# Patient Record
Sex: Male | Born: 1945
Health system: Southern US, Community
[De-identification: ages and names within clinical notes are randomized; demographics above are authoritative.]

## PROBLEM LIST (undated history)

## (undated) DIAGNOSIS — I219 Acute myocardial infarction, unspecified: Secondary | ICD-10-CM

## (undated) DIAGNOSIS — Z8669 Personal history of other diseases of the nervous system and sense organs: Secondary | ICD-10-CM

## (undated) DIAGNOSIS — I77819 Aortic ectasia, unspecified site: Secondary | ICD-10-CM

## (undated) DIAGNOSIS — M5136 Other intervertebral disc degeneration, lumbar region: Secondary | ICD-10-CM

## (undated) DIAGNOSIS — I739 Peripheral vascular disease, unspecified: Secondary | ICD-10-CM

## (undated) DIAGNOSIS — Z9989 Dependence on other enabling machines and devices: Secondary | ICD-10-CM

## (undated) DIAGNOSIS — E781 Pure hyperglyceridemia: Secondary | ICD-10-CM

## (undated) DIAGNOSIS — I255 Ischemic cardiomyopathy: Secondary | ICD-10-CM

## (undated) DIAGNOSIS — G4730 Sleep apnea, unspecified: Secondary | ICD-10-CM

## (undated) DIAGNOSIS — I2510 Atherosclerotic heart disease of native coronary artery without angina pectoris: Secondary | ICD-10-CM

## (undated) DIAGNOSIS — I4819 Other persistent atrial fibrillation: Secondary | ICD-10-CM

## (undated) DIAGNOSIS — M51369 Other intervertebral disc degeneration, lumbar region without mention of lumbar back pain or lower extremity pain: Secondary | ICD-10-CM

## (undated) DIAGNOSIS — N200 Calculus of kidney: Secondary | ICD-10-CM

## (undated) DIAGNOSIS — K219 Gastro-esophageal reflux disease without esophagitis: Secondary | ICD-10-CM

## (undated) DIAGNOSIS — I6529 Occlusion and stenosis of unspecified carotid artery: Secondary | ICD-10-CM

## (undated) DIAGNOSIS — M109 Gout, unspecified: Secondary | ICD-10-CM

## (undated) DIAGNOSIS — N529 Male erectile dysfunction, unspecified: Secondary | ICD-10-CM

## (undated) DIAGNOSIS — Z87442 Personal history of urinary calculi: Secondary | ICD-10-CM

## (undated) DIAGNOSIS — I340 Nonrheumatic mitral (valve) insufficiency: Secondary | ICD-10-CM

## (undated) DIAGNOSIS — M1990 Unspecified osteoarthritis, unspecified site: Secondary | ICD-10-CM

## (undated) DIAGNOSIS — E782 Mixed hyperlipidemia: Secondary | ICD-10-CM

## (undated) DIAGNOSIS — R0789 Other chest pain: Secondary | ICD-10-CM

## (undated) DIAGNOSIS — G459 Transient cerebral ischemic attack, unspecified: Secondary | ICD-10-CM

## (undated) DIAGNOSIS — K635 Polyp of colon: Secondary | ICD-10-CM

## (undated) DIAGNOSIS — I10 Essential (primary) hypertension: Secondary | ICD-10-CM

## (undated) DIAGNOSIS — I482 Chronic atrial fibrillation, unspecified: Secondary | ICD-10-CM

## (undated) DIAGNOSIS — F5104 Psychophysiologic insomnia: Secondary | ICD-10-CM

## (undated) DIAGNOSIS — N400 Enlarged prostate without lower urinary tract symptoms: Secondary | ICD-10-CM

## (undated) DIAGNOSIS — G4733 Obstructive sleep apnea (adult) (pediatric): Secondary | ICD-10-CM

## (undated) DIAGNOSIS — N183 Chronic kidney disease, stage 3 unspecified: Secondary | ICD-10-CM

## (undated) DIAGNOSIS — I499 Cardiac arrhythmia, unspecified: Secondary | ICD-10-CM

## (undated) HISTORY — DX: Ischemic cardiomyopathy: I25.5

## (undated) HISTORY — DX: Chronic atrial fibrillation, unspecified: I48.20

## (undated) HISTORY — DX: Aortic ectasia, unspecified site: I77.819

## (undated) HISTORY — DX: Gout, unspecified: M10.9

## (undated) HISTORY — DX: Polyp of colon: K63.5

## (undated) HISTORY — DX: Atherosclerotic heart disease of native coronary artery without angina pectoris: I25.10

## (undated) HISTORY — DX: Nonrheumatic mitral (valve) insufficiency: I34.0

## (undated) HISTORY — DX: Peripheral vascular disease, unspecified: I73.9

## (undated) HISTORY — DX: Occlusion and stenosis of unspecified carotid artery: I65.29

## (undated) HISTORY — DX: Obstructive sleep apnea (adult) (pediatric): G47.33

## (undated) HISTORY — DX: Other intervertebral disc degeneration, lumbar region without mention of lumbar back pain or lower extremity pain: M51.369

## (undated) HISTORY — DX: Psychophysiologic insomnia: F51.04

## (undated) HISTORY — DX: Calculus of kidney: N20.0

## (undated) HISTORY — PX: CARDIOVERSION: SHX1299

## (undated) HISTORY — DX: Pure hyperglyceridemia: E78.1

## (undated) HISTORY — PX: KIDNEY STONE SURGERY: SHX686

## (undated) HISTORY — DX: Other chest pain: R07.89

## (undated) HISTORY — DX: Unspecified osteoarthritis, unspecified site: M19.90

## (undated) HISTORY — DX: Essential (primary) hypertension: I10

## (undated) HISTORY — DX: Other intervertebral disc degeneration, lumbar region: M51.36

## (undated) HISTORY — PX: TONSILLECTOMY: SUR1361

## (undated) HISTORY — DX: Gastro-esophageal reflux disease without esophagitis: K21.9

## (undated) HISTORY — PX: FINGER SURGERY: SHX640

## (undated) HISTORY — DX: Male erectile dysfunction, unspecified: N52.9

## (undated) HISTORY — DX: Dependence on other enabling machines and devices: Z99.89

## (undated) HISTORY — DX: Other persistent atrial fibrillation: I48.19

## (undated) HISTORY — DX: Mixed hyperlipidemia: E78.2

---

## 1997-08-30 ENCOUNTER — Other Ambulatory Visit: Admission: RE | Admit: 1997-08-30 | Discharge: 1997-08-30 | Payer: Self-pay | Admitting: Internal Medicine

## 1999-05-09 ENCOUNTER — Ambulatory Visit (HOSPITAL_BASED_OUTPATIENT_CLINIC_OR_DEPARTMENT_OTHER): Admission: RE | Admit: 1999-05-09 | Discharge: 1999-05-09 | Payer: Self-pay | Admitting: Orthopedic Surgery

## 1999-06-04 ENCOUNTER — Emergency Department (HOSPITAL_COMMUNITY): Admission: EM | Admit: 1999-06-04 | Discharge: 1999-06-04 | Payer: Self-pay | Admitting: Emergency Medicine

## 1999-06-04 ENCOUNTER — Encounter: Payer: Self-pay | Admitting: Emergency Medicine

## 2000-09-23 ENCOUNTER — Encounter (INDEPENDENT_AMBULATORY_CARE_PROVIDER_SITE_OTHER): Payer: Self-pay

## 2000-09-23 ENCOUNTER — Ambulatory Visit (HOSPITAL_COMMUNITY): Admission: RE | Admit: 2000-09-23 | Discharge: 2000-09-23 | Payer: Self-pay | Admitting: Gastroenterology

## 2001-10-27 ENCOUNTER — Ambulatory Visit (HOSPITAL_COMMUNITY)
Admission: RE | Admit: 2001-10-27 | Discharge: 2001-10-27 | Payer: Self-pay | Admitting: Physical Medicine and Rehabilitation

## 2001-10-27 ENCOUNTER — Encounter: Payer: Self-pay | Admitting: Physical Medicine and Rehabilitation

## 2004-01-31 ENCOUNTER — Emergency Department (HOSPITAL_COMMUNITY): Admission: EM | Admit: 2004-01-31 | Discharge: 2004-01-31 | Payer: Self-pay | Admitting: *Deleted

## 2004-01-31 IMAGING — CT CT ABDOMEN W/O CM
1 series · 15 of 32 positions shown, 19 images · non-contrast
Comparison: None. 
CT OF THE ABDOMEN WITHOUT CONTRAST:

CLINICAL DATA: Abdominal and pelvic pain, left flank pain.
TECHNIQUE: Multidetector helical CT scanning obtained through the abdomen and pelvis.

[Series 2: routine abdomen · axial · 0.73mm/px · z∈[-484,-24]mm · 15 of 103 slices shown, 19 images]
[im 7/103  soft-tissue]
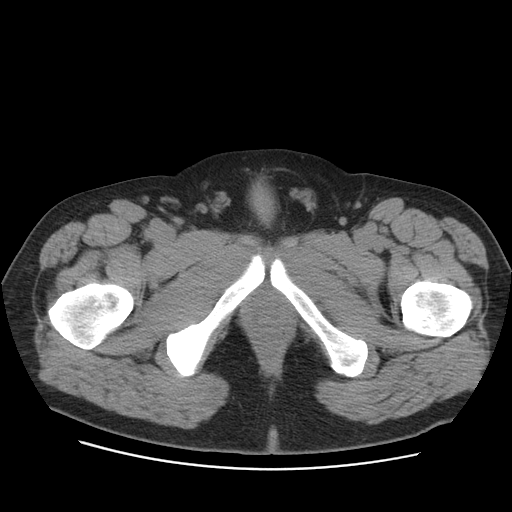
[im 7/103  bone]
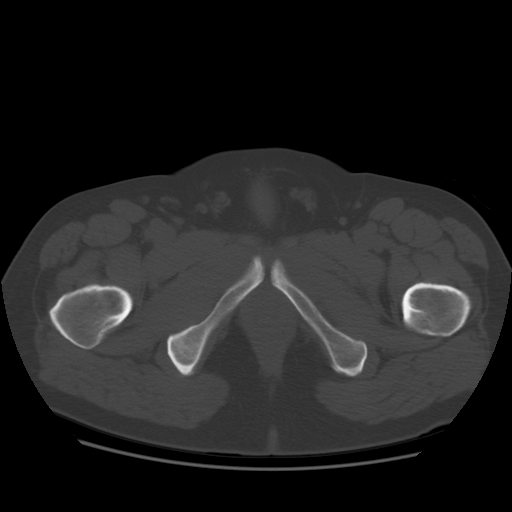
[im 14/103  soft-tissue]
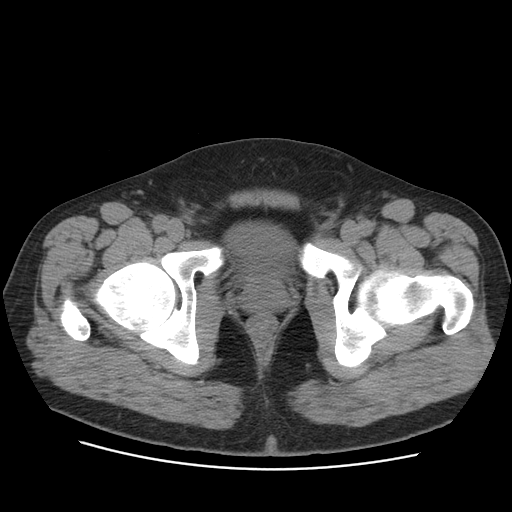
[im 20/103  soft-tissue]
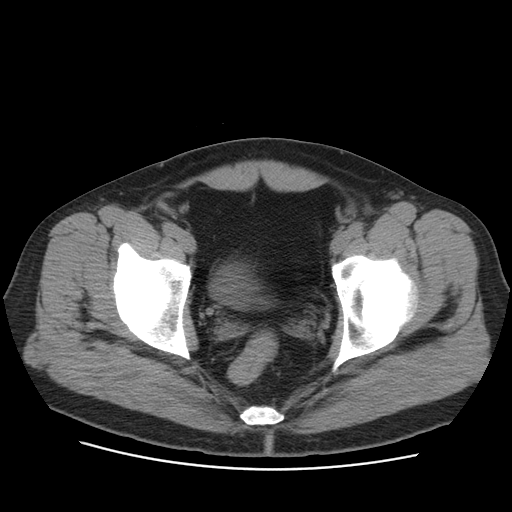
[im 30/103  soft-tissue]
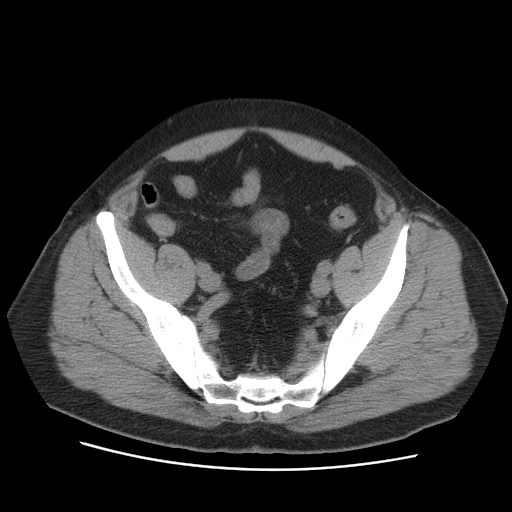
[im 37/103  soft-tissue]
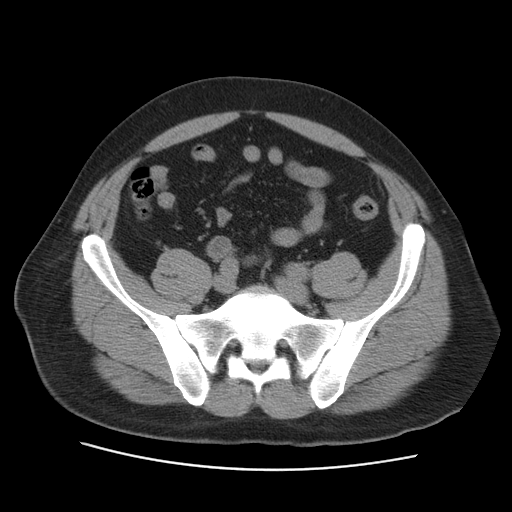
[im 43/103  soft-tissue]
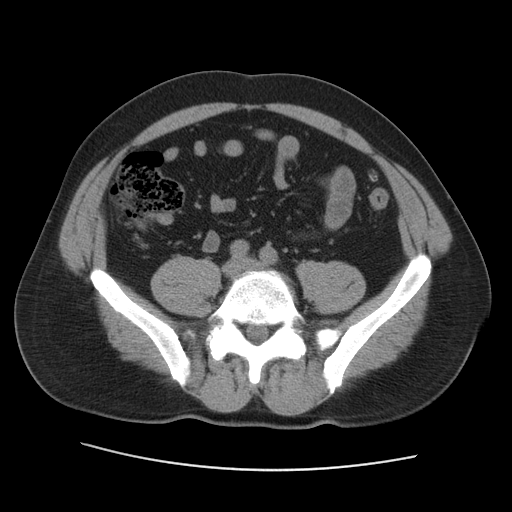
[im 53/103  soft-tissue]
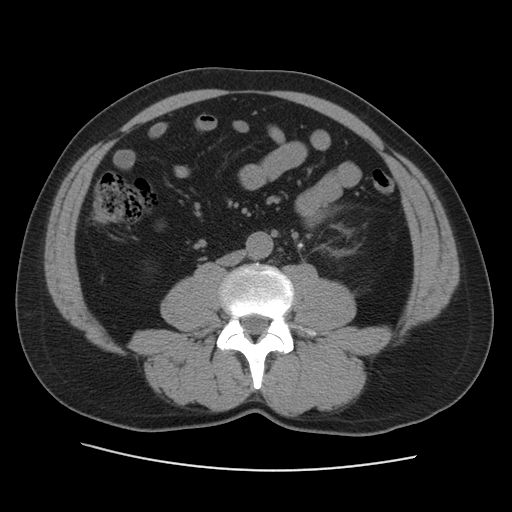
[im 60/103  soft-tissue]
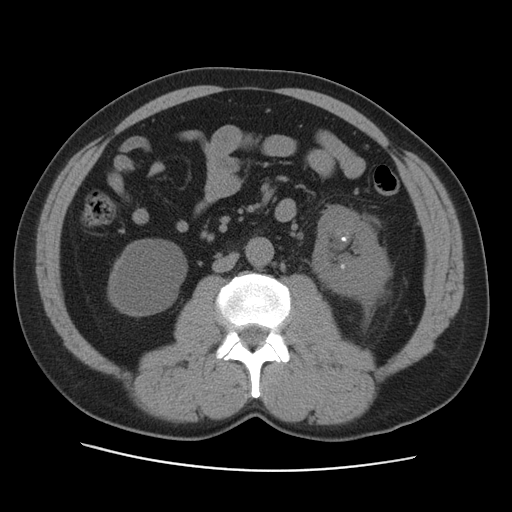
[im 66/103  soft-tissue]
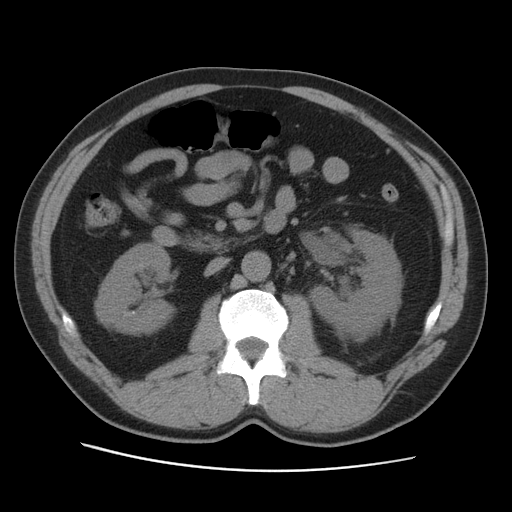
[im 66/103  bone]
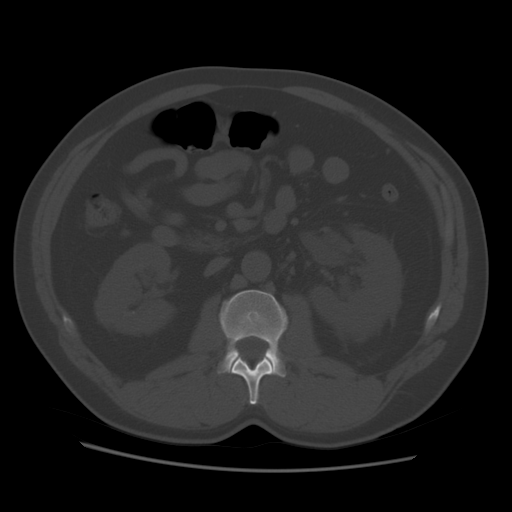
[im 73/103  soft-tissue]
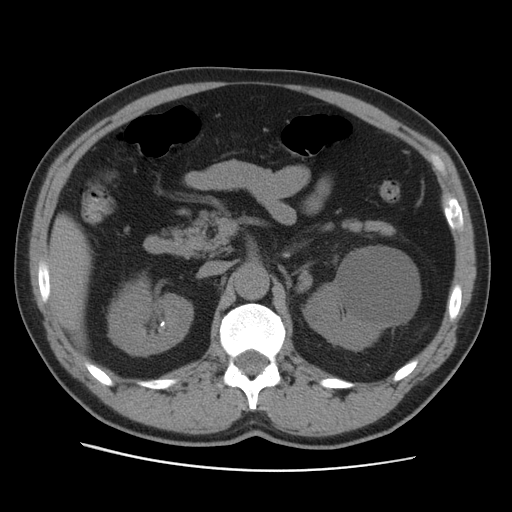
[im 83/103  soft-tissue]
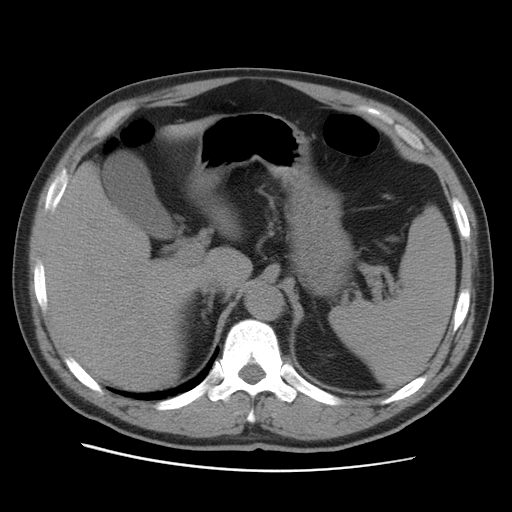
[im 89/103  soft-tissue]
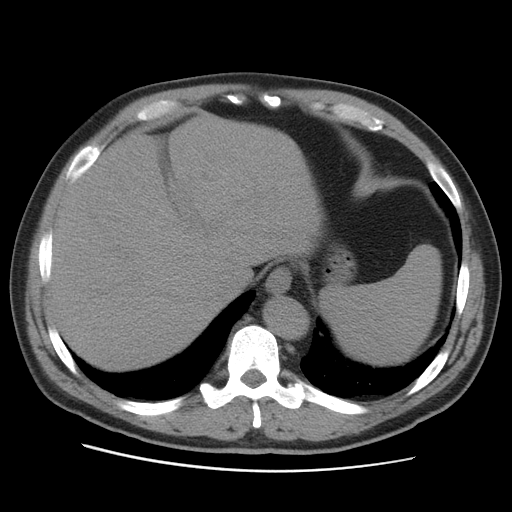
[im 89/103  lung]
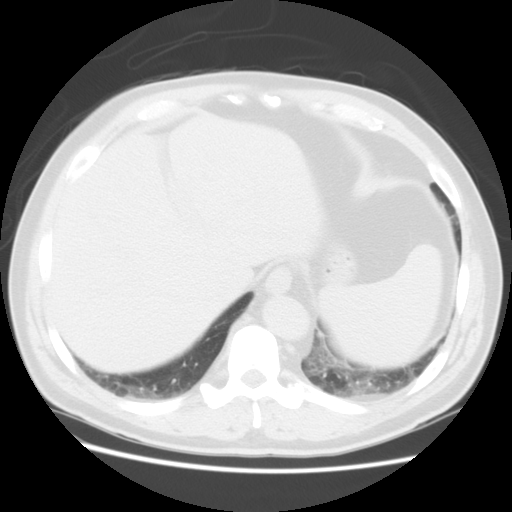
[im 93/103  lung]
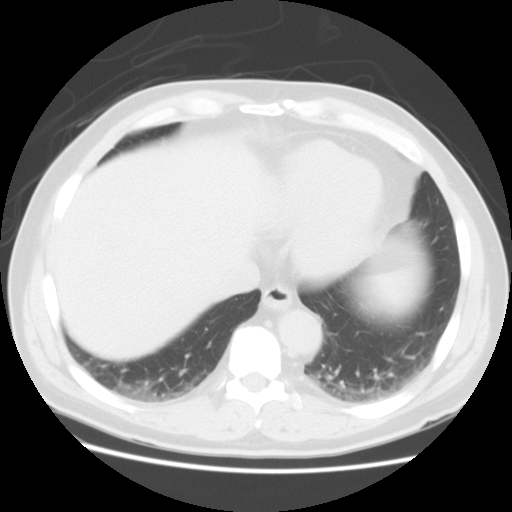
[im 96/103  soft-tissue]
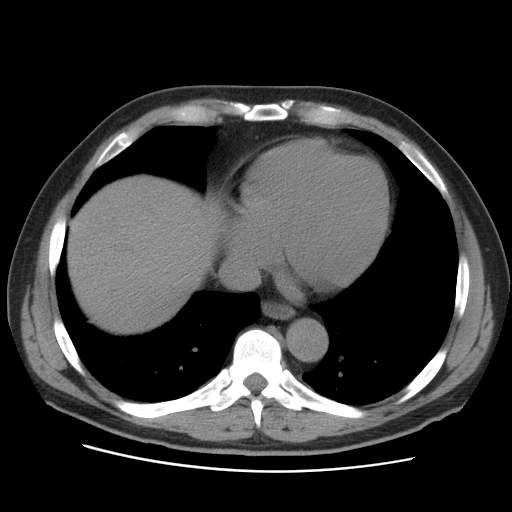
[im 96/103  lung]
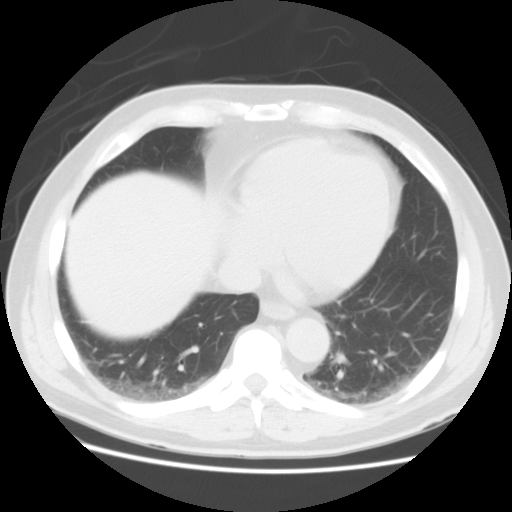
[im 99/103  lung]
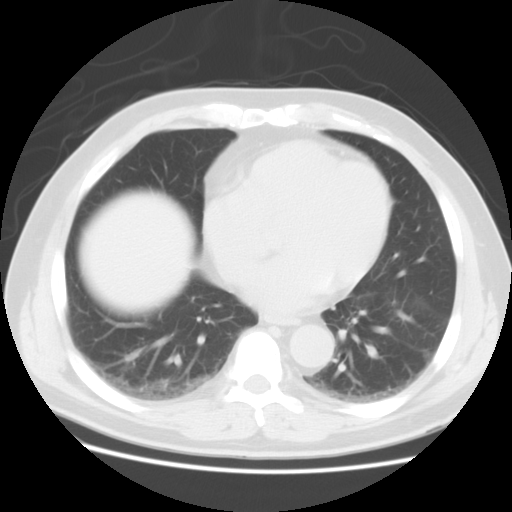

[15 of 32 positions shown; findings below may reference images not displayed]

FINDINGS: Mild bibasilar atelectasis is identified.  There is moderate left hydronephrosis with perinephric stranding caused by a 3 x 4 mm left UPJ calculus.  Multiple bilateral non-obstructing intrarenal calculi are noted as well as bilateral low density renal lesions probably representing cysts.  The largest lesions measure 6.5 cm in the left upper kidney and 6.5 x 5 cm in the right lower kidney.  The pancreas, spleen, adrenal glands, liver, and gallbladder are unremarkable.  No evidence of free fluid, enlarged lymph nodes or abdominal aortic aneurysm.  The visualized bowel is unremarkable.  Please note that parenchymal abnormalities may be missed as intravenous contrast was not administered.  Visualized bones unremarkable.
IMPRESSION: 1.   3 x 4 mm left UPJ calculus causing moderate left hydronephrosis.  
2.  Bilateral non-obstructing intrarenal calculi and probable bilateral renal cysts. 
CT OF THE PELVIS WITHOUT CONTRAST:
Visualized bowel and bladder are unremarkable.  No evidence of free fluid, enlarged lymph nodes or masses.  Slight increased density in the bladder probably represents blood.
IMPRESSION: No acute abnormality.

## 2004-02-04 ENCOUNTER — Emergency Department (HOSPITAL_COMMUNITY): Admission: EM | Admit: 2004-02-04 | Discharge: 2004-02-04 | Payer: Self-pay | Admitting: Emergency Medicine

## 2005-05-01 ENCOUNTER — Encounter: Admission: RE | Admit: 2005-05-01 | Discharge: 2005-05-01 | Payer: Self-pay | Admitting: Internal Medicine

## 2005-05-01 IMAGING — US US ABDOMEN COMPLETE
1 series · 13 of 25 positions shown · non-contrast
Comparison: [HOSPITAL] abdominopelvic CT urogram, [DATE].

CLINICAL DATA: Chest, right upper quadrant abdominal pain.
 ABDOMEN ULTRASOUND:
TECHNIQUE: Complete abdominal ultrasound examination was performed including evaluation of the liver, gallbladder, bile ducts, pancreas, kidneys, spleen, IVC, and abdominal aorta.  Study is limited due to body habitus and gastrointestinal gas obscuration.

[Series 1: us abdomen complete · 0.32mm/px · 13 of 109 slices shown]
[im 1/109]
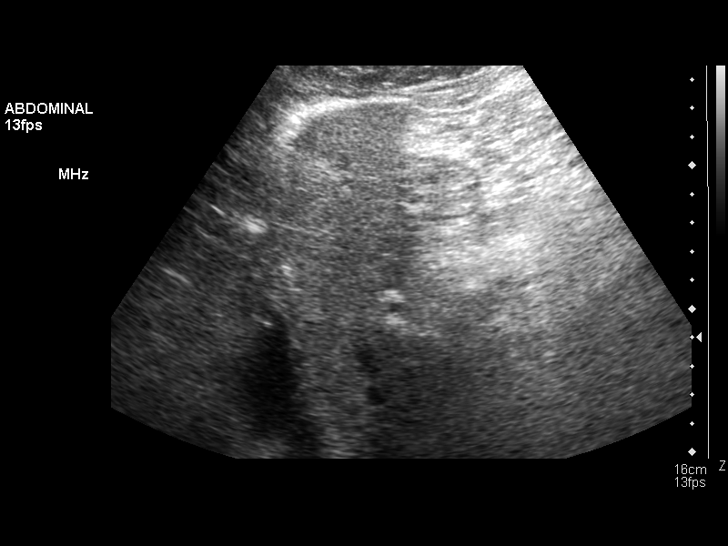
[im 10/109]
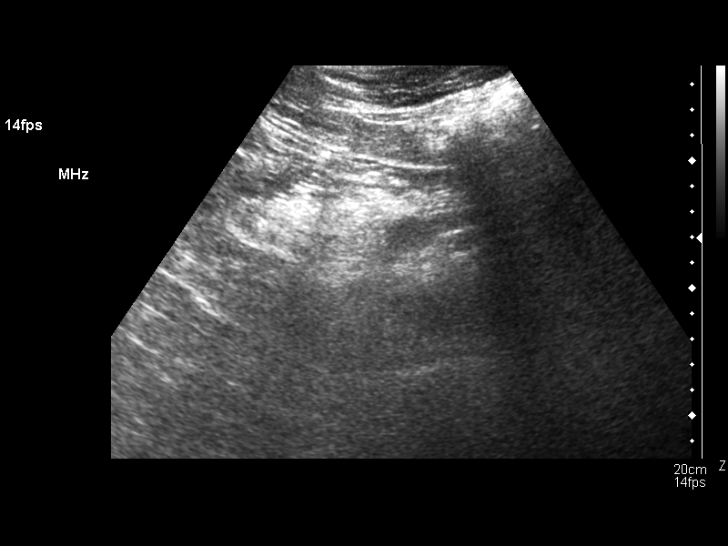
[im 19/109]
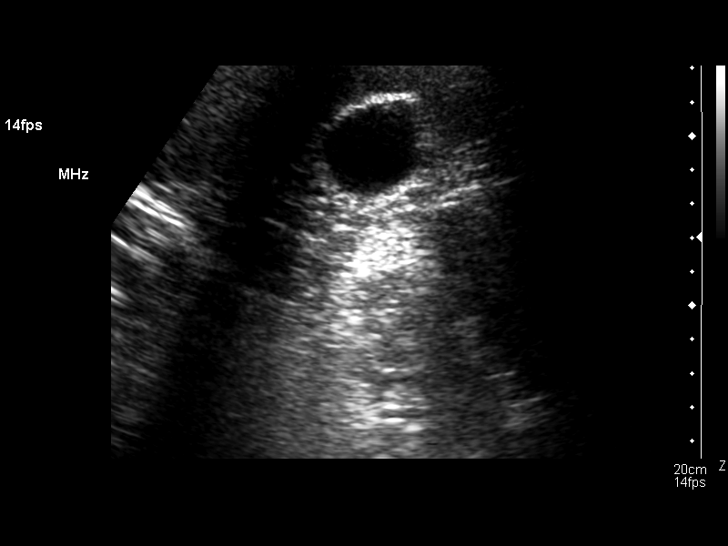
[im 28/109]
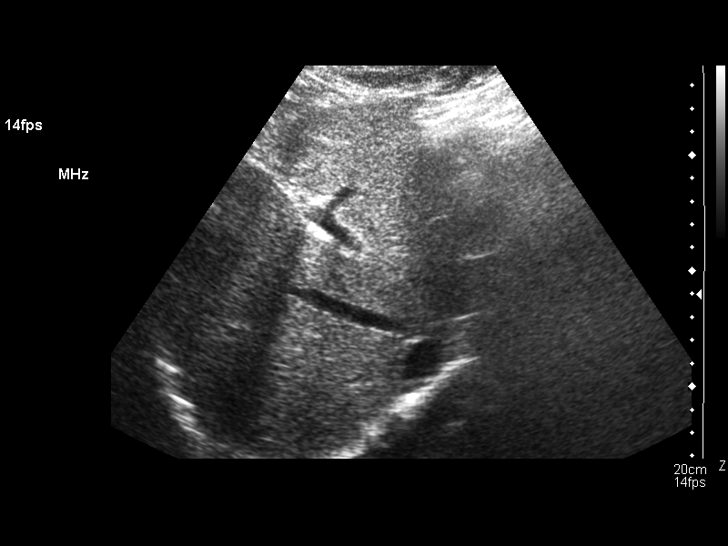
[im 37/109]
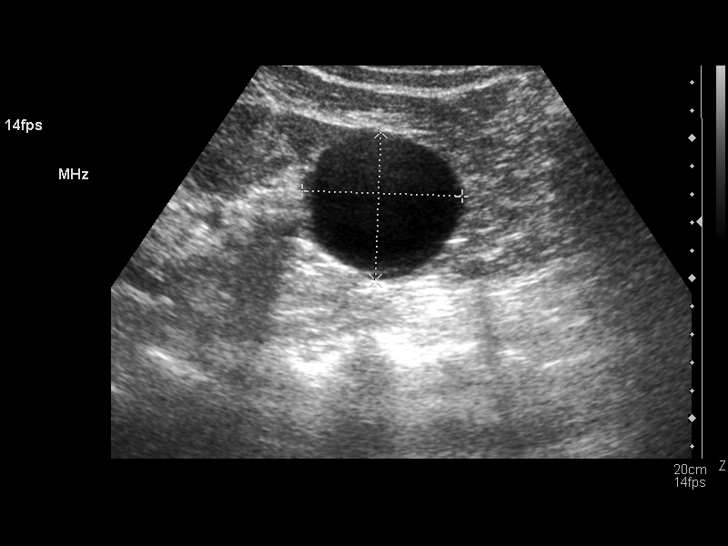
[im 46/109]
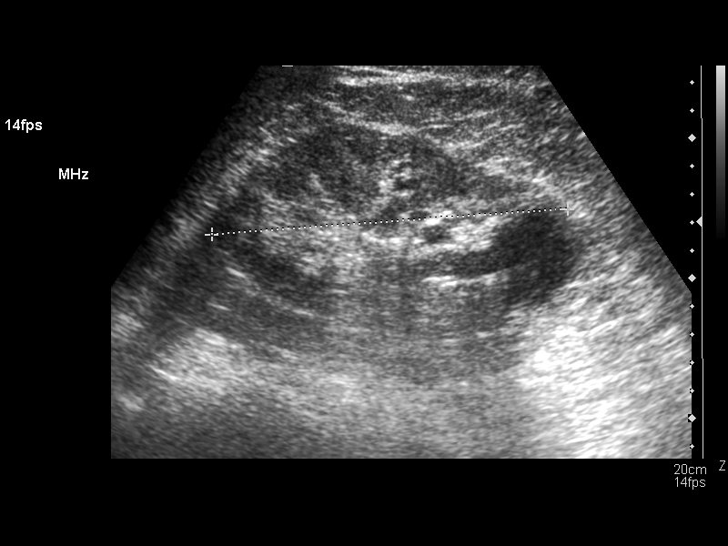
[im 55/109]
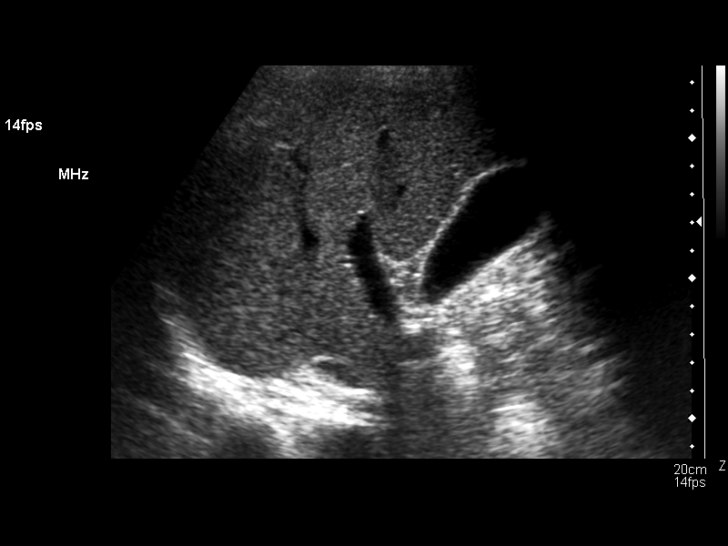
[im 64/109]
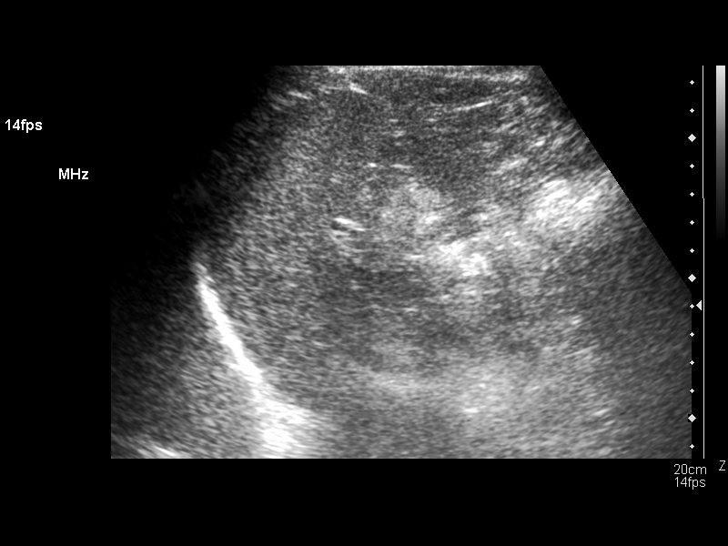
[im 73/109]
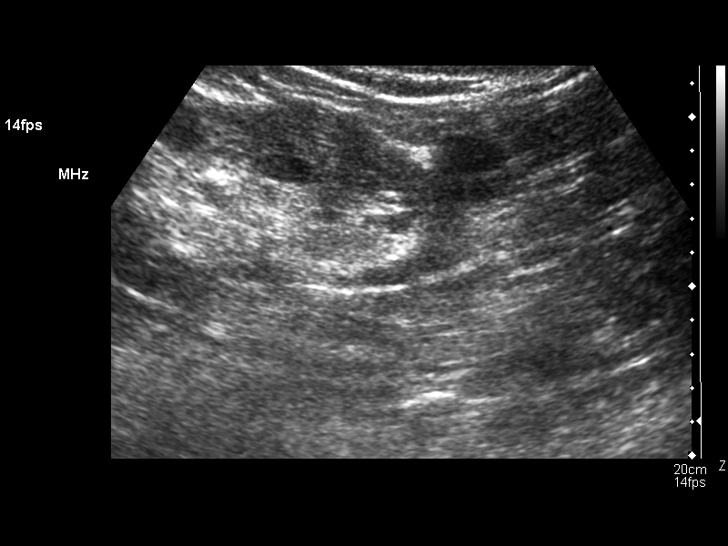
[im 82/109]
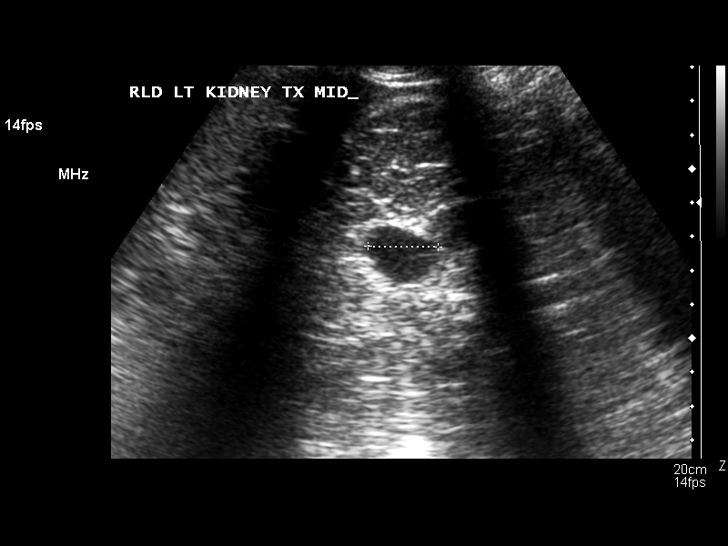
[im 91/109]
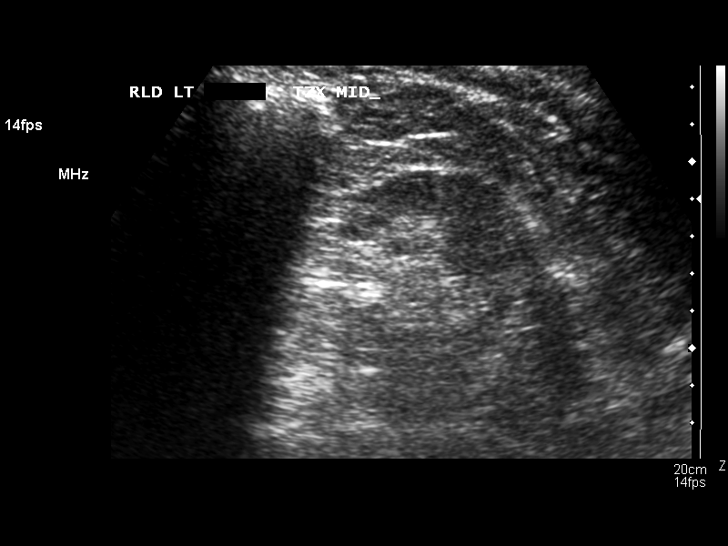
[im 100/109]
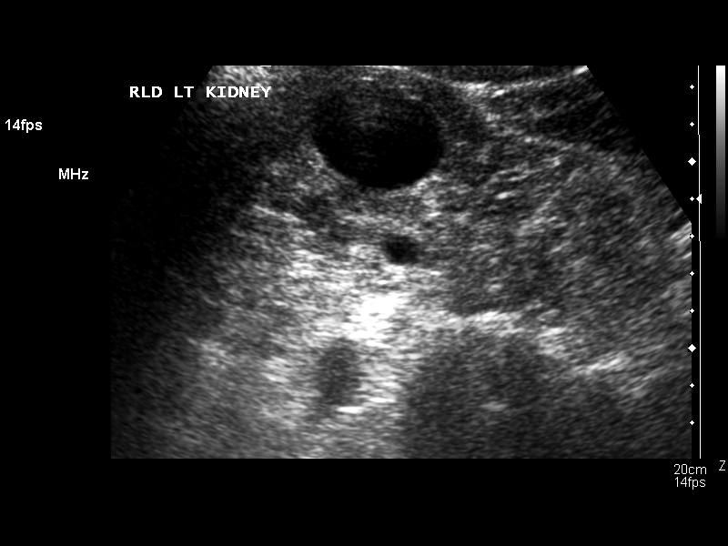
[im 109/109]
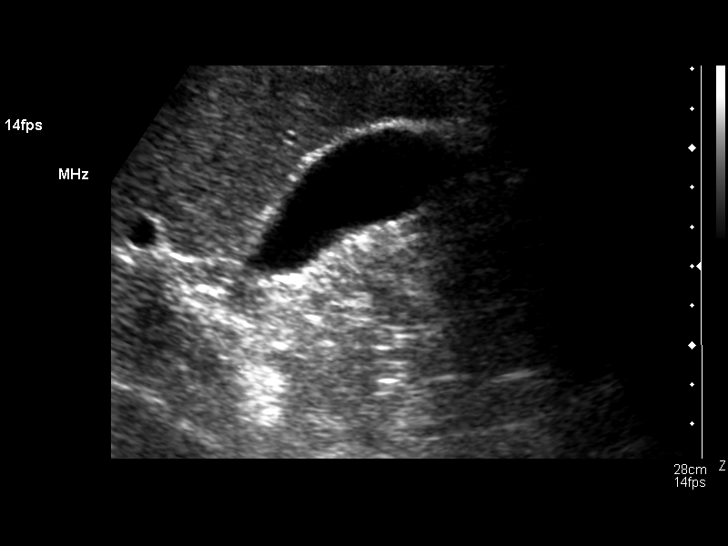

[13 of 25 positions shown; findings below may reference images not displayed]

FINDINGS: Gallbladder is sonographically normal without sludge nor gallstones with normal wall thickness of 3 mm.  No dilated intrahepatic nor extrahepatic bile ducts are seen with the common bile duct measuring normally at 3 mm.  No change in slight increased diffuse echogenicity of fatty change with no focal liver lesions noted.  Inferior vena cava and pancreas are limited due to overlying gastrointestinal gas.  Spleen is sonographically normal measuring 7.8 cm long.  Mid abdominal aorta is visualized with superior and distal obscured by intestinal gas.  Maximum diameter at the midportion is normal at 2.2 cm.  Right kidney measures 14.5 cm long with the left kidney measuring 12 cm long with no hydronephrosis.  No change in nonobstructing bilateral renal calculi noted as well as bilateral renal cysts.  The largest at the lower pole right measures 5.7 cm long by 5.3 cm AP by 6.4 cm wide and the largest at the upper pole left measures up to 3.4 cm.
IMPRESSION: 1.  No change in bilateral nonobstructing renal calculi and cysts.
 2.  Technical limitations with special limitation of pancreas and inferior vena cava assessment.
 3.  Slight diffuse fatty infiltration of liver.
 4.  Otherwise negative.

## 2005-05-22 ENCOUNTER — Encounter: Admission: RE | Admit: 2005-05-22 | Discharge: 2005-05-22 | Payer: Self-pay | Admitting: Internal Medicine

## 2005-05-22 IMAGING — NM NM HEPATO W/GB/PHARM/[PERSON_NAME]
4 series · 4 of 4 positions shown · non-contrast
Comparison: [DATE] abdominal ultrasound.

CLINICAL DATA: Right upper quadrant abdominal pain. 
NUCLEAR MEDICINE HEPATOBILIARY SCAN WITH EJECTION FRACTION:
TECHNIQUE: Sequential abdominal images were obtained following intravenous injection of radiopharmaceutical.  Sequential images were continued following oral ingestion of 8 ounces of half-and-half and the gallbladder ejection fraction was calculated.
Radiopharmaceutical:  5 mCi of [HS] Choletec.

[gb hepatobiliary · 1 of 1 slices shown (1 of 4)]
[im 1/1]
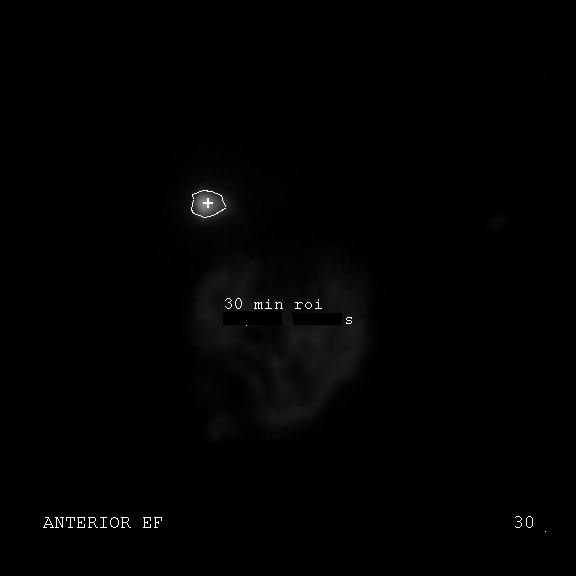

[gb hepatobiliary · 1 of 1 slices shown (2 of 4)]
[im 1/1]
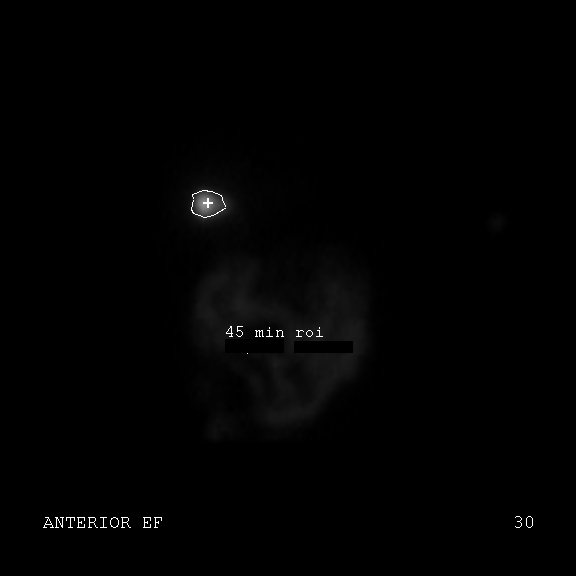

[gb hepatobiliary · 1 of 1 slices shown (3 of 4)]
[im 1/1]
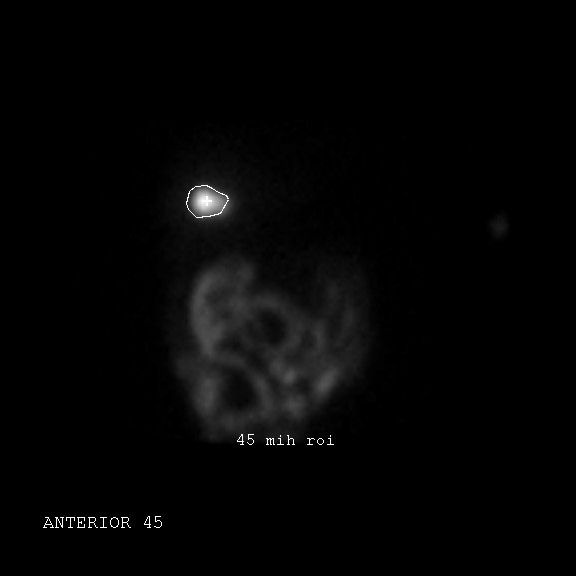

[gb hepatobiliary · 1 of 1 slices shown (4 of 4)]
[im 1/1]
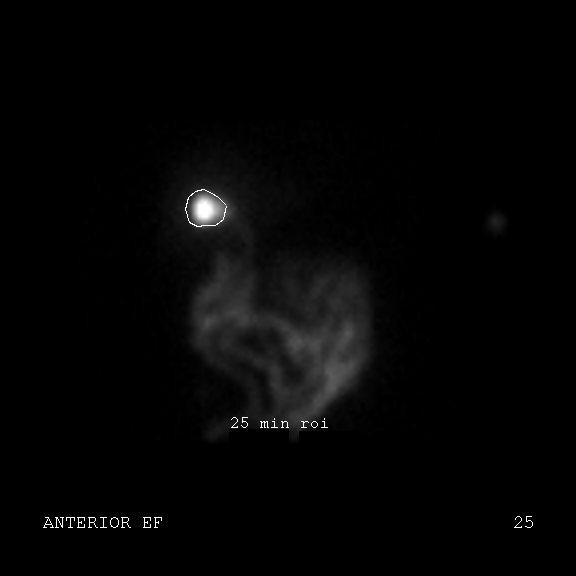

[4 of 4 positions shown; findings below may reference images not displayed]

FINDINGS: There is normal hepatic uptake of the radiotracer with biliary excretion and gallbladder accumulation beginning at 10 minutes.  The gallbladder continues to accumulate radiotracer and, after 60 minutes, gallbladder stimulation was performed with 8 ounces of half-and-half cream orally. The gallbladder ejection fraction was calculated at 45 minutes which was 76%.  This is within normal limits.
IMPRESSION: 1.  Normal hepatobiliary scan.
2.  Normal gallbladder ejection fraction of 76%.
3.  No biliary obstruction.

## 2005-06-19 ENCOUNTER — Ambulatory Visit (HOSPITAL_BASED_OUTPATIENT_CLINIC_OR_DEPARTMENT_OTHER): Admission: RE | Admit: 2005-06-19 | Discharge: 2005-06-19 | Payer: Self-pay | Admitting: Urology

## 2009-10-01 DIAGNOSIS — K635 Polyp of colon: Secondary | ICD-10-CM

## 2009-10-01 HISTORY — DX: Polyp of colon: K63.5

## 2009-11-09 ENCOUNTER — Ambulatory Visit (HOSPITAL_COMMUNITY): Admission: RE | Admit: 2009-11-09 | Discharge: 2009-11-09 | Payer: Self-pay | Admitting: Urology

## 2009-11-09 IMAGING — CR DG HIP COMPLETE 2+V*R*
4 series · 4 of 4 positions shown · non-contrast
Comparison: None

CLINICAL DATA: Right-sided pelvic pain.

RIGHT HIP - COMPLETE 2+ VIEW

[t pelvis a.p. (1 of 2)]
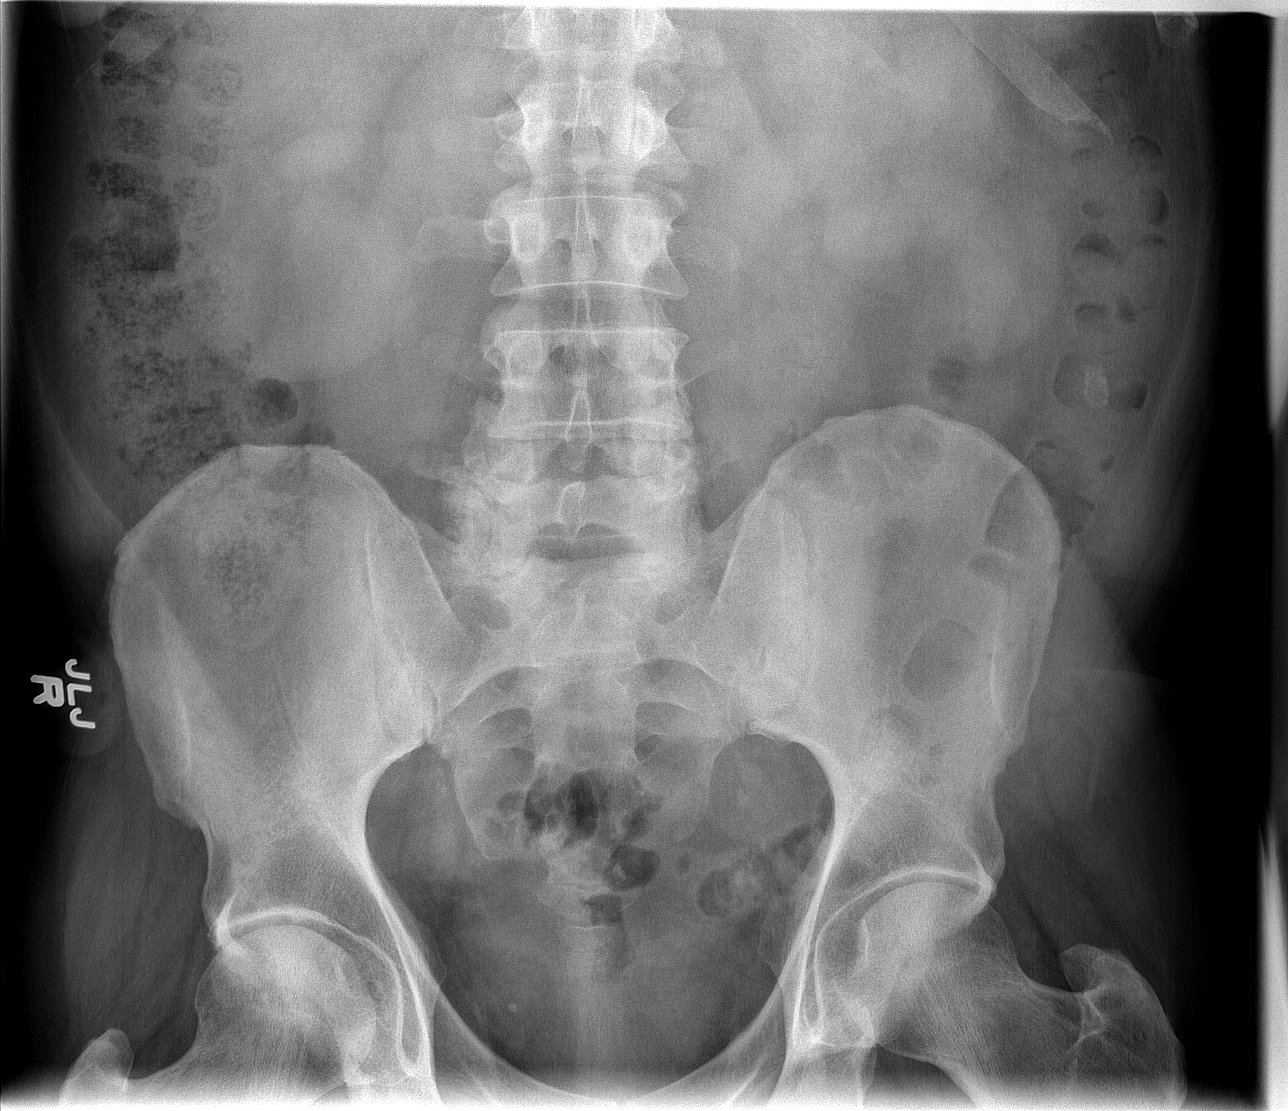

[t pelvis a.p. (2 of 2)]
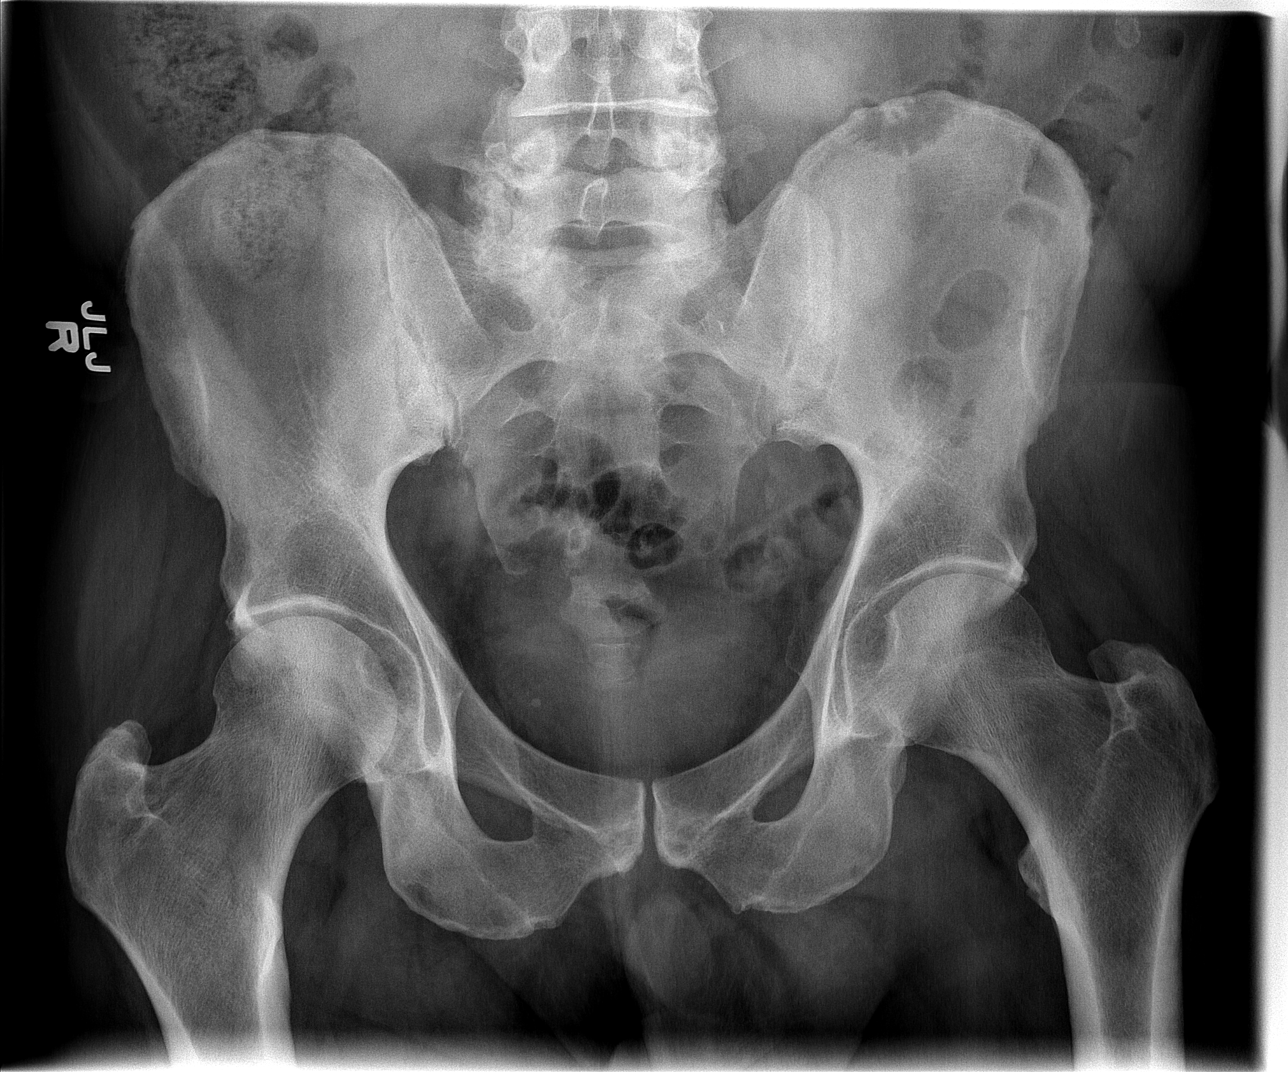

[t hip ap right]
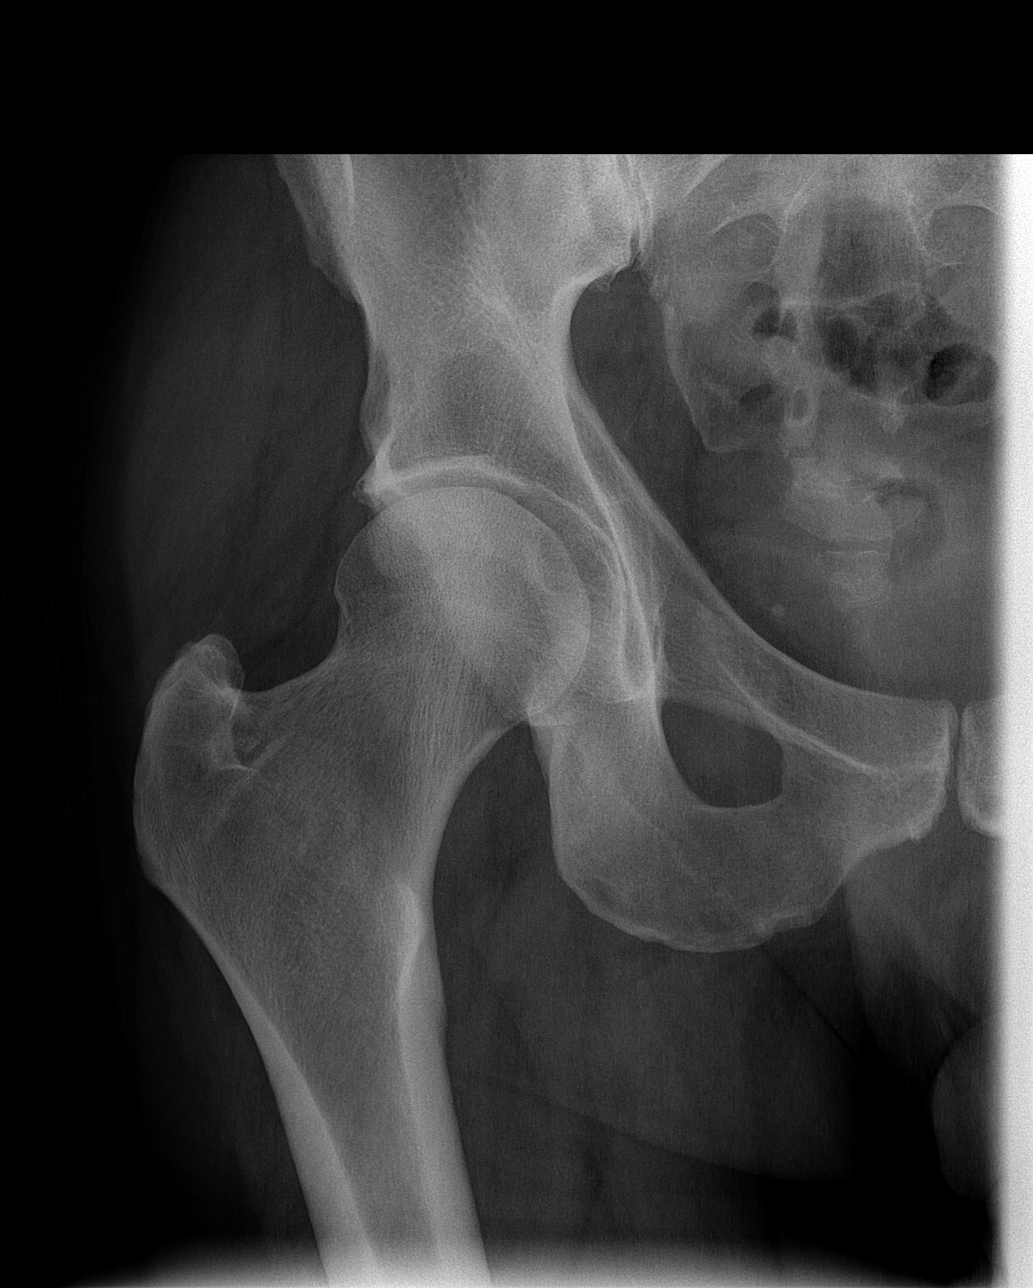

[t hip frog leg right]
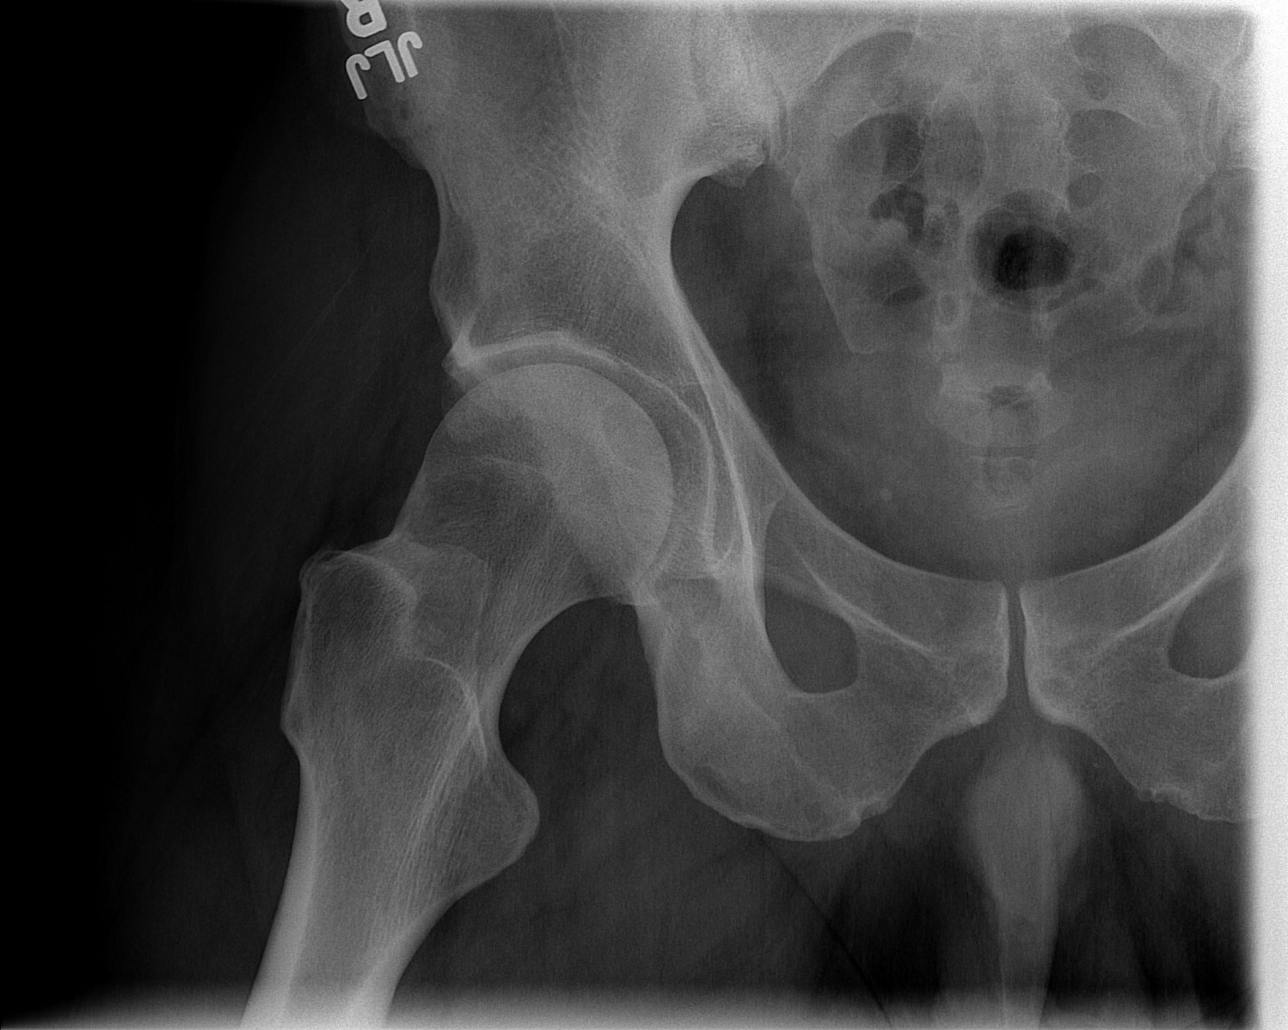

[4 of 4 positions shown; findings below may reference images not displayed]

FINDINGS: Both hips are normally located.  No acute bony findings
or significant degenerative changes.  The pubic symphysis and SI
joints are intact.  No plain film findings for avascular necrosis.
Degenerative changes are noted in the lower lumbar spine with
moderate facet disease.
IMPRESSION: No acute bony findings or significant degenerative changes
involving the hips.

## 2010-01-22 ENCOUNTER — Encounter: Payer: Self-pay | Admitting: Internal Medicine

## 2010-05-19 NOTE — Op Note (Signed)
[facility]  Patient:    Sean Willis, Sean Willis                     MRN: [id number] Proc. Date: 05/09/99 Attending:  Nicki Reaper, M.D. CC:         Two copies to Dr. Merlyn Lot                           Operative Report  PREOPERATIVE DIAGNOSIS:  Mass, right ring finger.  POSTOPERATIVE DIAGNOSIS:  Mass, right ring finger.  OPERATION:  Excision of mass, right ring finger.  SURGEON:  Nicki Reaper, M.D.  ASSISTANT:  Joaquin Courts, R.N.  ANESTHESIA: Foreign based IV regional.  ANESTHESIOLOGIST:  Cliffton Asters. Ivin Booty, M.D.  HISTORY: The patient is a 65 year old male with a mass on the right ring finger.  This has been gradually enlarging.  He is desirous of removal.  DESCRIPTION OF PROCEDURE:  The patient was brought to the operating room where foreign based IV regional anesthetic was carried out without difficulty.  The knee was prepped and draped using Betadine scrub and solution with the right arm free. A mid lateral incision was made, carried down through the subcutaneous tissues. Bleeders were electrocauterized. The dissection carried down to the mass which was immediately apparent.  This was found to go below the extensor tendon.  The neurovascular structures were protected.  The mass was down to bone with an area of scalping secondary to pressure in the bone. With blunt and sharp dissection this was dissected free.  Feeding vessels were cauterized.  It appeared to be a hemangioma type mass with multiple blood filled sacs. The specimen was sent to pathology.  No further lesions were identified.  The wound was irrigated.  The skin was closed with interrupted 5-0 nylon sutures.  Sterile compressive dressing and splint was applied.  The patient tolerated the procedure well and was taken to the recovery room for observation in satisfactory condition.  He is discharged home to return to the [facility] [place] in one week on Vicodin and Keflex. DD:   05/09/99 TD:  05/09/99 Job: [id number]

## 2010-05-19 NOTE — Procedures (Signed)
[facility]  Patient:    Sean Willis, Sean Willis Visit Number: [id number] MRN: [id number]          Service Type: Attending:  Verlin Grills, M.D. Dictated by:   Verlin Grills, M.D. Proc. Date: 09/23/00   CC:         Tyson Dense, M.D.   Procedure Report  PROCEDURE:  Colonoscopy and polypectomy.  REFERRING PHYSICIAN:  Tyson Dense, M.D.  INDICATIONS FOR PROCEDURE:  The patient (date of birth [birth year]) is a 65 year old male undergoing surveillance colonoscopy with polypectomy to prevent colon cancer.  ENDOSCOPIST:  Verlin Grills, M.D.  PREMEDICATION:  Versed 7 mg and Demerol 50 mg.  ENDOSCOPE:  Olympus pediatric colonoscope.  DESCRIPTION OF PROCEDURE:  After obtaining informed consent, the patient was placed in the left lateral decubitus position.  I administered intravenous Demerol and intravenous Versed to achieve conscious sedation for the procedure.  The patients blood pressure, oxygen saturation, and cardiac rhythm were monitored throughout the procedure, and documented in the medical record.  Anal inspection was normal.  Digital rectal exam revealed a non-nodular prostate.  The Olympus pediatric video colonoscope was introduced into the rectum, and easily advanced to the cecum.  The colonic preparation for the exam today was excellent.  Rectum:  From the distal rectum, a 1 mm sessile polyp was removed with the cold biopsy forceps, and submitted to pathology for interpretation.  Sigmoid colon and descending colon normal.  Splenic flexure normal.  Transverse colon normal.  Hepatic flexure normal.  Ascending colon normal.  Cecum and ileocecal valve normal.  ASSESSMENT:  A 1 mm sessile polyp was removed from the distal rectum; otherwise normal proctocolonoscopy to the cecum. Dictated by:   Verlin Grills, M.D. Attending:  Verlin Grills, M.D. DD:  09/23/00 TD:  09/23/00 Job:  [id number]

## 2010-05-19 NOTE — Op Note (Signed)
Sean Willis, Sean Willis            ACCOUNT NO.:  [id number]   MEDICAL RECORD NO.:  [id number]          PATIENT TYPE:  AMB   LOCATION:  [facility]                         FACILITY:  [facility]   PHYSICIAN:  Ronald L. Earlene Plater, M.D.  DATE OF BIRTH:  [birth year]   DATE OF PROCEDURE:  06/19/2005  DATE OF DISCHARGE:                                 OPERATIVE REPORT   DIAGNOSIS:  Right ureteral lithiasis.   OPERATIVE PROCEDURES:  1.  Cystourethroscopy.  2.  Right ureteroscopy with holmium laser lithotripsy and basket stone      extraction.  3.  Placement of double-J stent.   SURGEON:  Lucrezia Starch. Earlene Plater, M.D.   ANESTHESIA:  LMA.   ESTIMATED BLOOD LOSS:  Negligible.   TUBES:  26 cm 6 French contour double pigtail stent with a pullout string.   COMPLICATIONS:  None.   INDICATION FOR PROCEDURE:  [name] is a very nice 65 year old white  male who has had intermittent right flank pain for at least 3 months.  CT  scan revealed a right distal ureteral calculus and KUB confirmed that it was  there.  It is not moved, has not passed and although it was not causing  significant hydronephrosis it continues to cause pain.  After being there  for 3 months, it was felt to be approximately 4 mm in diameter and it was  felt that it probably was not going to pass.  After understanding risks,  benefits and alternatives, he elected to proceed with the above procedure.   PROCEDURE IN DETAIL:  The patient was placed in the supine position and  after proper LMA anesthesia was placed in the dorsal lithotomy position,  prepped and draped with Betadine in a sterile fashion.  A cystourethroscopy  was performed with a 22.5 [place] Olympus pan endoscope.  He had mild  trilobar hypertrophy, bladder was smooth-walled and efflux of clear urine  was noted bilaterally.  Under fluoroscopic guidance, a 0.038 French sensor  wire was placed in the right renal pelvis.  The distal ureter was dilated  with the dilating sheath of a  ureteral access catheter and ureteroscopy was  performed with a short thin ureteroscope up to the stone.  The stone  appeared to be very hard and shiny.  Utilizing the 365 micron laser fiber,  holmium laser with setting of 0.5 and a repetition rate of 5, the stone was  fragmented in several fragments which were extracted with nitinol basket and  will be submitted for stone analysis.  Re-inspection of the lower two-thirds  ureter revealed no further fragments were present.  Ureteroscope  was easily removed and under fluoroscopic guidance a 26 cm 6 French contour  double pigtail stent was placed and noted to be in good position within the  right renal pelvis within the bladder.  Pullout string was attached, the  bladder was drained.  The string was taped to the penis and the patient was  taken to the recovery room stable.      Ronald L. Earlene Plater, M.D.  Electronically Signed     RLD/MEDQ  D:  06/19/2005  T:  06/19/2005  Job:  [id number]

## 2011-01-30 DIAGNOSIS — N200 Calculus of kidney: Secondary | ICD-10-CM | POA: Diagnosis not present

## 2011-01-30 DIAGNOSIS — N529 Male erectile dysfunction, unspecified: Secondary | ICD-10-CM | POA: Diagnosis not present

## 2011-01-30 DIAGNOSIS — N401 Enlarged prostate with lower urinary tract symptoms: Secondary | ICD-10-CM | POA: Diagnosis not present

## 2011-04-02 DIAGNOSIS — J9600 Acute respiratory failure, unspecified whether with hypoxia or hypercapnia: Secondary | ICD-10-CM | POA: Diagnosis not present

## 2011-04-02 DIAGNOSIS — I4891 Unspecified atrial fibrillation: Secondary | ICD-10-CM | POA: Diagnosis not present

## 2011-04-02 DIAGNOSIS — R0789 Other chest pain: Secondary | ICD-10-CM | POA: Diagnosis not present

## 2011-04-02 DIAGNOSIS — J9819 Other pulmonary collapse: Secondary | ICD-10-CM | POA: Diagnosis not present

## 2011-04-02 DIAGNOSIS — R570 Cardiogenic shock: Secondary | ICD-10-CM | POA: Diagnosis not present

## 2011-04-02 DIAGNOSIS — D62 Acute posthemorrhagic anemia: Secondary | ICD-10-CM | POA: Diagnosis not present

## 2011-04-02 DIAGNOSIS — I129 Hypertensive chronic kidney disease with stage 1 through stage 4 chronic kidney disease, or unspecified chronic kidney disease: Secondary | ICD-10-CM | POA: Diagnosis present

## 2011-04-02 DIAGNOSIS — R0609 Other forms of dyspnea: Secondary | ICD-10-CM | POA: Diagnosis not present

## 2011-04-02 DIAGNOSIS — I2510 Atherosclerotic heart disease of native coronary artery without angina pectoris: Secondary | ICD-10-CM

## 2011-04-02 DIAGNOSIS — I2789 Other specified pulmonary heart diseases: Secondary | ICD-10-CM | POA: Diagnosis not present

## 2011-04-02 DIAGNOSIS — J95821 Acute postprocedural respiratory failure: Secondary | ICD-10-CM | POA: Diagnosis not present

## 2011-04-02 DIAGNOSIS — R0989 Other specified symptoms and signs involving the circulatory and respiratory systems: Secondary | ICD-10-CM | POA: Diagnosis not present

## 2011-04-02 DIAGNOSIS — G4733 Obstructive sleep apnea (adult) (pediatric): Secondary | ICD-10-CM | POA: Diagnosis present

## 2011-04-02 DIAGNOSIS — R079 Chest pain, unspecified: Secondary | ICD-10-CM | POA: Diagnosis not present

## 2011-04-02 DIAGNOSIS — I7090 Unspecified atherosclerosis: Secondary | ICD-10-CM | POA: Diagnosis not present

## 2011-04-02 DIAGNOSIS — I2109 ST elevation (STEMI) myocardial infarction involving other coronary artery of anterior wall: Secondary | ICD-10-CM | POA: Diagnosis not present

## 2011-04-02 DIAGNOSIS — G43909 Migraine, unspecified, not intractable, without status migrainosus: Secondary | ICD-10-CM | POA: Diagnosis present

## 2011-04-02 DIAGNOSIS — J988 Other specified respiratory disorders: Secondary | ICD-10-CM | POA: Diagnosis not present

## 2011-04-02 DIAGNOSIS — Z01818 Encounter for other preprocedural examination: Secondary | ICD-10-CM | POA: Diagnosis not present

## 2011-04-02 DIAGNOSIS — I2582 Chronic total occlusion of coronary artery: Secondary | ICD-10-CM | POA: Diagnosis not present

## 2011-04-02 DIAGNOSIS — I259 Chronic ischemic heart disease, unspecified: Secondary | ICD-10-CM | POA: Diagnosis not present

## 2011-04-02 DIAGNOSIS — I6529 Occlusion and stenosis of unspecified carotid artery: Secondary | ICD-10-CM | POA: Diagnosis not present

## 2011-04-02 DIAGNOSIS — I219 Acute myocardial infarction, unspecified: Secondary | ICD-10-CM | POA: Diagnosis not present

## 2011-04-02 DIAGNOSIS — N183 Chronic kidney disease, stage 3 unspecified: Secondary | ICD-10-CM | POA: Diagnosis not present

## 2011-04-02 DIAGNOSIS — K761 Chronic passive congestion of liver: Secondary | ICD-10-CM | POA: Diagnosis not present

## 2011-04-02 DIAGNOSIS — J309 Allergic rhinitis, unspecified: Secondary | ICD-10-CM | POA: Diagnosis present

## 2011-04-02 DIAGNOSIS — I519 Heart disease, unspecified: Secondary | ICD-10-CM | POA: Diagnosis not present

## 2011-04-02 DIAGNOSIS — Z01811 Encounter for preprocedural respiratory examination: Secondary | ICD-10-CM | POA: Diagnosis not present

## 2011-04-02 DIAGNOSIS — I2129 ST elevation (STEMI) myocardial infarction involving other sites: Secondary | ICD-10-CM | POA: Diagnosis not present

## 2011-04-02 HISTORY — PX: CORONARY ARTERY BYPASS GRAFT: SHX141

## 2011-04-02 HISTORY — DX: Atherosclerotic heart disease of native coronary artery without angina pectoris: I25.10

## 2011-04-16 DIAGNOSIS — I2510 Atherosclerotic heart disease of native coronary artery without angina pectoris: Secondary | ICD-10-CM | POA: Diagnosis not present

## 2011-04-16 DIAGNOSIS — I959 Hypotension, unspecified: Secondary | ICD-10-CM | POA: Diagnosis not present

## 2011-04-16 DIAGNOSIS — Z1331 Encounter for screening for depression: Secondary | ICD-10-CM | POA: Diagnosis not present

## 2011-04-16 DIAGNOSIS — I219 Acute myocardial infarction, unspecified: Secondary | ICD-10-CM | POA: Diagnosis not present

## 2011-04-19 DIAGNOSIS — N183 Chronic kidney disease, stage 3 unspecified: Secondary | ICD-10-CM | POA: Diagnosis not present

## 2011-04-19 DIAGNOSIS — I10 Essential (primary) hypertension: Secondary | ICD-10-CM | POA: Diagnosis not present

## 2011-04-26 DIAGNOSIS — I10 Essential (primary) hypertension: Secondary | ICD-10-CM | POA: Diagnosis not present

## 2011-04-26 DIAGNOSIS — E782 Mixed hyperlipidemia: Secondary | ICD-10-CM | POA: Diagnosis not present

## 2011-04-26 DIAGNOSIS — I2510 Atherosclerotic heart disease of native coronary artery without angina pectoris: Secondary | ICD-10-CM | POA: Diagnosis not present

## 2011-04-26 DIAGNOSIS — Z79899 Other long term (current) drug therapy: Secondary | ICD-10-CM | POA: Diagnosis not present

## 2011-05-03 ENCOUNTER — Encounter (HOSPITAL_COMMUNITY)
Admission: RE | Admit: 2011-05-03 | Discharge: 2011-05-03 | Disposition: A | Discharge status: Home / Self Care | Payer: Medicare Other | Source: Ambulatory Visit | Attending: Cardiology | Admitting: Cardiology

## 2011-05-03 DIAGNOSIS — I2789 Other specified pulmonary heart diseases: Secondary | ICD-10-CM | POA: Insufficient documentation

## 2011-05-03 DIAGNOSIS — E785 Hyperlipidemia, unspecified: Secondary | ICD-10-CM | POA: Insufficient documentation

## 2011-05-03 DIAGNOSIS — I498 Other specified cardiac arrhythmias: Secondary | ICD-10-CM | POA: Insufficient documentation

## 2011-05-03 DIAGNOSIS — G4733 Obstructive sleep apnea (adult) (pediatric): Secondary | ICD-10-CM | POA: Insufficient documentation

## 2011-05-03 DIAGNOSIS — I129 Hypertensive chronic kidney disease with stage 1 through stage 4 chronic kidney disease, or unspecified chronic kidney disease: Secondary | ICD-10-CM | POA: Insufficient documentation

## 2011-05-03 DIAGNOSIS — Z5189 Encounter for other specified aftercare: Secondary | ICD-10-CM | POA: Insufficient documentation

## 2011-05-03 DIAGNOSIS — I252 Old myocardial infarction: Secondary | ICD-10-CM | POA: Insufficient documentation

## 2011-05-03 DIAGNOSIS — Z9861 Coronary angioplasty status: Secondary | ICD-10-CM | POA: Insufficient documentation

## 2011-05-03 DIAGNOSIS — N189 Chronic kidney disease, unspecified: Secondary | ICD-10-CM | POA: Insufficient documentation

## 2011-05-03 DIAGNOSIS — Z951 Presence of aortocoronary bypass graft: Secondary | ICD-10-CM | POA: Insufficient documentation

## 2011-05-03 NOTE — Progress Notes (Signed)
Cardiac Rehab Medication Review by a Pharmacist  Does the patient  feel that his/her medications are working for him/her?  yes  Has the patient been experiencing any side effects to the medications prescribed?  Yes - having lightheadness with metoprolol, decreased to 25 mg twice daily from 50 mg twice daily yesterday and has started this new regimen today  Does the patient measure his/her own blood pressure or blood glucose at home?  yes - checks 6-7 times per day (having low blood pressures), no DM  Does the patient have any problems obtaining medications due to transportation or finances?   no  Understanding of regimen: excellent Understanding of indications: good Potential of compliance: excellent - uses a pill box (AM/PM)    Pharmacist comments:    Sean Willis 05/03/2011 8:15 AM

## 2011-05-07 ENCOUNTER — Encounter (HOSPITAL_COMMUNITY)
Admission: RE | Admit: 2011-05-07 | Discharge: 2011-05-07 | Disposition: A | Discharge status: Home / Self Care | Payer: Medicare Other | Source: Ambulatory Visit | Attending: Cardiology | Admitting: Cardiology

## 2011-05-07 DIAGNOSIS — I2789 Other specified pulmonary heart diseases: Secondary | ICD-10-CM | POA: Diagnosis not present

## 2011-05-07 DIAGNOSIS — G4733 Obstructive sleep apnea (adult) (pediatric): Secondary | ICD-10-CM | POA: Diagnosis not present

## 2011-05-07 DIAGNOSIS — I252 Old myocardial infarction: Secondary | ICD-10-CM | POA: Diagnosis not present

## 2011-05-07 DIAGNOSIS — Z951 Presence of aortocoronary bypass graft: Secondary | ICD-10-CM | POA: Diagnosis not present

## 2011-05-07 DIAGNOSIS — I129 Hypertensive chronic kidney disease with stage 1 through stage 4 chronic kidney disease, or unspecified chronic kidney disease: Secondary | ICD-10-CM | POA: Diagnosis not present

## 2011-05-07 DIAGNOSIS — N189 Chronic kidney disease, unspecified: Secondary | ICD-10-CM | POA: Diagnosis not present

## 2011-05-07 DIAGNOSIS — Z5189 Encounter for other specified aftercare: Secondary | ICD-10-CM | POA: Diagnosis not present

## 2011-05-07 DIAGNOSIS — E785 Hyperlipidemia, unspecified: Secondary | ICD-10-CM | POA: Diagnosis not present

## 2011-05-07 DIAGNOSIS — Z9861 Coronary angioplasty status: Secondary | ICD-10-CM | POA: Diagnosis not present

## 2011-05-07 DIAGNOSIS — I498 Other specified cardiac arrhythmias: Secondary | ICD-10-CM | POA: Diagnosis not present

## 2011-05-08 ENCOUNTER — Encounter (HOSPITAL_COMMUNITY): Payer: Self-pay

## 2011-05-08 NOTE — Progress Notes (Signed)
Pt started cardiac rehab today.  Pt tolerated light exercise without difficulty.  Pt asymptomatic with exercise.  VSS, telemetry-NSR.  Pt oriented to exercise equipment and routine.  Understanding verbalized.  Will continue to monitor the patient throughout  the program.

## 2011-05-09 ENCOUNTER — Encounter (HOSPITAL_COMMUNITY)
Admission: RE | Admit: 2011-05-09 | Discharge: 2011-05-09 | Disposition: A | Discharge status: Home / Self Care | Payer: Medicare Other | Source: Ambulatory Visit | Attending: Cardiology | Admitting: Cardiology

## 2011-05-09 DIAGNOSIS — I129 Hypertensive chronic kidney disease with stage 1 through stage 4 chronic kidney disease, or unspecified chronic kidney disease: Secondary | ICD-10-CM | POA: Diagnosis not present

## 2011-05-09 DIAGNOSIS — E785 Hyperlipidemia, unspecified: Secondary | ICD-10-CM | POA: Diagnosis not present

## 2011-05-09 DIAGNOSIS — I252 Old myocardial infarction: Secondary | ICD-10-CM | POA: Diagnosis not present

## 2011-05-09 DIAGNOSIS — Z951 Presence of aortocoronary bypass graft: Secondary | ICD-10-CM | POA: Diagnosis not present

## 2011-05-09 DIAGNOSIS — Z9861 Coronary angioplasty status: Secondary | ICD-10-CM | POA: Diagnosis not present

## 2011-05-09 DIAGNOSIS — Z5189 Encounter for other specified aftercare: Secondary | ICD-10-CM | POA: Diagnosis not present

## 2011-05-10 DIAGNOSIS — I959 Hypotension, unspecified: Secondary | ICD-10-CM | POA: Diagnosis not present

## 2011-05-10 DIAGNOSIS — I2510 Atherosclerotic heart disease of native coronary artery without angina pectoris: Secondary | ICD-10-CM | POA: Diagnosis not present

## 2011-05-10 NOTE — Progress Notes (Signed)
Sean Willis 66 y.o. male       Nutrition Screen                                                                    YES  NO Do you live in a nursing home?  X   Do you eat out more than 3 times/week?   X  If yes, how many times per week do you eat out? 6 or 7 times a week  Do you have food allergies?   X If yes, what are you allergic to?  Have you gained or lost more than 10 lbs without trying?              X  If yes, how much weight have you lost and over what time period?  20 lbs lost over 4 weeks  Do you want to lose weight?    X  If yes, what is a goal weight or amount of weight you would like to lose? 170-175 lb  Do you eat alone most of the time?   X   Do you eat less than 2 meals/day?  X If yes, how many meals do you eat?     Do you drink more than 3 alcohol drinks/day?  X If yes, how many drinks per day?   Are you having trouble with constipation? *  X If yes, what are you doing to help relieve constipation?   Do you have financial difficulties with buying food?*    X   Are you experiencing regular nausea/ vomiting?*     X   Do you have a poor appetite? *                                        X   Do you have trouble chewing/swallowing? *   X    Pt with diagnoses of:  X CABG              X Stent/ PTCA X Dyslipidemia  / HDL< 40 / LDL>70 / High TG      X %  Body fat >goal / Body Mass Index >25 X HTN / BP >120/80 X MI X Gout           Pt Risk Score   3       Diagnosis Risk Score  30       Total Risk Score   33                         High Risk               X Low Risk    HT: 69.5" Ht Readings from Last 1 Encounters:  05/03/11 5' 9.5" (1.765 m)    WT:   185.2 lb (84.2 kg) Wt Readings from Last 3 Encounters:  05/03/11 185 lb 10 oz (84.2 kg)     IBW 74.1 114%IBW BMI 27  27.1%body fat  Meds reviewed: 4 grams of fish oil daily  No past medical history on file. PMH: CKD, SVT, Obstructive Sleep apnea, mild pulmonary HTN  Activity level: Pt is active  Wt goal: 170-175  lb ( 77.3-79.5 kg) Current tobacco use? No Food/Drug Interaction? No Labs:  Lipid Panel  No results found for this basename: chol, trig, hdl, cholhdl, vldl, ldlcalc  11/01/10 LIPIDS per MD note Total Cholesterol 242 TG 399 HDL 26 LDL 100 No results found for this basename: HGBA1C  11/01/10 Glucose 112  LDL goal: < 70      MI and > 2:      family h/o, > 66 yo male Estimated Daily Nutrition Needs for: ? wt loss  1600-2100 Kcal , Total Fat 40-55gm, Saturated Fat 12-16 gm, Trans Fat 1.6-2.1 gm,  Sodium less than 1500 mg

## 2011-05-11 ENCOUNTER — Encounter (HOSPITAL_COMMUNITY)
Admission: RE | Admit: 2011-05-11 | Discharge: 2011-05-11 | Disposition: A | Discharge status: Home / Self Care | Payer: Medicare Other | Source: Ambulatory Visit | Attending: Cardiology | Admitting: Cardiology

## 2011-05-11 DIAGNOSIS — Z79899 Other long term (current) drug therapy: Secondary | ICD-10-CM | POA: Diagnosis not present

## 2011-05-11 DIAGNOSIS — Z951 Presence of aortocoronary bypass graft: Secondary | ICD-10-CM | POA: Diagnosis not present

## 2011-05-11 DIAGNOSIS — Z5189 Encounter for other specified aftercare: Secondary | ICD-10-CM | POA: Diagnosis not present

## 2011-05-11 DIAGNOSIS — I252 Old myocardial infarction: Secondary | ICD-10-CM | POA: Diagnosis not present

## 2011-05-11 DIAGNOSIS — E785 Hyperlipidemia, unspecified: Secondary | ICD-10-CM | POA: Diagnosis not present

## 2011-05-11 DIAGNOSIS — I129 Hypertensive chronic kidney disease with stage 1 through stage 4 chronic kidney disease, or unspecified chronic kidney disease: Secondary | ICD-10-CM | POA: Diagnosis not present

## 2011-05-11 DIAGNOSIS — Z9861 Coronary angioplasty status: Secondary | ICD-10-CM | POA: Diagnosis not present

## 2011-05-14 ENCOUNTER — Encounter (HOSPITAL_COMMUNITY)
Admission: RE | Admit: 2011-05-14 | Discharge: 2011-05-14 | Disposition: A | Discharge status: Home / Self Care | Payer: Medicare Other | Source: Ambulatory Visit | Attending: Cardiology | Admitting: Cardiology

## 2011-05-14 DIAGNOSIS — Z9861 Coronary angioplasty status: Secondary | ICD-10-CM | POA: Diagnosis not present

## 2011-05-14 DIAGNOSIS — I129 Hypertensive chronic kidney disease with stage 1 through stage 4 chronic kidney disease, or unspecified chronic kidney disease: Secondary | ICD-10-CM | POA: Diagnosis not present

## 2011-05-14 DIAGNOSIS — Z5189 Encounter for other specified aftercare: Secondary | ICD-10-CM | POA: Diagnosis not present

## 2011-05-14 DIAGNOSIS — I252 Old myocardial infarction: Secondary | ICD-10-CM | POA: Diagnosis not present

## 2011-05-14 DIAGNOSIS — Z951 Presence of aortocoronary bypass graft: Secondary | ICD-10-CM | POA: Diagnosis not present

## 2011-05-14 DIAGNOSIS — E785 Hyperlipidemia, unspecified: Secondary | ICD-10-CM | POA: Diagnosis not present

## 2011-05-14 NOTE — Progress Notes (Signed)
Pt arrived to cardiac rehab reporting orthostatic episode that occurred Saturday am. Pt reports he arose suddenly from lying position to go to restroom.  Pt wife states pt was "out 30 seconds" .   Per Dr. Mayford Knife, pt decreased metoprolol to 12.5mg  BID.  Pt denies further episodes since medication change.  Will continue to monitor.

## 2011-05-16 ENCOUNTER — Encounter (HOSPITAL_COMMUNITY)
Admission: RE | Admit: 2011-05-16 | Discharge: 2011-05-16 | Disposition: A | Discharge status: Home / Self Care | Payer: Medicare Other | Source: Ambulatory Visit | Attending: Cardiology | Admitting: Cardiology

## 2011-05-16 DIAGNOSIS — Z9861 Coronary angioplasty status: Secondary | ICD-10-CM | POA: Diagnosis not present

## 2011-05-16 DIAGNOSIS — E785 Hyperlipidemia, unspecified: Secondary | ICD-10-CM | POA: Diagnosis not present

## 2011-05-16 DIAGNOSIS — Z951 Presence of aortocoronary bypass graft: Secondary | ICD-10-CM | POA: Diagnosis not present

## 2011-05-16 DIAGNOSIS — I252 Old myocardial infarction: Secondary | ICD-10-CM | POA: Diagnosis not present

## 2011-05-16 DIAGNOSIS — I129 Hypertensive chronic kidney disease with stage 1 through stage 4 chronic kidney disease, or unspecified chronic kidney disease: Secondary | ICD-10-CM | POA: Diagnosis not present

## 2011-05-16 DIAGNOSIS — Z5189 Encounter for other specified aftercare: Secondary | ICD-10-CM | POA: Diagnosis not present

## 2011-05-18 ENCOUNTER — Encounter (HOSPITAL_COMMUNITY)
Admission: RE | Admit: 2011-05-18 | Discharge: 2011-05-18 | Disposition: A | Discharge status: Home / Self Care | Payer: Medicare Other | Source: Ambulatory Visit | Attending: Cardiology | Admitting: Cardiology

## 2011-05-18 DIAGNOSIS — I252 Old myocardial infarction: Secondary | ICD-10-CM | POA: Diagnosis not present

## 2011-05-18 DIAGNOSIS — Z9861 Coronary angioplasty status: Secondary | ICD-10-CM | POA: Diagnosis not present

## 2011-05-18 DIAGNOSIS — Z5189 Encounter for other specified aftercare: Secondary | ICD-10-CM | POA: Diagnosis not present

## 2011-05-18 DIAGNOSIS — E785 Hyperlipidemia, unspecified: Secondary | ICD-10-CM | POA: Diagnosis not present

## 2011-05-18 DIAGNOSIS — I129 Hypertensive chronic kidney disease with stage 1 through stage 4 chronic kidney disease, or unspecified chronic kidney disease: Secondary | ICD-10-CM | POA: Diagnosis not present

## 2011-05-18 DIAGNOSIS — Z951 Presence of aortocoronary bypass graft: Secondary | ICD-10-CM | POA: Diagnosis not present

## 2011-05-18 NOTE — Progress Notes (Signed)
Pt had brief episode of atrial fibrillation with RVR -166bpm( approximately 1 minute)  BP stable.  Pt asymptomatic.  Pt exercising on bicycle during event.  Phone call to Dr. Norris Cross office to notify of episode.  Rhythm strips faxed.  Spoke to [place], who will review with Dr. Mayford Knife and further advise.

## 2011-05-21 ENCOUNTER — Encounter (HOSPITAL_COMMUNITY)
Admission: RE | Admit: 2011-05-21 | Discharge: 2011-05-21 | Disposition: A | Discharge status: Home / Self Care | Payer: Medicare Other | Source: Ambulatory Visit | Attending: Cardiology | Admitting: Cardiology

## 2011-05-21 DIAGNOSIS — Z951 Presence of aortocoronary bypass graft: Secondary | ICD-10-CM | POA: Diagnosis not present

## 2011-05-21 DIAGNOSIS — I252 Old myocardial infarction: Secondary | ICD-10-CM | POA: Diagnosis not present

## 2011-05-21 DIAGNOSIS — Z5189 Encounter for other specified aftercare: Secondary | ICD-10-CM | POA: Diagnosis not present

## 2011-05-21 DIAGNOSIS — Z9861 Coronary angioplasty status: Secondary | ICD-10-CM | POA: Diagnosis not present

## 2011-05-21 DIAGNOSIS — E785 Hyperlipidemia, unspecified: Secondary | ICD-10-CM | POA: Diagnosis not present

## 2011-05-21 DIAGNOSIS — I129 Hypertensive chronic kidney disease with stage 1 through stage 4 chronic kidney disease, or unspecified chronic kidney disease: Secondary | ICD-10-CM | POA: Diagnosis not present

## 2011-05-21 NOTE — Progress Notes (Signed)
[name] 66 y.o. male Nutrition Note Spoke with pt.  Nutrition Plan, Nutrition Survey, and cholesterol goals reviewed with pt. Pt is following Step 1 of the Therapeutic Lifestyle Changes diet. Pt reports losing 20 lbs over 4 weeks due to heart procedure. Weight loss tips discussed.  Pt reports he was eating more fish and whole grains, "but then I got gout." Heart Healthy eating with gout discussed. Pt expressed understanding of information reviewed. Nutrition Diagnosis   Food-and nutrition-related knowledge deficit related to lack of exposure to information as related to diagnosis of: ? CVD    Overweight related to excessive energy intake as evidenced by a BMI of  27.0 Nutrition RX/ Estimated Daily Nutrition Needs for: wt loss  1600-2100 Kcal, 40-55 gm fat, 12-16 gm sat fat, 1.6-2.1 gm trans-fat, <1500 mg sodium  Nutrition Intervention   Pt's individual nutrition plan including cholesterol goals reviewed with pt.   Benefits of adopting Therapeutic Lifestyle Changes discussed when Medficts reviewed.   Pt to attend the Portion Distortion class   Pt to attend the  ? Nutrition I class                     ? Nutrition II class    Pt given handouts for: ? wt loss ? Gout   Continue client-centered nutrition education by RD, as part of interdisciplinary care. Goal(s)   Pt to identify and limit food sources of saturated fat, trans fat, and cholesterol   Pt to identify food quantities necessary to achieve: ? wt loss to a goal wt of 170-175 lb (77.3-79.5 kg) at graduation from cardiac rehab.  Monitor and Evaluate progress toward nutrition goal with team.

## 2011-05-23 ENCOUNTER — Encounter (HOSPITAL_COMMUNITY)
Admission: RE | Admit: 2011-05-23 | Discharge: 2011-05-23 | Disposition: A | Discharge status: Home / Self Care | Payer: Medicare Other | Source: Ambulatory Visit | Attending: Cardiology | Admitting: Cardiology

## 2011-05-23 DIAGNOSIS — I129 Hypertensive chronic kidney disease with stage 1 through stage 4 chronic kidney disease, or unspecified chronic kidney disease: Secondary | ICD-10-CM | POA: Diagnosis not present

## 2011-05-23 DIAGNOSIS — Z9861 Coronary angioplasty status: Secondary | ICD-10-CM | POA: Diagnosis not present

## 2011-05-23 DIAGNOSIS — Z5189 Encounter for other specified aftercare: Secondary | ICD-10-CM | POA: Diagnosis not present

## 2011-05-23 DIAGNOSIS — E785 Hyperlipidemia, unspecified: Secondary | ICD-10-CM | POA: Diagnosis not present

## 2011-05-23 DIAGNOSIS — I4891 Unspecified atrial fibrillation: Secondary | ICD-10-CM | POA: Diagnosis not present

## 2011-05-23 DIAGNOSIS — Z951 Presence of aortocoronary bypass graft: Secondary | ICD-10-CM | POA: Diagnosis not present

## 2011-05-23 DIAGNOSIS — I252 Old myocardial infarction: Secondary | ICD-10-CM | POA: Diagnosis not present

## 2011-05-23 DIAGNOSIS — I2510 Atherosclerotic heart disease of native coronary artery without angina pectoris: Secondary | ICD-10-CM | POA: Diagnosis not present

## 2011-05-23 NOTE — Progress Notes (Signed)
Pt had episode of afib with RVR-rate 166 with exercise today. Resolved spontaneously.  Pt asymptomatic.  Per Cristopher Peru, NP pt is scheduled for 30 day event monitor.  Pt aware and states he is awaiting monitor to arrive via [place] mail.  rhyhtm strip faxed to Dr. Mayford Knife for review.

## 2011-05-25 ENCOUNTER — Encounter (HOSPITAL_COMMUNITY)
Admission: RE | Admit: 2011-05-25 | Discharge: 2011-05-25 | Disposition: A | Discharge status: Home / Self Care | Payer: Medicare Other | Source: Ambulatory Visit | Attending: Cardiology | Admitting: Cardiology

## 2011-05-25 DIAGNOSIS — Z5189 Encounter for other specified aftercare: Secondary | ICD-10-CM | POA: Diagnosis not present

## 2011-05-25 DIAGNOSIS — E785 Hyperlipidemia, unspecified: Secondary | ICD-10-CM | POA: Diagnosis not present

## 2011-05-25 DIAGNOSIS — Z951 Presence of aortocoronary bypass graft: Secondary | ICD-10-CM | POA: Diagnosis not present

## 2011-05-25 DIAGNOSIS — I252 Old myocardial infarction: Secondary | ICD-10-CM | POA: Diagnosis not present

## 2011-05-25 DIAGNOSIS — I129 Hypertensive chronic kidney disease with stage 1 through stage 4 chronic kidney disease, or unspecified chronic kidney disease: Secondary | ICD-10-CM | POA: Diagnosis not present

## 2011-05-25 DIAGNOSIS — Z9861 Coronary angioplasty status: Secondary | ICD-10-CM | POA: Diagnosis not present

## 2011-05-27 DIAGNOSIS — I4891 Unspecified atrial fibrillation: Secondary | ICD-10-CM | POA: Diagnosis not present

## 2011-05-28 ENCOUNTER — Encounter (HOSPITAL_COMMUNITY): Payer: Medicare Other

## 2011-05-29 ENCOUNTER — Telehealth (HOSPITAL_COMMUNITY): Payer: Self-pay | Admitting: Cardiac Rehabilitation

## 2011-05-29 DIAGNOSIS — I4891 Unspecified atrial fibrillation: Secondary | ICD-10-CM | POA: Diagnosis not present

## 2011-05-29 DIAGNOSIS — I2510 Atherosclerotic heart disease of native coronary artery without angina pectoris: Secondary | ICD-10-CM | POA: Diagnosis not present

## 2011-05-30 ENCOUNTER — Encounter (HOSPITAL_COMMUNITY): Payer: Medicare Other

## 2011-06-01 ENCOUNTER — Encounter (HOSPITAL_COMMUNITY)
Admission: RE | Admit: 2011-06-01 | Discharge: 2011-06-01 | Disposition: A | Discharge status: Home / Self Care | Payer: Medicare Other | Source: Ambulatory Visit | Attending: Cardiology | Admitting: Cardiology

## 2011-06-01 DIAGNOSIS — E785 Hyperlipidemia, unspecified: Secondary | ICD-10-CM | POA: Diagnosis not present

## 2011-06-01 DIAGNOSIS — Z5189 Encounter for other specified aftercare: Secondary | ICD-10-CM | POA: Diagnosis not present

## 2011-06-01 DIAGNOSIS — I129 Hypertensive chronic kidney disease with stage 1 through stage 4 chronic kidney disease, or unspecified chronic kidney disease: Secondary | ICD-10-CM | POA: Diagnosis not present

## 2011-06-01 DIAGNOSIS — Z951 Presence of aortocoronary bypass graft: Secondary | ICD-10-CM | POA: Diagnosis not present

## 2011-06-01 DIAGNOSIS — Z9861 Coronary angioplasty status: Secondary | ICD-10-CM | POA: Diagnosis not present

## 2011-06-01 DIAGNOSIS — I252 Old myocardial infarction: Secondary | ICD-10-CM | POA: Diagnosis not present

## 2011-06-04 ENCOUNTER — Encounter (HOSPITAL_COMMUNITY)
Admission: RE | Admit: 2011-06-04 | Discharge: 2011-06-04 | Disposition: A | Discharge status: Home / Self Care | Payer: Medicare Other | Source: Ambulatory Visit | Attending: Cardiology | Admitting: Cardiology

## 2011-06-04 DIAGNOSIS — E785 Hyperlipidemia, unspecified: Secondary | ICD-10-CM | POA: Diagnosis not present

## 2011-06-04 DIAGNOSIS — I252 Old myocardial infarction: Secondary | ICD-10-CM | POA: Insufficient documentation

## 2011-06-04 DIAGNOSIS — I498 Other specified cardiac arrhythmias: Secondary | ICD-10-CM | POA: Insufficient documentation

## 2011-06-04 DIAGNOSIS — N189 Chronic kidney disease, unspecified: Secondary | ICD-10-CM | POA: Insufficient documentation

## 2011-06-04 DIAGNOSIS — Z951 Presence of aortocoronary bypass graft: Secondary | ICD-10-CM | POA: Diagnosis not present

## 2011-06-04 DIAGNOSIS — Z9861 Coronary angioplasty status: Secondary | ICD-10-CM | POA: Insufficient documentation

## 2011-06-04 DIAGNOSIS — I2789 Other specified pulmonary heart diseases: Secondary | ICD-10-CM | POA: Diagnosis not present

## 2011-06-04 DIAGNOSIS — I129 Hypertensive chronic kidney disease with stage 1 through stage 4 chronic kidney disease, or unspecified chronic kidney disease: Secondary | ICD-10-CM | POA: Diagnosis not present

## 2011-06-04 DIAGNOSIS — G4733 Obstructive sleep apnea (adult) (pediatric): Secondary | ICD-10-CM | POA: Diagnosis not present

## 2011-06-04 DIAGNOSIS — Z5189 Encounter for other specified aftercare: Secondary | ICD-10-CM | POA: Insufficient documentation

## 2011-06-05 DIAGNOSIS — Z79899 Other long term (current) drug therapy: Secondary | ICD-10-CM | POA: Diagnosis not present

## 2011-06-05 DIAGNOSIS — I10 Essential (primary) hypertension: Secondary | ICD-10-CM | POA: Diagnosis not present

## 2011-06-05 DIAGNOSIS — I2510 Atherosclerotic heart disease of native coronary artery without angina pectoris: Secondary | ICD-10-CM | POA: Diagnosis not present

## 2011-06-05 DIAGNOSIS — E782 Mixed hyperlipidemia: Secondary | ICD-10-CM | POA: Diagnosis not present

## 2011-06-06 ENCOUNTER — Encounter (HOSPITAL_COMMUNITY)
Admission: RE | Admit: 2011-06-06 | Discharge: 2011-06-06 | Disposition: A | Discharge status: Home / Self Care | Payer: Medicare Other | Source: Ambulatory Visit | Attending: Cardiology | Admitting: Cardiology

## 2011-06-08 ENCOUNTER — Encounter (HOSPITAL_COMMUNITY)
Admission: RE | Admit: 2011-06-08 | Discharge: 2011-06-08 | Disposition: A | Discharge status: Home / Self Care | Payer: Medicare Other | Source: Ambulatory Visit | Attending: Cardiology | Admitting: Cardiology

## 2011-06-08 DIAGNOSIS — I4891 Unspecified atrial fibrillation: Secondary | ICD-10-CM | POA: Diagnosis not present

## 2011-06-08 DIAGNOSIS — I2510 Atherosclerotic heart disease of native coronary artery without angina pectoris: Secondary | ICD-10-CM | POA: Diagnosis not present

## 2011-06-11 ENCOUNTER — Encounter (HOSPITAL_COMMUNITY)
Admission: RE | Admit: 2011-06-11 | Discharge: 2011-06-11 | Disposition: A | Discharge status: Home / Self Care | Payer: Medicare Other | Source: Ambulatory Visit | Attending: Cardiology | Admitting: Cardiology

## 2011-06-13 ENCOUNTER — Encounter (HOSPITAL_COMMUNITY)
Admission: RE | Admit: 2011-06-13 | Discharge: 2011-06-13 | Disposition: A | Discharge status: Home / Self Care | Payer: Medicare Other | Source: Ambulatory Visit | Attending: Cardiology | Admitting: Cardiology

## 2011-06-15 ENCOUNTER — Encounter (HOSPITAL_COMMUNITY)
Admission: RE | Admit: 2011-06-15 | Discharge: 2011-06-15 | Disposition: A | Discharge status: Home / Self Care | Payer: Medicare Other | Source: Ambulatory Visit | Attending: Cardiology | Admitting: Cardiology

## 2011-06-18 ENCOUNTER — Encounter (HOSPITAL_COMMUNITY)
Admission: RE | Admit: 2011-06-18 | Discharge: 2011-06-18 | Disposition: A | Discharge status: Home / Self Care | Payer: Medicare Other | Source: Ambulatory Visit | Attending: Cardiology | Admitting: Cardiology

## 2011-06-20 ENCOUNTER — Encounter (HOSPITAL_COMMUNITY)
Admission: RE | Admit: 2011-06-20 | Discharge: 2011-06-20 | Disposition: A | Discharge status: Home / Self Care | Payer: Medicare Other | Source: Ambulatory Visit | Attending: Cardiology | Admitting: Cardiology

## 2011-06-22 ENCOUNTER — Encounter (HOSPITAL_COMMUNITY): Payer: Medicare Other

## 2011-06-25 ENCOUNTER — Encounter (HOSPITAL_COMMUNITY)
Admission: RE | Admit: 2011-06-25 | Discharge: 2011-06-25 | Disposition: A | Discharge status: Home / Self Care | Payer: Medicare Other | Source: Ambulatory Visit | Attending: Cardiology | Admitting: Cardiology

## 2011-06-27 ENCOUNTER — Encounter (HOSPITAL_COMMUNITY)
Admission: RE | Admit: 2011-06-27 | Discharge: 2011-06-27 | Disposition: A | Discharge status: Home / Self Care | Payer: Medicare Other | Source: Ambulatory Visit | Attending: Cardiology | Admitting: Cardiology

## 2011-06-29 ENCOUNTER — Encounter (HOSPITAL_COMMUNITY)
Admission: RE | Admit: 2011-06-29 | Discharge: 2011-06-29 | Disposition: A | Discharge status: Home / Self Care | Payer: Medicare Other | Source: Ambulatory Visit | Attending: Cardiology | Admitting: Cardiology

## 2011-07-02 ENCOUNTER — Encounter (HOSPITAL_COMMUNITY)
Admission: RE | Admit: 2011-07-02 | Discharge: 2011-07-02 | Disposition: A | Discharge status: Home / Self Care | Payer: Medicare Other | Source: Ambulatory Visit | Attending: Cardiology | Admitting: Cardiology

## 2011-07-02 DIAGNOSIS — E785 Hyperlipidemia, unspecified: Secondary | ICD-10-CM | POA: Diagnosis not present

## 2011-07-02 DIAGNOSIS — I252 Old myocardial infarction: Secondary | ICD-10-CM | POA: Diagnosis not present

## 2011-07-02 DIAGNOSIS — G4733 Obstructive sleep apnea (adult) (pediatric): Secondary | ICD-10-CM | POA: Diagnosis not present

## 2011-07-02 DIAGNOSIS — Z5189 Encounter for other specified aftercare: Secondary | ICD-10-CM | POA: Diagnosis not present

## 2011-07-02 DIAGNOSIS — Z9861 Coronary angioplasty status: Secondary | ICD-10-CM | POA: Insufficient documentation

## 2011-07-02 DIAGNOSIS — I129 Hypertensive chronic kidney disease with stage 1 through stage 4 chronic kidney disease, or unspecified chronic kidney disease: Secondary | ICD-10-CM | POA: Insufficient documentation

## 2011-07-02 DIAGNOSIS — I498 Other specified cardiac arrhythmias: Secondary | ICD-10-CM | POA: Diagnosis not present

## 2011-07-02 DIAGNOSIS — Z951 Presence of aortocoronary bypass graft: Secondary | ICD-10-CM | POA: Diagnosis not present

## 2011-07-02 DIAGNOSIS — I2789 Other specified pulmonary heart diseases: Secondary | ICD-10-CM | POA: Diagnosis not present

## 2011-07-02 DIAGNOSIS — N189 Chronic kidney disease, unspecified: Secondary | ICD-10-CM | POA: Diagnosis not present

## 2011-07-04 ENCOUNTER — Encounter (HOSPITAL_COMMUNITY)
Admission: RE | Admit: 2011-07-04 | Discharge: 2011-07-04 | Disposition: A | Discharge status: Home / Self Care | Payer: Medicare Other | Source: Ambulatory Visit | Attending: Cardiology | Admitting: Cardiology

## 2011-07-06 ENCOUNTER — Encounter (HOSPITAL_COMMUNITY)
Admission: RE | Admit: 2011-07-06 | Discharge: 2011-07-06 | Disposition: A | Discharge status: Home / Self Care | Payer: Medicare Other | Source: Ambulatory Visit | Attending: Cardiology | Admitting: Cardiology

## 2011-07-06 NOTE — Progress Notes (Signed)
Reviewed home exercise with pt today.  Pt plans to use exercise bike and treadmill at home for exercise.  Reviewed THR, pulse, RPE, sign and symptoms, NTG use, and when to call 911 or MD.  Pt voiced understanding. Electronically signed by Harriett Sine MS on Friday July 06 2011 at 1443

## 2011-07-09 ENCOUNTER — Encounter (HOSPITAL_COMMUNITY)
Admission: RE | Admit: 2011-07-09 | Discharge: 2011-07-09 | Disposition: A | Discharge status: Home / Self Care | Payer: Medicare Other | Source: Ambulatory Visit | Attending: Cardiology | Admitting: Cardiology

## 2011-07-11 ENCOUNTER — Encounter (HOSPITAL_COMMUNITY): Payer: Medicare Other

## 2011-07-13 ENCOUNTER — Encounter (HOSPITAL_COMMUNITY)
Admission: RE | Admit: 2011-07-13 | Discharge: 2011-07-13 | Disposition: A | Discharge status: Home / Self Care | Payer: Medicare Other | Source: Ambulatory Visit | Attending: Cardiology | Admitting: Cardiology

## 2011-07-13 NOTE — Progress Notes (Signed)
Pt had brief nonsustained episodes of atrial fibrillation with RVR, fastest rate 190.  Both episodes approximately 15-20 seconds while patient exercising. Pt asymptomatic.  BP-130/70.  Dr. Mayford Knife made aware, strips faxed for her review.  New order received for pt to start amiodarone 200mg  BID and f/u in office 07/16/2011 with Cristopher Peru, ANP. Pt may return to exercise 07/16/2011 as scheduled.  Pt was given instruction per Marjean Donna, Dr. Norris Cross nurse.  Pt verbalized understanding

## 2011-07-16 ENCOUNTER — Encounter (HOSPITAL_COMMUNITY)
Admission: RE | Admit: 2011-07-16 | Discharge: 2011-07-16 | Disposition: A | Discharge status: Home / Self Care | Payer: Medicare Other | Source: Ambulatory Visit | Attending: Cardiology | Admitting: Cardiology

## 2011-07-16 DIAGNOSIS — I4891 Unspecified atrial fibrillation: Secondary | ICD-10-CM | POA: Diagnosis not present

## 2011-07-16 DIAGNOSIS — E782 Mixed hyperlipidemia: Secondary | ICD-10-CM | POA: Diagnosis not present

## 2011-07-16 DIAGNOSIS — I2510 Atherosclerotic heart disease of native coronary artery without angina pectoris: Secondary | ICD-10-CM | POA: Diagnosis not present

## 2011-07-18 ENCOUNTER — Encounter (HOSPITAL_COMMUNITY)
Admission: RE | Admit: 2011-07-18 | Discharge: 2011-07-18 | Disposition: A | Discharge status: Home / Self Care | Payer: Medicare Other | Source: Ambulatory Visit | Attending: Cardiology | Admitting: Cardiology

## 2011-07-18 NOTE — Progress Notes (Signed)
Sean Willis saw Savan Ruta ANP on Monday. Sean Jarosz's medications were not changed at this time. Dr Mayford Knife was going to start Sean Willis on Amiodarone. Lenard Galloway realized that he forgot to take his medications on Friday when he had atrial fibrillation.  No further arrythmia's have been noted during exercise. Will continue to monitor the patient throughout  the program.

## 2011-07-20 ENCOUNTER — Encounter (HOSPITAL_COMMUNITY)
Admission: RE | Admit: 2011-07-20 | Discharge: 2011-07-20 | Disposition: A | Discharge status: Home / Self Care | Payer: Medicare Other | Source: Ambulatory Visit | Attending: Cardiology | Admitting: Cardiology

## 2011-07-23 ENCOUNTER — Encounter (HOSPITAL_COMMUNITY)
Admission: RE | Admit: 2011-07-23 | Discharge: 2011-07-23 | Disposition: A | Discharge status: Home / Self Care | Payer: Medicare Other | Source: Ambulatory Visit | Attending: Cardiology | Admitting: Cardiology

## 2011-07-25 ENCOUNTER — Encounter (HOSPITAL_COMMUNITY)
Admission: RE | Admit: 2011-07-25 | Discharge: 2011-07-25 | Disposition: A | Discharge status: Home / Self Care | Payer: Medicare Other | Source: Ambulatory Visit | Attending: Cardiology | Admitting: Cardiology

## 2011-07-25 NOTE — Progress Notes (Signed)
Pt educated about atrial fibrillation and importance of medication compliance, early treatment and avoidance of dehydration, caffeine, extreme exertion and other triggers.  Pt questioned if appropriate to take additional metoprolol if he is over exerting himself to prevent afib attack. Pt cautioned against taking extra dose without discussing with Dr. Mayford Knife.  Will continue to monitor.

## 2011-07-27 ENCOUNTER — Encounter (HOSPITAL_COMMUNITY)
Admission: RE | Admit: 2011-07-27 | Discharge: 2011-07-27 | Disposition: A | Discharge status: Home / Self Care | Payer: Medicare Other | Source: Ambulatory Visit | Attending: Cardiology | Admitting: Cardiology

## 2011-07-27 DIAGNOSIS — N401 Enlarged prostate with lower urinary tract symptoms: Secondary | ICD-10-CM | POA: Diagnosis not present

## 2011-07-27 DIAGNOSIS — N200 Calculus of kidney: Secondary | ICD-10-CM | POA: Diagnosis not present

## 2011-07-27 DIAGNOSIS — N281 Cyst of kidney, acquired: Secondary | ICD-10-CM | POA: Diagnosis not present

## 2011-07-30 ENCOUNTER — Encounter (HOSPITAL_COMMUNITY): Payer: Medicare Other

## 2011-08-01 ENCOUNTER — Encounter (HOSPITAL_COMMUNITY)
Admission: RE | Admit: 2011-08-01 | Discharge: 2011-08-01 | Disposition: A | Discharge status: Home / Self Care | Payer: Medicare Other | Source: Ambulatory Visit | Attending: Cardiology | Admitting: Cardiology

## 2011-08-03 ENCOUNTER — Encounter (HOSPITAL_COMMUNITY)
Admission: RE | Admit: 2011-08-03 | Discharge: 2011-08-03 | Disposition: A | Discharge status: Home / Self Care | Payer: Medicare Other | Source: Ambulatory Visit | Attending: Cardiology | Admitting: Cardiology

## 2011-08-03 DIAGNOSIS — G4733 Obstructive sleep apnea (adult) (pediatric): Secondary | ICD-10-CM | POA: Diagnosis not present

## 2011-08-03 DIAGNOSIS — Z9861 Coronary angioplasty status: Secondary | ICD-10-CM | POA: Insufficient documentation

## 2011-08-03 DIAGNOSIS — I129 Hypertensive chronic kidney disease with stage 1 through stage 4 chronic kidney disease, or unspecified chronic kidney disease: Secondary | ICD-10-CM | POA: Diagnosis not present

## 2011-08-03 DIAGNOSIS — I498 Other specified cardiac arrhythmias: Secondary | ICD-10-CM | POA: Diagnosis not present

## 2011-08-03 DIAGNOSIS — Z951 Presence of aortocoronary bypass graft: Secondary | ICD-10-CM | POA: Insufficient documentation

## 2011-08-03 DIAGNOSIS — Z5189 Encounter for other specified aftercare: Secondary | ICD-10-CM | POA: Diagnosis not present

## 2011-08-03 DIAGNOSIS — E785 Hyperlipidemia, unspecified: Secondary | ICD-10-CM | POA: Insufficient documentation

## 2011-08-03 DIAGNOSIS — I252 Old myocardial infarction: Secondary | ICD-10-CM | POA: Insufficient documentation

## 2011-08-03 DIAGNOSIS — N189 Chronic kidney disease, unspecified: Secondary | ICD-10-CM | POA: Insufficient documentation

## 2011-08-03 DIAGNOSIS — I2789 Other specified pulmonary heart diseases: Secondary | ICD-10-CM | POA: Insufficient documentation

## 2011-08-06 ENCOUNTER — Encounter (HOSPITAL_COMMUNITY)
Admission: RE | Admit: 2011-08-06 | Discharge: 2011-08-06 | Disposition: A | Discharge status: Home / Self Care | Payer: Medicare Other | Source: Ambulatory Visit | Attending: Cardiology | Admitting: Cardiology

## 2011-08-06 DIAGNOSIS — I252 Old myocardial infarction: Secondary | ICD-10-CM | POA: Diagnosis not present

## 2011-08-06 DIAGNOSIS — N281 Cyst of kidney, acquired: Secondary | ICD-10-CM | POA: Diagnosis not present

## 2011-08-06 DIAGNOSIS — N200 Calculus of kidney: Secondary | ICD-10-CM | POA: Diagnosis not present

## 2011-08-06 DIAGNOSIS — E785 Hyperlipidemia, unspecified: Secondary | ICD-10-CM | POA: Diagnosis not present

## 2011-08-06 DIAGNOSIS — Z5189 Encounter for other specified aftercare: Secondary | ICD-10-CM | POA: Diagnosis not present

## 2011-08-06 DIAGNOSIS — Z9861 Coronary angioplasty status: Secondary | ICD-10-CM | POA: Diagnosis not present

## 2011-08-06 DIAGNOSIS — Z951 Presence of aortocoronary bypass graft: Secondary | ICD-10-CM | POA: Diagnosis not present

## 2011-08-06 DIAGNOSIS — I129 Hypertensive chronic kidney disease with stage 1 through stage 4 chronic kidney disease, or unspecified chronic kidney disease: Secondary | ICD-10-CM | POA: Diagnosis not present

## 2011-08-08 ENCOUNTER — Encounter (HOSPITAL_COMMUNITY)
Admission: RE | Admit: 2011-08-08 | Discharge: 2011-08-08 | Disposition: A | Discharge status: Home / Self Care | Payer: Medicare Other | Source: Ambulatory Visit | Attending: Cardiology | Admitting: Cardiology

## 2011-08-08 DIAGNOSIS — E785 Hyperlipidemia, unspecified: Secondary | ICD-10-CM | POA: Diagnosis not present

## 2011-08-08 DIAGNOSIS — Z5189 Encounter for other specified aftercare: Secondary | ICD-10-CM | POA: Diagnosis not present

## 2011-08-08 DIAGNOSIS — I129 Hypertensive chronic kidney disease with stage 1 through stage 4 chronic kidney disease, or unspecified chronic kidney disease: Secondary | ICD-10-CM | POA: Diagnosis not present

## 2011-08-08 DIAGNOSIS — Z951 Presence of aortocoronary bypass graft: Secondary | ICD-10-CM | POA: Diagnosis not present

## 2011-08-08 DIAGNOSIS — I252 Old myocardial infarction: Secondary | ICD-10-CM | POA: Diagnosis not present

## 2011-08-08 DIAGNOSIS — Z9861 Coronary angioplasty status: Secondary | ICD-10-CM | POA: Diagnosis not present

## 2011-08-08 DIAGNOSIS — I10 Essential (primary) hypertension: Secondary | ICD-10-CM | POA: Diagnosis not present

## 2011-08-08 DIAGNOSIS — I2510 Atherosclerotic heart disease of native coronary artery without angina pectoris: Secondary | ICD-10-CM | POA: Diagnosis not present

## 2011-08-08 NOTE — Progress Notes (Signed)
At cardiac rehab today, pt reported episode of chest tightness 2 days ago that occurred after pt received contrast for CT scan.  Pt states sx were dull, constant no change with activity or rest.  Pt states sx were self relieved after lasting approximately 12 hours.  Pt states he does not have SL NTG. Pt states sx were similar to GERD however more right sided.  SX did not occur with exercise today.  PC to Dr. Norris Cross office.  Pt advised to present to ED for worsening, unrelieved sx.  Understanding verbalized

## 2011-08-10 ENCOUNTER — Encounter (HOSPITAL_COMMUNITY): Payer: Medicare Other

## 2011-09-06 DIAGNOSIS — E782 Mixed hyperlipidemia: Secondary | ICD-10-CM | POA: Diagnosis not present

## 2011-09-06 DIAGNOSIS — I4891 Unspecified atrial fibrillation: Secondary | ICD-10-CM | POA: Diagnosis not present

## 2011-09-06 DIAGNOSIS — I2510 Atherosclerotic heart disease of native coronary artery without angina pectoris: Secondary | ICD-10-CM | POA: Diagnosis not present

## 2011-09-06 DIAGNOSIS — Z79899 Other long term (current) drug therapy: Secondary | ICD-10-CM | POA: Diagnosis not present

## 2011-09-06 DIAGNOSIS — I10 Essential (primary) hypertension: Secondary | ICD-10-CM | POA: Diagnosis not present

## 2011-09-07 DIAGNOSIS — Z79899 Other long term (current) drug therapy: Secondary | ICD-10-CM | POA: Diagnosis not present

## 2012-01-11 DIAGNOSIS — I2510 Atherosclerotic heart disease of native coronary artery without angina pectoris: Secondary | ICD-10-CM | POA: Diagnosis not present

## 2012-01-11 DIAGNOSIS — Z79899 Other long term (current) drug therapy: Secondary | ICD-10-CM | POA: Diagnosis not present

## 2012-01-17 DIAGNOSIS — H2510 Age-related nuclear cataract, unspecified eye: Secondary | ICD-10-CM | POA: Diagnosis not present

## 2012-03-26 DIAGNOSIS — I2510 Atherosclerotic heart disease of native coronary artery without angina pectoris: Secondary | ICD-10-CM | POA: Diagnosis not present

## 2012-03-26 DIAGNOSIS — I10 Essential (primary) hypertension: Secondary | ICD-10-CM | POA: Diagnosis not present

## 2012-03-26 DIAGNOSIS — E782 Mixed hyperlipidemia: Secondary | ICD-10-CM | POA: Diagnosis not present

## 2012-03-28 DIAGNOSIS — E782 Mixed hyperlipidemia: Secondary | ICD-10-CM | POA: Diagnosis not present

## 2012-03-28 DIAGNOSIS — I2510 Atherosclerotic heart disease of native coronary artery without angina pectoris: Secondary | ICD-10-CM | POA: Diagnosis not present

## 2012-03-28 DIAGNOSIS — I10 Essential (primary) hypertension: Secondary | ICD-10-CM | POA: Diagnosis not present

## 2012-04-16 DIAGNOSIS — Z1331 Encounter for screening for depression: Secondary | ICD-10-CM | POA: Diagnosis not present

## 2012-04-16 DIAGNOSIS — Z0000 Encounter for general adult medical examination without abnormal findings: Secondary | ICD-10-CM | POA: Diagnosis not present

## 2012-04-16 DIAGNOSIS — I10 Essential (primary) hypertension: Secondary | ICD-10-CM | POA: Diagnosis not present

## 2012-04-16 DIAGNOSIS — N183 Chronic kidney disease, stage 3 unspecified: Secondary | ICD-10-CM | POA: Diagnosis not present

## 2012-04-16 DIAGNOSIS — M109 Gout, unspecified: Secondary | ICD-10-CM | POA: Diagnosis not present

## 2012-04-16 DIAGNOSIS — K219 Gastro-esophageal reflux disease without esophagitis: Secondary | ICD-10-CM | POA: Diagnosis not present

## 2012-04-16 DIAGNOSIS — E782 Mixed hyperlipidemia: Secondary | ICD-10-CM | POA: Diagnosis not present

## 2012-04-16 DIAGNOSIS — I252 Old myocardial infarction: Secondary | ICD-10-CM | POA: Diagnosis not present

## 2012-07-11 DIAGNOSIS — Z79899 Other long term (current) drug therapy: Secondary | ICD-10-CM | POA: Diagnosis not present

## 2012-07-11 DIAGNOSIS — I2510 Atherosclerotic heart disease of native coronary artery without angina pectoris: Secondary | ICD-10-CM | POA: Diagnosis not present

## 2012-07-22 DIAGNOSIS — H2510 Age-related nuclear cataract, unspecified eye: Secondary | ICD-10-CM | POA: Diagnosis not present

## 2012-07-31 DIAGNOSIS — N281 Cyst of kidney, acquired: Secondary | ICD-10-CM | POA: Diagnosis not present

## 2012-07-31 DIAGNOSIS — N401 Enlarged prostate with lower urinary tract symptoms: Secondary | ICD-10-CM | POA: Diagnosis not present

## 2012-07-31 DIAGNOSIS — N529 Male erectile dysfunction, unspecified: Secondary | ICD-10-CM | POA: Diagnosis not present

## 2012-07-31 DIAGNOSIS — N200 Calculus of kidney: Secondary | ICD-10-CM | POA: Diagnosis not present

## 2012-09-24 DIAGNOSIS — I2510 Atherosclerotic heart disease of native coronary artery without angina pectoris: Secondary | ICD-10-CM | POA: Diagnosis not present

## 2012-09-24 DIAGNOSIS — E782 Mixed hyperlipidemia: Secondary | ICD-10-CM | POA: Diagnosis not present

## 2012-09-24 DIAGNOSIS — I10 Essential (primary) hypertension: Secondary | ICD-10-CM | POA: Diagnosis not present

## 2012-10-15 DIAGNOSIS — M109 Gout, unspecified: Secondary | ICD-10-CM | POA: Diagnosis not present

## 2012-10-15 DIAGNOSIS — I252 Old myocardial infarction: Secondary | ICD-10-CM | POA: Diagnosis not present

## 2012-10-15 DIAGNOSIS — I10 Essential (primary) hypertension: Secondary | ICD-10-CM | POA: Diagnosis not present

## 2012-10-15 DIAGNOSIS — K219 Gastro-esophageal reflux disease without esophagitis: Secondary | ICD-10-CM | POA: Diagnosis not present

## 2012-10-15 DIAGNOSIS — N183 Chronic kidney disease, stage 3 unspecified: Secondary | ICD-10-CM | POA: Diagnosis not present

## 2012-10-15 DIAGNOSIS — I2510 Atherosclerotic heart disease of native coronary artery without angina pectoris: Secondary | ICD-10-CM | POA: Diagnosis not present

## 2012-10-15 DIAGNOSIS — E782 Mixed hyperlipidemia: Secondary | ICD-10-CM | POA: Diagnosis not present

## 2012-10-15 DIAGNOSIS — Z23 Encounter for immunization: Secondary | ICD-10-CM | POA: Diagnosis not present

## 2013-01-01 ENCOUNTER — Other Ambulatory Visit: Payer: Self-pay | Admitting: *Deleted

## 2013-01-01 DIAGNOSIS — Z79899 Other long term (current) drug therapy: Secondary | ICD-10-CM

## 2013-01-01 DIAGNOSIS — I2510 Atherosclerotic heart disease of native coronary artery without angina pectoris: Secondary | ICD-10-CM

## 2013-01-14 ENCOUNTER — Other Ambulatory Visit (INDEPENDENT_AMBULATORY_CARE_PROVIDER_SITE_OTHER): Payer: Medicare Other

## 2013-01-14 ENCOUNTER — Encounter (INDEPENDENT_AMBULATORY_CARE_PROVIDER_SITE_OTHER): Payer: Self-pay

## 2013-01-14 DIAGNOSIS — I2510 Atherosclerotic heart disease of native coronary artery without angina pectoris: Secondary | ICD-10-CM

## 2013-01-14 DIAGNOSIS — E781 Pure hyperglyceridemia: Secondary | ICD-10-CM

## 2013-01-14 DIAGNOSIS — Z79899 Other long term (current) drug therapy: Secondary | ICD-10-CM | POA: Diagnosis not present

## 2013-01-14 LAB — ALT: ALT: 36 U/L (ref 0–53)

## 2013-01-15 LAB — NMR LIPOPROFILE WITH LIPIDS
Cholesterol, Total: 123 mg/dL (ref <200)
HDL PARTICLE NUMBER: 31.3 umol/L (ref >=30.5)
HDL SIZE: 8.6 nm — AB (ref >=9.2)
HDL-C: 34 mg/dL — AB (ref >=40)
LARGE VLDL-P: 6.7 nmol/L — AB (ref <=2.7)
LDL CALC: 61 mg/dL (ref <100)
LDL PARTICLE NUMBER: 727 nmol/L (ref <1000)
LDL Size: 19.6 nm — ABNORMAL LOW (ref >20.5)
LP-IR Score: 76 — ABNORMAL HIGH (ref <=45)
Large HDL-P: 2.5 umol/L — ABNORMAL LOW (ref >=4.8)
SMALL LDL PARTICLE NUMBER: 628 nmol/L — AB (ref <=527)
TRIGLYCERIDES: 141 mg/dL (ref <150)
VLDL SIZE: 50 nm — AB (ref <=46.6)

## 2013-01-22 ENCOUNTER — Encounter: Payer: Self-pay | Admitting: General Surgery

## 2013-01-22 ENCOUNTER — Other Ambulatory Visit: Payer: Self-pay | Admitting: General Surgery

## 2013-01-22 DIAGNOSIS — E782 Mixed hyperlipidemia: Secondary | ICD-10-CM

## 2013-01-23 ENCOUNTER — Encounter: Payer: Self-pay | Admitting: General Surgery

## 2013-01-23 DIAGNOSIS — I10 Essential (primary) hypertension: Secondary | ICD-10-CM

## 2013-01-23 DIAGNOSIS — E782 Mixed hyperlipidemia: Secondary | ICD-10-CM

## 2013-01-23 DIAGNOSIS — I2510 Atherosclerotic heart disease of native coronary artery without angina pectoris: Secondary | ICD-10-CM

## 2013-01-23 HISTORY — DX: Mixed hyperlipidemia: E78.2

## 2013-01-28 ENCOUNTER — Encounter: Payer: Self-pay | Admitting: General Surgery

## 2013-01-28 ENCOUNTER — Encounter: Payer: Self-pay | Admitting: Cardiology

## 2013-01-28 ENCOUNTER — Ambulatory Visit (INDEPENDENT_AMBULATORY_CARE_PROVIDER_SITE_OTHER): Payer: Medicare Other | Admitting: Cardiology

## 2013-01-28 VITALS — BP 111/86 | HR 53 | Ht 69.5 in | Wt 180.0 lb

## 2013-01-28 DIAGNOSIS — I10 Essential (primary) hypertension: Secondary | ICD-10-CM | POA: Diagnosis not present

## 2013-01-28 DIAGNOSIS — I2510 Atherosclerotic heart disease of native coronary artery without angina pectoris: Secondary | ICD-10-CM | POA: Diagnosis not present

## 2013-01-28 DIAGNOSIS — I77819 Aortic ectasia, unspecified site: Secondary | ICD-10-CM | POA: Insufficient documentation

## 2013-01-28 DIAGNOSIS — E782 Mixed hyperlipidemia: Secondary | ICD-10-CM

## 2013-01-28 NOTE — Progress Notes (Signed)
[place], [place], [place]  [address] Phone: [phone] Fax:  [phone]  Date:  01/28/2013   ID:  Sean Willis, DOB 02-18-1945, MRN [id number]  PCP:  [name], MD  Cardiologist:  Loney Hering, MD     History of Present Illness: Sean Willis is a 68 y.o. male with a history of ASCAD, HTN and dyslipidemia.  He is doing well.  He denies any chest pain, SOB, DOE, LE edema, dizziness, palpitations or syncope.  The only thing that is bothering him is gout.  He plays golf a few times weekly   Wt Readings from Last 3 Encounters:  01/28/13 180 lb (81.647 kg)  05/03/11 185 lb 10 oz (84.2 kg)     Past Medical History  Diagnosis Date  . Hypertension   . GERD (gastroesophageal reflux disease)   . ED (erectile dysfunction)   . Kidney stone     uric acid, Dr [name]  . Chronic insomnia   . Hypertriglyceridemia   . Osteoarthritis     of the knee  . DDD (degenerative disc disease), lumbar   . Colon polyp 10/11  . Gout   . Chest wall pain     Right side, muscular,ortho,MRI. U/S abd ok. On lyrica  . Acute MI 4/13    S/P CABG X4 Sx done in [place]  . Arrhythmia     Post op a-fib w episodes, several short burst of PAF at cardiac rehab-CHADS score is 1  . Carotid artery stenosis     Left 15-49 %  . Dilatation of aorta     Mild  . Coronary artery disease 04/2011    s/p CABG in setting of MI in [place], [place], with initial PTCA of OM2 then CABG with LIMA to LAD, SVG to D1, SVG to OM1,/R1 and SVG to distal RCA    Current Outpatient Prescriptions  Medication Sig Dispense Refill  . allopurinol (ZYLOPRIM) 100 MG tablet Take 100 mg by mouth daily.      [name] aspirin (ASPIRIN EC) 81 MG EC tablet Take 81 mg by mouth daily. Swallow whole.      . clopidogrel (PLAVIX) 75 MG tablet Take 75 mg by mouth daily.      [name] co-enzyme Q-10 30 MG capsule Take 300 mg by mouth daily.      . colchicine 0.6 MG tablet Take 0.6 mg by mouth daily as needed.      . doxepin  (SINEQUAN) 25 MG capsule Take 25 mg by mouth at bedtime.      [name] esomeprazole (NEXIUM) 40 MG capsule Take 40 mg by mouth daily as needed.      . fish oil-omega-3 fatty acids 1000 MG capsule Take 4 g by mouth daily.      . indomethacin (INDOCIN) 50 MG capsule Take 50 mg by mouth 2 (two) times daily as needed.      . loratadine (CLARITIN) 10 MG tablet Take 10 mg by mouth daily as needed.      . metoprolol tartrate (LOPRESSOR) 25 MG tablet Take 25 mg by mouth 2 (two) times daily. Take 25mg  twice daily      . pravastatin (PRAVACHOL) 80 MG tablet Take 80 mg by mouth every evening.      . vitamin B-12 (CYANOCOBALAMIN) 100 MCG tablet Take 100 mcg by mouth daily.      [name] zolpidem (AMBIEN) 10 MG tablet Take 5 mg by mouth at bedtime as needed.       No  current facility-administered medications for this visit.    Allergies:    Allergies  Allergen Reactions  . Celebrex [Celecoxib]     Upset stomach    Social History:  The patient  reports that he has never smoked. He does not have any smokeless tobacco history on file. He reports that he drinks alcohol. He reports that he does not use illicit drugs.   Family History:  The patient's family history includes Alzheimer's disease in his mother; Arrhythmia in his father and sister; CVA in his father; Cancer in his mother.   ROS:  Please see the history of present illness.      All other systems reviewed and negative.   PHYSICAL EXAM: VS:  BP 111/86  Pulse 53  Ht 5' 9.5" (1.765 m)  Wt 180 lb (81.647 kg)  BMI 26.21 kg/m2 Well nourished, well developed, in no acute distress HEENT: normal Neck: no JVD Cardiac:  normal S1, S2; RRR; no murmur Lungs:  clear to auscultation bilaterally, no wheezing, rhonchi or rales Abd: soft, nontender, no hepatomegaly Ext: no edema Skin: warm and dry Neuro:  CNs 2-12 intact, no focal abnormalities noted  EKG: sinus bradycardia with no ST changes     ASSESSMENT AND PLAN:  1. ASCAD with no angina  - continue  ASA/Plavix 2. HTN - well controlled  - continue metprolol 3. Dyslipidemia - lipids at goal  - continue pravastatin 4.  Dilated aortic root  - 2D echo to reassess size  Followup with me in 6 months  Signed, Fransico Him, MD 01/28/2013 8:59 AM

## 2013-01-28 NOTE — Patient Instructions (Signed)
Your physician recommends that you continue on your current medications as directed. Please refer to the Current Medication list given to you today.  Your physician has requested that you have an echocardiogram. Echocardiography is a painless test that uses sound waves to create images of your heart. It provides your doctor with information about the size and shape of your heart and how well your heart's chambers and valves are working. This procedure takes approximately one hour. There are no restrictions for this procedure.  Your physician wants you to follow-up in: 6 Months with Dr Mallie Snooks will receive a reminder letter in the mail two months in advance. If you don't receive a letter, please call our office to schedule the follow-up appointment.

## 2013-02-17 ENCOUNTER — Other Ambulatory Visit (HOSPITAL_COMMUNITY): Payer: Medicare Other

## 2013-03-03 DIAGNOSIS — M109 Gout, unspecified: Secondary | ICD-10-CM | POA: Diagnosis not present

## 2013-03-05 ENCOUNTER — Ambulatory Visit (HOSPITAL_COMMUNITY): Payer: Medicare Other | Attending: Cardiovascular Disease | Admitting: Radiology

## 2013-03-05 ENCOUNTER — Encounter: Payer: Self-pay | Admitting: Cardiovascular Disease

## 2013-03-05 DIAGNOSIS — I77819 Aortic ectasia, unspecified site: Secondary | ICD-10-CM

## 2013-03-05 DIAGNOSIS — I2510 Atherosclerotic heart disease of native coronary artery without angina pectoris: Secondary | ICD-10-CM | POA: Diagnosis not present

## 2013-03-05 NOTE — Progress Notes (Signed)
Echocardiogram Performed. 

## 2013-03-06 ENCOUNTER — Telehealth: Payer: Self-pay | Admitting: Cardiology

## 2013-03-06 NOTE — Telephone Encounter (Signed)
New message ° ° ° ° ° °Returning Sean Willis's call °

## 2013-03-09 ENCOUNTER — Other Ambulatory Visit: Payer: Self-pay | Admitting: General Surgery

## 2013-03-09 DIAGNOSIS — I77819 Aortic ectasia, unspecified site: Secondary | ICD-10-CM

## 2013-03-09 NOTE — Telephone Encounter (Signed)
Spoke to pt and made aware of tests results.

## 2013-03-11 DIAGNOSIS — M109 Gout, unspecified: Secondary | ICD-10-CM | POA: Diagnosis not present

## 2013-03-17 ENCOUNTER — Other Ambulatory Visit: Payer: Self-pay | Admitting: General Surgery

## 2013-03-17 DIAGNOSIS — I10 Essential (primary) hypertension: Secondary | ICD-10-CM

## 2013-03-19 ENCOUNTER — Other Ambulatory Visit: Payer: Medicare Other

## 2013-03-20 ENCOUNTER — Other Ambulatory Visit (INDEPENDENT_AMBULATORY_CARE_PROVIDER_SITE_OTHER): Payer: Medicare Other

## 2013-03-20 DIAGNOSIS — I10 Essential (primary) hypertension: Secondary | ICD-10-CM

## 2013-03-20 LAB — BASIC METABOLIC PANEL
BUN: 27 mg/dL — ABNORMAL HIGH (ref 6–23)
CALCIUM: 9.6 mg/dL (ref 8.4–10.5)
CO2: 29 mEq/L (ref 19–32)
CREATININE: 1.4 mg/dL (ref 0.4–1.5)
Chloride: 101 mEq/L (ref 96–112)
GFR: 53.17 mL/min — AB (ref >60.00)
GLUCOSE: 136 mg/dL — AB (ref 70–99)
POTASSIUM: 5.1 meq/L (ref 3.5–5.1)
Sodium: 137 mEq/L (ref 135–145)

## 2013-03-23 ENCOUNTER — Encounter: Payer: Self-pay | Admitting: General Surgery

## 2013-03-25 ENCOUNTER — Other Ambulatory Visit: Payer: Medicare Other

## 2013-03-27 ENCOUNTER — Ambulatory Visit (INDEPENDENT_AMBULATORY_CARE_PROVIDER_SITE_OTHER)
Admission: RE | Admit: 2013-03-27 | Discharge: 2013-03-27 | Disposition: A | Discharge status: Home / Self Care | Payer: Medicare Other | Source: Ambulatory Visit | Attending: Cardiology | Admitting: Cardiology

## 2013-03-27 ENCOUNTER — Encounter: Payer: Self-pay | Admitting: Cardiology

## 2013-03-27 ENCOUNTER — Ambulatory Visit (INDEPENDENT_AMBULATORY_CARE_PROVIDER_SITE_OTHER): Payer: Medicare Other | Admitting: Cardiology

## 2013-03-27 VITALS — BP 103/74 | HR 64 | Ht 69.5 in | Wt 179.0 lb

## 2013-03-27 DIAGNOSIS — I77819 Aortic ectasia, unspecified site: Secondary | ICD-10-CM | POA: Diagnosis not present

## 2013-03-27 DIAGNOSIS — I2510 Atherosclerotic heart disease of native coronary artery without angina pectoris: Secondary | ICD-10-CM

## 2013-03-27 DIAGNOSIS — R42 Dizziness and giddiness: Secondary | ICD-10-CM | POA: Diagnosis not present

## 2013-03-27 DIAGNOSIS — I712 Thoracic aortic aneurysm, without rupture, unspecified: Secondary | ICD-10-CM | POA: Diagnosis not present

## 2013-03-27 LAB — CBC WITH DIFFERENTIAL/PLATELET
BASOS ABS: 0.1 10*3/uL (ref 0.0–0.1)
Basophils Relative: 1 % (ref 0–1)
EOS ABS: 0.3 10*3/uL (ref 0.0–0.7)
EOS PCT: 2 % (ref 0–5)
HEMATOCRIT: 49.1 % (ref 39.0–52.0)
HEMOGLOBIN: 16.7 g/dL (ref 13.0–17.0)
LYMPHS ABS: 1.2 10*3/uL (ref 0.7–4.0)
LYMPHS PCT: 9 % — AB (ref 12–46)
MCH: 31.9 pg (ref 26.0–34.0)
MCHC: 34 g/dL (ref 30.0–36.0)
MCV: 93.7 fL (ref 78.0–100.0)
MONO ABS: 0.9 10*3/uL (ref 0.1–1.0)
MONOS PCT: 7 % (ref 3–12)
Neutro Abs: 10.7 10*3/uL — ABNORMAL HIGH (ref 1.7–7.7)
Neutrophils Relative %: 81 % — ABNORMAL HIGH (ref 43–77)
Platelets: 132 10*3/uL — ABNORMAL LOW (ref 150–400)
RBC: 5.24 MIL/uL (ref 4.22–5.81)
RDW: 14.3 % (ref 11.5–15.5)
WBC: 13.2 10*3/uL — ABNORMAL HIGH (ref 4.0–10.5)

## 2013-03-27 IMAGING — CT CT ANGIO CHEST
2 of 5 series · 19 of 36 positions shown · IV contrast (omnipaque)
Comparison: CT ABDOMEN WO/W CM dated [DATE]

CLINICAL DATA: Evaluate aortic root dilatation.

EXAM:
CT ANGIOGRAPHY CHEST WITH CONTRAST
TECHNIQUE: Multidetector CT imaging of the chest was performed using the
standard protocol during bolus administration of intravenous
contrast. Multiplanar CT image reconstructions and MIPs were
obtained to evaluate the vascular anatomy.
CONTRAST:  100mL OMNIPAQUE IOHEXOL 350 MG/ML SOLN

[Series 4: cta chest w/cm 3mm · axial · 0.69mm/px · z∈[-232,-7]mm · 18 of 81 slices shown]
[im 3/81  lung]
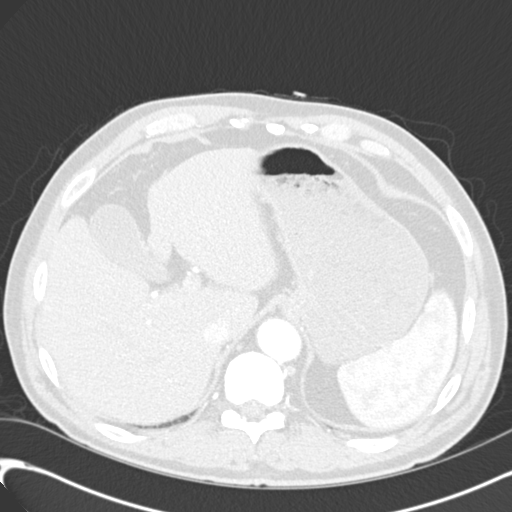
[im 9/81  mediastinal]
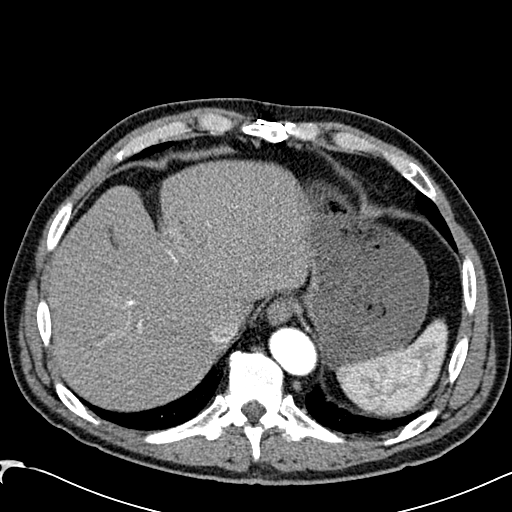
[im 12/81  lung]
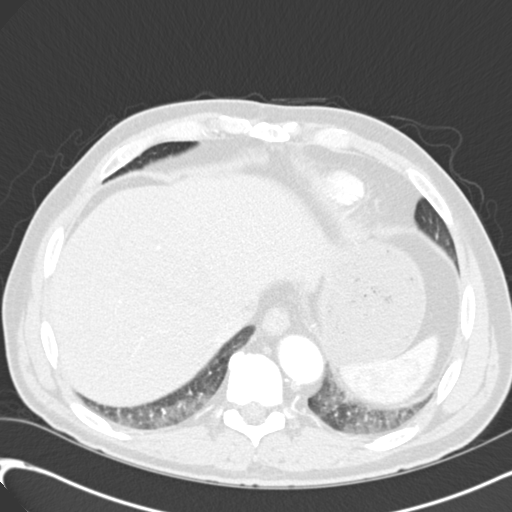
[im 18/81  mediastinal]
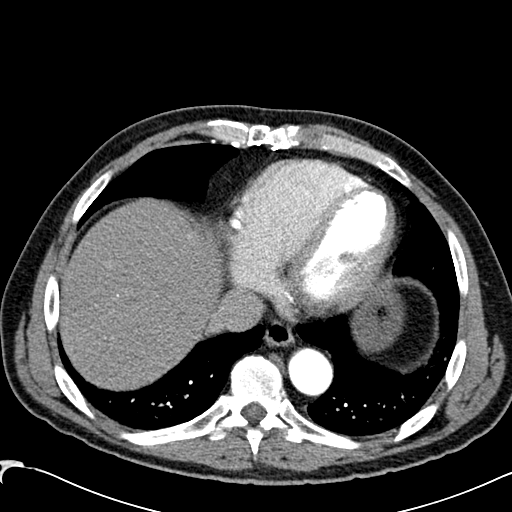
[im 21/81  lung]
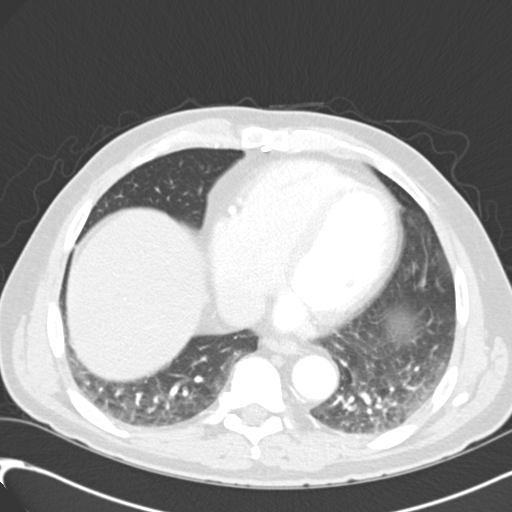
[im 24/81  mediastinal]
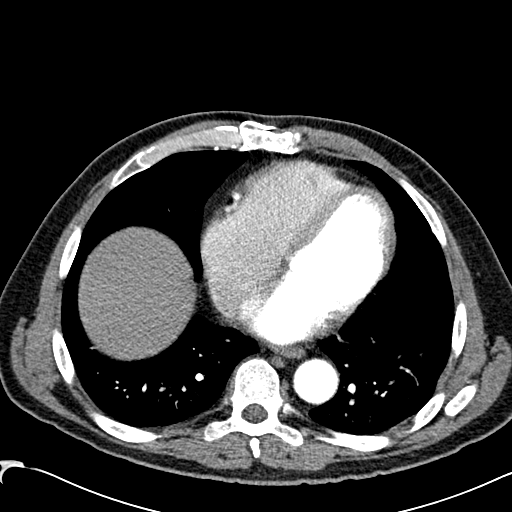
[im 30/81  lung]
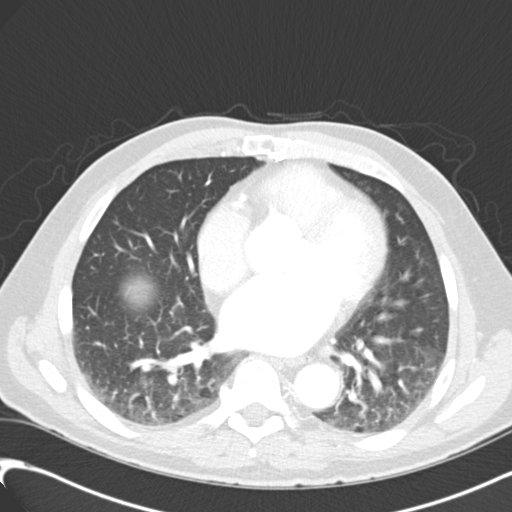
[im 33/81  mediastinal]
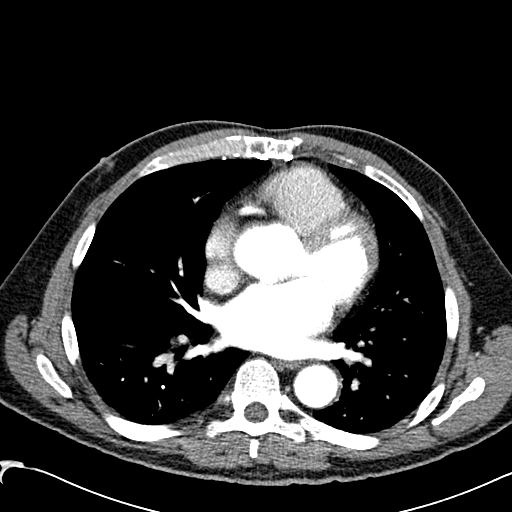
[im 39/81  lung]
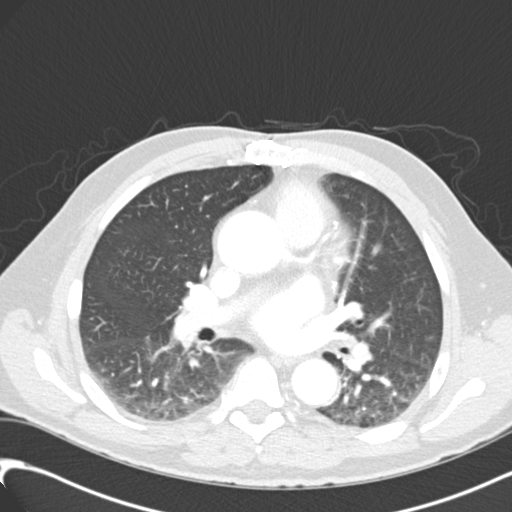
[im 42/81  mediastinal]
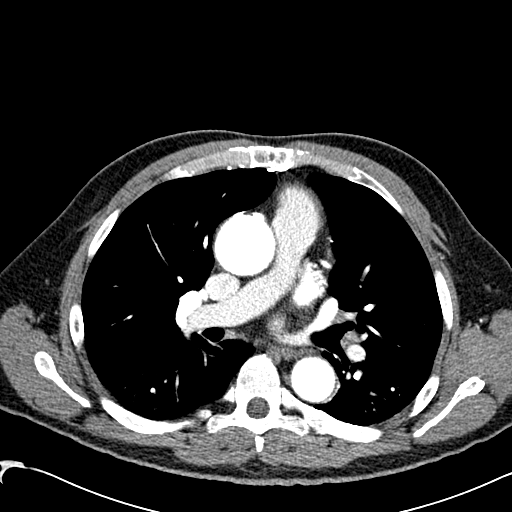
[im 48/81  lung]
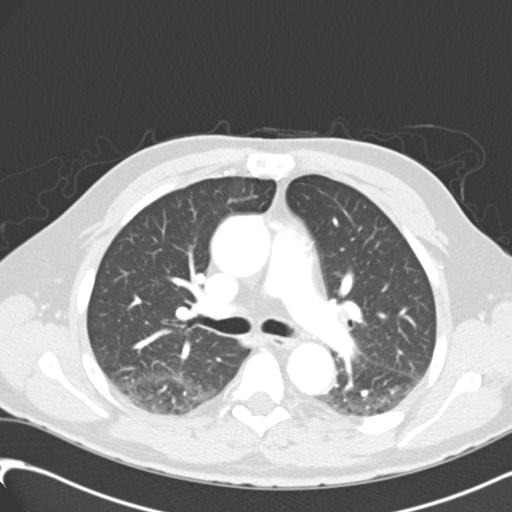
[im 51/81  mediastinal]
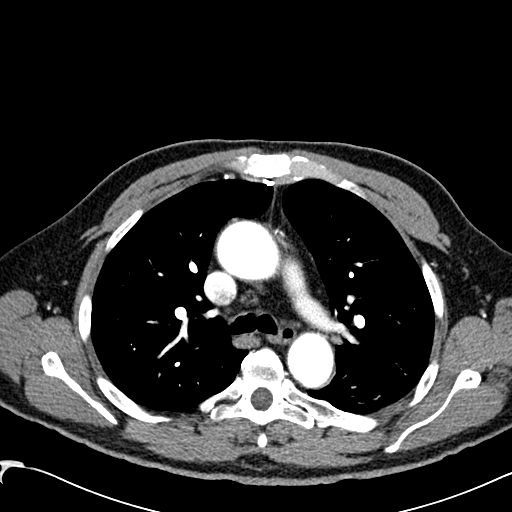
[im 57/81  lung]
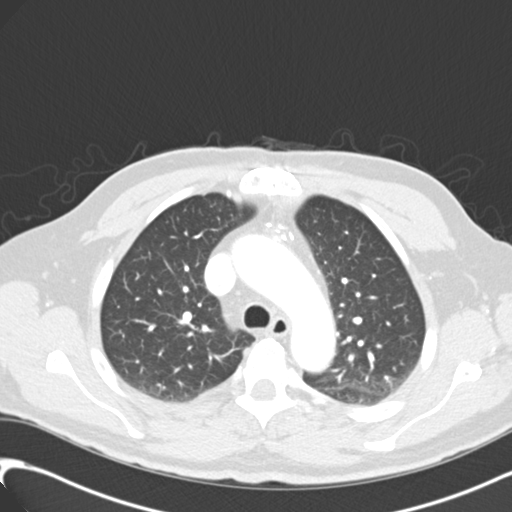
[im 60/81  mediastinal]
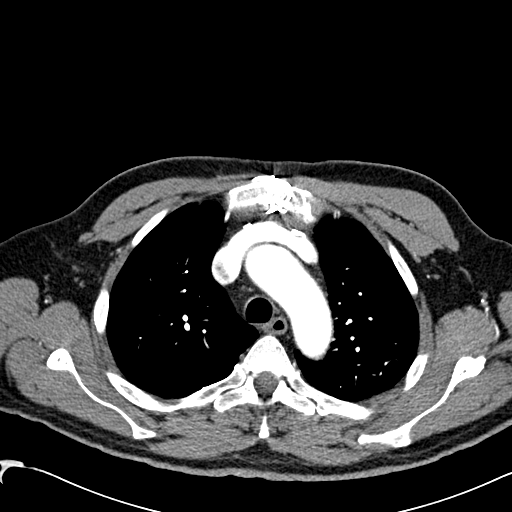
[im 63/81  lung]
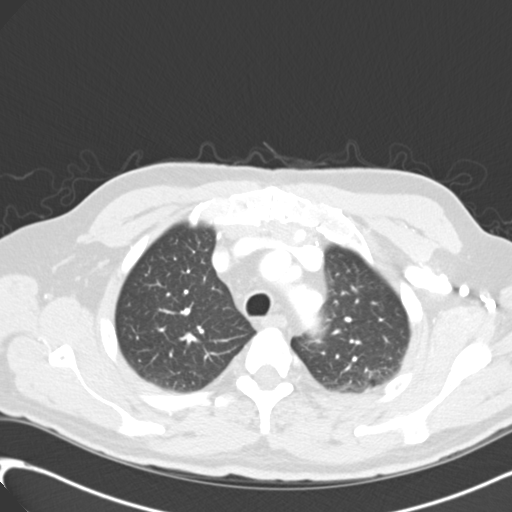
[im 69/81  mediastinal]
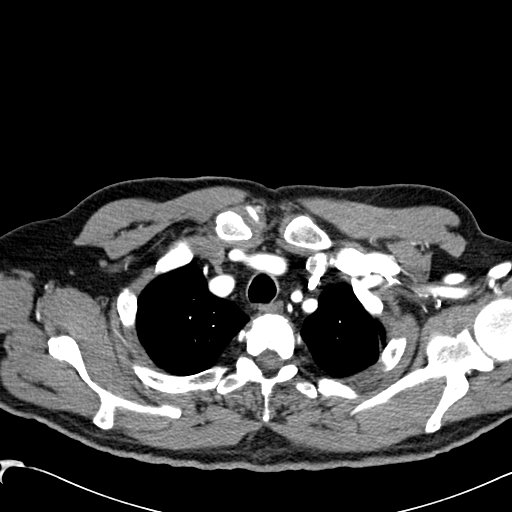
[im 72/81  lung]
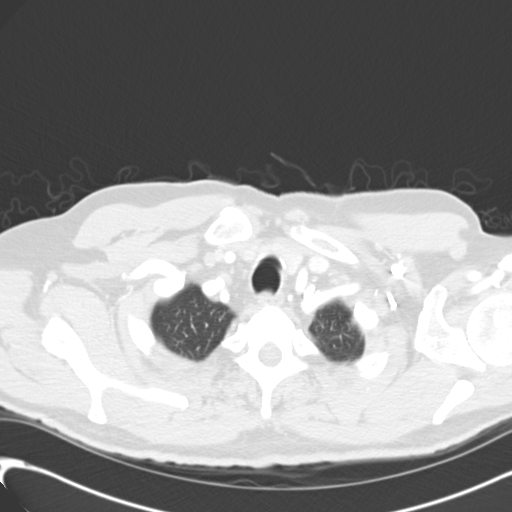
[im 78/81  mediastinal]
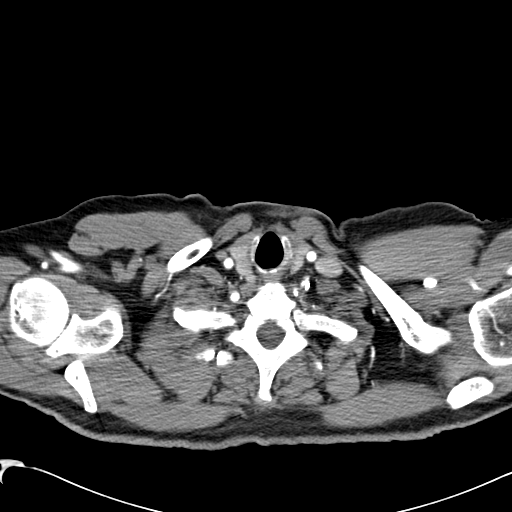

[Series 602: cor mpr · coronal · 0.69mm/px · 1 of 104 slices shown]
[im 52/104  mediastinal]
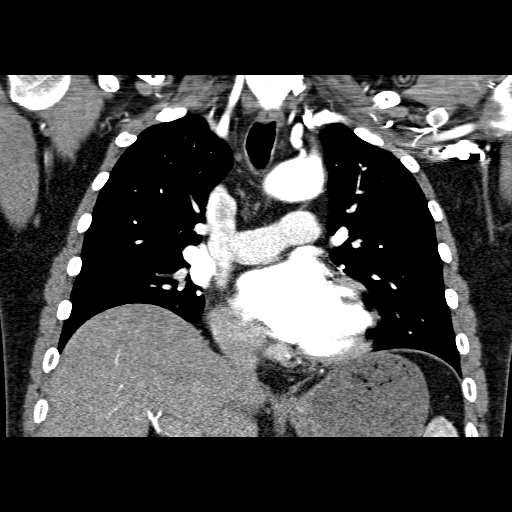

[19 of 36 positions shown; findings below may reference images not displayed]

FINDINGS: No pathologically enlarged mediastinal, hilar or axillary lymph
nodes. Proximal ascending aorta measures up to 4.4 cm. Heart is
mildly enlarged. No pericardial effusion.

There is mild ground-glass opacity in the dependent portions of the
lungs bilaterally, likely due to slight expiratory phase imaging. No
pleural fluid. Airway is otherwise unremarkable.

Incidental imaging of the upper abdomen shows the visualized
portions of the liver, gallbladder, spleen and stomach to be grossly
unremarkable. No worrisome lytic or sclerotic lesions.

Review of the MIP images confirms the above findings.
IMPRESSION: 1. Ascending aortic aneurysm.
2. No acute findings in the chest

## 2013-03-27 MED ORDER — IOHEXOL 350 MG/ML SOLN
100.0000 mL | Freq: Once | INTRAVENOUS | Status: AC | PRN
  Administered 2013-03-27: 100 mL via INTRAVENOUS

## 2013-03-27 NOTE — Progress Notes (Signed)
Sean Willis Date of Birth:  Nov 16, 1945 [address] [place], [place]  [address] [phone]         Fax   [phone]  History of Present Illness: This 68 year old gentleman is seen as a DOD work in office visit.  He is a cardiac patient of Dr. Fransico Him.  His medical doctor is Dr. Deforest Hoyles.  He came in today because of an episode of dizziness which occurred about 4 hours ago.  He was at the [employer] store at about 11 AM just walking around and killing time until it was time for him to come for his previously scheduled CT scan of the chest later in the afternoon.  While he was standing quietly he became lightheaded and dizzy and was diaphoretic.  He did not have syncope or loss of consciousness.  He went and sat down.  After about 10 or 15 minutes he felt better.  He had eaten in a restaurant earlier in the morning and wondered if he might have had a touch of food poisoning although he did not have any diarrhea or vomiting.  He did not have any chest pain.  He states that he typically runs a low blood pressure.  He does have a past history of myocardial infarction followed by CABG x4 several years ago. He had an echocardiogram on 03/05/13 which showed normal LV function with an ejection fraction of 50-55% and there was mild aortic insufficiency.  He has a known mildly dilated aortic root.  The patient also has a history of gout and has been taking Indocin.  Recent blood work reveals elevated BUN.  He states that he does not drink much water.   Current Outpatient Prescriptions  Medication Sig Dispense Refill  . allopurinol (ZYLOPRIM) 100 MG tablet Take 100 mg by mouth daily.      [name] aspirin (ASPIRIN EC) 81 MG EC tablet Take 81 mg by mouth daily. Swallow whole.      . clopidogrel (PLAVIX) 75 MG tablet Take 75 mg by mouth daily.      [name] co-enzyme Q-10 30 MG capsule Take 300 mg by mouth daily.      . colchicine 0.6 MG tablet Take 0.6 mg by mouth daily as needed.      . doxepin  (SINEQUAN) 25 MG capsule Take 25 mg by mouth at bedtime.      [name] esomeprazole (NEXIUM) 40 MG capsule Take 40 mg by mouth daily as needed.      . fish oil-omega-3 fatty acids 1000 MG capsule Take 4 g by mouth daily.      . indomethacin (INDOCIN) 50 MG capsule Take 50 mg by mouth 2 (two) times daily as needed.      . loratadine (CLARITIN) 10 MG tablet Take 10 mg by mouth daily as needed.      . metoprolol tartrate (LOPRESSOR) 25 MG tablet Take 25 mg by mouth as directed. 1/2 TABLET TWICE DAILY      . pravastatin (PRAVACHOL) 80 MG tablet Take 80 mg by mouth every evening.      . vitamin B-12 (CYANOCOBALAMIN) 100 MCG tablet Take 100 mcg by mouth daily.      [name] zolpidem (AMBIEN) 10 MG tablet Take 5 mg by mouth at bedtime as needed.       No current facility-administered medications for this visit.    Allergies  Allergen Reactions  . Celebrex [Celecoxib]     Upset stomach    Patient  Active Problem List   Diagnosis Date Noted  . Dilatation of aorta   . CAD (coronary artery disease) 01/23/2013  . Essential hypertension, benign 01/23/2013  . Mixed hyperlipidemia 01/23/2013  . Coronary artery disease 04/02/2011    History  Smoking status  . Never Smoker   Smokeless tobacco  . Not on file    History  Alcohol Use  . Yes    Comment: yes occassionally    Family History  Problem Relation Age of Onset  . Alzheimer's disease Mother   . Cancer Mother   . CVA Father   . Arrhythmia Father   . Arrhythmia Sister     Review of Systems: Constitutional: no fever chills diaphoresis or fatigue or change in weight.  Head and neck: no hearing loss, no epistaxis, no photophobia or visual disturbance. Respiratory: No cough, shortness of breath or wheezing. Cardiovascular: No chest pain peripheral edema, palpitations. Gastrointestinal: No abdominal distention, no abdominal pain, no change in bowel habits hematochezia or melena. Genitourinary: No dysuria, no frequency, no urgency, no  nocturia. Musculoskeletal:No arthralgias, no back pain, no gait disturbance or myalgias. Neurological: Positive for dizziness Hematologic: No lymphadenopathy, no easy bruising. Psychiatric: No confusion, no hallucinations, no sleep disturbance.    Physical Exam: Filed Vitals:   03/27/13 1508  BP: 103/74  Pulse: 64   the general appearance reveals a well-developed well-nourished gentleman in no distress.  Skin is warm and dry.  His color is good.The head and neck exam reveals pupils equal and reactive.  Extraocular movements are full.  There is no scleral icterus.  The mouth and pharynx are normal.  The neck is supple.  The carotids reveal no bruits.  The jugular venous pressure is normal.  The  thyroid is not enlarged.  There is no lymphadenopathy.  The chest is clear to percussion and auscultation.  There are no rales or rhonchi.  Expansion of the chest is symmetrical.  The precordium is quiet.  The first heart sound is normal.  The second heart sound is physiologically split.  There is no murmur gallop rub or click.  There is no abnormal lift or heave.  The abdomen is soft and nontender.  The bowel sounds are normal.  The liver and spleen are not enlarged.  There are no abdominal masses.  There are no abdominal bruits.  Extremities reveal good pedal pulses.  There is no phlebitis or edema.  There is no cyanosis or clubbing.  Strength is normal and symmetrical in all extremities.  There is no lateralizing weakness.  There are no sensory deficits.  The skin is warm and dry.  There is no rash.  EKG shows normal sinus rhythm and is within normal limits   Assessment / Plan: My impression is that the patient probably had a vasovagal reaction.  His blood pressure is chronically low.  He does not drink much water.  He does have mild elevation of BUN and creatinine which may reflect recent use of Indocin for suppression of gout pain in his foot. My recommendation is that he decrease his metoprolol to  just one half tablet twice a day or 12.5 mg twice a day to allow a higher ambient blood pressure.  He also needs to make a conscious effort to drink more water. We are checking a baseline CBC and basal metabolic panel today I have asked him to followup with [name] in the next several weeks

## 2013-03-27 NOTE — Patient Instructions (Addendum)
Will obtain labs today and call you with the results (BMET/CBC)  NO METOPROLOL TONIGHT AND STARTING TOMORROW DECREASE METOPROLOL TO 1/2 TABLET TWICE A DAY  INCREASE YOUR WATER INTAKE   Your physician recommends that you schedule a follow-up appointment in: [place]

## 2013-03-28 LAB — BASIC METABOLIC PANEL
BUN: 26 mg/dL — ABNORMAL HIGH (ref 6–23)
CALCIUM: 8.7 mg/dL (ref 8.4–10.5)
CO2: 24 mEq/L (ref 19–32)
CREATININE: 1.3 mg/dL (ref 0.50–1.35)
Chloride: 102 mEq/L (ref 96–112)
GLUCOSE: 93 mg/dL (ref 70–99)
Potassium: 5 mEq/L (ref 3.5–5.3)
Sodium: 133 mEq/L — ABNORMAL LOW (ref 135–145)

## 2013-03-30 ENCOUNTER — Telehealth: Payer: Self-pay | Admitting: *Deleted

## 2013-03-30 NOTE — Telephone Encounter (Signed)
Advised patient of lab results and sent labs to PCP and Dr Drema Dallas (orthopedic)

## 2013-03-30 NOTE — Telephone Encounter (Signed)
Message copied by Earvin Hansen on Mon Mar 30, 2013  4:06 PM ------      Message from: Darlin Coco      Created: Sat Mar 28, 2013  8:33 PM       Please report. Kidney function tests suggest kidneys slightly dry. Needs to increase water intake. The sodium level is low. Since BP is also low, would increase sodium intake slightly. There was no anemia.  The WBC count was elevated. Would follow up with another CBC at PCP office next week. The WBC count may be elevated from acute gouty arthritis if still present.       ------

## 2013-04-01 ENCOUNTER — Other Ambulatory Visit: Payer: Self-pay | Admitting: Cardiology

## 2013-04-01 ENCOUNTER — Telehealth: Payer: Self-pay | Admitting: Cardiology

## 2013-04-01 DIAGNOSIS — I519 Heart disease, unspecified: Secondary | ICD-10-CM

## 2013-04-01 DIAGNOSIS — I77810 Thoracic aortic ectasia: Secondary | ICD-10-CM

## 2013-04-01 DIAGNOSIS — M79609 Pain in unspecified limb: Secondary | ICD-10-CM | POA: Diagnosis not present

## 2013-04-01 DIAGNOSIS — I2510 Atherosclerotic heart disease of native coronary artery without angina pectoris: Secondary | ICD-10-CM

## 2013-04-01 DIAGNOSIS — M109 Gout, unspecified: Secondary | ICD-10-CM | POA: Diagnosis not present

## 2013-04-01 NOTE — Addendum Note (Signed)
Addended byUlla Potash H on: 04/01/2013 02:52 PM   Modules accepted: Orders

## 2013-04-01 NOTE — Telephone Encounter (Signed)
Referral sent 

## 2013-04-01 NOTE — Telephone Encounter (Signed)
Message copied by [name] Vittoria Noreen H on Wed Apr 01, 2013  1:32 PM ------      Message from: Fransico Him R      Created: Mon Mar 30, 2013 11:06 AM       Please let patient know that chest CT showed moderately dilated ascending aorta and I would like him referred to CVTS surgery to follow ------

## 2013-04-14 ENCOUNTER — Ambulatory Visit: Payer: Medicare Other | Admitting: Cardiology

## 2013-04-15 ENCOUNTER — Encounter: Payer: Self-pay | Admitting: Surgery

## 2013-04-15 ENCOUNTER — Institutional Professional Consult (permissible substitution) (INDEPENDENT_AMBULATORY_CARE_PROVIDER_SITE_OTHER): Payer: Medicare Other | Admitting: Surgery

## 2013-04-15 VITALS — BP 125/80 | HR 64 | Resp 16 | Ht 69.0 in | Wt 179.0 lb

## 2013-04-15 DIAGNOSIS — I712 Thoracic aortic aneurysm, without rupture, unspecified: Secondary | ICD-10-CM

## 2013-04-15 NOTE — Progress Notes (Signed)
PCP is [name], MD Referring Provider is Sueanne Margarita, MD  Chief Complaint  Patient presents with  . TAA    eval and treat.Marland KitchenMarland KitchenCTA CHEST...ECHO 03/05/13    HPI:  The patient is a 68 year old gentleman who suffered an acute MI in 04/2011 while on vacation in [place] and underwent PTCA of OM2 followed by CABG x 5. He returned home to [facility] and has been followed by [name] ever since. An echo showed a dilated aortic root and this was followed up with echo last month that showed an aortic root diameter of 34 mm with a normal tricuspid aortic valve with mild AI. He had a CTA of the chest which showed that the ascending aorta had a maximum diameter of 44 mm. He was seen a few weeks ago by Dr. Mare Ferrari after he developed dizziness that lasted 15 minutes while walking around in [place]. He had a low normal BP and HR and his lopressor dose was decreased to 12.5 bid from 25 bid. It was felt that he may have been dehydrated. He has had no further symptoms.  Past Medical History  Diagnosis Date  . Hypertension   . GERD (gastroesophageal reflux disease)   . ED (erectile dysfunction)   . Kidney stone     uric acid, Dr [name]  . Chronic insomnia   . Hypertriglyceridemia   . Osteoarthritis     of the knee  . DDD (degenerative disc disease), lumbar   . Colon polyp 10/11  . Gout   . Chest wall pain     Right side, muscular,ortho,MRI. U/S abd ok. On lyrica  . Acute MI 4/13    S/P CABG X4 Sx done in [place]  . Arrhythmia     Post op a-fib w episodes, several short burst of PAF at cardiac rehab-CHADS score is 1  . Carotid artery stenosis     Left 15-49 %  . Dilatation of aorta     Mild  . Coronary artery disease 04/2011    s/p CABG in setting of MI in [place], [place], with initial PTCA of OM2 then CABG with LIMA to LAD, SVG to D1, SVG to OM1,/R1 and SVG to distal RCA    Past Surgical History  Procedure Laterality Date  . Coronary artery bypass graft  04/2011   Severe multivessel ASCAD s/p Stemi w PTCA of OM2 then s/p CABG w LIMA to LAD,SVG to OM1 /RI and SVG to Distal RCA   . Kidney stone surgery      Family History  Problem Relation Age of Onset  . Alzheimer's disease Mother   . Cancer Mother   . CVA Father   . Arrhythmia Father   . Arrhythmia Sister     Social History History  Substance Use Topics  . Smoking status: Never Smoker   . Smokeless tobacco: Not on file  . Alcohol Use: Yes     Comment: yes occassionally    Current Outpatient Prescriptions  Medication Sig Dispense Refill  . allopurinol (ZYLOPRIM) 100 MG tablet Take 100 mg by mouth daily.      [name] aspirin (ASPIRIN EC) 81 MG EC tablet Take 81 mg by mouth daily. Swallow whole.      . clopidogrel (PLAVIX) 75 MG tablet Take 75 mg by mouth daily.      [name] co-enzyme Q-10 30 MG capsule Take 300 mg by mouth daily.      . colchicine 0.6 MG tablet Take  0.6 mg by mouth daily as needed.      . doxepin (SINEQUAN) 25 MG capsule Take 25 mg by mouth at bedtime.      [name] esomeprazole (NEXIUM) 40 MG capsule Take 40 mg by mouth daily as needed.      . fish oil-omega-3 fatty acids 1000 MG capsule Take 4 g by mouth daily.      [name] HYDROcodone-acetaminophen (NORCO) 7.5-325 MG per tablet Take 1 tablet by mouth every 6 (six) hours as needed for moderate pain.      . indomethacin (INDOCIN) 50 MG capsule Take 50 mg by mouth 2 (two) times daily as needed.      . loratadine (CLARITIN) 10 MG tablet Take 10 mg by mouth daily as needed.      . metoprolol tartrate (LOPRESSOR) 25 MG tablet Take 25 mg by mouth as directed. 1/2 TABLET TWICE DAILY      . pravastatin (PRAVACHOL) 80 MG tablet Take 80 mg by mouth every evening.      . vitamin B-12 (CYANOCOBALAMIN) 100 MCG tablet Take 100 mcg by mouth daily.      [name] zolpidem (AMBIEN) 10 MG tablet Take 5 mg by mouth at bedtime as needed.       No current facility-administered medications for this visit.    Allergies  Allergen Reactions  . Celebrex [Celecoxib]      Upset stomach    Review of Systems  Constitutional: Negative for activity change and fatigue.  HENT: Negative.   Eyes: Negative.   Respiratory: Negative for chest tightness and shortness of breath.   Cardiovascular: Negative for chest pain, palpitations and leg swelling.  Gastrointestinal: Negative.   Endocrine: Negative.   Genitourinary: Negative.   Musculoskeletal: Negative.   Skin: Negative.   Allergic/Immunologic: Negative.   Neurological: Negative.   Hematological: Negative.   Psychiatric/Behavioral: Negative.     BP 125/80  Pulse 64  Resp 16  Ht 5\' 9"  (1.753 m)  Wt 179 lb (81.194 kg)  BMI 26.42 kg/m2  SpO2 99% Physical Exam  Constitutional: He is oriented to person, place, and time. He appears well-developed and well-nourished. No distress.  HENT:  Head: Normocephalic and atraumatic.  Mouth/Throat: Oropharynx is clear and moist.  Eyes: EOM are normal. Pupils are equal, round, and reactive to light.  Neck: Normal range of motion. Neck supple. No JVD present. No thyromegaly present.  Cardiovascular: Normal rate, regular rhythm, normal heart sounds and intact distal pulses.   No murmur heard. Pulmonary/Chest: Effort normal and breath sounds normal. No respiratory distress. He has no rales. He exhibits no tenderness.  Sternotomy scar  Abdominal: Soft. Bowel sounds are normal. He exhibits no distension and no mass. There is no tenderness.  Musculoskeletal: Normal range of motion. He exhibits no edema.  Lymphadenopathy:    He has no cervical adenopathy.  Neurological: He is alert and oriented to person, place, and time. He has normal strength. No cranial nerve deficit or sensory deficit.  Skin: Skin is warm and dry.  Psychiatric: He has a normal mood and affect.     Diagnostic Tests:  CLINICAL DATA: Evaluate aortic root dilatation.  EXAM:  CT ANGIOGRAPHY CHEST WITH CONTRAST  TECHNIQUE:  Multidetector CT imaging of the chest was performed using the  standard  protocol during bolus administration of intravenous  contrast. Multiplanar CT image reconstructions and MIPs were  obtained to evaluate the vascular anatomy.  CONTRAST: 185mL OMNIPAQUE IOHEXOL 350 MG/ML SOLN  COMPARISON: CT ABDOMEN WO/W CM dated 08/06/2011  FINDINGS:  No pathologically enlarged mediastinal, hilar or axillary lymph  nodes. Proximal ascending aorta measures up to 4.4 cm. Heart is  mildly enlarged. No pericardial effusion.  There is mild ground-glass opacity in the dependent portions of the  lungs bilaterally, likely due to slight expiratory phase imaging. No  pleural fluid. Airway is otherwise unremarkable.  Incidental imaging of the upper abdomen shows the visualized  portions of the liver, gallbladder, spleen and stomach to be grossly  unremarkable. No worrisome lytic or sclerotic lesions.  Review of the MIP images confirms the above findings.  IMPRESSION:  1. Ascending aortic aneurysm.  2. No acute findings in the chest  Electronically Signed  By: Lorin Picket M.D.  On: 03/27/2013 14:47    *[facility]* [address], [place] [address] [phone]  ------------------------------------------------------------ Echocardiography  Patient: Jousha, Schwandt MR #: [id number] Study Date: 03/05/2013 Gender: M Age: 70 Height: 175.3cm Weight: 81.6kg BSA: 1.31m^2 Pt. Status: Room:  ORDERING Fransico Him, MD REFERRING Fransico Him, MD ATTENDING Nahser, Philip SONOGRAPHER McFatter, [facility] PERFORMING [facility], Outpatient cc:  ------------------------------------------------------------ LV EF: 50% - 55%  ------------------------------------------------------------ Indications: CAD of native vessels 414.01.  ------------------------------------------------------------ History: PMH: Acquired from the patient and from the patient's chart. Coronary artery disease. Risk factors: Hypertension. Dyslipidemia. Dilated ascending  aorta.  ------------------------------------------------------------ Study Conclusions  - Left ventricle: The cavity size was normal. Systolic function was normal. The estimated ejection fraction was in the range of 50% to 55%. Wall motion was normal; there were no regional wall motion abnormalities. - Aortic valve: Mild regurgitation. - Mitral valve: Mild regurgitation. - Left atrium: The atrium was mildly dilated. - Right atrium: The atrium was mildly dilated.  ------------------------------------------------------------ Labs, prior tests, procedures, and surgery: Coronary artery bypass grafting (April 2013).  Echocardiography. M-mode, complete 2D, spectral Doppler, and color Doppler. Height: Height: 175.3cm. Height: 69in. Weight: Weight: 81.6kg. Weight: 179.6lb. Body mass index: BMI: 26.6kg/m^2. Body surface area: BSA: 1.56m^2. Blood pressure: 111/86. Patient status: Outpatient. Location: [facility]  ------------------------------------------------------------  ------------------------------------------------------------ Left ventricle: The cavity size was normal. Systolic function was normal. The estimated ejection fraction was in the range of 50% to 55%. Wall motion was normal; there were no regional wall motion abnormalities.  ------------------------------------------------------------ Aortic valve: Trileaflet; normal thickness leaflets. Mobility was not restricted. Doppler: Transvalvular velocity was within the normal range. There was no stenosis. Mild regurgitation.  ------------------------------------------------------------ Aorta: Aortic root: The aortic root was normal in size. Ascending aorta: The ascending aorta was mildly dilated.  ------------------------------------------------------------ Mitral valve: Structurally normal valve. Mobility was not restricted. Doppler: Transvalvular velocity was within the normal range. There was no evidence for  stenosis. Mild regurgitation.  ------------------------------------------------------------ Left atrium: The atrium was mildly dilated.  ------------------------------------------------------------ Right ventricle: The cavity size was normal. Wall thickness was normal. Systolic function was normal.  ------------------------------------------------------------ Pulmonic valve: Structurally normal valve. Cusp separation was normal. Doppler: Transvalvular velocity was within the normal range. There was no evidence for stenosis. No significant regurgitation.  ------------------------------------------------------------ Tricuspid valve: Structurally normal valve. Doppler: Transvalvular velocity was within the normal range. Trivial regurgitation.  ------------------------------------------------------------ Pulmonary artery: The main pulmonary artery was normal-sized. Systolic pressure was within the normal range.  ------------------------------------------------------------ Right atrium: The atrium was mildly dilated.  ------------------------------------------------------------ Pericardium: There was no pericardial effusion.  ------------------------------------------------------------ Systemic veins: Inferior vena cava: The vessel was normal in size.  ------------------------------------------------------------  2D measurements Normal Doppler measurements Normal Left ventricle Main pulmonary LVID ED, 49.5 mm 43-52 artery chord, Pressure, S 26 mm Hg =  30 PLAX Left ventricle LVID ES, 30.1 mm 23-38 Ea, lat 11. cm/s ------ chord, ann, tiss 6 PLAX DP FS, chord, 39 % >29 E/Ea, lat 3.2 ------ PLAX ann, tiss 8 LVPW, ED 11.4 mm ------ DP IVS/LVPW 0.94 <1.3 Ea, med 5.1 cm/s ------ ratio, ED ann, tiss 7 Ventricular septum DP IVS, ED 10.7 mm ------ E/Ea, med 7.3 ------ LVOT ann, tiss 5 Diam, S 23 mm ------ DP Area 4.15 cm^2 ------ LVOT Diam 23 mm ------ Peak vel, S 94 cm/s  ------ Aorta VTI, S 20. cm ------ Root diam, 34 mm ------ 5 ED Stroke vol 85. ml ------ Left atrium 2 AP dim 44 mm ------ Stroke 43 ml/m^2 ------ AP dim 2.22 cm/m^2 <2.2 index index Mitral valve Peak E vel 38 cm/s ------ Peak A vel 43. cm/s ------ 9 Deceleratio 310 ms 150-23 n time 0 Peak E/A 0.9 ------ ratio Max regurg 466 cm/s ------ vel Tricuspid valve Regurg peak 238 cm/s ------ vel Peak RV-RA 23 mm Hg ------ gradient, S Max regurg 238 cm/s ------ vel Systemic veins Estimated 3 mm Hg ------ CVP Right ventricle Pressure, S 26 mm Hg <30 Sa vel, lat 8.4 cm/s ------ ann, tiss 8 DP  ------------------------------------------------------------ Prepared and Electronically Authenticated by  Mertie Moores 2015-03-05T11:03:28.717       Impression:  He has a 44 mm fusiform ascending aortic aneurysm with a tricuspid aortic valve with mild regurgitation. It is unclear how long this has been at its current size. The descending aorta measures 30 mm which is on the larger end of the normal range. He has no family history of aneurysm disease. He does not require intervention for this at this time but it will require continued follow up with CTA or MRA. His BP is under good control and he is on a beta blocker. Since his aortic valve is tricuspid I would not recommend surgery unless he has a period of rapid enlargement ( 5 mm over a year) or if it reaches 5.5 cm. I reviewed the CTA and echo images with the patient and his wife and answered their questions.   Plan:  He will continue to follow up with [name] and Dr. Lysle Rubens. I will plan to see him back in 1 year with a CTA of the chest.

## 2013-04-29 DIAGNOSIS — I10 Essential (primary) hypertension: Secondary | ICD-10-CM | POA: Diagnosis not present

## 2013-04-29 DIAGNOSIS — Z0000 Encounter for general adult medical examination without abnormal findings: Secondary | ICD-10-CM | POA: Diagnosis not present

## 2013-04-29 DIAGNOSIS — G4700 Insomnia, unspecified: Secondary | ICD-10-CM | POA: Diagnosis not present

## 2013-04-29 DIAGNOSIS — M109 Gout, unspecified: Secondary | ICD-10-CM | POA: Diagnosis not present

## 2013-04-29 DIAGNOSIS — N183 Chronic kidney disease, stage 3 unspecified: Secondary | ICD-10-CM | POA: Diagnosis not present

## 2013-04-29 DIAGNOSIS — Z1331 Encounter for screening for depression: Secondary | ICD-10-CM | POA: Diagnosis not present

## 2013-04-29 DIAGNOSIS — I2510 Atherosclerotic heart disease of native coronary artery without angina pectoris: Secondary | ICD-10-CM | POA: Diagnosis not present

## 2013-04-29 DIAGNOSIS — Z23 Encounter for immunization: Secondary | ICD-10-CM | POA: Diagnosis not present

## 2013-04-29 DIAGNOSIS — I252 Old myocardial infarction: Secondary | ICD-10-CM | POA: Diagnosis not present

## 2013-04-29 DIAGNOSIS — E782 Mixed hyperlipidemia: Secondary | ICD-10-CM | POA: Diagnosis not present

## 2013-06-30 ENCOUNTER — Telehealth: Payer: Self-pay | Admitting: Cardiology

## 2013-06-30 NOTE — Telephone Encounter (Signed)
Called pt to get a little bit more info. Pt has soreness in his upper arm on left in muscle. Pt states the soreness is constant but feels it more when it moves. Pt has not taken any medication for this. Pt denies any CP or SOB. Pt sates his arms been sore about 2-3 days. Pt denies any heavy lifting and does not recall any way of hurting his arm to cause the soreness

## 2013-06-30 NOTE — Telephone Encounter (Signed)
New Message  Pt called states that he is sore in his left muscle in his left arm, Pt believes that it is heart related. Please call back.

## 2013-07-01 NOTE — Telephone Encounter (Signed)
Please have him see his PCP today

## 2013-07-01 NOTE — Telephone Encounter (Signed)
Pt made aware to see PCP today via VM

## 2013-07-17 DIAGNOSIS — I10 Essential (primary) hypertension: Secondary | ICD-10-CM | POA: Diagnosis not present

## 2013-07-17 DIAGNOSIS — N400 Enlarged prostate without lower urinary tract symptoms: Secondary | ICD-10-CM | POA: Diagnosis not present

## 2013-07-17 DIAGNOSIS — M109 Gout, unspecified: Secondary | ICD-10-CM | POA: Diagnosis not present

## 2013-07-17 DIAGNOSIS — N183 Chronic kidney disease, stage 3 unspecified: Secondary | ICD-10-CM | POA: Diagnosis not present

## 2013-07-17 DIAGNOSIS — K219 Gastro-esophageal reflux disease without esophagitis: Secondary | ICD-10-CM | POA: Diagnosis not present

## 2013-07-17 DIAGNOSIS — E782 Mixed hyperlipidemia: Secondary | ICD-10-CM | POA: Diagnosis not present

## 2013-07-17 DIAGNOSIS — M2550 Pain in unspecified joint: Secondary | ICD-10-CM | POA: Diagnosis not present

## 2013-07-22 ENCOUNTER — Ambulatory Visit: Payer: Medicare Other | Admitting: Physician Assistant

## 2013-08-17 DIAGNOSIS — N200 Calculus of kidney: Secondary | ICD-10-CM | POA: Diagnosis not present

## 2013-08-17 DIAGNOSIS — N281 Cyst of kidney, acquired: Secondary | ICD-10-CM | POA: Diagnosis not present

## 2013-09-10 DIAGNOSIS — R5383 Other fatigue: Secondary | ICD-10-CM | POA: Diagnosis not present

## 2013-09-10 DIAGNOSIS — M19049 Primary osteoarthritis, unspecified hand: Secondary | ICD-10-CM | POA: Diagnosis not present

## 2013-09-10 DIAGNOSIS — M2550 Pain in unspecified joint: Secondary | ICD-10-CM | POA: Diagnosis not present

## 2013-09-10 DIAGNOSIS — M109 Gout, unspecified: Secondary | ICD-10-CM | POA: Diagnosis not present

## 2013-09-10 DIAGNOSIS — R5381 Other malaise: Secondary | ICD-10-CM | POA: Diagnosis not present

## 2013-09-10 DIAGNOSIS — M25519 Pain in unspecified shoulder: Secondary | ICD-10-CM | POA: Diagnosis not present

## 2013-10-01 DIAGNOSIS — M2550 Pain in unspecified joint: Secondary | ICD-10-CM | POA: Diagnosis not present

## 2013-10-01 DIAGNOSIS — M159 Polyosteoarthritis, unspecified: Secondary | ICD-10-CM | POA: Diagnosis not present

## 2013-10-01 DIAGNOSIS — M1009 Idiopathic gout, multiple sites: Secondary | ICD-10-CM | POA: Diagnosis not present

## 2013-11-04 ENCOUNTER — Ambulatory Visit: Payer: Medicare Other | Admitting: Cardiology

## 2013-11-18 DIAGNOSIS — M1009 Idiopathic gout, multiple sites: Secondary | ICD-10-CM | POA: Diagnosis not present

## 2013-11-18 DIAGNOSIS — M2550 Pain in unspecified joint: Secondary | ICD-10-CM | POA: Diagnosis not present

## 2013-11-18 DIAGNOSIS — M159 Polyosteoarthritis, unspecified: Secondary | ICD-10-CM | POA: Diagnosis not present

## 2013-11-24 ENCOUNTER — Encounter: Payer: Self-pay | Admitting: Cardiology

## 2013-11-24 ENCOUNTER — Ambulatory Visit (INDEPENDENT_AMBULATORY_CARE_PROVIDER_SITE_OTHER): Payer: Medicare Other | Admitting: Cardiology

## 2013-11-24 ENCOUNTER — Other Ambulatory Visit: Payer: Self-pay

## 2013-11-24 VITALS — BP 102/70 | HR 70 | Ht 69.0 in | Wt 181.4 lb

## 2013-11-24 DIAGNOSIS — I10 Essential (primary) hypertension: Secondary | ICD-10-CM

## 2013-11-24 DIAGNOSIS — E782 Mixed hyperlipidemia: Secondary | ICD-10-CM

## 2013-11-24 DIAGNOSIS — I77819 Aortic ectasia, unspecified site: Secondary | ICD-10-CM | POA: Diagnosis not present

## 2013-11-24 DIAGNOSIS — I2583 Coronary atherosclerosis due to lipid rich plaque: Principal | ICD-10-CM

## 2013-11-24 DIAGNOSIS — I2510 Atherosclerotic heart disease of native coronary artery without angina pectoris: Secondary | ICD-10-CM | POA: Diagnosis not present

## 2013-11-24 DIAGNOSIS — Z01812 Encounter for preprocedural laboratory examination: Secondary | ICD-10-CM

## 2013-11-24 HISTORY — DX: Essential (primary) hypertension: I10

## 2013-11-24 NOTE — Patient Instructions (Signed)
Your physician has requested that you have a lexiscan myoview. For further information please visit [email or web address]. Please follow instruction sheet, as given.  [name] recommends you have a Chest CT Angio in MARCH 2016.  Your physician recommends that you have FASTING LAB WORK.   Your physician wants you to follow-up in: 1 year with [name]. You will receive a reminder letter in the mail two months in advance. If you don't receive a letter, please call our office to schedule the follow-up appointment.

## 2013-11-24 NOTE — Progress Notes (Signed)
[place], [place], [place]  [address] Phone: [phone] Fax:  [phone]  Date:  11/24/2013   ID:  Sean Willis, DOB 1945/01/10, MRN [id number]  PCP:  [name], MD  Cardiologist:  Fransico Him, MD    History of Present Illness: Sean Willis is a 69 y.o. male with a history of ASCAD, HTN and dyslipidemia. He is doing well. He denies any  SOB, DOE, LE edema, dizziness, palpitations or syncope. The only thing that is bothering him is gout. He plays golf a few times weekly.  He has had a few epidsodes of right sided chest pain lately that he describes as a tightness and resolves with baby ASA.     Wt Readings from Last 3 Encounters:  11/24/13 181 lb 6.4 oz (82.283 kg)  04/15/13 179 lb (81.194 kg)  03/27/13 179 lb (81.194 kg)     Past Medical History  Diagnosis Date  . Hypertension   . GERD (gastroesophageal reflux disease)   . ED (erectile dysfunction)   . Kidney stone     uric acid, Dr [name]  . Chronic insomnia   . Hypertriglyceridemia   . Osteoarthritis     of the knee  . DDD (degenerative disc disease), lumbar   . Colon polyp 10/11  . Gout   . Chest wall pain     Right side, muscular,ortho,MRI. U/S abd ok. On lyrica  . Acute MI 4/13    S/P CABG X4 Sx done in [place]  . Arrhythmia     Post op a-fib w episodes, several short burst of PAF at cardiac rehab-CHADS score is 1  . Carotid artery stenosis     Left 15-49 %  . Dilatation of aorta     Mild  . Coronary artery disease 04/2011    s/p CABG in setting of MI in [place], [place], with initial PTCA of OM2 then CABG with LIMA to LAD, SVG to D1, SVG to OM1,/R1 and SVG to distal RCA    Current Outpatient Prescriptions  Medication Sig Dispense Refill  . allopurinol (ZYLOPRIM) 300 MG tablet Take 300 mg by mouth daily.  1  . aspirin (ASPIRIN EC) 81 MG EC tablet Take 81 mg by mouth daily. Swallow whole.    . clopidogrel (PLAVIX) 75 MG tablet Take 75 mg by mouth daily.    [name] co-enzyme  Q-10 30 MG capsule Take 300 mg by mouth daily.    . colchicine 0.6 MG tablet Take 0.6 mg by mouth daily as needed.    . doxepin (SINEQUAN) 25 MG capsule Take 25 mg by mouth at bedtime.    [name] esomeprazole (NEXIUM) 40 MG capsule Take 40 mg by mouth daily as needed.    . fish oil-omega-3 fatty acids 1000 MG capsule Take 4 g by mouth daily.    [name] HYDROcodone-acetaminophen (NORCO) 7.5-325 MG per tablet Take 1 tablet by mouth every 6 (six) hours as needed for moderate pain.    . indomethacin (INDOCIN) 50 MG capsule Take 50 mg by mouth 2 (two) times daily as needed.    . loratadine (CLARITIN) 10 MG tablet Take 10 mg by mouth daily as needed.    . metoprolol tartrate (LOPRESSOR) 25 MG tablet Take 25 mg by mouth as directed. 1/2 TABLET TWICE DAILY    . pravastatin (PRAVACHOL) 80 MG tablet Take 80 mg by mouth every evening.    . vitamin B-12 (CYANOCOBALAMIN) 100 MCG tablet Take 100 mcg by mouth daily.    [name]  zolpidem (AMBIEN) 10 MG tablet Take 5 mg by mouth at bedtime as needed.     No current facility-administered medications for this visit.    Allergies:    Allergies  Allergen Reactions  . Celebrex [Celecoxib]     Upset stomach    Social History:  The patient  reports that he has never smoked. He does not have any smokeless tobacco history on file. He reports that he drinks alcohol. He reports that he does not use illicit drugs.   Family History:  The patient's family history includes Alzheimer's disease in his mother; Arrhythmia in his father and sister; CVA in his father; Cancer in his mother.   ROS:  Please see the history of present illness.      All other systems reviewed and negative.   PHYSICAL EXAM: VS:  BP 102/70 mmHg  Pulse 70  Ht 5\' 9"  (1.753 m)  Wt 181 lb 6.4 oz (82.283 kg)  BMI 26.78 kg/m2  SpO2 98% Well nourished, well developed, in no acute distress HEENT: normal Neck: no JVD Cardiac:  normal S1, S2; RRR; no murmur Lungs:  clear to auscultation bilaterally, no wheezing,  rhonchi or rales Abd: soft, nontender, no hepatomegaly Ext: no edema Skin: warm and dry Neuro:  CNs 2-12 intact, no focal abnormalities noted  ASSESSMENT AND PLAN:  1. ASCAD with a few episodes of atypical CP lately on the right side with tightness resolved with ASA. He never had chest pain prior to his CABG but did have bilateral arm pain which he denies at this time.  - continue ASA/Plavix  - Lexiscan myoview to assess for ischemia. 2. HTN - well controlled - continue metprolol 3. Dyslipidemia - lipids at goal - continue pravastatin  - check FLP and ALT   4.   Mild ascending aortic aneurysm 4.4cm by chest CT - Repeat chest CT in March 2016  Followup with me in 6 months     Signed, Fransico Him, MD [facility] 11/24/2013 10:17 AM

## 2013-11-25 ENCOUNTER — Other Ambulatory Visit (INDEPENDENT_AMBULATORY_CARE_PROVIDER_SITE_OTHER): Payer: Medicare Other | Admitting: *Deleted

## 2013-11-25 DIAGNOSIS — E782 Mixed hyperlipidemia: Secondary | ICD-10-CM

## 2013-11-25 LAB — LIPID PANEL
CHOLESTEROL: 107 mg/dL (ref 0–200)
HDL: 33.8 mg/dL — ABNORMAL LOW (ref >39.00)
LDL Cholesterol: 57 mg/dL (ref 0–99)
NonHDL: 73.2
Total CHOL/HDL Ratio: 3
Triglycerides: 80 mg/dL (ref 0.0–149.0)
VLDL: 16 mg/dL (ref 0.0–40.0)

## 2013-11-25 LAB — ALT: ALT: 37 U/L (ref 0–53)

## 2013-11-30 ENCOUNTER — Ambulatory Visit (HOSPITAL_COMMUNITY): Payer: Medicare Other | Attending: Cardiology | Admitting: Radiology

## 2013-11-30 DIAGNOSIS — I4891 Unspecified atrial fibrillation: Secondary | ICD-10-CM | POA: Insufficient documentation

## 2013-11-30 DIAGNOSIS — I2510 Atherosclerotic heart disease of native coronary artery without angina pectoris: Secondary | ICD-10-CM | POA: Diagnosis not present

## 2013-11-30 DIAGNOSIS — I10 Essential (primary) hypertension: Secondary | ICD-10-CM | POA: Diagnosis not present

## 2013-11-30 DIAGNOSIS — I2583 Coronary atherosclerosis due to lipid rich plaque: Secondary | ICD-10-CM

## 2013-11-30 DIAGNOSIS — R0789 Other chest pain: Secondary | ICD-10-CM | POA: Insufficient documentation

## 2013-11-30 MED ORDER — TECHNETIUM TC 99M SESTAMIBI GENERIC - CARDIOLITE
30.0000 | Freq: Once | INTRAVENOUS | Status: AC | PRN
  Administered 2013-11-30: 30 via INTRAVENOUS

## 2013-11-30 MED ORDER — REGADENOSON 0.4 MG/5ML IV SOLN
0.4000 mg | Freq: Once | INTRAVENOUS | Status: AC
  Administered 2013-11-30: 0.4 mg via INTRAVENOUS

## 2013-11-30 MED ORDER — TECHNETIUM TC 99M SESTAMIBI GENERIC - CARDIOLITE
10.0000 | Freq: Once | INTRAVENOUS | Status: AC | PRN
  Administered 2013-11-30: 10 via INTRAVENOUS

## 2013-11-30 NOTE — Progress Notes (Signed)
[facility] [address] [place], [place] [address] [phone]    Cardiology Nuclear Med Study  Sean Willis is a 68 y.o. male     MRN : [id number]     DOB: May 09, 1945  Procedure Date: 11/30/2013  Nuclear Med Background Indication for Stress Test:  Evaluation for Ischemia and Follow up CAD History:  CAD, MPI 2014 (normal) EF 54% Cardiac Risk Factors: Carotid Disease, Hypertension and Atrial Fib  Symptoms:  Chest Tightness    Nuclear Pre-Procedure Caffeine/Decaff Intake:  7:00pm NPO After: 10:00pm   Lungs:  clear O2 Sat: 97% on room air. IV 0.9% NS with Angio Cath:  22g  IV Site: R Hand  IV Started by:  Matilde Haymaker, RN  Chest Size (in):  43 Cup Size: n/a  Height: 5\' 9"  (1.753 m)  Weight:  178 lb (80.74 kg)  BMI:  Body mass index is 26.27 kg/(m^2). Tech Comments:  No Metoprolol x 12 hrs    Nuclear Med Study 1 or 2 day study: 1 day  Stress Test Type:  Lexiscan  [place] MD: n/a  Order Authorizing Provider:  Tressia Miners Turner,MD  Resting Radionuclide: Technetium 1m Sestamibi  Resting Radionuclide Dose: 11.0 mCi   Stress Radionuclide:  Technetium 47m Sestamibi  Stress Radionuclide Dose: 33.0 mCi           Stress Protocol Rest HR: 64 Stress HR: 97  Rest BP: 136/98 Stress BP: 107/75  Exercise Time (min): n/a METS: n/a   Predicted Max HR: 152 bpm % Max HR: 63.82 bpm Rate Pressure Product: 12804   Dose of Adenosine (mg):  n/a Dose of Lexiscan: 0.4 mg  Dose of Atropine (mg): n/a Dose of Dobutamine: n/a mcg/kg/min (at max HR)  Stress Test Technologist: Glade Lloyd, BS-ES  Nuclear Technologist:  Earl Many, CNMT     Rest Procedure:  Myocardial perfusion imaging was performed at rest 45 minutes following the intravenous administration of Technetium 12m Sestamibi. Rest ECG: NSR - Normal EKG  Stress Procedure:  The patient received IV Lexiscan 0.4 mg over 15-seconds.  Technetium 3m Sestamibi injected at 30-seconds.  Quantitative  spect images were obtained after a 45 minute delay.  During the infusion of Lexiscan the patient complained of SOB that resolved in recovery.  Stress ECG: No significant change from baseline ECG  QPS Raw Data Images:  Normal; no motion artifact; normal heart/lung ratio. Stress Images:  There is a medium sized area of mild - moderate attenuation in the mid-basal lateral wall.  Rest Images:  There is a medium sized area of mild - moderate attenuation in the mid-basal lateral wall. Subtraction (SDS):  No evidence of ischemia. There is evidence of a previous mid-basal lateral wall MI.  Transient Ischemic Dilatation (Normal <1.22):  0.97 Lung/Heart Ratio (Normal <0.45):  0.32  Quantitative Gated Spect Images QGS EDV:  111 ml QGS ESV:  64 ml  Impression Exercise Capacity:  Lexiscan with no exercise. BP Response:  Normal blood pressure response. Clinical Symptoms:  Mild chest pain/dyspnea. ECG Impression:  No significant ST segment change suggestive of ischemia. Comparison with Prior Nuclear Study: No images to compare  Overall Impression:  Intermediate risk stress nuclear study .   There is evidence of a previous lateral MI.  LV Ejection Fraction: 42%.  LV Wall Motion:  moderate - severe hypokinesis of the lateral wall.     Thayer Headings, Brooke Bonito., MD, [facility] 11/30/2013, 2:52 PM [address]  [address] Office -  [phone] Pager [phone]

## 2013-12-01 ENCOUNTER — Telehealth: Payer: Self-pay

## 2013-12-01 NOTE — Telephone Encounter (Signed)
Informed patient of stress test and lab results.  Informed patient that his EF was reduced, and that [name] recommends an ECHO to get another assessment of EF. Instructed patient [facility] will call and schedule ECHO. Patient agrees with treatment plan.

## 2013-12-01 NOTE — Telephone Encounter (Signed)
-----   Message from Sueanne Margarita, MD sent at 12/01/2013  8:41 AM EST ----- EF was reduced at 42% please get an echo to get another assessment of EF

## 2013-12-14 ENCOUNTER — Ambulatory Visit (HOSPITAL_COMMUNITY): Payer: Medicare Other | Attending: Cardiology | Admitting: Cardiology

## 2013-12-14 DIAGNOSIS — I10 Essential (primary) hypertension: Secondary | ICD-10-CM | POA: Diagnosis not present

## 2013-12-14 DIAGNOSIS — I340 Nonrheumatic mitral (valve) insufficiency: Secondary | ICD-10-CM | POA: Diagnosis not present

## 2013-12-14 DIAGNOSIS — I77819 Aortic ectasia, unspecified site: Secondary | ICD-10-CM

## 2013-12-14 DIAGNOSIS — E785 Hyperlipidemia, unspecified: Secondary | ICD-10-CM | POA: Insufficient documentation

## 2013-12-14 DIAGNOSIS — I351 Nonrheumatic aortic (valve) insufficiency: Secondary | ICD-10-CM | POA: Insufficient documentation

## 2013-12-14 NOTE — Progress Notes (Signed)
Echo performed. 

## 2013-12-17 ENCOUNTER — Telehealth: Payer: Self-pay | Admitting: Cardiology

## 2013-12-17 NOTE — Telephone Encounter (Signed)
Pt is aware of echo results and MD's recommendations.

## 2013-12-17 NOTE — Telephone Encounter (Signed)
New message ° ° ° ° ° °Want echo results °

## 2013-12-23 ENCOUNTER — Other Ambulatory Visit: Payer: Self-pay

## 2013-12-23 ENCOUNTER — Telehealth: Payer: Self-pay | Admitting: Cardiology

## 2013-12-23 DIAGNOSIS — I77819 Aortic ectasia, unspecified site: Secondary | ICD-10-CM

## 2013-12-23 NOTE — Telephone Encounter (Signed)
Spoke with patient about results and informed him that Echo needed to be repeated in one year. Pt agrees with treatment plan.

## 2013-12-23 NOTE — Telephone Encounter (Signed)
New Message  Pt returning [facility] Phone call.

## 2013-12-23 NOTE — Telephone Encounter (Signed)
-----   Message from Sueanne Margarita, MD sent at 12/15/2013  8:07 AM EST ----- Stable dilated aortic root compared to Chest CT 03/2013 - repeat in 1 year

## 2014-01-07 DIAGNOSIS — M2550 Pain in unspecified joint: Secondary | ICD-10-CM | POA: Diagnosis not present

## 2014-01-07 DIAGNOSIS — M159 Polyosteoarthritis, unspecified: Secondary | ICD-10-CM | POA: Diagnosis not present

## 2014-01-07 DIAGNOSIS — Z79899 Other long term (current) drug therapy: Secondary | ICD-10-CM | POA: Diagnosis not present

## 2014-01-07 DIAGNOSIS — M1009 Idiopathic gout, multiple sites: Secondary | ICD-10-CM | POA: Diagnosis not present

## 2014-01-22 ENCOUNTER — Other Ambulatory Visit: Payer: Medicare Other

## 2014-03-03 ENCOUNTER — Other Ambulatory Visit (INDEPENDENT_AMBULATORY_CARE_PROVIDER_SITE_OTHER): Payer: Medicare Other | Admitting: *Deleted

## 2014-03-03 DIAGNOSIS — Z01812 Encounter for preprocedural laboratory examination: Secondary | ICD-10-CM | POA: Diagnosis not present

## 2014-03-03 LAB — BASIC METABOLIC PANEL
BUN: 19 mg/dL (ref 6–23)
CO2: 29 mEq/L (ref 19–32)
CREATININE: 1.23 mg/dL (ref 0.40–1.50)
Calcium: 9.3 mg/dL (ref 8.4–10.5)
Chloride: 107 mEq/L (ref 96–112)
GFR: 62.07 mL/min (ref >60.00)
GLUCOSE: 93 mg/dL (ref 70–99)
Potassium: 4.2 mEq/L (ref 3.5–5.1)
SODIUM: 138 meq/L (ref 135–145)

## 2014-03-03 NOTE — Addendum Note (Signed)
Addended by: Eulis Foster on: 03/03/2014 07:42 AM   Modules accepted: Orders

## 2014-03-08 ENCOUNTER — Inpatient Hospital Stay: Admission: RE | Admit: 2014-03-08 | Payer: Medicare Other | Source: Ambulatory Visit

## 2014-03-12 ENCOUNTER — Ambulatory Visit (INDEPENDENT_AMBULATORY_CARE_PROVIDER_SITE_OTHER)
Admission: RE | Admit: 2014-03-12 | Discharge: 2014-03-12 | Disposition: A | Discharge status: Home / Self Care | Payer: Medicare Other | Source: Ambulatory Visit | Attending: Cardiology | Admitting: Cardiology

## 2014-03-12 DIAGNOSIS — I517 Cardiomegaly: Secondary | ICD-10-CM | POA: Diagnosis not present

## 2014-03-12 DIAGNOSIS — I712 Thoracic aortic aneurysm, without rupture: Secondary | ICD-10-CM | POA: Diagnosis not present

## 2014-03-12 DIAGNOSIS — I77819 Aortic ectasia, unspecified site: Secondary | ICD-10-CM | POA: Diagnosis not present

## 2014-03-12 IMAGING — CT CT ANGIO CHEST
2 of 6 series · 13 of 36 positions shown · IV contrast (Omnipaque 300)
Comparison: [DATE]

CLINICAL DATA: Followup of thoracic aortic aneurysmal disease.

EXAM:
CT ANGIOGRAPHY CHEST WITH CONTRAST
TECHNIQUE: Multidetector CT imaging of the chest was performed using the
standard protocol during bolus administration of intravenous
contrast. Multiplanar CT image reconstructions and MIPs were
obtained to evaluate the vascular anatomy.
CONTRAST:  100mL OMNIPAQUE IOHEXOL 350 MG/ML SOLN

[Series 4: cta chest w/cm 3mm · axial · 0.68mm/px · z∈[-266,-34]mm · 12 of 92 slices shown]
[im 8/92  lung]
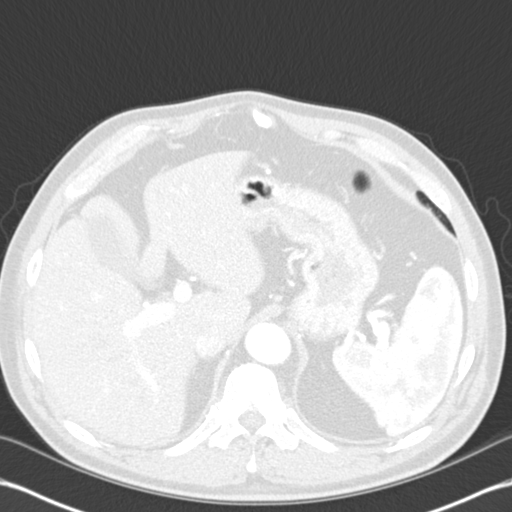
[im 15/92  mediastinal]
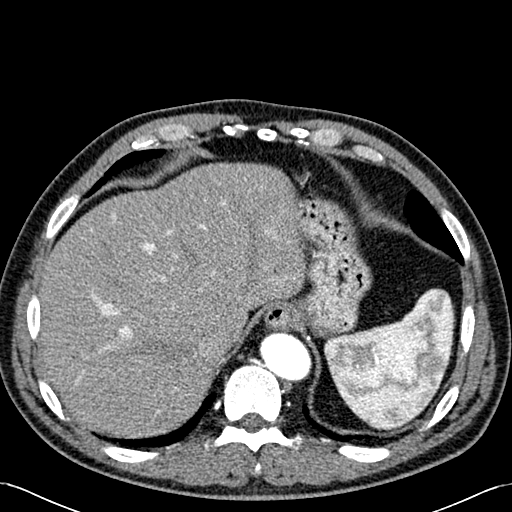
[im 22/92  lung]
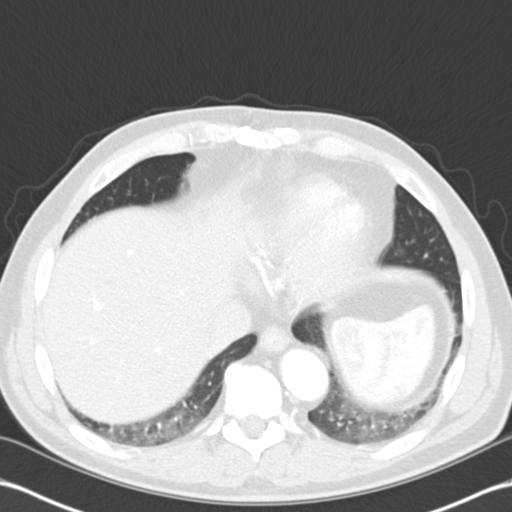
[im 29/92  mediastinal]
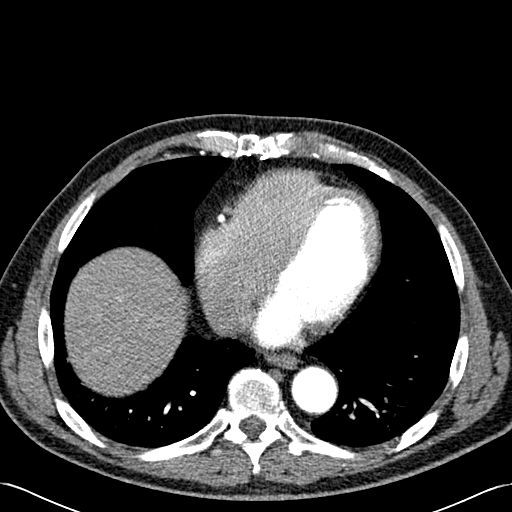
[im 36/92  lung]
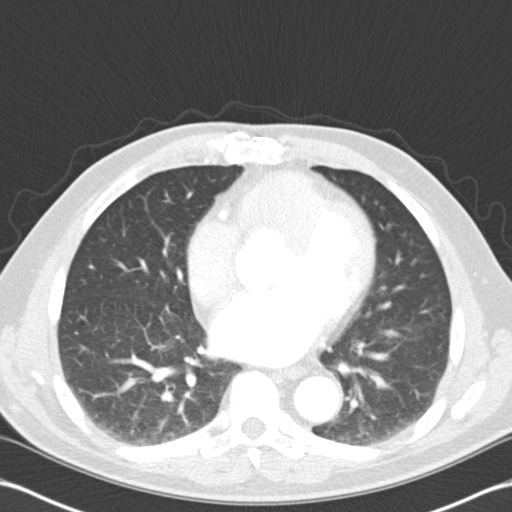
[im 43/92  mediastinal]
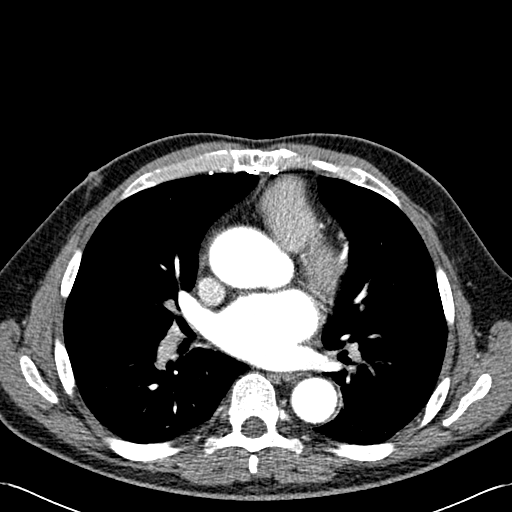
[im 50/92  lung]
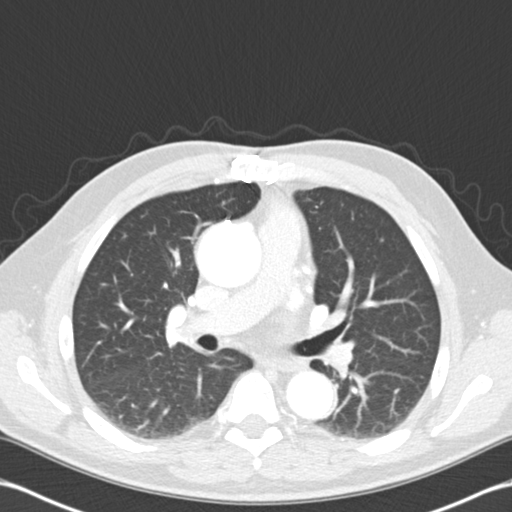
[im 57/92  mediastinal]
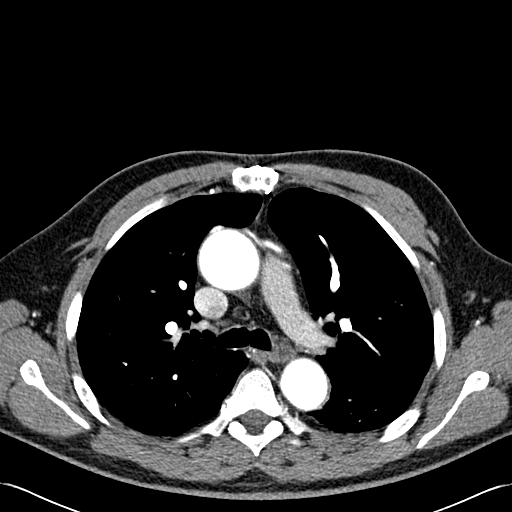
[im 64/92  lung]
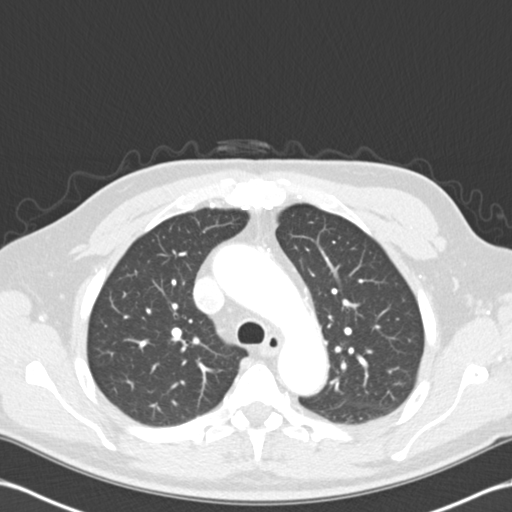
[im 71/92  mediastinal]
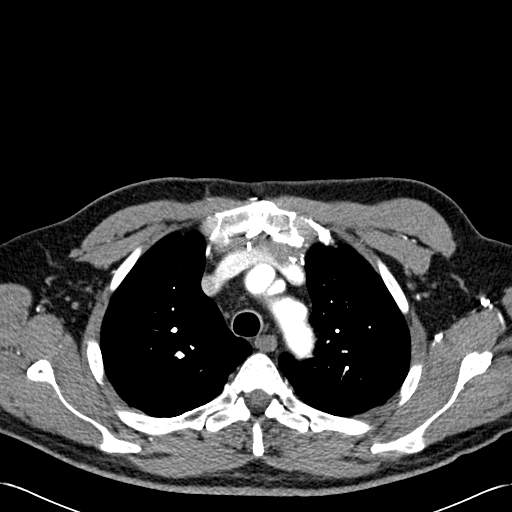
[im 78/92  lung]
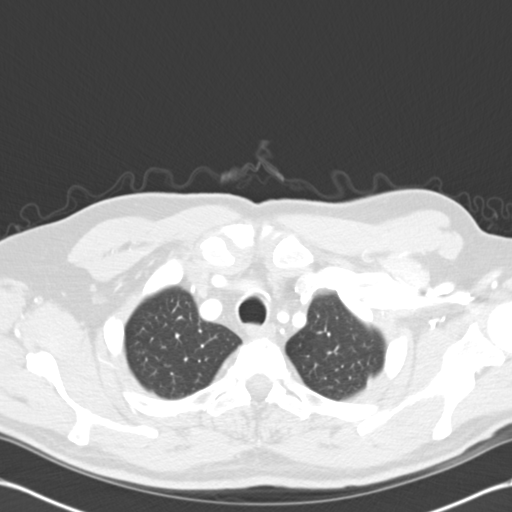
[im 85/92  mediastinal]
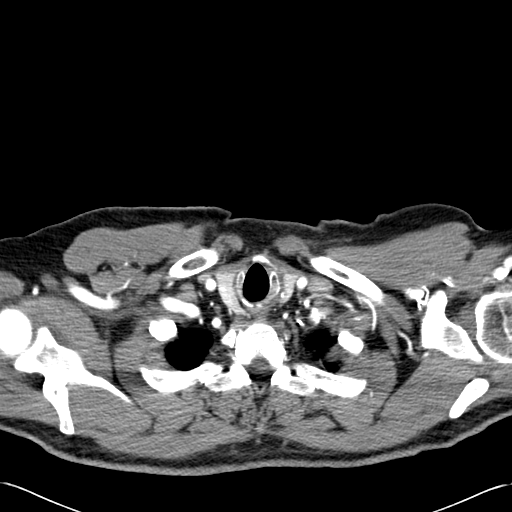

[Series 602: cor mpr · coronal · 0.68mm/px · 1 of 110 slices shown]
[im 55/110  mediastinal]
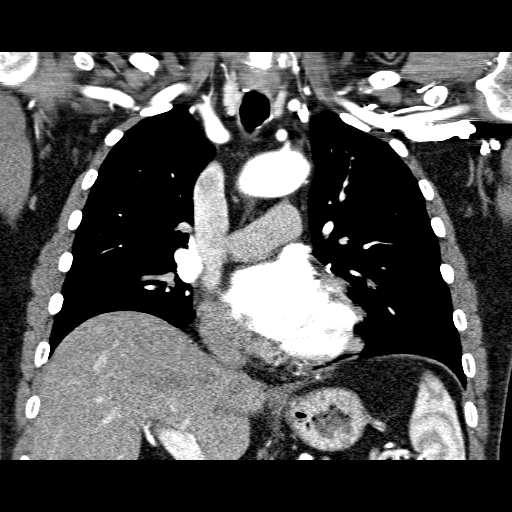

[13 of 36 positions shown; findings below may reference images not displayed]

FINDINGS: There is stable aneurysmal disease of the aortic root and proximal
ascending thoracic aorta. The aorta measures approximately 4.0-
cm at the level of the sinuses of Valsalva with mildly asymmetric
dilatation of the non coronary cusp. The proximal ascending thoracic
aortic continues to measure approximately 4.4 cm in greatest
diameter. The proximal arch measures 3.5 cm, the distal arch
measures 3.3 cm and the descending thoracic aorta measures 3.1 cm.
There is no evidence of aortic dissection, penetrating
atherosclerotic ulcer or significant aortic plaque.

Proximal great vessels show normal patency with normal variant
anatomy noted of a separate origin of the left vertebral artery off
of the aortic arch. There is evidence of prior coronary artery
bypass grafting. Stable mildly prominent heart size with prominence
of the left atrium. Left atrial AP diameter is approximately 5.4 cm.
The left atrial appendage is normally patent.

Lungs show no evidence of infiltrates, edema or nodules. No pleural
or pericardial fluid identified. No lymphadenopathy is seen in the
chest. No airway obstruction. Visualized upper abdominal structures
are unremarkable. No bony abnormalities are identified.

Review of the MIP images confirms the above findings.
IMPRESSION: 1. Stable aneurysmal disease of the proximal thoracic aorta with
maximal diameter of 4.4 cm at the level of the ascending thoracic
aorta. The aorta at the sinuses of Valsalva is top-normal in
diameter (4.0- 4.1 cm) with mild asymmetric dilatation of the non
coronary cusp.
2. Stable cardiac enlargement, especially involving the left atrium.
3. No acute findings in the chest.

## 2014-03-12 MED ORDER — IOHEXOL 350 MG/ML SOLN
100.0000 mL | Freq: Once | INTRAVENOUS | Status: AC | PRN
  Administered 2014-03-12: 100 mL via INTRAVENOUS

## 2014-03-17 ENCOUNTER — Other Ambulatory Visit: Payer: Self-pay | Admitting: *Deleted

## 2014-03-17 DIAGNOSIS — I712 Thoracic aortic aneurysm, without rupture, unspecified: Secondary | ICD-10-CM

## 2014-04-21 ENCOUNTER — Encounter: Payer: Federal, State, Local not specified - PPO | Admitting: Surgery

## 2014-04-21 ENCOUNTER — Inpatient Hospital Stay: Admission: RE | Admit: 2014-04-21 | Payer: Federal, State, Local not specified - PPO | Source: Ambulatory Visit

## 2014-05-04 DIAGNOSIS — M519 Unspecified thoracic, thoracolumbar and lumbosacral intervertebral disc disorder: Secondary | ICD-10-CM | POA: Diagnosis not present

## 2014-05-04 DIAGNOSIS — Z0000 Encounter for general adult medical examination without abnormal findings: Secondary | ICD-10-CM | POA: Diagnosis not present

## 2014-05-04 DIAGNOSIS — Z1389 Encounter for screening for other disorder: Secondary | ICD-10-CM | POA: Diagnosis not present

## 2014-05-04 DIAGNOSIS — I252 Old myocardial infarction: Secondary | ICD-10-CM | POA: Diagnosis not present

## 2014-05-04 DIAGNOSIS — I25810 Atherosclerosis of coronary artery bypass graft(s) without angina pectoris: Secondary | ICD-10-CM | POA: Diagnosis not present

## 2014-05-04 DIAGNOSIS — E782 Mixed hyperlipidemia: Secondary | ICD-10-CM | POA: Diagnosis not present

## 2014-05-04 DIAGNOSIS — N183 Chronic kidney disease, stage 3 (moderate): Secondary | ICD-10-CM | POA: Diagnosis not present

## 2014-05-04 DIAGNOSIS — M109 Gout, unspecified: Secondary | ICD-10-CM | POA: Diagnosis not present

## 2014-05-04 DIAGNOSIS — N400 Enlarged prostate without lower urinary tract symptoms: Secondary | ICD-10-CM | POA: Diagnosis not present

## 2014-05-04 DIAGNOSIS — I712 Thoracic aortic aneurysm, without rupture: Secondary | ICD-10-CM | POA: Diagnosis not present

## 2014-05-04 DIAGNOSIS — I10 Essential (primary) hypertension: Secondary | ICD-10-CM | POA: Diagnosis not present

## 2014-05-04 DIAGNOSIS — G4700 Insomnia, unspecified: Secondary | ICD-10-CM | POA: Diagnosis not present

## 2014-05-05 ENCOUNTER — Ambulatory Visit (INDEPENDENT_AMBULATORY_CARE_PROVIDER_SITE_OTHER): Payer: Medicare Other | Admitting: Surgery

## 2014-05-05 ENCOUNTER — Encounter: Payer: Self-pay | Admitting: Surgery

## 2014-05-05 VITALS — BP 108/77 | HR 66 | Resp 16 | Ht 69.0 in | Wt 184.0 lb

## 2014-05-05 DIAGNOSIS — I712 Thoracic aortic aneurysm, without rupture, unspecified: Secondary | ICD-10-CM

## 2014-05-05 NOTE — Progress Notes (Signed)
HPI:  The patient returns for follow up of a 4.4 cm ascending aortic aneurysm found incidentally on echo and CT scan. I saw him for the first time last year on 04/15/2013. He has done well over the past year except for persistent problems with gout which he has had since his heart surgery in [place] in 04/2011. He is on Zyloprim, colchicine and takes Indocin a few days per week. He is followed by a rheumatologist.  Current Outpatient Prescriptions  Medication Sig Dispense Refill  . allopurinol (ZYLOPRIM) 300 MG tablet Take 300 mg by mouth daily.  1  . aspirin (ASPIRIN EC) 81 MG EC tablet Take 81 mg by mouth daily. Swallow whole.    . clopidogrel (PLAVIX) 75 MG tablet Take 75 mg by mouth daily.    [name] co-enzyme Q-10 30 MG capsule Take 300 mg by mouth daily.    . colchicine 0.6 MG tablet Take 0.6 mg by mouth daily as needed.    . doxepin (SINEQUAN) 25 MG capsule Take 25 mg by mouth at bedtime.    [name] esomeprazole (NEXIUM) 40 MG capsule Take 40 mg by mouth daily as needed.    . fish oil-omega-3 fatty acids 1000 MG capsule Take 4 g by mouth daily.    [name] HYDROcodone-acetaminophen (NORCO) 7.5-325 MG per tablet Take 1 tablet by mouth every 6 (six) hours as needed for moderate pain.    . indomethacin (INDOCIN) 50 MG capsule Take 50 mg by mouth 2 (two) times daily as needed.    . loratadine (CLARITIN) 10 MG tablet Take 10 mg by mouth daily as needed.    . metoprolol tartrate (LOPRESSOR) 25 MG tablet Take 25 mg by mouth as directed. 1/2 TABLET TWICE DAILY    . pravastatin (PRAVACHOL) 80 MG tablet Take 80 mg by mouth every evening.    . vitamin B-12 (CYANOCOBALAMIN) 100 MCG tablet Take 100 mcg by mouth daily.    [name] zolpidem (AMBIEN) 10 MG tablet Take 5 mg by mouth at bedtime as needed.     No current facility-administered medications for this visit.     Physical Exam: BP 108/77 mmHg  Pulse 66  Resp 16  Ht 5\' 9"  (1.753 m)  Wt 184 lb (83.462 kg)  BMI 27.16 kg/m2  SpO2 97% He looks  well Cardiac exam shows a regular rate and rhythm with normal heart sounds. Lungs are clear  Diagnostic Tests:   CLINICAL DATA: Followup of thoracic aortic aneurysmal disease.  EXAM: CT ANGIOGRAPHY CHEST WITH CONTRAST  TECHNIQUE: Multidetector CT imaging of the chest was performed using the standard protocol during bolus administration of intravenous contrast. Multiplanar CT image reconstructions and MIPs were obtained to evaluate the vascular anatomy.  CONTRAST: 149mL OMNIPAQUE IOHEXOL 350 MG/ML SOLN  COMPARISON: 03/27/2013  FINDINGS: There is stable aneurysmal disease of the aortic root and proximal ascending thoracic aorta. The aorta measures approximately 4.0- 4.1 cm at the level of the sinuses of Valsalva with mildly asymmetric dilatation of the non coronary cusp. The proximal ascending thoracic aortic continues to measure approximately 4.4 cm in greatest diameter. The proximal arch measures 3.5 cm, the distal arch measures 3.3 cm and the descending thoracic aorta measures 3.1 cm. There is no evidence of aortic dissection, penetrating atherosclerotic ulcer or significant aortic plaque.  Proximal great vessels show normal patency with normal variant anatomy noted of a separate origin of the left vertebral artery off of the aortic arch. There is evidence of prior coronary  artery bypass grafting. Stable mildly prominent heart size with prominence of the left atrium. Left atrial AP diameter is approximately 5.4 cm. The left atrial appendage is normally patent.  Lungs show no evidence of infiltrates, edema or nodules. No pleural or pericardial fluid identified. No lymphadenopathy is seen in the chest. No airway obstruction. Visualized upper abdominal structures are unremarkable. No bony abnormalities are identified.  Review of the MIP images confirms the above findings.  IMPRESSION: 1. Stable aneurysmal disease of the proximal thoracic aorta with maximal  diameter of 4.4 cm at the level of the ascending thoracic aorta. The aorta at the sinuses of Valsalva is top-normal in diameter (4.0- 4.1 cm) with mild asymmetric dilatation of the non coronary cusp. 2. Stable cardiac enlargement, especially involving the left atrium. 3. No acute findings in the chest.   Electronically Signed  By: Aletta Edouard M.D.  On: 03/12/2014 13:46  Impression:  He has a stable 4.4 cm fusiform ascending aortic aneurysm. His BP is well-controlled and he is on a beta blocker. There is no indication for surgical treatment at this time.  Plan:  I will see him back in one year for follow up with a repeat CTA of the chest.   Gaye Pollack, MD [facility] [phone]

## 2014-05-14 DIAGNOSIS — Z79899 Other long term (current) drug therapy: Secondary | ICD-10-CM | POA: Diagnosis not present

## 2014-05-14 DIAGNOSIS — M159 Polyosteoarthritis, unspecified: Secondary | ICD-10-CM | POA: Diagnosis not present

## 2014-05-14 DIAGNOSIS — M2550 Pain in unspecified joint: Secondary | ICD-10-CM | POA: Diagnosis not present

## 2014-05-14 DIAGNOSIS — M1009 Idiopathic gout, multiple sites: Secondary | ICD-10-CM | POA: Diagnosis not present

## 2014-07-07 DIAGNOSIS — M1009 Idiopathic gout, multiple sites: Secondary | ICD-10-CM | POA: Diagnosis not present

## 2014-07-07 DIAGNOSIS — Z79899 Other long term (current) drug therapy: Secondary | ICD-10-CM | POA: Diagnosis not present

## 2014-07-07 DIAGNOSIS — M159 Polyosteoarthritis, unspecified: Secondary | ICD-10-CM | POA: Diagnosis not present

## 2014-07-07 DIAGNOSIS — M2550 Pain in unspecified joint: Secondary | ICD-10-CM | POA: Diagnosis not present

## 2014-08-13 ENCOUNTER — Encounter: Payer: Self-pay | Admitting: Cardiology

## 2014-10-06 DIAGNOSIS — H2511 Age-related nuclear cataract, right eye: Secondary | ICD-10-CM | POA: Diagnosis not present

## 2014-10-11 DIAGNOSIS — H2511 Age-related nuclear cataract, right eye: Secondary | ICD-10-CM | POA: Diagnosis not present

## 2014-10-12 DIAGNOSIS — R351 Nocturia: Secondary | ICD-10-CM | POA: Diagnosis not present

## 2014-10-12 DIAGNOSIS — N281 Cyst of kidney, acquired: Secondary | ICD-10-CM | POA: Diagnosis not present

## 2014-10-12 DIAGNOSIS — N138 Other obstructive and reflux uropathy: Secondary | ICD-10-CM | POA: Diagnosis not present

## 2014-10-20 ENCOUNTER — Encounter: Payer: Self-pay | Admitting: *Deleted

## 2014-10-26 ENCOUNTER — Encounter
Admission: RE | Disposition: A | Discharge status: Home / Self Care | Payer: Self-pay | Source: Ambulatory Visit | Attending: Ophthalmology

## 2014-10-26 ENCOUNTER — Ambulatory Visit: Payer: Medicare Other | Admitting: Certified Registered Nurse Anesthetist

## 2014-10-26 ENCOUNTER — Encounter: Payer: Self-pay | Admitting: Anesthesiology

## 2014-10-26 ENCOUNTER — Ambulatory Visit
Admission: RE | Admit: 2014-10-26 | Discharge: 2014-10-26 | Disposition: A | Discharge status: Home / Self Care | Payer: Medicare Other | Source: Ambulatory Visit | Attending: Ophthalmology | Admitting: Ophthalmology

## 2014-10-26 DIAGNOSIS — I4891 Unspecified atrial fibrillation: Secondary | ICD-10-CM | POA: Diagnosis not present

## 2014-10-26 DIAGNOSIS — Z87442 Personal history of urinary calculi: Secondary | ICD-10-CM | POA: Insufficient documentation

## 2014-10-26 DIAGNOSIS — Z7902 Long term (current) use of antithrombotics/antiplatelets: Secondary | ICD-10-CM | POA: Insufficient documentation

## 2014-10-26 DIAGNOSIS — M109 Gout, unspecified: Secondary | ICD-10-CM | POA: Insufficient documentation

## 2014-10-26 DIAGNOSIS — I252 Old myocardial infarction: Secondary | ICD-10-CM | POA: Diagnosis not present

## 2014-10-26 DIAGNOSIS — N400 Enlarged prostate without lower urinary tract symptoms: Secondary | ICD-10-CM | POA: Insufficient documentation

## 2014-10-26 DIAGNOSIS — H2511 Age-related nuclear cataract, right eye: Secondary | ICD-10-CM | POA: Diagnosis not present

## 2014-10-26 DIAGNOSIS — E7800 Pure hypercholesterolemia, unspecified: Secondary | ICD-10-CM | POA: Diagnosis not present

## 2014-10-26 DIAGNOSIS — Z7982 Long term (current) use of aspirin: Secondary | ICD-10-CM | POA: Insufficient documentation

## 2014-10-26 DIAGNOSIS — Z951 Presence of aortocoronary bypass graft: Secondary | ICD-10-CM | POA: Diagnosis not present

## 2014-10-26 DIAGNOSIS — H269 Unspecified cataract: Secondary | ICD-10-CM | POA: Diagnosis present

## 2014-10-26 DIAGNOSIS — I25810 Atherosclerosis of coronary artery bypass graft(s) without angina pectoris: Secondary | ICD-10-CM | POA: Diagnosis not present

## 2014-10-26 DIAGNOSIS — I10 Essential (primary) hypertension: Secondary | ICD-10-CM | POA: Diagnosis not present

## 2014-10-26 DIAGNOSIS — Z79891 Long term (current) use of opiate analgesic: Secondary | ICD-10-CM | POA: Insufficient documentation

## 2014-10-26 DIAGNOSIS — Z79899 Other long term (current) drug therapy: Secondary | ICD-10-CM | POA: Diagnosis not present

## 2014-10-26 HISTORY — PX: CATARACT EXTRACTION W/PHACO: SHX586

## 2014-10-26 HISTORY — DX: Benign prostatic hyperplasia without lower urinary tract symptoms: N40.0

## 2014-10-26 SURGERY — PHACOEMULSIFICATION, CATARACT, WITH IOL INSERTION
Anesthesia: Monitor Anesthesia Care | Laterality: Right | Wound class: Clean

## 2014-10-26 MED ORDER — CARBACHOL 0.01 % IO SOLN
INTRAOCULAR | Status: DC | PRN
  Administered 2014-10-26: 0.5 mL via INTRAOCULAR

## 2014-10-26 MED ORDER — ARMC OPHTHALMIC DILATING GEL
OPHTHALMIC | Status: AC
  Administered 2014-10-26: 1 via OPHTHALMIC
  Filled 2014-10-26: qty 0.25

## 2014-10-26 MED ORDER — POVIDONE-IODINE 5 % OP SOLN
OPHTHALMIC | Status: AC
  Administered 2014-10-26: 1 via OPHTHALMIC
  Filled 2014-10-26: qty 30

## 2014-10-26 MED ORDER — TRYPAN BLUE 0.06 % OP SOLN
OPHTHALMIC | Status: AC
  Filled 2014-10-26: qty 0.5

## 2014-10-26 MED ORDER — ARMC OPHTHALMIC DILATING GEL
1.0000 "application " | OPHTHALMIC | Status: DC | PRN
  Administered 2014-10-26 (×2): 1 via OPHTHALMIC

## 2014-10-26 MED ORDER — FENTANYL CITRATE (PF) 100 MCG/2ML IJ SOLN
INTRAMUSCULAR | Status: DC | PRN
  Administered 2014-10-26: 50 ug via INTRAVENOUS

## 2014-10-26 MED ORDER — NA CHONDROIT SULF-NA HYALURON 40-17 MG/ML IO SOLN
INTRAOCULAR | Status: AC
  Filled 2014-10-26: qty 1

## 2014-10-26 MED ORDER — MOXIFLOXACIN HCL 0.5 % OP SOLN: 1.0000 [drp] | OPHTHALMIC | Status: DC | PRN

## 2014-10-26 MED ORDER — MIDAZOLAM HCL 2 MG/2ML IJ SOLN: INTRAMUSCULAR | Status: DC | PRN

## 2014-10-26 MED ORDER — TETRACAINE HCL 0.5 % OP SOLN
OPHTHALMIC | Status: AC
  Administered 2014-10-26: 1 [drp] via OPHTHALMIC
  Filled 2014-10-26: qty 2

## 2014-10-26 MED ORDER — EPINEPHRINE HCL 1 MG/ML IJ SOLN
INTRAOCULAR | Status: DC | PRN
  Administered 2014-10-26: 200 mL via OPHTHALMIC

## 2014-10-26 MED ORDER — MIDAZOLAM HCL 2 MG/2ML IJ SOLN
INTRAMUSCULAR | Status: DC | PRN
  Administered 2014-10-26: 1 mg via INTRAVENOUS

## 2014-10-26 MED ORDER — POVIDONE-IODINE 5 % OP SOLN
1.0000 "application " | Freq: Once | OPHTHALMIC | Status: AC
  Administered 2014-10-26: 1 via OPHTHALMIC

## 2014-10-26 MED ORDER — NA CHONDROIT SULF-NA HYALURON 40-17 MG/ML IO SOLN
INTRAOCULAR | Status: DC | PRN
  Administered 2014-10-26: 1 mL via INTRAOCULAR

## 2014-10-26 MED ORDER — EPINEPHRINE HCL 1 MG/ML IJ SOLN
INTRAMUSCULAR | Status: AC
  Filled 2014-10-26: qty 1

## 2014-10-26 MED ORDER — LIDOCAINE HCL (PF) 4 % IJ SOLN
INTRAMUSCULAR | Status: AC
  Filled 2014-10-26: qty 5

## 2014-10-26 MED ORDER — CEFUROXIME OPHTHALMIC INJECTION 1 MG/0.1 ML
INJECTION | OPHTHALMIC | Status: DC | PRN
  Administered 2014-10-26: 1 mg via INTRACAMERAL

## 2014-10-26 MED ORDER — CEFUROXIME OPHTHALMIC INJECTION 1 MG/0.1 ML
INJECTION | OPHTHALMIC | Status: AC
  Filled 2014-10-26: qty 0.1

## 2014-10-26 MED ORDER — SODIUM CHLORIDE 0.9 % IV SOLN
INTRAVENOUS | Status: DC
  Administered 2014-10-26: 08:00:00 via INTRAVENOUS

## 2014-10-26 MED ORDER — MOXIFLOXACIN HCL 0.5 % OP SOLN
OPHTHALMIC | Status: DC | PRN
  Administered 2014-10-26: 1 [drp] via OPHTHALMIC

## 2014-10-26 MED ORDER — MOXIFLOXACIN HCL 0.5 % OP SOLN
OPHTHALMIC | Status: AC
  Filled 2014-10-26: qty 3

## 2014-10-26 MED ORDER — TETRACAINE HCL 0.5 % OP SOLN
1.0000 [drp] | Freq: Once | OPHTHALMIC | Status: AC
  Administered 2014-10-26: 1 [drp] via OPHTHALMIC

## 2014-10-26 SURGICAL SUPPLY — 22 items
CANNULA ANT/CHMB 27GA (MISCELLANEOUS) ×2 IMPLANT
CUP MEDICINE 2OZ PLAST GRAD ST (MISCELLANEOUS) ×2 IMPLANT
GLOVE BIO SURGEON STRL SZ8 (GLOVE) ×2 IMPLANT
GLOVE BIOGEL M 6.5 STRL (GLOVE) ×2 IMPLANT
GLOVE SURG LX 8.0 MICRO (GLOVE) ×1
GLOVE SURG LX STRL 8.0 MICRO (GLOVE) ×1 IMPLANT
GOWN STRL REUS W/ TWL LRG LVL3 (GOWN DISPOSABLE) ×2 IMPLANT
GOWN STRL REUS W/TWL LRG LVL3 (GOWN DISPOSABLE) ×2
LENS IOL TECNIS 20.0 (Intraocular Lens) ×2 IMPLANT
LENS IOL TECNIS MONO 1P 20.0 (Intraocular Lens) ×1 IMPLANT
PACK CATARACT (MISCELLANEOUS) ×2 IMPLANT
PACK CATARACT BRASINGTON LX (MISCELLANEOUS) ×2 IMPLANT
PACK EYE AFTER SURG (MISCELLANEOUS) ×2 IMPLANT
SOL BSS BAG (MISCELLANEOUS) ×2
SOL PREP PVP 2OZ (MISCELLANEOUS) ×2
SOLUTION BSS BAG (MISCELLANEOUS) ×1 IMPLANT
SOLUTION PREP PVP 2OZ (MISCELLANEOUS) ×1 IMPLANT
SYR 3ML LL SCALE MARK (SYRINGE) ×2 IMPLANT
SYR 5ML LL (SYRINGE) ×2 IMPLANT
SYR TB 1ML 27GX1/2 LL (SYRINGE) ×2 IMPLANT
WATER STERILE IRR 1000ML POUR (IV SOLUTION) ×2 IMPLANT
WIPE NON LINTING 3.25X3.25 (MISCELLANEOUS) ×2 IMPLANT

## 2014-10-26 NOTE — Discharge Instructions (Signed)
AMBULATORY SURGERY  DISCHARGE INSTRUCTIONS   1) The drugs that you were given will stay in your system until tomorrow so for the next 24 hours you should not:  A) Drive an automobile B) Make any legal decisions C) Drink any alcoholic beverage   2) You may resume regular meals tomorrow.  Today it is better to start with liquids and gradually work up to solid foods.  You may eat anything you prefer, but it is better to start with liquids, then soup and crackers, and gradually work up to solid foods.   3) Please notify your doctor immediately if you have any unusual bleeding, trouble breathing, redness and pain at the surgery site, drainage, fever, or pain not relieved by medication.    4) Additional Instructions:    Eye Surgery Discharge Instructions  Expect mild scratchy sensation or mild soreness. DO NOT RUB YOUR EYE!  The day of surgery:  Minimal physical activity, but bed rest is not required  No reading, computer work, or close hand work  No bending, lifting, or straining.  May watch TV  For 24 hours:  No driving, legal decisions, or alcoholic beverages  Safety precautions  Eat anything you prefer: It is better to start with liquids, then soup then solid foods.  _____ Eye patch should be worn until postoperative exam tomorrow.  ____ Solar shield eyeglasses should be worn for comfort in the sunlight/patch while sleeping  Resume all regular medications including aspirin or Coumadin if these were discontinued prior to surgery. You may shower, bathe, shave, or wash your hair. Tylenol may be taken for mild discomfort.  Call your doctor if you experience significant pain, nausea, or vomiting, fever > 101 or other signs of infection. [phone] or [phone] Specific instructions:  Follow-up Information    Follow up with PORFILIO,WILLIAM LOUIS, MD In 1 day.   Specialty:  Ophthalmology   Why:  October 26 at 10:45am   Contact information:   [place] [id number] [phone]         Please contact your physician with any problems or Same Day Surgery at [phone], Monday through Friday 6 am to 4 pm, or  at [facility] number at [phone].

## 2014-10-26 NOTE — Op Note (Signed)
PREOPERATIVE DIAGNOSIS:  Nuclear sclerotic cataract of the right eye.   POSTOPERATIVE DIAGNOSIS: CATARACT   OPERATIVE PROCEDURE:  Procedure(s): CATARACT EXTRACTION PHACO AND INTRAOCULAR LENS PLACEMENT (IOC)   SURGEON:  [name], MD.   ANESTHESIA:  Anesthesiologist: Alvin Critchley, MD CRNA: Demetrius Charity, CRNA; Johnna Acosta, CRNA  1.      Managed anesthesia care. 2.      Topical tetracaine drops followed by 2% Xylocaine jelly applied in the preoperative holding area.   COMPLICATIONS:  None.   TECHNIQUE:   Stop and chop   DESCRIPTION OF PROCEDURE:  The patient was examined and consented in the preoperative holding area where the aforementioned topical anesthesia was applied to the right eye and then brought back to the Operating Room where the right eye was prepped and draped in the usual sterile ophthalmic fashion and a lid speculum was placed. A paracentesis was created with the side port blade and the anterior chamber was filled with viscoelastic. A near clear corneal incision was performed with the steel keratome. A continuous curvilinear capsulorrhexis was performed with a cystotome followed by the capsulorrhexis forceps. Hydrodissection and hydrodelineation were carried out with BSS on a blunt cannula. The lens was removed in a stop and chop  technique and the remaining cortical material was removed with the irrigation-aspiration handpiece. The capsular bag was inflated with viscoelastic and the Technis ZCB00  lens was placed in the capsular bag without complication. The remaining viscoelastic was removed from the eye with the irrigation-aspiration handpiece. The wounds were hydrated. The anterior chamber was flushed with Miostat and the eye was inflated to physiologic pressure. 0.1 mL of cefuroxime concentration 10 mg/mL was placed in the anterior chamber. The wounds were found to be water tight. The eye was dressed with Vigamox. The patient was given protective glasses to  wear throughout the day and a shield with which to sleep tonight. The patient was also given drops with which to begin a drop regimen today and will follow-up with me in one day.  Implant Name Type Inv. Item Serial No. Manufacturer Lot No. LRB No. Used  LENS IMPL INTRAOC ZCB00 20.0 - [id number] Intraocular Lens LENS IMPL INTRAOC ZCB00 20.0 [id number] [place] [id number] Right 1   Procedure(s) with comments: CATARACT EXTRACTION PHACO AND INTRAOCULAR LENS PLACEMENT (IOC) (Right) - LOT PACK: [id number] H US:01:43.0 AP:27.8 CDE:28.68  Electronically signed: [place] 10/26/2014 9:16 AM

## 2014-10-26 NOTE — Transfer of Care (Signed)
Immediate Anesthesia Transfer of Care Note  Patient: Ulisses Vondrak  Procedure(s) Performed: Procedure(s) with comments: CATARACT EXTRACTION PHACO AND INTRAOCULAR LENS PLACEMENT (IOC) (Right) - LOT PACK: [id number] H US:01:43.0 AP:27.8 CDE:28.68  Patient Location: PACU  Anesthesia Type:MAC  Level of Consciousness: awake, alert  and oriented  Airway & Oxygen Therapy: Patient Spontanous Breathing  Post-op Assessment: Report given to RN and Post -op Vital signs reviewed and stable  Post vital signs: Reviewed and stable  Last Vitals:  Filed Vitals:   10/26/14 0733  BP: 137/94  Pulse: 64  Temp: 36.4 C  Resp: 16    Complications: No apparent anesthesia complications

## 2014-10-26 NOTE — OR Nursing (Signed)
Patient takes metoprolol in the am and pm. He had his metoprolol last night at 1900. HR this am is 64. I Discussed this with Dr.Piscitello and he reports no need to give patient metoprolol this am.

## 2014-10-26 NOTE — Anesthesia Procedure Notes (Signed)
Procedure Name: MAC Performed by: Johnna Acosta Pre-anesthesia Checklist: Patient identified, Emergency Drugs available, Suction available and Patient being monitored Patient Re-evaluated:Patient Re-evaluated prior to inductionOxygen Delivery Method: Nasal cannula

## 2014-10-26 NOTE — H&P (Signed)
  All labs reviewed. Abnormal studies sent to patients PCP when indicated.  Previous H&P reviewed, patient examined, there are NO CHANGES.  Sean Willis LOUIS10/25/20168:45 AM

## 2014-10-26 NOTE — Anesthesia Preprocedure Evaluation (Signed)
Anesthesia Evaluation  Patient identified by MRN, date of birth, ID band Patient awake    Reviewed: Allergy & Precautions, H&P , NPO status , Patient's Chart, lab work & pertinent test results  Airway Mallampati: II  TM Distance: >3 FB Neck ROM: limited    Dental no notable dental hx. (+) Teeth Intact   Pulmonary neg pulmonary ROS, neg shortness of breath,    Pulmonary exam normal breath sounds clear to auscultation       Cardiovascular hypertension, + CAD, + Past MI, + CABG and + Peripheral Vascular Disease  Normal cardiovascular exam+ dysrhythmias  Rhythm:regular Rate:Normal     Neuro/Psych negative neurological ROS  negative psych ROS   GI/Hepatic Neg liver ROS, GERD  Controlled,  Endo/Other  negative endocrine ROS  Renal/GU Renal disease  negative genitourinary   Musculoskeletal  (+) Arthritis ,   Abdominal   Peds  Hematology negative hematology ROS (+)   Anesthesia Other Findings Past Medical History:   Hypertension                                                 GERD (gastroesophageal reflux disease)                       ED (erectile dysfunction)                                    Chronic insomnia                                             Hypertriglyceridemia                                         Osteoarthritis                                                 Comment:of the knee   DDD (degenerative disc disease), lumbar                      Colon polyp                                     10/11        Gout                                                         Chest wall pain  Comment:Right side, muscular,ortho,MRI. U/S abd ok. On               lyrica   Acute MI ([place])                                  4/13           Comment:S/P CABG X4 Sx done in [place]   Arrhythmia                                                     Comment:Post op a-fib w  episodes, several short burst               of PAF at cardiac rehab-CHADS score is 1   Carotid artery stenosis                                        Comment:Left 15-49 %   Dilatation of aorta (HCC)                                      Comment:Mild   Coronary artery disease                         04/2011         Comment:s/p CABG in setting of MI in [place], [place],               with initial PTCA of OM2 then CABG with LIMA to              LAD, SVG to D1, SVG to OM1,/R1 and SVG to               distal RCA   BPH (benign prostatic hyperplasia)                           Kidney stone                                                   Comment:uric acid, Dr [name]  renal insufficiency  Past Surgical History:   CORONARY ARTERY BYPASS GRAFT                     04/2011        Comment:Severe multivessel ASCAD s/p Stemi w PTCA of               OM2 then s/p CABG w LIMA to LAD,SVG to OM1 /RI               and SVG to Distal RCA    KIDNEY STONE SURGERY                                          FINGER SURGERY  BMI    Body Mass Index   26.86 kg/m 2      Reproductive/Obstetrics negative OB ROS                             Anesthesia Physical Anesthesia Plan  ASA: III  Anesthesia Plan: MAC   Post-op Pain Management:    Induction:   Airway Management Planned:   Additional Equipment:   Intra-op Plan:   Post-operative Plan:   Informed Consent: I have reviewed the patients History and Physical, chart, labs and discussed the procedure including the risks, benefits and alternatives for the proposed anesthesia with the patient or authorized representative who has indicated his/her understanding and acceptance.   Dental Advisory Given  Plan Discussed with: Anesthesiologist, CRNA and Surgeon  Anesthesia Plan Comments:         Anesthesia Quick Evaluation

## 2014-11-01 NOTE — Anesthesia Postprocedure Evaluation (Signed)
  Anesthesia Post-op Note  Patient: Sean Willis  Procedure(s) Performed: Procedure(s) with comments: CATARACT EXTRACTION PHACO AND INTRAOCULAR LENS PLACEMENT (IOC) (Right) - LOT PACK: [id number] H US:01:43.0 AP:27.8 CDE:28.68  Anesthesia type:MAC  Patient location: PACU  Post pain: Pain level controlled  Post assessment: Post-op Vital signs reviewed, Patient's Cardiovascular Status Stable, Respiratory Function Stable, Patent Airway and No signs of Nausea or vomiting  Post vital signs: Reviewed and stable  Last Vitals:  Filed Vitals:   10/26/14 0916  BP: 139/90  Pulse: 64  Temp: 36.8 C  Resp: 16    Level of consciousness: awake, alert  and patient cooperative  Complications: No apparent anesthesia complications

## 2014-11-24 ENCOUNTER — Encounter: Payer: Self-pay | Admitting: Cardiology

## 2014-11-24 ENCOUNTER — Ambulatory Visit (INDEPENDENT_AMBULATORY_CARE_PROVIDER_SITE_OTHER): Payer: Medicare Other | Admitting: Cardiology

## 2014-11-24 VITALS — BP 130/70 | HR 59 | Ht 69.0 in | Wt 186.0 lb

## 2014-11-24 DIAGNOSIS — I2583 Coronary atherosclerosis due to lipid rich plaque: Principal | ICD-10-CM

## 2014-11-24 DIAGNOSIS — I77819 Aortic ectasia, unspecified site: Secondary | ICD-10-CM

## 2014-11-24 DIAGNOSIS — I2510 Atherosclerotic heart disease of native coronary artery without angina pectoris: Secondary | ICD-10-CM

## 2014-11-24 DIAGNOSIS — E782 Mixed hyperlipidemia: Secondary | ICD-10-CM | POA: Diagnosis not present

## 2014-11-24 DIAGNOSIS — I10 Essential (primary) hypertension: Secondary | ICD-10-CM

## 2014-11-24 DIAGNOSIS — I779 Disorder of arteries and arterioles, unspecified: Secondary | ICD-10-CM

## 2014-11-24 DIAGNOSIS — I739 Peripheral vascular disease, unspecified: Secondary | ICD-10-CM

## 2014-11-24 LAB — LIPID PANEL
Cholesterol: 117 mg/dL — ABNORMAL LOW (ref 125–200)
HDL: 33 mg/dL — AB (ref >=40)
LDL CALC: 64 mg/dL (ref <130)
Total CHOL/HDL Ratio: 3.5 Ratio (ref <=5.0)
Triglycerides: 100 mg/dL (ref <150)
VLDL: 20 mg/dL (ref <30)

## 2014-11-24 LAB — BASIC METABOLIC PANEL
BUN: 19 mg/dL (ref 7–25)
CALCIUM: 9.2 mg/dL (ref 8.6–10.3)
CHLORIDE: 107 mmol/L (ref 98–110)
CO2: 23 mmol/L (ref 20–31)
CREATININE: 1.17 mg/dL (ref 0.70–1.25)
GLUCOSE: 90 mg/dL (ref 65–99)
Potassium: 4.5 mmol/L (ref 3.5–5.3)
SODIUM: 140 mmol/L (ref 135–146)

## 2014-11-24 LAB — HEPATIC FUNCTION PANEL
ALBUMIN: 4.2 g/dL (ref 3.6–5.1)
ALK PHOS: 78 U/L (ref 40–115)
ALT: 23 U/L (ref 9–46)
AST: 23 U/L (ref 10–35)
Bilirubin, Direct: 0.2 mg/dL (ref <=0.2)
Indirect Bilirubin: 0.5 mg/dL (ref 0.2–1.2)
TOTAL PROTEIN: 6.7 g/dL (ref 6.1–8.1)
Total Bilirubin: 0.7 mg/dL (ref 0.2–1.2)

## 2014-11-24 NOTE — Patient Instructions (Signed)
Medication Instructions:  Your physician recommends that you continue on your current medications as directed. Please refer to the Current Medication list given to you today.   Labwork: TODAY: BMET, LFTs, lipids  Testing/Procedures: Your physician has requested that you have a carotid duplex. This test is an ultrasound of the carotid arteries in your neck. It looks at blood flow through these arteries that supply the brain with blood. Allow one hour for this exam. There are no restrictions or special instructions.  Follow-Up: Your physician wants you to follow-up in: 1 year with [name]. You will receive a reminder letter in the mail two months in advance. If you don't receive a letter, please call our office to schedule the follow-up appointment.   Any Other Special Instructions Will Be Listed Below (If Applicable).     If you need a refill on your cardiac medications before your next appointment, please call your pharmacy.

## 2014-11-24 NOTE — Progress Notes (Signed)
Cardiology Office Note   Date:  11/24/2014   ID:  Sean Willis, DOB 25-May-1945, MRN [id number]  PCP:  [name], MD    Chief Complaint  Patient presents with  . Coronary Artery Disease  . Hypertension      History of Present Illness: Sean Willis is a 69 y.o. male with a history of ASCAD, HTN and dyslipidemia. He is doing well. He denies any chest pain, SOB, DOE, LE edema, dizziness, palpitations, claudication or syncope. He plays golf a few times weekly.  Past Medical History  Diagnosis Date  . Hypertension   . GERD (gastroesophageal reflux disease)   . ED (erectile dysfunction)   . Chronic insomnia   . Hypertriglyceridemia   . Osteoarthritis     of the knee  . DDD (degenerative disc disease), lumbar   . Colon polyp 10/11  . Gout   . Chest wall pain     Right side, muscular,ortho,MRI. U/S abd ok. On lyrica  . Acute MI ([place]) 4/13    S/P CABG X4 Sx done in [place]  . Arrhythmia     Post op a-fib w episodes, several short burst of PAF at cardiac rehab-CHADS score is 1  . Carotid artery stenosis     Left 15-49 %  . Dilatation of aorta (HCC)     Mild  . Coronary artery disease 04/2011    s/p CABG in setting of MI in [place], [place], with initial PTCA of OM2 then CABG with LIMA to LAD, SVG to D1, SVG to OM1,/R1 and SVG to distal RCA  . BPH (benign prostatic hyperplasia)   . Kidney stone     uric acid, Dr [name]  renal insufficiency    Past Surgical History  Procedure Laterality Date  . Coronary artery bypass graft  04/2011    Severe multivessel ASCAD s/p Stemi w PTCA of OM2 then s/p CABG w LIMA to LAD,SVG to OM1 /RI and SVG to Distal RCA   . Kidney stone surgery    . Finger surgery    . Cataract extraction w/phaco Right 10/26/2014    Procedure: CATARACT EXTRACTION PHACO AND INTRAOCULAR LENS PLACEMENT (IOC);  Surgeon: [name], MD;  Location: [facility] ORS;  Service: Ophthalmology;  Laterality: Right;  LOT PACK:  [id number] H US:01:43.0 AP:27.8 CDE:28.68     Current Outpatient Prescriptions  Medication Sig Dispense Refill  . allopurinol (ZYLOPRIM) 300 MG tablet Take 300 mg by mouth daily.  1  . aspirin (ASPIRIN EC) 81 MG EC tablet Take 81 mg by mouth daily. Swallow whole.    . clopidogrel (PLAVIX) 75 MG tablet Take 75 mg by mouth daily.    [name] co-enzyme Q-10 30 MG capsule Take 300 mg by mouth daily.    . colchicine 0.6 MG tablet Take 0.6 mg by mouth daily as needed.    . doxepin (SINEQUAN) 25 MG capsule Take 25 mg by mouth at bedtime.    [name] esomeprazole (NEXIUM) 40 MG capsule Take 40 mg by mouth daily as needed.    . fish oil-omega-3 fatty acids 1000 MG capsule Take 4 g by mouth daily.    [name] HYDROcodone-acetaminophen (NORCO) 7.5-325 MG per tablet Take 1 tablet by mouth every 6 (six) hours as needed for moderate pain.    . indomethacin (INDOCIN) 50 MG capsule Take 50 mg by mouth 2 (two) times daily as needed.    . loratadine (CLARITIN) 10 MG  tablet Take 10 mg by mouth daily as needed.    . metoprolol tartrate (LOPRESSOR) 25 MG tablet Take 25 mg by mouth as directed. 1/2 TABLET TWICE DAILY    . pravastatin (PRAVACHOL) 80 MG tablet Take 80 mg by mouth every evening.    . vitamin B-12 (CYANOCOBALAMIN) 100 MCG tablet Take 100 mcg by mouth daily.    [name] zolpidem (AMBIEN) 10 MG tablet Take 5 mg by mouth at bedtime as needed.     No current facility-administered medications for this visit.    Allergies:   Celebrex    Social History:  The patient  reports that he has never smoked. He does not have any smokeless tobacco history on file. He reports that he drinks alcohol. He reports that he does not use illicit drugs.   Family History:  The patient's family history includes Alzheimer's disease in his mother; Arrhythmia in his father and sister; CVA in his father; Cancer in his mother.    ROS:  Please see the history of present illness.   Otherwise, review of systems are positive for none.   All other systems  are reviewed and negative.    PHYSICAL EXAM: VS:  BP 130/70 mmHg  Pulse 59  Ht 5\' 9"  (1.753 m)  Wt 84.369 kg (186 lb)  BMI 27.45 kg/m2 , BMI Body mass index is 27.45 kg/(m^2). GEN: Well nourished, well developed, in no acute distress HEENT: normal Neck: no JVD, carotid bruits, or masses Cardiac: RRR; no murmurs, rubs, or gallops,no edema  Respiratory:  clear to auscultation bilaterally, normal work of breathing GI: soft, nontender, nondistended, + BS MS: no deformity or atrophy Skin: warm and dry, no rash Neuro:  Strength and sensation are intact Psych: euthymic mood, full affect   EKG:  EKG is ordered today. The ekg ordered today demonstrates sinus bradycardia at 59bpm with no ST changes   Recent Labs: 11/25/2013: ALT 37 03/03/2014: BUN 19; Creatinine, Ser 1.23; Potassium 4.2; Sodium 138    Lipid Panel    Component Value Date/Time   CHOL 107 11/25/2013 0854   CHOL 123 01/14/2013 0923   TRIG 80.0 11/25/2013 0854   TRIG 141 01/14/2013 0923   HDL 33.80* 11/25/2013 0854   HDL 34* 01/14/2013 0923   CHOLHDL 3 11/25/2013 0854   VLDL 16.0 11/25/2013 0854   LDLCALC 57 11/25/2013 0854   LDLCALC 61 01/14/2013 0923      Wt Readings from Last 3 Encounters:  11/24/14 84.369 kg (186 lb)  10/20/14 82.555 kg (182 lb)  05/05/14 83.462 kg (184 lb)      ASSESSMENT AND PLAN:    1.   ASCAD with no angina - continue ASA/Plavix - Lexiscan myoview to assess for ischemia. 2. HTN - well controlled - continue metprolol 3. Dyslipidemia - lipids at goal - continue pravastatin - check FLP and ALT  4.    Mild ascending aortic aneurysm 4.4cm by chest CT - Followed by Dr. [name]   5.      Carotid artery stenosis - recheck dopplers they have not been done in some time  Current medicines are reviewed at length with the patient today.  The patient does not have concerns regarding medicines.  The following changes  have been made:  no change  Labs/ tests ordered today: See above Assessment and Plan No orders of the defined types were placed in this encounter.     Disposition:   FU with me in 1 year  Signed, Sueanne Margarita, MD  11/24/2014 8:54 AM    [address], [place], [place]  [address] Phone: [phone]; Fax: [phone]

## 2014-11-29 ENCOUNTER — Other Ambulatory Visit: Payer: Self-pay | Admitting: Cardiology

## 2014-11-29 DIAGNOSIS — I739 Peripheral vascular disease, unspecified: Secondary | ICD-10-CM

## 2014-11-29 DIAGNOSIS — I779 Disorder of arteries and arterioles, unspecified: Secondary | ICD-10-CM

## 2014-11-29 DIAGNOSIS — E782 Mixed hyperlipidemia: Secondary | ICD-10-CM

## 2014-11-29 DIAGNOSIS — I6523 Occlusion and stenosis of bilateral carotid arteries: Secondary | ICD-10-CM

## 2014-12-02 DIAGNOSIS — I25810 Atherosclerosis of coronary artery bypass graft(s) without angina pectoris: Secondary | ICD-10-CM | POA: Diagnosis not present

## 2014-12-02 DIAGNOSIS — K219 Gastro-esophageal reflux disease without esophagitis: Secondary | ICD-10-CM | POA: Diagnosis not present

## 2014-12-02 DIAGNOSIS — I712 Thoracic aortic aneurysm, without rupture: Secondary | ICD-10-CM | POA: Diagnosis not present

## 2014-12-02 DIAGNOSIS — I252 Old myocardial infarction: Secondary | ICD-10-CM | POA: Diagnosis not present

## 2014-12-02 DIAGNOSIS — N183 Chronic kidney disease, stage 3 (moderate): Secondary | ICD-10-CM | POA: Diagnosis not present

## 2014-12-02 DIAGNOSIS — I10 Essential (primary) hypertension: Secondary | ICD-10-CM | POA: Diagnosis not present

## 2014-12-02 DIAGNOSIS — E782 Mixed hyperlipidemia: Secondary | ICD-10-CM | POA: Diagnosis not present

## 2014-12-02 DIAGNOSIS — M1990 Unspecified osteoarthritis, unspecified site: Secondary | ICD-10-CM | POA: Diagnosis not present

## 2014-12-02 DIAGNOSIS — M109 Gout, unspecified: Secondary | ICD-10-CM | POA: Diagnosis not present

## 2014-12-09 ENCOUNTER — Ambulatory Visit (HOSPITAL_COMMUNITY)
Admission: RE | Admit: 2014-12-09 | Discharge: 2014-12-09 | Disposition: A | Discharge status: Home / Self Care | Payer: Medicare Other | Source: Ambulatory Visit | Attending: Cardiology | Admitting: Cardiology

## 2014-12-09 DIAGNOSIS — I6523 Occlusion and stenosis of bilateral carotid arteries: Secondary | ICD-10-CM | POA: Diagnosis not present

## 2014-12-09 DIAGNOSIS — E782 Mixed hyperlipidemia: Secondary | ICD-10-CM | POA: Diagnosis not present

## 2014-12-09 DIAGNOSIS — I779 Disorder of arteries and arterioles, unspecified: Secondary | ICD-10-CM

## 2014-12-09 DIAGNOSIS — I6522 Occlusion and stenosis of left carotid artery: Secondary | ICD-10-CM | POA: Diagnosis not present

## 2014-12-09 DIAGNOSIS — I739 Peripheral vascular disease, unspecified: Secondary | ICD-10-CM

## 2014-12-10 ENCOUNTER — Telehealth: Payer: Self-pay | Admitting: Cardiology

## 2014-12-10 DIAGNOSIS — I779 Disorder of arteries and arterioles, unspecified: Secondary | ICD-10-CM

## 2014-12-10 DIAGNOSIS — I739 Peripheral vascular disease, unspecified: Principal | ICD-10-CM

## 2014-12-10 NOTE — Telephone Encounter (Signed)
Left message to call back  

## 2014-12-10 NOTE — Telephone Encounter (Signed)
New problem   Pt stated he returning a call from someone here.

## 2014-12-10 NOTE — Telephone Encounter (Signed)
Informed patient of results and verbal understanding expressed.   Repeat carotids ordered to be scheduled in 2 years. Patient agrees with treatment plan. 

## 2014-12-10 NOTE — Telephone Encounter (Signed)
-----   Message from Sueanne Margarita, MD sent at 12/09/2014 10:41 PM EST ----- 1-39% left ICA stenosis - repeat study in 2 years - He is on ASA and plavix

## 2015-04-21 ENCOUNTER — Other Ambulatory Visit: Payer: Self-pay | Admitting: *Deleted

## 2015-04-21 DIAGNOSIS — I712 Thoracic aortic aneurysm, without rupture, unspecified: Secondary | ICD-10-CM

## 2015-05-11 ENCOUNTER — Other Ambulatory Visit: Payer: Medicare Other

## 2015-05-11 ENCOUNTER — Encounter: Payer: Medicare Other | Admitting: Surgery

## 2015-05-12 ENCOUNTER — Other Ambulatory Visit: Payer: Medicare Other

## 2015-05-16 DIAGNOSIS — M1009 Idiopathic gout, multiple sites: Secondary | ICD-10-CM | POA: Diagnosis not present

## 2015-05-16 DIAGNOSIS — M150 Primary generalized (osteo)arthritis: Secondary | ICD-10-CM | POA: Diagnosis not present

## 2015-05-16 DIAGNOSIS — M79642 Pain in left hand: Secondary | ICD-10-CM | POA: Diagnosis not present

## 2015-05-16 DIAGNOSIS — M2550 Pain in unspecified joint: Secondary | ICD-10-CM | POA: Diagnosis not present

## 2015-05-16 DIAGNOSIS — M79671 Pain in right foot: Secondary | ICD-10-CM | POA: Diagnosis not present

## 2015-05-16 DIAGNOSIS — M79672 Pain in left foot: Secondary | ICD-10-CM | POA: Diagnosis not present

## 2015-05-16 DIAGNOSIS — M79641 Pain in right hand: Secondary | ICD-10-CM | POA: Diagnosis not present

## 2015-05-18 ENCOUNTER — Ambulatory Visit (INDEPENDENT_AMBULATORY_CARE_PROVIDER_SITE_OTHER): Payer: Medicare Other | Admitting: Surgery

## 2015-05-18 ENCOUNTER — Encounter: Payer: Self-pay | Admitting: Surgery

## 2015-05-18 ENCOUNTER — Ambulatory Visit
Admission: RE | Admit: 2015-05-18 | Discharge: 2015-05-18 | Disposition: A | Discharge status: Home / Self Care | Payer: Medicare Other | Source: Ambulatory Visit | Attending: Surgery | Admitting: Surgery

## 2015-05-18 VITALS — BP 145/90 | HR 60 | Resp 20 | Ht 69.0 in | Wt 186.0 lb

## 2015-05-18 DIAGNOSIS — I712 Thoracic aortic aneurysm, without rupture, unspecified: Secondary | ICD-10-CM

## 2015-05-18 IMAGING — CT CT ANGIO CHEST
2 of 5 series · 8 of 30 positions shown · IV contrast (75CC ISOVUE 370)
Comparison: [DATE]

CLINICAL DATA: History of thoracic aortic aneurysm

EXAM:
CT ANGIOGRAPHY CHEST WITH CONTRAST
TECHNIQUE: Multidetector CT imaging of the chest was performed using the
standard protocol during bolus administration of intravenous
contrast. Multiplanar CT image reconstructions and MIPs were
obtained to evaluate the vascular anatomy.
CONTRAST:  75 mL Isovue 370.
Creatinine was obtained on site at [HOSPITAL] at [REDACTED].Results: Creatinine 0.9 mg/dL.

[Series 4: angio · axial · 0.71mm/px · z∈[-191,-46]mm · 3 of 117 slices shown]
[im 30/117  lung]
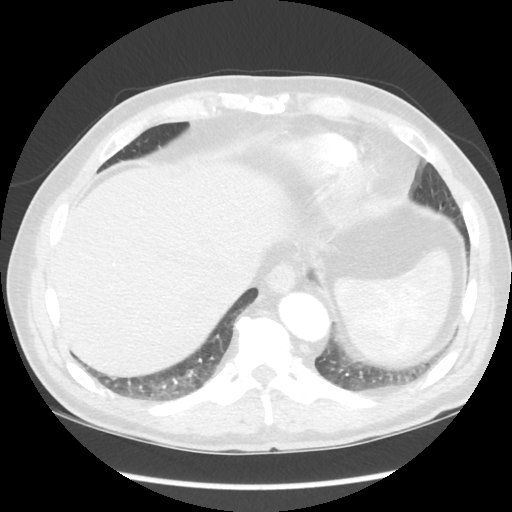
[im 59/117  mediastinal]
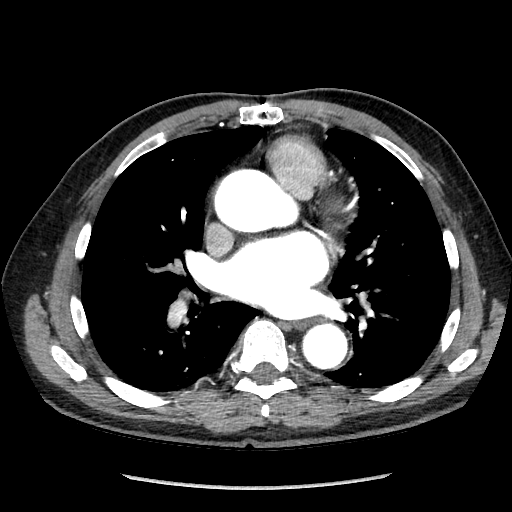
[im 88/117  lung]
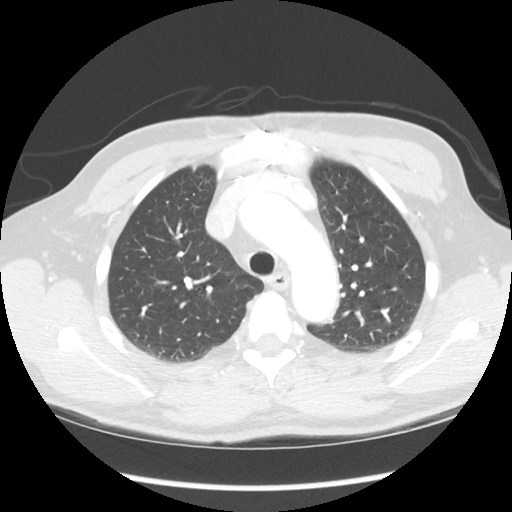

[Series 602: sagittal body · sagittal · 0.71mm/px · 5 of 146 slices shown]
[im 25/146  lung]
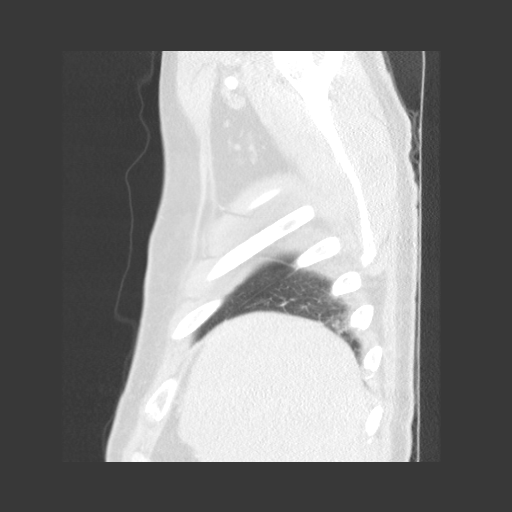
[im 49/146  lung]
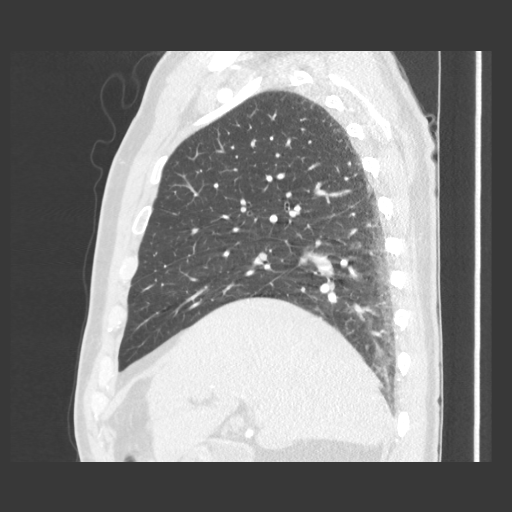
[im 73/146  lung]
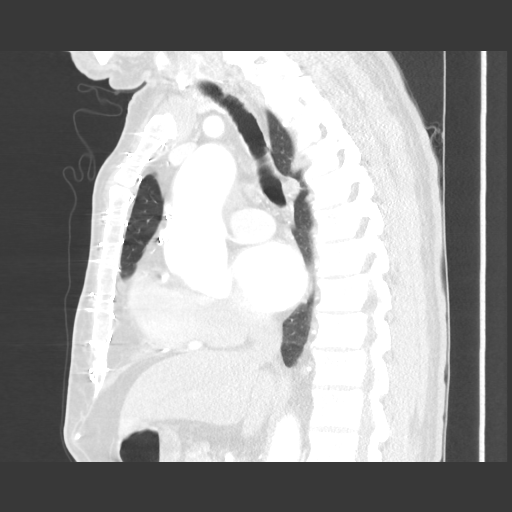
[im 97/146  lung]
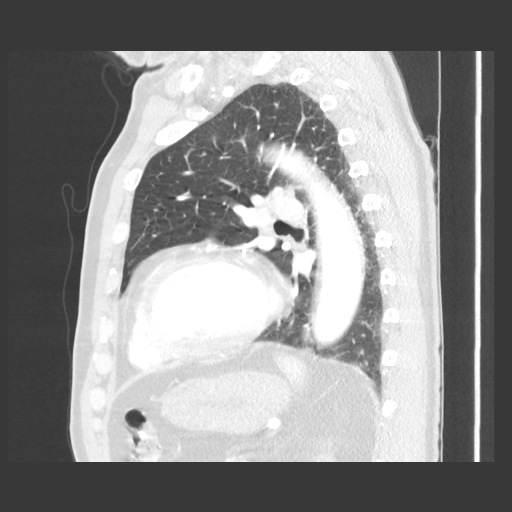
[im 121/146  lung]
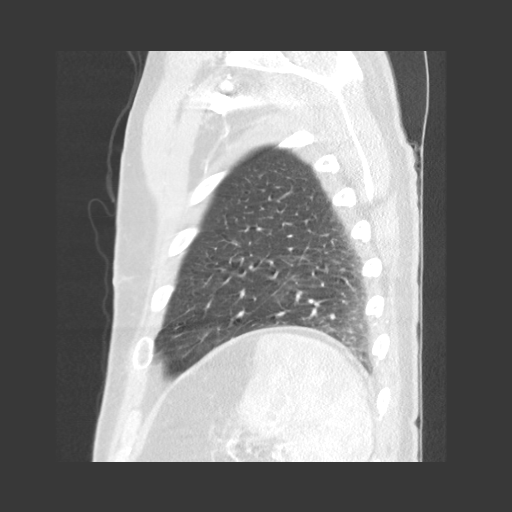

[8 of 30 positions shown; findings below may reference images not displayed]

FINDINGS: Lungs are well aerated bilaterally. No focal infiltrate or sizable
effusion is seen. No parenchymal nodules are noted.

The thoracic aorta again demonstrates changes of coronary bypass
grafting. Dilatation of the ascending aorta is seen to 4.5 cm. This
is slightly greater than that seen on the prior exam of 4.4 cm. The
aorta measures 3.6 cm at the level of the TIGER junction. The
measurement at the level of the sinus of Valsalva is approximately
4.1 cm. The origins of the brachiocephalic vessels are within normal
limits. The left vertebral artery arises directly from the aorta.
The ascending aorta demonstrates some tapering and the descending
aorta measures approximately 3.1 cm in greatest dimension.

The pulmonary artery is visualize is within normal limits. No hilar
or mediastinal adenopathy is seen. Prominent left atrial appendage
is noted. No thrombus is seen.

The visualized upper abdomen reveals no acute abnormality. The
osseous structures show degenerative change of the thoracic spine.

Review of the MIP images confirms the above findings.
IMPRESSION: Stable thoracic aortic aneurysm in the ascending portion. Ascending
thoracic aortic aneurysm. Recommend semi-annual imaging followup by
CTA or MRA. This recommendation follows [CJ]
ACCF/AHA/AATS/ACR/ASA/SCA/TIGER/TIGER/TIGER/TIGER Guidelines for the
Diagnosis and Management of Patients With Thoracic Aortic Disease.
Circulation. [CJ]; 121: [id number]

The remainder of the exam is stable from the prior study.

## 2015-05-18 MED ORDER — IOPAMIDOL (ISOVUE-370) INJECTION 76%
75.0000 mL | Freq: Once | INTRAVENOUS | Status: AC | PRN
  Administered 2015-05-18: 75 mL via INTRAVENOUS

## 2015-05-18 NOTE — Progress Notes (Signed)
HPI:  The patient returns for follow up of a 4.4 cm ascending aortic aneurysm found incidentally on echo and CT scan in 04/2013. He has done well over the past year without any chest pain or shortness of breath.  Current Outpatient Prescriptions  Medication Sig Dispense Refill  . allopurinol (ZYLOPRIM) 300 MG tablet Take 300 mg by mouth daily.  1  . aspirin (ASPIRIN EC) 81 MG EC tablet Take 81 mg by mouth daily. Swallow whole.    . clopidogrel (PLAVIX) 75 MG tablet Take 75 mg by mouth daily.    [name] co-enzyme Q-10 30 MG capsule Take 300 mg by mouth daily.    . colchicine 0.6 MG tablet Take 0.6 mg by mouth daily as needed.    . doxepin (SINEQUAN) 25 MG capsule Take 25 mg by mouth at bedtime.    [name] esomeprazole (NEXIUM) 40 MG capsule Take 40 mg by mouth daily as needed.    . fish oil-omega-3 fatty acids 1000 MG capsule Take 4 g by mouth daily.    [name] HYDROcodone-acetaminophen (NORCO) 7.5-325 MG per tablet Take 1 tablet by mouth every 6 (six) hours as needed for moderate pain.    . indomethacin (INDOCIN) 50 MG capsule Take 50 mg by mouth 2 (two) times daily as needed.    . loratadine (CLARITIN) 10 MG tablet Take 10 mg by mouth daily as needed.    . metoprolol tartrate (LOPRESSOR) 25 MG tablet Take 25 mg by mouth as directed. 1/2 TABLET TWICE DAILY    . pravastatin (PRAVACHOL) 80 MG tablet Take 80 mg by mouth every evening.    . vitamin B-12 (CYANOCOBALAMIN) 100 MCG tablet Take 100 mcg by mouth daily.    [name] zolpidem (AMBIEN) 10 MG tablet Take 5 mg by mouth at bedtime as needed.     No current facility-administered medications for this visit.     Physical Exam: BP 145/90 mmHg  Pulse 60  Resp 20  Ht 5\' 9"  (1.753 m)  Wt 186 lb (84.369 kg)  BMI 27.45 kg/m2  SpO2 98% He looks well Cardiac exam shows a regular rate and rhythm with normal heart sounds. Lungs are clear  Diagnostic Tests:  CLINICAL DATA: History of thoracic aortic aneurysm  EXAM: CT ANGIOGRAPHY CHEST WITH  CONTRAST  TECHNIQUE: Multidetector CT imaging of the chest was performed using the standard protocol during bolus administration of intravenous contrast. Multiplanar CT image reconstructions and MIPs were obtained to evaluate the vascular anatomy.  CONTRAST: 75 mL Isovue 370.  Creatinine was obtained on site at [place] at [address]Results: Creatinine 0.9 mg/dL.  COMPARISON: 03/12/2014  FINDINGS: Lungs are well aerated bilaterally. No focal infiltrate or sizable effusion is seen. No parenchymal nodules are noted.  The thoracic aorta again demonstrates changes of coronary bypass grafting. Dilatation of the ascending aorta is seen to 4.5 cm. This is slightly greater than that seen on the prior exam of 4.4 cm. The aorta measures 3.6 cm at the level of the sino-tubular junction. The measurement at the level of the sinus of Valsalva is approximately 4.1 cm. The origins of the brachiocephalic vessels are within normal limits. The left vertebral artery arises directly from the aorta. The ascending aorta demonstrates some tapering and the descending aorta measures approximately 3.1 cm in greatest dimension.  The pulmonary artery is visualize is within normal limits. No hilar or mediastinal adenopathy is seen. Prominent left atrial appendage is noted. No thrombus is seen.  The visualized  upper abdomen reveals no acute abnormality. The osseous structures show degenerative change of the thoracic spine.  Review of the MIP images confirms the above findings.  IMPRESSION: Stable thoracic aortic aneurysm in the ascending portion. Ascending thoracic aortic aneurysm. Recommend semi-annual imaging followup by CTA or MRA. This recommendation follows 2010 ACCF/AHA/AATS/ACR/ASA/SCA/SCAI/SIR/STS/SVM Guidelines for the Diagnosis and Management of Patients With Thoracic Aortic Disease. Circulation. 2010; [id number]  The remainder of the exam is stable from  the prior study.   Electronically Signed  By: Inez Catalina M.D.  On: 05/18/2015 14:18   Impression:  There has been no significant change in the size of his 4.5 cm fusiform ascending aortic aneurysm over the past year. His BP is adequately controlled and he is on a beta blocker.  Plan:  I will see him again in one year with a CTA of the chest.   Gaye Pollack, MD [facility] [phone]

## 2015-06-01 DIAGNOSIS — H2512 Age-related nuclear cataract, left eye: Secondary | ICD-10-CM | POA: Diagnosis not present

## 2015-06-16 DIAGNOSIS — M2550 Pain in unspecified joint: Secondary | ICD-10-CM | POA: Diagnosis not present

## 2015-06-16 DIAGNOSIS — M1009 Idiopathic gout, multiple sites: Secondary | ICD-10-CM | POA: Diagnosis not present

## 2015-06-16 DIAGNOSIS — M150 Primary generalized (osteo)arthritis: Secondary | ICD-10-CM | POA: Diagnosis not present

## 2015-07-19 DIAGNOSIS — Z1389 Encounter for screening for other disorder: Secondary | ICD-10-CM | POA: Diagnosis not present

## 2015-07-19 DIAGNOSIS — N200 Calculus of kidney: Secondary | ICD-10-CM | POA: Diagnosis not present

## 2015-07-19 DIAGNOSIS — E782 Mixed hyperlipidemia: Secondary | ICD-10-CM | POA: Diagnosis not present

## 2015-07-19 DIAGNOSIS — M1990 Unspecified osteoarthritis, unspecified site: Secondary | ICD-10-CM | POA: Diagnosis not present

## 2015-07-19 DIAGNOSIS — I10 Essential (primary) hypertension: Secondary | ICD-10-CM | POA: Diagnosis not present

## 2015-07-19 DIAGNOSIS — Z0000 Encounter for general adult medical examination without abnormal findings: Secondary | ICD-10-CM | POA: Diagnosis not present

## 2015-07-19 DIAGNOSIS — I25810 Atherosclerosis of coronary artery bypass graft(s) without angina pectoris: Secondary | ICD-10-CM | POA: Diagnosis not present

## 2015-07-19 DIAGNOSIS — Z1211 Encounter for screening for malignant neoplasm of colon: Secondary | ICD-10-CM | POA: Diagnosis not present

## 2015-07-19 DIAGNOSIS — I252 Old myocardial infarction: Secondary | ICD-10-CM | POA: Diagnosis not present

## 2015-07-19 DIAGNOSIS — I712 Thoracic aortic aneurysm, without rupture: Secondary | ICD-10-CM | POA: Diagnosis not present

## 2015-07-19 DIAGNOSIS — N183 Chronic kidney disease, stage 3 (moderate): Secondary | ICD-10-CM | POA: Diagnosis not present

## 2015-07-19 DIAGNOSIS — K219 Gastro-esophageal reflux disease without esophagitis: Secondary | ICD-10-CM | POA: Diagnosis not present

## 2015-07-19 DIAGNOSIS — M109 Gout, unspecified: Secondary | ICD-10-CM | POA: Diagnosis not present

## 2015-07-19 DIAGNOSIS — Z125 Encounter for screening for malignant neoplasm of prostate: Secondary | ICD-10-CM | POA: Diagnosis not present

## 2015-07-19 DIAGNOSIS — G4700 Insomnia, unspecified: Secondary | ICD-10-CM | POA: Diagnosis not present

## 2015-08-11 ENCOUNTER — Other Ambulatory Visit: Payer: Self-pay | Admitting: Gastroenterology

## 2015-08-16 ENCOUNTER — Telehealth: Payer: Self-pay | Admitting: *Deleted

## 2015-08-16 NOTE — Telephone Encounter (Signed)
Pt states is Dr. Lenn Sink says that he has psoriatic arthritis and he wanted to know if we could help with the pain. I left a message instructing pt to make an appt to discuss his symptoms with one of our doctors, often there are treatment we can do or recommend, and may possibly be referred to a Rheumatologist.

## 2015-09-22 ENCOUNTER — Encounter (HOSPITAL_COMMUNITY): Payer: Self-pay | Admitting: *Deleted

## 2015-09-27 ENCOUNTER — Encounter (HOSPITAL_COMMUNITY)
Admission: RE | Disposition: A | Discharge status: Home / Self Care | Payer: Self-pay | Source: Ambulatory Visit | Attending: Gastroenterology

## 2015-09-27 ENCOUNTER — Ambulatory Visit (HOSPITAL_COMMUNITY): Payer: Medicare Other | Admitting: Anesthesiology

## 2015-09-27 ENCOUNTER — Ambulatory Visit (HOSPITAL_COMMUNITY)
Admission: RE | Admit: 2015-09-27 | Discharge: 2015-09-27 | Disposition: A | Discharge status: Home / Self Care | Payer: Medicare Other | Source: Ambulatory Visit | Attending: Gastroenterology | Admitting: Gastroenterology

## 2015-09-27 ENCOUNTER — Encounter (HOSPITAL_COMMUNITY): Payer: Self-pay

## 2015-09-27 DIAGNOSIS — Z951 Presence of aortocoronary bypass graft: Secondary | ICD-10-CM | POA: Insufficient documentation

## 2015-09-27 DIAGNOSIS — E781 Pure hyperglyceridemia: Secondary | ICD-10-CM | POA: Insufficient documentation

## 2015-09-27 DIAGNOSIS — Z7902 Long term (current) use of antithrombotics/antiplatelets: Secondary | ICD-10-CM | POA: Insufficient documentation

## 2015-09-27 DIAGNOSIS — M109 Gout, unspecified: Secondary | ICD-10-CM | POA: Diagnosis not present

## 2015-09-27 DIAGNOSIS — K219 Gastro-esophageal reflux disease without esophagitis: Secondary | ICD-10-CM | POA: Insufficient documentation

## 2015-09-27 DIAGNOSIS — Z7982 Long term (current) use of aspirin: Secondary | ICD-10-CM | POA: Insufficient documentation

## 2015-09-27 DIAGNOSIS — K635 Polyp of colon: Secondary | ICD-10-CM | POA: Insufficient documentation

## 2015-09-27 DIAGNOSIS — D122 Benign neoplasm of ascending colon: Secondary | ICD-10-CM | POA: Insufficient documentation

## 2015-09-27 DIAGNOSIS — K5730 Diverticulosis of large intestine without perforation or abscess without bleeding: Secondary | ICD-10-CM | POA: Diagnosis not present

## 2015-09-27 DIAGNOSIS — Z86010 Personal history of colonic polyps: Secondary | ICD-10-CM | POA: Insufficient documentation

## 2015-09-27 DIAGNOSIS — I129 Hypertensive chronic kidney disease with stage 1 through stage 4 chronic kidney disease, or unspecified chronic kidney disease: Secondary | ICD-10-CM | POA: Diagnosis not present

## 2015-09-27 DIAGNOSIS — I252 Old myocardial infarction: Secondary | ICD-10-CM | POA: Insufficient documentation

## 2015-09-27 DIAGNOSIS — Z79899 Other long term (current) drug therapy: Secondary | ICD-10-CM | POA: Insufficient documentation

## 2015-09-27 DIAGNOSIS — I10 Essential (primary) hypertension: Secondary | ICD-10-CM | POA: Insufficient documentation

## 2015-09-27 DIAGNOSIS — I2510 Atherosclerotic heart disease of native coronary artery without angina pectoris: Secondary | ICD-10-CM | POA: Insufficient documentation

## 2015-09-27 DIAGNOSIS — D125 Benign neoplasm of sigmoid colon: Secondary | ICD-10-CM | POA: Diagnosis not present

## 2015-09-27 DIAGNOSIS — Z1211 Encounter for screening for malignant neoplasm of colon: Secondary | ICD-10-CM | POA: Diagnosis not present

## 2015-09-27 HISTORY — PX: COLONOSCOPY WITH PROPOFOL: SHX5780

## 2015-09-27 SURGERY — COLONOSCOPY WITH PROPOFOL
Anesthesia: Monitor Anesthesia Care

## 2015-09-27 MED ORDER — LIDOCAINE 2% (20 MG/ML) 5 ML SYRINGE
INTRAMUSCULAR | Status: DC | PRN
  Administered 2015-09-27: 50 mg via INTRAVENOUS

## 2015-09-27 MED ORDER — PROPOFOL 10 MG/ML IV BOLUS
INTRAVENOUS | Status: DC | PRN
  Administered 2015-09-27: 20 mg via INTRAVENOUS
  Administered 2015-09-27: 30 mg via INTRAVENOUS

## 2015-09-27 MED ORDER — PROPOFOL 10 MG/ML IV BOLUS
INTRAVENOUS | Status: AC
  Filled 2015-09-27: qty 60

## 2015-09-27 MED ORDER — LACTATED RINGERS IV SOLN
INTRAVENOUS | Status: DC
  Administered 2015-09-27: 1000 mL via INTRAVENOUS

## 2015-09-27 MED ORDER — SODIUM CHLORIDE 0.9 % IV SOLN: INTRAVENOUS | Status: DC

## 2015-09-27 MED ORDER — PROPOFOL 500 MG/50ML IV EMUL
INTRAVENOUS | Status: DC | PRN
  Administered 2015-09-27: 125 ug/kg/min via INTRAVENOUS

## 2015-09-27 MED ORDER — LIDOCAINE 2% (20 MG/ML) 5 ML SYRINGE
INTRAMUSCULAR | Status: AC
Start: 2015-09-27 — End: 2015-09-27
  Filled 2015-09-27: qty 5

## 2015-09-27 SURGICAL SUPPLY — 21 items

## 2015-09-27 NOTE — Anesthesia Procedure Notes (Signed)
Procedure Name: MAC Date/Time: 09/27/2015 7:40 AM Performed by: Dione Booze Pre-anesthesia Checklist: Patient identified, Emergency Drugs available, Patient being monitored and Suction available Patient Re-evaluated:Patient Re-evaluated prior to inductionOxygen Delivery Method: Simple face mask Placement Confirmation: positive ETCO2

## 2015-09-27 NOTE — Anesthesia Preprocedure Evaluation (Signed)
Anesthesia Evaluation  Patient identified by MRN, date of birth, ID band Patient awake    Reviewed: Allergy & Precautions, NPO status , Patient's Chart, lab work & pertinent test results  Airway Mallampati: II  TM Distance: >3 FB Neck ROM: Full    Dental no notable dental hx.    Pulmonary neg pulmonary ROS,    Pulmonary exam normal breath sounds clear to auscultation       Cardiovascular hypertension, + CAD, + Past MI and + Peripheral Vascular Disease  Normal cardiovascular exam+ dysrhythmias  Rhythm:Regular Rate:Normal  Thoracic aortic aneurysm   Neuro/Psych negative neurological ROS  negative psych ROS   GI/Hepatic Neg liver ROS, GERD  ,  Endo/Other  negative endocrine ROS  Renal/GU Renal disease  negative genitourinary   Musculoskeletal  (+) Arthritis ,   Abdominal   Peds negative pediatric ROS (+)  Hematology negative hematology ROS (+)   Anesthesia Other Findings   Reproductive/Obstetrics negative OB ROS                             Anesthesia Physical Anesthesia Plan  ASA: III  Anesthesia Plan: MAC   Post-op Pain Management:    Induction: Intravenous  Airway Management Planned: Natural Airway  Additional Equipment:   Intra-op Plan:   Post-operative Plan:   Informed Consent: I have reviewed the patients History and Physical, chart, labs and discussed the procedure including the risks, benefits and alternatives for the proposed anesthesia with the patient or authorized representative who has indicated his/her understanding and acceptance.   Dental advisory given  Plan Discussed with: CRNA  Anesthesia Plan Comments:         Anesthesia Quick Evaluation

## 2015-09-27 NOTE — Anesthesia Postprocedure Evaluation (Signed)
Anesthesia Post Note  Patient: Sean Willis  Procedure(s) Performed: Procedure(s) (LRB): COLONOSCOPY WITH PROPOFOL ([name])  Patient location during evaluation: PACU Anesthesia Type: MAC Level of consciousness: awake and alert Pain management: pain level controlled Vital Signs Assessment: post-procedure vital signs reviewed and stable Respiratory status: spontaneous breathing, nonlabored ventilation, respiratory function stable and patient connected to nasal cannula oxygen Cardiovascular status: stable and blood pressure returned to baseline Anesthetic complications: no    Last Vitals:  Vitals:   09/27/15 0830 09/27/15 0840  BP: (!) 137/94 135/86  Pulse: (!) 54 (!) 56  Resp: 13 14  Temp:      Last Pain:  Vitals:   09/27/15 0810  TempSrc: Oral                 Tayson Schnelle J

## 2015-09-27 NOTE — Discharge Instructions (Signed)

## 2015-09-27 NOTE — Transfer of Care (Signed)
Immediate Anesthesia Transfer of Care Note  Patient: Sean Willis  Procedure(s) Performed: Procedure(s): COLONOSCOPY WITH PROPOFOL ([name])  Patient Location: PACU and Endoscopy Unit  Anesthesia Type:MAC  Level of Consciousness: sedated and patient cooperative  Airway & Oxygen Therapy: Patient Spontanous Breathing and Patient connected to face mask oxygen  Post-op Assessment: Report given to RN and Post -op Vital signs reviewed and stable  Post vital signs: Reviewed and stable  Last Vitals:  Vitals:   09/27/15 0637  BP: (!) 135/93  Pulse: (!) 55  Resp: 13  Temp: 36.6 C    Last Pain:  Vitals:   09/27/15 0637  TempSrc: Oral         Complications: No apparent anesthesia complications

## 2015-09-27 NOTE — H&P (Signed)
Procedure: Surveillance colonoscopy. 10/18/2009 normal surveillance colonoscopy was performed. Small adenomatous colon polyps removed colonoscopically in 2007. Chronic aspirin and Plavix therapy.  History: The patient is a 70 year old male born 1945/12/31. He is scheduled to undergo a surveillance colonoscopy today. He was instructed to stop taking Plavix one week prior to today's colonoscopy. He was instructed to not stop taking aspirin prior to his colonoscopy.  Past medical history: Hypertension. Gastroesophageal reflux. Uric acid kidney stone. Hypertriglyceridemia. Osteoarthritis. Lumbar and cervical degenerative disc disease. Gout. Acute myocardial infarction, coronary artery bypass grafting in April 2013. Carotid artery disease. Thoracic aortic aneurysm. Tonsillectomy. Ganglion cyst surgery on his finger. Arthroscopic knee surgery. Right cataract surgery. Foot surgery.  Medication allergies: Celebrex causes nausea  Exam: The patient is alert and lying comfortably on the endoscopy stretcher. Abdomen is soft and nontender to palpation. Lungs are clear to auscultation. Cardiac exam reveals a regular rhythm.  Plan: Proceed with surveillance colonoscopy

## 2015-09-27 NOTE — Op Note (Signed)
[facility] Patient Name: Sean Willis Procedure Date: 09/27/2015 MRN: [id number] Attending MD: [name] , MD Date of Birth: [birth year] CSN: [id number] Age: 70 Admit Type: Emergency Department Procedure:                Colonoscopy Indications:              High risk colon cancer surveillance: Personal                            history of non-advanced adenoma Providers:                [name], MD, Laverta Baltimore RN, RN, Alfonso Patten, Technician, Dione Booze, CRNA Referring MD:              Medicines:                Propofol per Anesthesia Complications:            No immediate complications. Estimated Blood Loss:     Estimated blood loss: none. Procedure:                Pre-Anesthesia Assessment:                           - Prior to the procedure, a History and Physical                            was performed, and patient medications and                            allergies were reviewed. The patient's tolerance of                            previous anesthesia was also reviewed. The risks                            and benefits of the procedure and the sedation                            options and risks were discussed with the patient.                            All questions were answered, and informed consent                            was obtained. Prior Anticoagulants: The patient has                            taken Plavix (clopidogrel), last dose was 7 days                            prior to procedure. ASA Grade Assessment: III - A  patient with severe systemic disease. After                            reviewing the risks and benefits, the patient was                            deemed in satisfactory condition to undergo the                            procedure.                           After obtaining informed consent, the colonoscope                            was passed  under direct vision. Throughout the                            procedure, the patient's blood pressure, pulse, and                            oxygen saturations were monitored continuously. The                            [id number]) scope was introduced through                            the anus and advanced to the the cecum, identified                            by appendiceal orifice and ileocecal valve. The                            colonoscopy was performed without difficulty. The                            patient tolerated the procedure well. The quality                            of the bowel preparation was good. The ileocecal                            valve, the appendiceal orifice and the rectum were                            photographed. Scope In: 7:46:22 AM Scope Out: 8:03:50 AM Scope Withdrawal Time: 0 hours 12 minutes 12 seconds  Total Procedure Duration: 0 hours 17 minutes 28 seconds  Findings:      The perianal and digital rectal examinations were normal.      A 4 mm polyp was found in the ascending colon. The polyp was sessile.       The polyp was removed with a cold snare. Resection and retrieval were       complete.      A 3 mm polyp  was found in the sigmoid colon. The polyp was sessile. The       polyp was removed with a cold biopsy forceps. Resection and retrieval       were complete.      Multiple small and large-mouthed diverticula were found in the sigmoid       colon.      The exam was otherwise without abnormality. Impression:               - One 4 mm polyp in the ascending colon, removed                            with a cold snare. Resected and retrieved.                           - One 3 mm polyp in the sigmoid colon, removed with                            a cold biopsy forceps. Resected and retrieved.                           - Diverticulosis in the sigmoid colon.                           - The examination was otherwise normal. Moderate  Sedation:      [name]- Per Anesthesia Care Recommendation:           - Patient has a contact number available for                            emergencies. The signs and symptoms of potential                            delayed complications were discussed with the                            patient. Return to normal activities tomorrow.                            Written discharge instructions were provided to the                            patient.                           - Repeat colonoscopy is not recommended for                            surveillance.                           - Resume previous diet.                           - Continue present medications. Procedure Code(s):        --- Professional ---  45385, Colonoscopy, flexible; with removal of                            tumor(s), polyp(s), or other lesion(s) by snare                            technique                           45380, 59, Colonoscopy, flexible; with biopsy,                            single or multiple Diagnosis Code(s):        --- Professional ---                           Z86.010, Personal history of colonic polyps                           D12.2, Benign neoplasm of ascending colon                           D12.5, Benign neoplasm of sigmoid colon                           K57.30, Diverticulosis of large intestine without                            perforation or abscess without bleeding CPT copyright 2016 American [employer] Association. All rights reserved. The codes documented in this report are preliminary and upon coder review may  be revised to meet current compliance requirements. Earle Gell, MD [name], MD 09/27/2015 8:12:05 AM This report has been signed electronically. Number of Addenda: 0

## 2015-09-28 ENCOUNTER — Encounter (HOSPITAL_COMMUNITY): Payer: Self-pay | Admitting: Gastroenterology

## 2015-11-18 ENCOUNTER — Telehealth: Payer: Self-pay | Admitting: Cardiology

## 2015-11-18 NOTE — Telephone Encounter (Signed)
Spoke with patient who states he received a message about scheduling a test; he thinks it was our office that called.  I advised that the only test order I see from our office is for a carotid duplex due 12/18.  I advised the follow-up for the aneurysm is due 5/18 by Dr. Vivi Martens office. He states he will have to recheck his message to find out who called.  He thanked me for the call.

## 2015-11-18 NOTE — Telephone Encounter (Signed)
Pt states he got a call to ask if he wanted to schedule  a procedure, and he does, asked if he knew what kind, he said it's for an aneurysm and he has it done every year, so I thought maybe a test in the office rather than a procedure, can we get an order and schedule @ [phone]

## 2015-12-01 ENCOUNTER — Ambulatory Visit: Payer: Medicare Other | Admitting: Cardiology

## 2015-12-01 DIAGNOSIS — N200 Calculus of kidney: Secondary | ICD-10-CM | POA: Diagnosis not present

## 2015-12-01 DIAGNOSIS — R351 Nocturia: Secondary | ICD-10-CM | POA: Diagnosis not present

## 2015-12-01 DIAGNOSIS — N281 Cyst of kidney, acquired: Secondary | ICD-10-CM | POA: Diagnosis not present

## 2015-12-01 DIAGNOSIS — N401 Enlarged prostate with lower urinary tract symptoms: Secondary | ICD-10-CM | POA: Diagnosis not present

## 2015-12-07 ENCOUNTER — Encounter: Payer: Self-pay | Admitting: Cardiology

## 2015-12-20 ENCOUNTER — Encounter: Payer: Self-pay | Admitting: Cardiology

## 2015-12-20 ENCOUNTER — Ambulatory Visit (INDEPENDENT_AMBULATORY_CARE_PROVIDER_SITE_OTHER): Payer: Medicare Other | Admitting: Cardiology

## 2015-12-20 VITALS — BP 120/70 | HR 76 | Ht 69.0 in | Wt 190.0 lb

## 2015-12-20 DIAGNOSIS — I779 Disorder of arteries and arterioles, unspecified: Secondary | ICD-10-CM

## 2015-12-20 DIAGNOSIS — I2510 Atherosclerotic heart disease of native coronary artery without angina pectoris: Secondary | ICD-10-CM | POA: Diagnosis not present

## 2015-12-20 DIAGNOSIS — I480 Paroxysmal atrial fibrillation: Secondary | ICD-10-CM

## 2015-12-20 DIAGNOSIS — I739 Peripheral vascular disease, unspecified: Secondary | ICD-10-CM

## 2015-12-20 DIAGNOSIS — I10 Essential (primary) hypertension: Secondary | ICD-10-CM

## 2015-12-20 DIAGNOSIS — E782 Mixed hyperlipidemia: Secondary | ICD-10-CM | POA: Diagnosis not present

## 2015-12-20 DIAGNOSIS — I77819 Aortic ectasia, unspecified site: Secondary | ICD-10-CM

## 2015-12-20 LAB — CBC WITH DIFFERENTIAL/PLATELET
BASOS PCT: 1 %
Basophils Absolute: 85 cells/uL (ref 0–200)
EOS ABS: 340 {cells}/uL (ref 15–500)
Eosinophils Relative: 4 %
HEMATOCRIT: 51.2 % — AB (ref 38.5–50.0)
Hemoglobin: 17.1 g/dL (ref 13.2–17.1)
LYMPHS PCT: 23 %
Lymphs Abs: 1955 cells/uL (ref 850–3900)
MCH: 31.7 pg (ref 27.0–33.0)
MCHC: 33.4 g/dL (ref 32.0–36.0)
MCV: 95 fL (ref 80.0–100.0)
MONO ABS: 935 {cells}/uL (ref 200–950)
MPV: 10.6 fL (ref 7.5–12.5)
Monocytes Relative: 11 %
NEUTROS PCT: 61 %
Neutro Abs: 5185 cells/uL (ref 1500–7800)
Platelets: 184 10*3/uL (ref 140–400)
RBC: 5.39 MIL/uL (ref 4.20–5.80)
RDW: 13.4 % (ref 11.0–15.0)
WBC: 8.5 10*3/uL (ref 3.8–10.8)

## 2015-12-20 LAB — BASIC METABOLIC PANEL
BUN: 18 mg/dL (ref 7–25)
CHLORIDE: 105 mmol/L (ref 98–110)
CO2: 24 mmol/L (ref 20–31)
Calcium: 9.2 mg/dL (ref 8.6–10.3)
Creat: 1.39 mg/dL — ABNORMAL HIGH (ref 0.70–1.18)
GLUCOSE: 111 mg/dL — AB (ref 65–99)
POTASSIUM: 4.6 mmol/L (ref 3.5–5.3)
Sodium: 139 mmol/L (ref 135–146)

## 2015-12-20 LAB — LIPID PANEL
Cholesterol: 146 mg/dL (ref <200)
HDL: 25 mg/dL — AB (ref >40)
LDL CALC: 83 mg/dL (ref <100)
Total CHOL/HDL Ratio: 5.8 Ratio — ABNORMAL HIGH (ref <5.0)
Triglycerides: 189 mg/dL — ABNORMAL HIGH (ref <150)
VLDL: 38 mg/dL — ABNORMAL HIGH (ref <30)

## 2015-12-20 LAB — HEPATIC FUNCTION PANEL
ALBUMIN: 4.5 g/dL (ref 3.6–5.1)
ALK PHOS: 65 U/L (ref 40–115)
ALT: 34 U/L (ref 9–46)
AST: 23 U/L (ref 10–35)
BILIRUBIN TOTAL: 1 mg/dL (ref 0.2–1.2)
Bilirubin, Direct: 0.2 mg/dL (ref <=0.2)
Indirect Bilirubin: 0.8 mg/dL (ref 0.2–1.2)
Total Protein: 7.1 g/dL (ref 6.1–8.1)

## 2015-12-20 MED ORDER — PRAVASTATIN SODIUM 20 MG PO TABS: 20.0000 mg | ORAL_TABLET | Freq: Every evening | ORAL | 11 refills | Status: DC

## 2015-12-20 MED ORDER — RIVAROXABAN 20 MG PO TABS: 20.0000 mg | ORAL_TABLET | Freq: Every day | ORAL | 11 refills | Status: DC

## 2015-12-20 NOTE — Addendum Note (Signed)
Addended by: Harland German A on: 12/20/2015 09:09 AM   Modules accepted: Orders

## 2015-12-20 NOTE — Patient Instructions (Addendum)
Medication Instructions:  1) STOP PLAVIX 2) START XARELTO 20 mg daily 3) STOP ASPIRIN  Labwork: TODAY: BMET, CBC, LFTs, Lipids  Testing/Procedures: Your physician has requested that you have an echocardiogram. Echocardiography is a painless test that uses sound waves to create images of your heart. It provides your doctor with information about the size and shape of your heart and how well your heart's chambers and valves are working. This procedure takes approximately one hour. There are no restrictions for this procedure.   [name] recommends you have a CHEST CT in MAY, 2018.  Your physician has requested that you have a carotid duplex in December, 2018. This test is an ultrasound of the carotid arteries in your neck. It looks at blood flow through these arteries that supply the brain with blood. Allow one hour for this exam. There are no restrictions or special instructions.  Follow-Up: Your physician recommends that you schedule a follow-up appointment in: 4 weeks with Dr. Theodosia Blender assistant.  Your physician wants you to follow-up in: 6 months with [name]. You will receive a reminder letter in the mail two months in advance. If you don't receive a letter, please call our office to schedule the follow-up appointment.   Any Other Special Instructions Will Be Listed Below (If Applicable).     If you need a refill on your cardiac medications before your next appointment, please call your pharmacy.

## 2015-12-20 NOTE — Progress Notes (Signed)
Cardiology Office Note    Date:  12/20/2015   ID:  Sean Willis, DOB 06-06-1945, MRN [id number]  PCP:  Sean Low, MD  Cardiologist:  Fransico Him, MD   Chief Complaint  Patient presents with  . Coronary Artery Disease  . Hypertension  . Hyperlipidemia    History of Present Illness:  Sean Willis is a 70 y.o. male with a history of ASCAD, HTN and dyslipidemia. He is doing well. He denies any chest pain, SOB, DOE, LE edema, dizziness, palpitations, claudication or syncope. He plays golf a few times weekly.    Past Medical History:  Diagnosis Date  . Acute MI 4/13   S/P CABG X4 Sx done in [place]  . Arrhythmia    Post op a-fib w episodes, several short burst of PAF at cardiac rehab-CHADS score is 1  . BPH (benign prostatic hyperplasia)   . Carotid artery stenosis    Left 15-49 %  . Chest wall pain    Right side, muscular,ortho,MRI. U/S abd ok. On lyrica  . Chronic insomnia   . Colon polyp 10/11  . Coronary artery disease 04/2011   s/p CABG in setting of MI in [place], [place], with initial PTCA of OM2 then CABG with LIMA to LAD, SVG to D1, SVG to OM1,/R1 and SVG to distal RCA  . DDD (degenerative disc disease), lumbar   . Dilatation of aorta (HCC)    4.5 cm ascending aortic aneurysm per chets ct 05-18-15 epic  . ED (erectile dysfunction)   . GERD (gastroesophageal reflux disease)   . Gout   . Hypertension    since mi in 2013 no htn  . Hypertriglyceridemia   . Kidney stone    uric acid, Dr [name]  renal insufficiency  . Osteoarthritis    of the knee    Past Surgical History:  Procedure Laterality Date  . CATARACT EXTRACTION W/PHACO Right 10/26/2014   Procedure: CATARACT EXTRACTION PHACO AND INTRAOCULAR LENS PLACEMENT (IOC);  Surgeon: [name], MD;  Location: [facility] ORS;  Service: Ophthalmology;  Laterality: Right;  LOT PACK: [id number] H US:01:43.0 AP:27.8 CDE:28.68  . COLONOSCOPY WITH PROPOFOL [name] 09/27/2015   Procedure: COLONOSCOPY WITH  PROPOFOL;  Surgeon: [name], MD;  Location: [facility];  Service: Endoscopy;  Laterality: [name];  . CORONARY ARTERY BYPASS GRAFT  04/2011   Severe multivessel ASCAD s/p Stemi w PTCA of OM2 then s/p CABG w LIMA to LAD,SVG to OM1 /RI and SVG to Distal RCA   . FINGER SURGERY     left finger for ganglion cyst  . KIDNEY STONE SURGERY      Current Medications: Outpatient Medications Prior to Visit  Medication Sig Dispense Refill  . aspirin (ASPIRIN EC) 81 MG EC tablet Take 81 mg by mouth daily. Swallow whole.    [name] aspirin 81 MG tablet Take 81 mg by mouth every evening.    . clopidogrel (PLAVIX) 75 MG tablet Take 75 mg by mouth daily.    [name] co-enzyme Q-10 30 MG capsule Take 300 mg by mouth daily.    . colchicine 0.6 MG tablet Take 0.6 mg by mouth daily as needed.    . doxepin (SINEQUAN) 25 MG capsule Take 25 mg by mouth at bedtime.    [name] esomeprazole (NEXIUM) 40 MG capsule Take 40 mg by mouth daily as needed.    . fish oil-omega-3 fatty acids 1000 MG capsule Take 4 g by mouth daily.    [name] HYDROcodone-acetaminophen (NORCO) 7.5-325 MG per tablet Take 1  tablet by mouth every 6 (six) hours as needed for moderate pain.    . indomethacin (INDOCIN) 50 MG capsule Take 50 mg by mouth 2 (two) times daily as needed (pain).     [name] loratadine (CLARITIN) 10 MG tablet Take 10 mg by mouth daily as needed.    . metoprolol tartrate (LOPRESSOR) 25 MG tablet Take 25 mg by mouth as directed. 1/2 TABLET TWICE DAILY    . pravastatin (PRAVACHOL) 80 MG tablet Take 20 mg by mouth every evening.     . vitamin B-12 (CYANOCOBALAMIN) 100 MCG tablet Take 100 mcg by mouth daily.    [name] zolpidem (AMBIEN) 10 MG tablet Take 5 mg by mouth at bedtime as needed for sleep.     [name] allopurinol (ZYLOPRIM) 300 MG tablet Take 300 mg by mouth daily.  1   No facility-administered medications prior to visit.      Allergies:   Celebrex [celecoxib]   Social History   Social History  . Marital status: Married    Spouse name: [name]  .  Number of children: [name]  . Years of education: [name]   Social History Main Topics  . Smoking status: Never Smoker  . Smokeless tobacco: Never Used  . Alcohol use Yes     Comment: yes occassionally  . Drug use: No  . Sexual activity: Not Asked   Other Topics Concern  . None   Social History Narrative  . None     Family History:  The patient's family history includes Alzheimer's disease in his mother; Arrhythmia in his father and sister; CVA in his father; Cancer in his mother.   ROS:   Please see the history of present illness.    ROS All other systems reviewed and are negative.  No flowsheet data found.     PHYSICAL EXAM:   VS:  BP 120/70   Pulse 76   Ht 5\' 9"  (1.753 m)   Wt 190 lb (86.2 kg)   BMI 28.06 kg/m    GEN: Well nourished, well developed, in no acute distress  HEENT: normal  Neck: no JVD, carotid bruits, or masses Cardiac: irregularly irregular; no murmurs, rubs, or gallops,no edema.  Intact distal pulses bilaterally.  Respiratory:  clear to auscultation bilaterally, normal work of breathing GI: soft, nontender, nondistended, + BS MS: no deformity or atrophy  Skin: warm and dry, no rash Neuro:  Alert and Oriented x 3, Strength and sensation are intact Psych: euthymic mood, full affect  Wt Readings from Last 3 Encounters:  12/20/15 190 lb (86.2 kg)  09/27/15 186 lb (84.4 kg)  05/18/15 186 lb (84.4 kg)      Studies/Labs Reviewed:   EKG:  EKG is ordered today.  The ekg ordered today demonstrates atrial fibrillation with CVR and no ST changes  Recent Labs: No results found for requested labs within last 8760 hours.   Lipid Panel    Component Value Date/Time   CHOL 117 (L) 11/24/2014 0930   CHOL 123 01/14/2013 0923   TRIG 100 11/24/2014 0930   TRIG 141 01/14/2013 0923   HDL 33 (L) 11/24/2014 0930   HDL 34 (L) 01/14/2013 0923   CHOLHDL 3.5 11/24/2014 0930   VLDL 20 11/24/2014 0930   LDLCALC 64 11/24/2014 0930   LDLCALC 61 01/14/2013 0923     Additional studies/ records that were reviewed today include:  none    ASSESSMENT:    1. Coronary artery disease involving native coronary artery of native heart without  angina pectoris   2. Dilatation of aorta (HCC)   3. Benign essential HTN   4. Bilateral carotid artery disease ([place])   5. Mixed hyperlipidemia   6. Paroxysmal atrial fibrillation (HCC)      PLAN:  In order of problems listed above:  1. ASCAD - s/p CABG in setting of MI in [place], [place], with initial PTCA of OM2 then CABG with LIMA to LAD, SVG to D1, SVG to OM1,/R1 and SVG to distal RCA.  He has not had any anginal symptoms.  Continue ASA/statin and BB.   Stop plavix as he is starting NOAC. 2. Dilated aortic root - 58mm by recent chest CT.  Repeat chest CT 05/2015.  Continue BB and statin.  3. HTN - BP controlled on current meds.  Continue BB 4. Bilateral carotid artery disease - continue ASA and statin.  Repeat dopplers in 1 year. 5. Hyperlipidemia - LDL goal < 70.  Continue statin.  Check FLP and ALT.  6.   New onset atrial fibrillation of unknown duration.  His CHADS2VASC score is 3.  Will start Xarelto 20mg  daily.  We will stop his plavix since he was on this after CABG and has not had any PCI.  Check NOAC panel. Check 2D echo to assess LA size and LVF.  I will have him see the PA back in 4 weeks and if still in afib will set up DCCV.  I instructed him to take his Xarelto with his largest meal of the day.   Medication Adjustments/Labs and Tests Ordered: Current medicines are reviewed at length with the patient today.  Concerns regarding medicines are outlined above.  Medication changes, Labs and Tests ordered today are listed in the Patient Instructions below.  There are no Patient Instructions on file for this visit.   Signed, Fransico Him, MD  12/20/2015 8:32 AM    [address], [place], [place]  [address] Phone: [phone]; Fax: [phone]

## 2015-12-22 ENCOUNTER — Ambulatory Visit (HOSPITAL_COMMUNITY): Payer: Medicare Other | Attending: Internal Medicine

## 2015-12-22 ENCOUNTER — Other Ambulatory Visit: Payer: Self-pay

## 2015-12-22 ENCOUNTER — Telehealth: Payer: Self-pay

## 2015-12-22 DIAGNOSIS — I2510 Atherosclerotic heart disease of native coronary artery without angina pectoris: Secondary | ICD-10-CM | POA: Insufficient documentation

## 2015-12-22 DIAGNOSIS — I712 Thoracic aortic aneurysm, without rupture: Secondary | ICD-10-CM | POA: Insufficient documentation

## 2015-12-22 DIAGNOSIS — I480 Paroxysmal atrial fibrillation: Secondary | ICD-10-CM | POA: Diagnosis not present

## 2015-12-22 DIAGNOSIS — I10 Essential (primary) hypertension: Secondary | ICD-10-CM | POA: Insufficient documentation

## 2015-12-22 DIAGNOSIS — I77819 Aortic ectasia, unspecified site: Secondary | ICD-10-CM

## 2015-12-22 DIAGNOSIS — I252 Old myocardial infarction: Secondary | ICD-10-CM | POA: Diagnosis not present

## 2015-12-22 DIAGNOSIS — Z951 Presence of aortocoronary bypass graft: Secondary | ICD-10-CM | POA: Diagnosis not present

## 2015-12-22 DIAGNOSIS — I071 Rheumatic tricuspid insufficiency: Secondary | ICD-10-CM | POA: Diagnosis not present

## 2015-12-22 NOTE — Telephone Encounter (Signed)
Received walk-in form to call patient regarding some meds he was started on.  Patient has reservations about starting Xarelto.  Discussed in great detail risks and benefits of the medication and NOT being on the medication at all.  He is also nervous about the price. Instructed him to start his samples and to use 30 day card. He understands he will be called next week with further recommendations from [name].

## 2015-12-22 NOTE — Telephone Encounter (Signed)
Have him check dosing of eliquis and xarelto with the insurance company

## 2015-12-28 NOTE — Telephone Encounter (Signed)
-----   Message from Sueanne Margarita, MD sent at 12/22/2015  9:35 PM EST ----- Please let patient know that echo showed normal LVF with moderate LVH, , trivial AR, moderately dilated aortic root at 3mm, moderate MR, ? PFO.  The ascending aorta has increased by 0.4cm in 2 years. Forward to Dr. [name] for followup.  Repeat echo in 1 year

## 2015-12-28 NOTE — Telephone Encounter (Signed)
Informed patient of results and verbal understanding expressed.  Repeat ECHO ordered to be scheduled in 1 year. Forwarded to Dr. [name] for review. Patient agrees with treatment plan.

## 2016-01-17 ENCOUNTER — Ambulatory Visit (INDEPENDENT_AMBULATORY_CARE_PROVIDER_SITE_OTHER): Payer: Medicare Other | Admitting: Physician Assistant

## 2016-01-17 ENCOUNTER — Ambulatory Visit
Admission: RE | Admit: 2016-01-17 | Discharge: 2016-01-17 | Disposition: A | Discharge status: Home / Self Care | Payer: Medicare Other | Source: Ambulatory Visit | Attending: Physician Assistant | Admitting: Physician Assistant

## 2016-01-17 ENCOUNTER — Encounter: Payer: Self-pay | Admitting: Physician Assistant

## 2016-01-17 VITALS — BP 118/84 | HR 99 | Ht 69.0 in | Wt 190.0 lb

## 2016-01-17 DIAGNOSIS — I77819 Aortic ectasia, unspecified site: Secondary | ICD-10-CM | POA: Diagnosis not present

## 2016-01-17 DIAGNOSIS — I2510 Atherosclerotic heart disease of native coronary artery without angina pectoris: Secondary | ICD-10-CM | POA: Diagnosis not present

## 2016-01-17 DIAGNOSIS — R918 Other nonspecific abnormal finding of lung field: Secondary | ICD-10-CM | POA: Diagnosis not present

## 2016-01-17 DIAGNOSIS — Z01818 Encounter for other preprocedural examination: Secondary | ICD-10-CM

## 2016-01-17 DIAGNOSIS — I10 Essential (primary) hypertension: Secondary | ICD-10-CM

## 2016-01-17 DIAGNOSIS — I480 Paroxysmal atrial fibrillation: Secondary | ICD-10-CM | POA: Diagnosis not present

## 2016-01-17 IMAGING — DX DG CHEST 2V
2 series · 2 of 2 positions shown · non-contrast
Comparison: CT chest [DATE]

CLINICAL DATA: Pre testing for cardioversion.

EXAM:
CHEST  2 VIEW

[dg chest 2 view (1 of 2)]
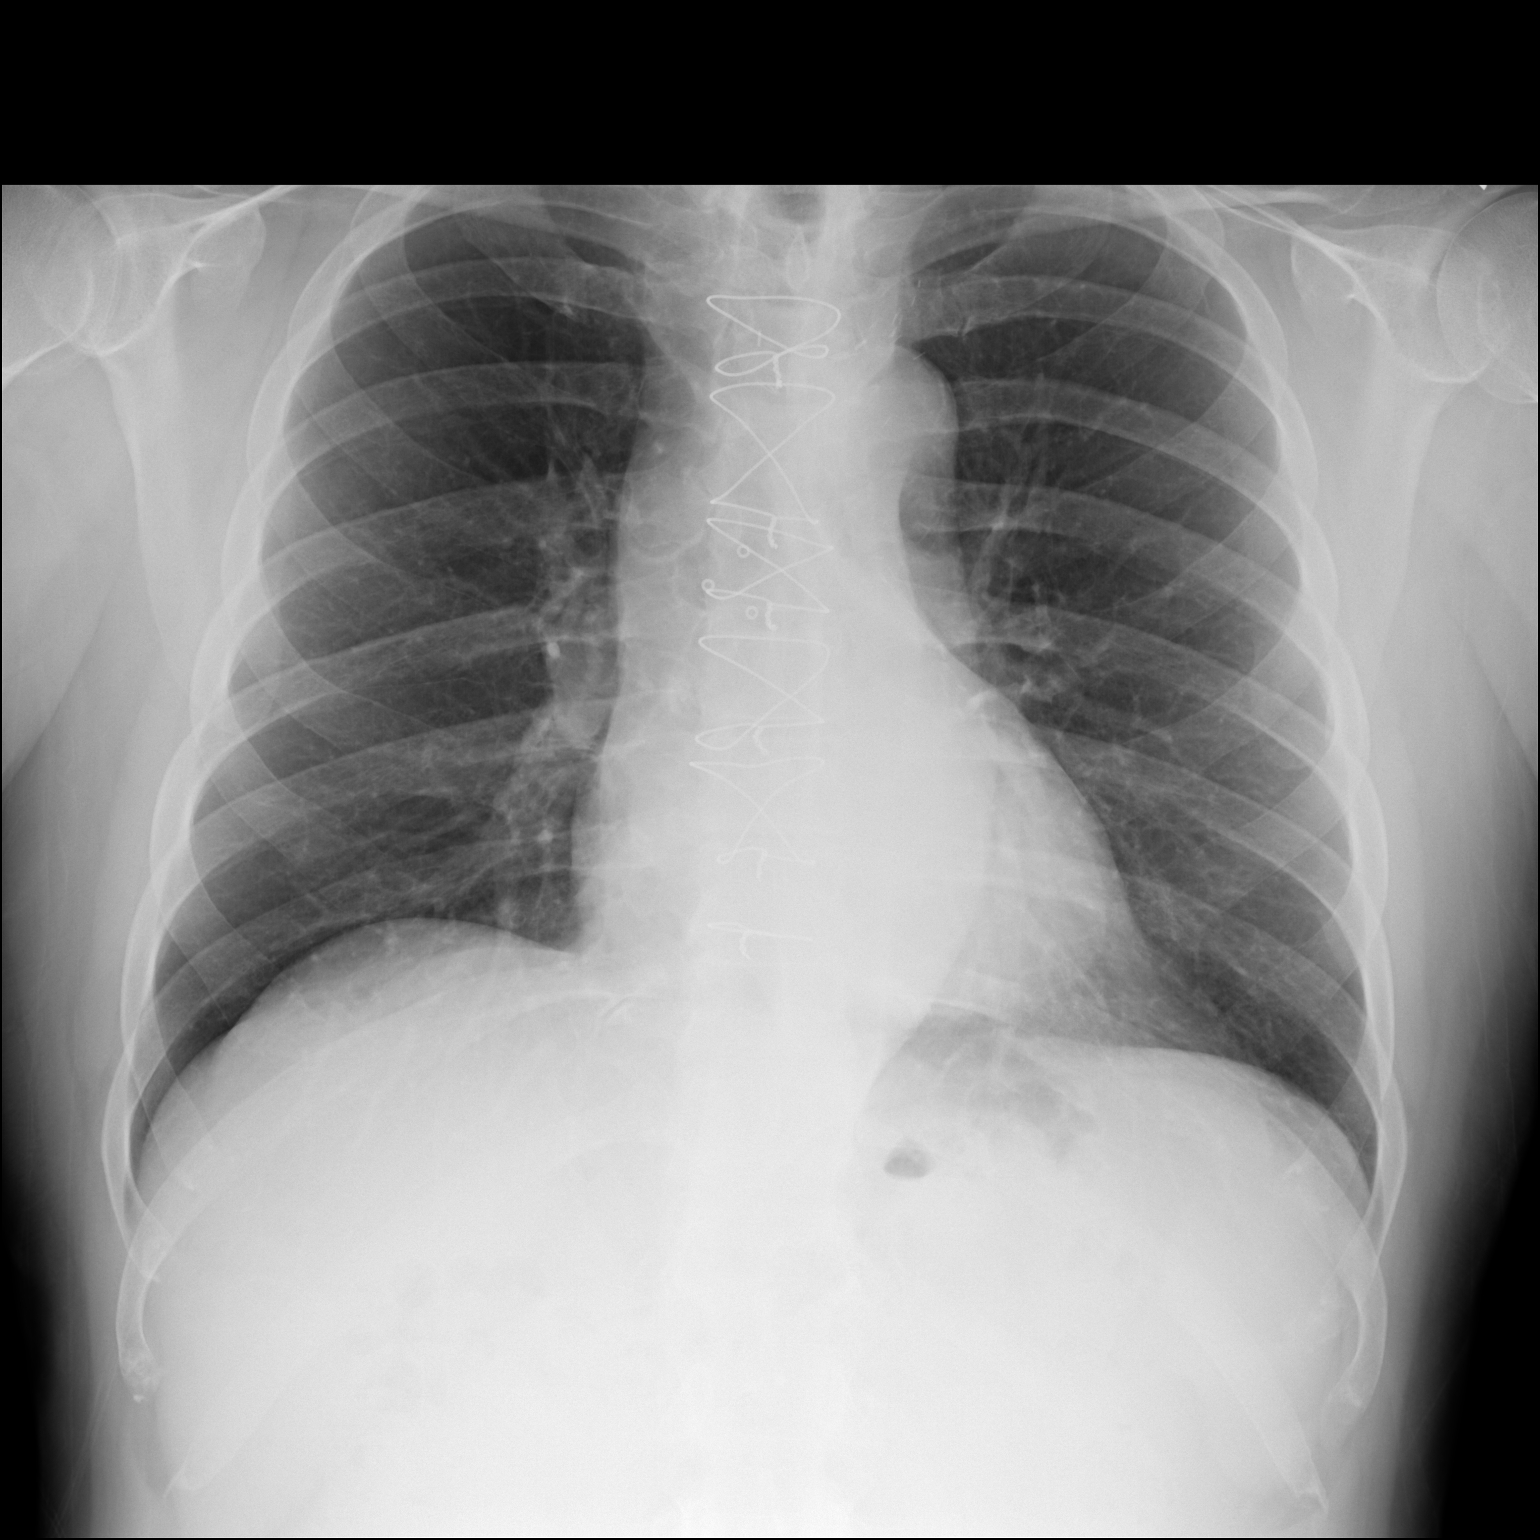

[dg chest 2 view (2 of 2)]
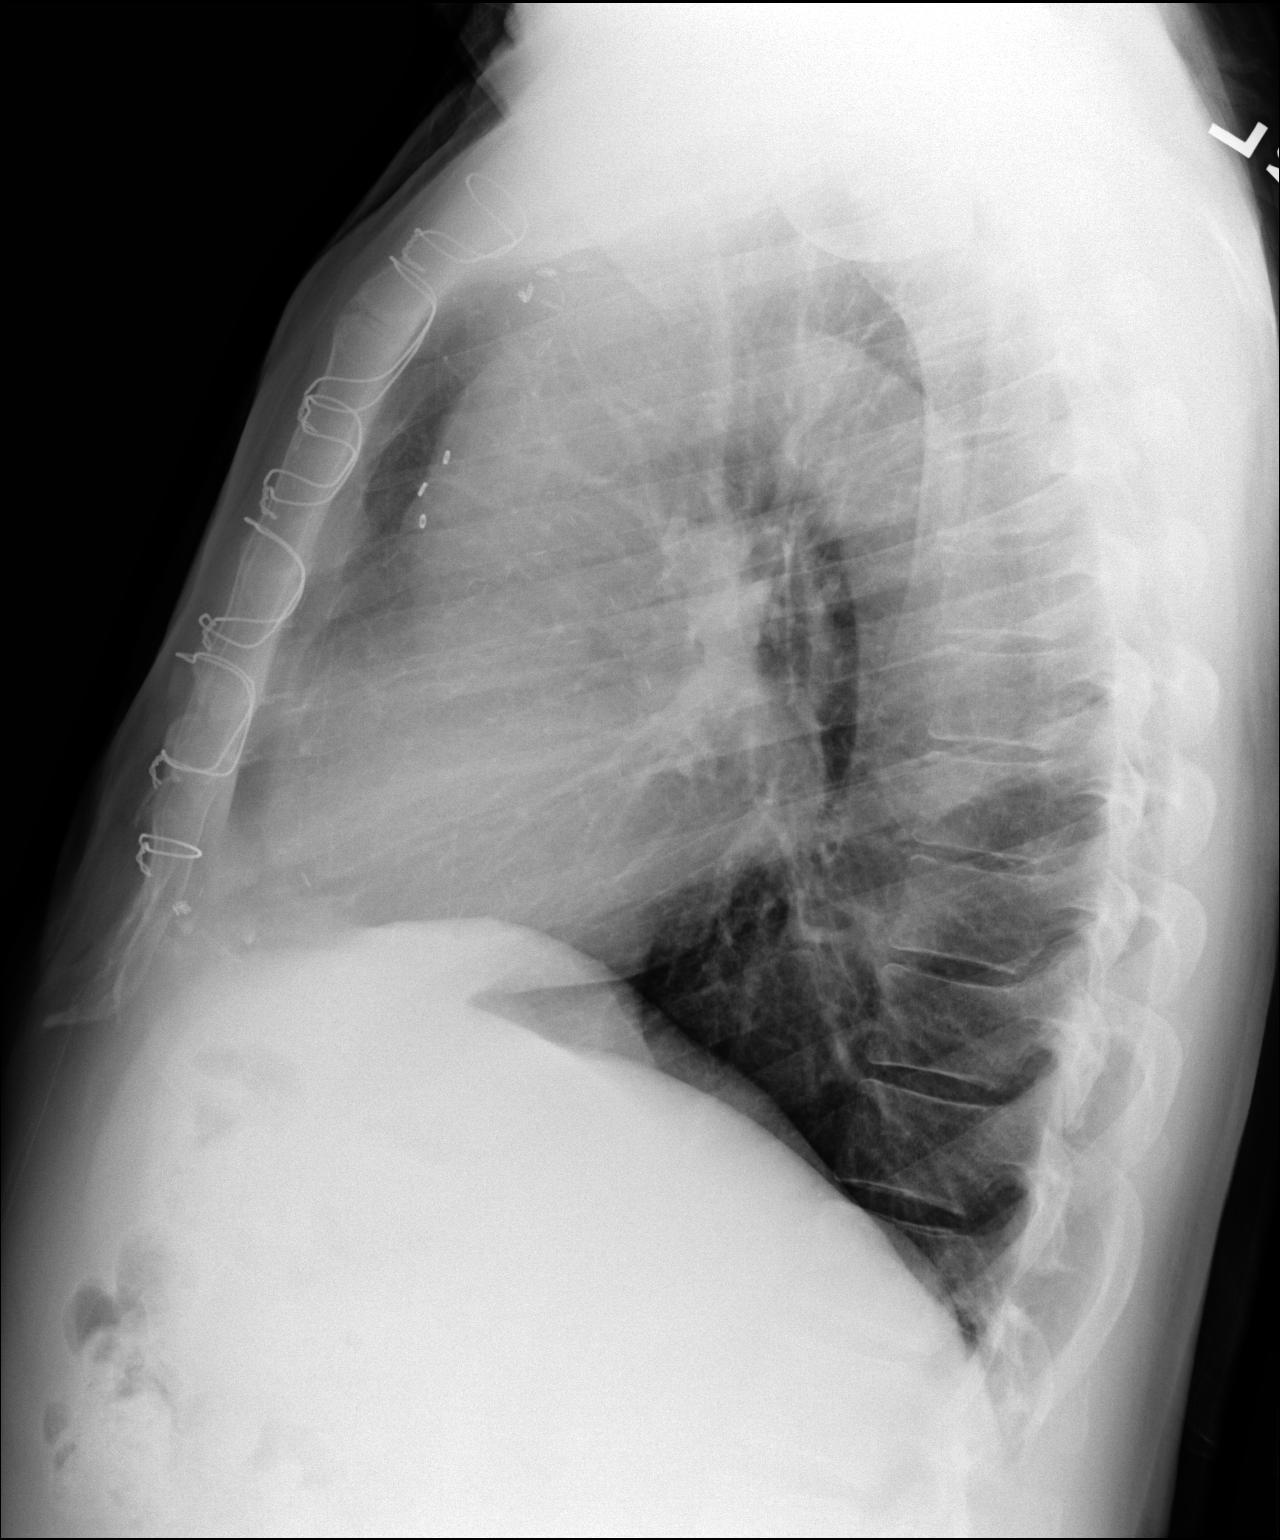

[2 of 2 positions shown; findings below may reference images not displayed]

FINDINGS: There is no focal parenchymal opacity. There is no pleural effusion
or pneumothorax. The heart and mediastinal contours are
unremarkable. There is evidence of prior CABG. There is mild
thoracic aortic atherosclerosis.

The osseous structures are unremarkable.
IMPRESSION: No active cardiopulmonary disease.

## 2016-01-17 NOTE — Patient Instructions (Addendum)
Your physician has recommended that you have a Cardioversion (DCCV). Electrical Cardioversion uses a jolt of electricity to your heart either through paddles or wired patches attached to your chest. This is a controlled, usually prescheduled, procedure. Defibrillation is done under light anesthesia in the hospital, and you usually go home the day of the procedure. This is done to get your heart back into a normal rhythm. You are not awake for the procedure. Please see the instruction sheet given to you today. This will be done on Friday 01/20/16 by Dr Radford Pax.  Your physician recommends that you return for lab TODAY.  A chest x-ray takes a picture of the organs and structures inside the chest, including the heart, lungs, and blood vessels. This test can show several things, including, whether the heart is enlarges; whether fluid is building up in the lungs; and whether pacemaker / defibrillator leads are still in place. THIS WILL BE ONE AT [facility] [address].  Your physician recommends that you schedule a follow-up appointment in: 2 weeks with Estella Husk.

## 2016-01-17 NOTE — Progress Notes (Signed)
Cardiology Office Note    Date:  01/17/2016   ID:  Sean Willis, DOB [birth year], MRN [id number]  PCP:  [name], MD  Cardiologist: [name]  Chief Complaint  Patient presents with  . Atrial Fibrillation    History of Present Illness:  Sean Willis is a 71 y.o. male with history of CAD status post CABG 4 2013 in setting of [name] with initial PTCA of the OM 2 and CABG with LIMA to the LAD, SVG to diagonal 1, SVG to OM1/R1 and SVG to distal RCA. Also has hypertension and dyslipidemia. 4.5 cm ascending aortic aneurysm on CT 05/18/15.  Patient saw [name] 12/20/15 at had new onset A. fib unknown duration.CHADSVASC=3. Plavix was stopped and he was started on Xarelto 20 mg daily. 2-D echo was ordered and brought back today. 2-D echo showed normal LVEF with moderate LVH, trivial AI, moderately dilated aortic root at 46 mm and moderate MR. Question of PFO. The ascending aorta has increased by 0.4 cm in 2 years. Echo sent to Dr. [name] for review. Left atrium was moderately dilated.  Patient comes in today with a list of his blood pressures and heart rates. He says his heart rate is 20 beats higher than usual. It's been in the 90s but if he does any exercise it gets up to 150 very quickly. His blood pressure is borderline low so we can increase his metoprolol. He is also has soreness in his chest that he marked with a marker. He denies any chest tightness or pressure, dyspnea, dizziness or presyncope. He thought it was coming from the [name].    Past Medical History:  Diagnosis Date  . Acute MI 4/13   S/P CABG X4 Sx done in [place]  . Arrhythmia    Post op a-fib w episodes, several short burst of PAF at cardiac rehab-CHADS score is 1  . BPH (benign prostatic hyperplasia)   . Carotid artery stenosis    Left 15-49 %  . Chest wall pain    Right side, muscular,ortho,MRI. U/S abd ok. On lyrica  . Chronic insomnia   . Colon polyp 10/11  . Coronary  artery disease 04/2011   s/p CABG in setting of MI in [place], [place], with initial PTCA of OM2 then CABG with LIMA to LAD, SVG to D1, SVG to OM1,/R1 and SVG to distal RCA  . DDD (degenerative disc disease), lumbar   . Dilatation of aorta (HCC)    4.5 cm ascending aortic aneurysm per chets ct 05-18-15 epic  . ED (erectile dysfunction)   . GERD (gastroesophageal reflux disease)   . Gout   . Hypertension    since mi in 2013 no htn  . Hypertriglyceridemia   . Kidney stone    uric acid, Dr [name]  renal insufficiency  . Osteoarthritis    of the knee    Past Surgical History:  Procedure Laterality Date  . CATARACT EXTRACTION W/PHACO Right 10/26/2014   Procedure: CATARACT EXTRACTION PHACO AND INTRAOCULAR LENS PLACEMENT (IOC);  Surgeon: [name], MD;  Location: [facility] ORS;  Service: Ophthalmology;  Laterality: Right;  LOT PACK: [id number] H US:01:43.0 AP:27.8 CDE:28.68  . COLONOSCOPY WITH PROPOFOL [name] 09/27/2015   Procedure: COLONOSCOPY WITH PROPOFOL;  Surgeon: [name], MD;  Location: [facility];  Service: Endoscopy;  Laterality: [name];  . CORONARY ARTERY BYPASS GRAFT  04/2011   Severe multivessel ASCAD s/p Stemi w PTCA of OM2 then s/p CABG w LIMA to LAD,SVG to OM1 /RI  and SVG to Distal RCA   . FINGER SURGERY     left finger for ganglion cyst  . KIDNEY STONE SURGERY      Current Medications: Outpatient Medications Prior to Visit  Medication Sig Dispense Refill  . co-enzyme Q-10 30 MG capsule Take 300 mg by mouth daily.    . colchicine 0.6 MG tablet Take 0.6 mg by mouth daily as needed.    . doxepin (SINEQUAN) 25 MG capsule Take 25 mg by mouth at bedtime.    [name] esomeprazole (NEXIUM) 40 MG capsule Take 40 mg by mouth daily as needed.    . fish oil-omega-3 fatty acids 1000 MG capsule Take 4 g by mouth daily.    [name] HYDROcodone-acetaminophen (NORCO) 7.5-325 MG per tablet Take 1 tablet by mouth every 6 (six) hours as needed for moderate pain.    . indomethacin (INDOCIN) 50 MG  capsule Take 50 mg by mouth 2 (two) times daily as needed (pain).     [name] loratadine (CLARITIN) 10 MG tablet Take 10 mg by mouth daily as needed.    . metoprolol tartrate (LOPRESSOR) 25 MG tablet Take 25 mg by mouth as directed. 1/2 TABLET TWICE DAILY    . pravastatin (PRAVACHOL) 20 MG tablet Take 1 tablet (20 mg total) by mouth every evening. 30 tablet 11  . rivaroxaban (XARELTO) 20 MG TABS tablet Take 1 tablet (20 mg total) by mouth daily with supper. 30 tablet 11  . vitamin B-12 (CYANOCOBALAMIN) 100 MCG tablet Take 100 mcg by mouth daily.    [name] zolpidem (AMBIEN) 10 MG tablet Take 5 mg by mouth at bedtime as needed for sleep.      No facility-administered medications prior to visit.      Allergies:   Celebrex [celecoxib]   Social History   Social History  . Marital status: Married    Spouse name: [name]  . Number of children: [name]  . Years of education: [name]   Social History Main Topics  . Smoking status: Never Smoker  . Smokeless tobacco: Never Used  . Alcohol use Yes     Comment: yes occassionally  . Drug use: No  . Sexual activity: Not Asked   Other Topics Concern  . None   Social History Narrative  . None     Family History:  The patient's   family history includes Alzheimer's disease in his mother; Arrhythmia in his father and sister; CVA in his father; Cancer in his mother.   ROS:   Please see the history of present illness.    Review of Systems  Constitution: Negative.  HENT: Negative.   Cardiovascular: Positive for chest pain and irregular heartbeat.  Respiratory: Negative.   Endocrine: Negative.   Hematologic/Lymphatic: Negative.   Musculoskeletal: Negative.   Gastrointestinal: Negative.   Genitourinary: Negative.   Neurological: Negative.    All other systems reviewed and are negative.   PHYSICAL EXAM:   VS:  BP 118/84   Pulse 99   Ht 5\' 9"  (1.753 m)   Wt 190 lb (86.2 kg)   BMI 28.06 kg/m   Physical Exam  GEN: Well nourished, well developed, in no  acute distress  HEENT: normal  Neck: no JVD, carotid bruits, or masses Cardiac:RRR; no murmurs, rubs, or gallops  Respiratory:  clear to auscultation bilaterally, normal work of breathing GI: soft, nontender, nondistended, + BS Ext: without cyanosis, clubbing, or edema, Good distal pulses bilaterally MS: no deformity or atrophy  Skin: warm and dry, no rash  Neuro:  Alert and Oriented x 3, Strength and sensation are intact Psych: euthymic mood, full affect  Wt Readings from Last 3 Encounters:  01/17/16 190 lb (86.2 kg)  12/20/15 190 lb (86.2 kg)  09/27/15 186 lb (84.4 kg)      Studies/Labs Reviewed:   EKG:  EKG is  ordered today.  The ekg ordered today demonstrates A. fib at 94 bpm  Recent Labs: 12/20/2015: ALT 34; BUN 18; Creat 1.39; Hemoglobin 17.1; Platelets 184; Potassium 4.6; Sodium 139   Lipid Panel    Component Value Date/Time   CHOL 146 12/20/2015 0914   CHOL 123 01/14/2013 0923   TRIG 189 (H) 12/20/2015 0914   TRIG 141 01/14/2013 0923   HDL 25 (L) 12/20/2015 0914   HDL 34 (L) 01/14/2013 0923   CHOLHDL 5.8 (H) 12/20/2015 0914   VLDL 38 (H) 12/20/2015 0914   LDLCALC 83 12/20/2015 0914   LDLCALC 61 01/14/2013 0923    Additional studies/ records that were reviewed today include:   2-D echo 12/22/15 Study Conclusions   - Left ventricle: The cavity size was normal. Wall thickness was   increased in a pattern of moderate LVH. Systolic function was   normal. The estimated ejection fraction was in the range of 55%   to 60%. Wall motion was normal; there were no regional wall   motion abnormalities. The study is not technically sufficient to   allow evaluation of LV diastolic function. - Aortic valve: Trileaflet. Sclerosis without stenosis. There was   trivial regurgitation. - Aorta: Ascending aortic diameter: 46 mm (S). - Ascending aorta: The ascending aorta was moderately dilated. - Mitral valve: Mildly thickened leaflets . There was moderate    regurgitation. - Left atrium: Moderately dilated. - Atrial septum: Mobile IAS. Small PFO cannot be excluded. - Tricuspid valve: There was trivial regurgitation. - Pulmonary arteries: PA peak pressure: 23 mm Hg (S). - Inferior vena cava: The vessel was normal in size. The   respirophasic diameter changes were in the normal range (>= 50%),   consistent with normal central venous pressure.   Impressions:   - Compared to a prior study in 2015, the ascending aorta now   measures 4.6 cm (up from 4.2 cm). LVEF is unchanged at 55-60%.     ASSESSMENT:    1. Paroxysmal atrial fibrillation (HCC)   2. Coronary artery disease involving native coronary artery of native heart without angina pectoris   3. Dilatation of aorta (HCC)   4. Benign essential HTN   5. Preop testing      PLAN:  In order of problems listed above:  A. fib for the past month. CHADSVASC=3. [name] recommends cardioversion. Patient has been on Xarelto 20 mg daily for the past month and has not missed a dose. We'll schedule cardioversion.Risk and benefits explained to patient who is agreeable.  CAD status post CABG in 2013 in the setting of an MI and PTCA. Patient is having some chest soreness but no angina-like he had in the past. Continue to monitor.  Dilated aorta on 2-D echo. Sent to Dr. [name] for review.  Benign essential hypertension blood pressure on the low side so can't increase metoprolol.  Preop testing chest x-ray and labs will be done today for cardioversion Friday.    Medication Adjustments/Labs and Tests Ordered: Current medicines are reviewed at length with the patient today.  Concerns regarding medicines are outlined above.  Medication changes, Labs and Tests ordered today are listed in the Patient Instructions  below. Patient Instructions  Your physician has recommended that you have a Cardioversion (DCCV). Electrical Cardioversion uses a jolt of electricity to your heart either through paddles or  wired patches attached to your chest. This is a controlled, usually prescheduled, procedure. Defibrillation is done under light anesthesia in the hospital, and you usually go home the day of the procedure. This is done to get your heart back into a normal rhythm. You are not awake for the procedure. Please see the instruction sheet given to you today. This will be done on Friday 01/20/16 by Dr Radford Pax.  Your physician recommends that you return for lab TODAY.  A chest x-ray takes a picture of the organs and structures inside the chest, including the heart, lungs, and blood vessels. This test can show several things, including, whether the heart is enlarges; whether fluid is building up in the lungs; and whether pacemaker / defibrillator leads are still in place. THIS WILL BE ONE AT [facility] [address].  Your physician recommends that you schedule a follow-up appointment in: 2 weeks with Estella Husk.      Sumner Boast, PA-C  01/17/2016 8:36 AM    [facility] [place], [place], [place]  [address] Phone: [phone]; Fax: [phone]

## 2016-01-17 NOTE — Addendum Note (Signed)
Addended by: Domenica Reamer R on: 01/17/2016 12:14 PM   Modules accepted: Orders

## 2016-01-17 NOTE — Addendum Note (Signed)
Addended by: Domenica Reamer R on: 01/17/2016 12:21 PM   Modules accepted: Orders

## 2016-01-18 LAB — APTT: aPTT: 32 s (ref 24–33)

## 2016-01-18 LAB — BASIC METABOLIC PANEL
BUN / CREAT RATIO: 14 (ref 10–24)
BUN: 17 mg/dL (ref 8–27)
CO2: 23 mmol/L (ref 18–29)
CREATININE: 1.2 mg/dL (ref 0.76–1.27)
Calcium: 9.4 mg/dL (ref 8.6–10.2)
Chloride: 102 mmol/L (ref 96–106)
GFR calc non Af Amer: 61 mL/min/{1.73_m2} (ref >59)
GFR, EST AFRICAN AMERICAN: 70 mL/min/{1.73_m2} (ref >59)
Glucose: 106 mg/dL — ABNORMAL HIGH (ref 65–99)
Potassium: 4.5 mmol/L (ref 3.5–5.2)
SODIUM: 141 mmol/L (ref 134–144)

## 2016-01-18 LAB — CBC
HEMATOCRIT: 48.5 % (ref 37.5–51.0)
Hemoglobin: 16.9 g/dL (ref 13.0–17.7)
MCH: 32.3 pg (ref 26.6–33.0)
MCHC: 34.8 g/dL (ref 31.5–35.7)
MCV: 93 fL (ref 79–97)
PLATELETS: 172 10*3/uL (ref 150–379)
RBC: 5.24 x10E6/uL (ref 4.14–5.80)
RDW: 13.4 % (ref 12.3–15.4)
WBC: 8 10*3/uL (ref 3.4–10.8)

## 2016-01-18 LAB — PROTIME-INR
INR: 1.2 (ref 0.8–1.2)
Prothrombin Time: 12.8 s — ABNORMAL HIGH (ref 9.1–12.0)

## 2016-01-20 ENCOUNTER — Telehealth: Payer: Self-pay | Admitting: Physician Assistant

## 2016-01-20 ENCOUNTER — Ambulatory Visit (HOSPITAL_COMMUNITY): Payer: Medicare Other | Admitting: Certified Registered Nurse Anesthetist

## 2016-01-20 ENCOUNTER — Encounter (HOSPITAL_COMMUNITY)
Admission: RE | Disposition: A | Discharge status: Home / Self Care | Payer: Self-pay | Source: Ambulatory Visit | Attending: Cardiology

## 2016-01-20 ENCOUNTER — Ambulatory Visit (HOSPITAL_COMMUNITY)
Admission: RE | Admit: 2016-01-20 | Discharge: 2016-01-20 | Disposition: A | Discharge status: Home / Self Care | Payer: Medicare Other | Source: Ambulatory Visit | Attending: Cardiology | Admitting: Cardiology

## 2016-01-20 ENCOUNTER — Encounter (HOSPITAL_COMMUNITY): Payer: Self-pay | Admitting: *Deleted

## 2016-01-20 DIAGNOSIS — M1990 Unspecified osteoarthritis, unspecified site: Secondary | ICD-10-CM | POA: Diagnosis not present

## 2016-01-20 DIAGNOSIS — Z951 Presence of aortocoronary bypass graft: Secondary | ICD-10-CM | POA: Insufficient documentation

## 2016-01-20 DIAGNOSIS — E781 Pure hyperglyceridemia: Secondary | ICD-10-CM | POA: Insufficient documentation

## 2016-01-20 DIAGNOSIS — I4891 Unspecified atrial fibrillation: Secondary | ICD-10-CM | POA: Insufficient documentation

## 2016-01-20 DIAGNOSIS — N400 Enlarged prostate without lower urinary tract symptoms: Secondary | ICD-10-CM | POA: Diagnosis not present

## 2016-01-20 DIAGNOSIS — I252 Old myocardial infarction: Secondary | ICD-10-CM | POA: Insufficient documentation

## 2016-01-20 DIAGNOSIS — K219 Gastro-esophageal reflux disease without esophagitis: Secondary | ICD-10-CM | POA: Insufficient documentation

## 2016-01-20 DIAGNOSIS — M109 Gout, unspecified: Secondary | ICD-10-CM | POA: Insufficient documentation

## 2016-01-20 DIAGNOSIS — Z79899 Other long term (current) drug therapy: Secondary | ICD-10-CM | POA: Diagnosis not present

## 2016-01-20 DIAGNOSIS — I10 Essential (primary) hypertension: Secondary | ICD-10-CM | POA: Insufficient documentation

## 2016-01-20 DIAGNOSIS — M5136 Other intervertebral disc degeneration, lumbar region: Secondary | ICD-10-CM | POA: Insufficient documentation

## 2016-01-20 DIAGNOSIS — I2510 Atherosclerotic heart disease of native coronary artery without angina pectoris: Secondary | ICD-10-CM | POA: Diagnosis not present

## 2016-01-20 DIAGNOSIS — I480 Paroxysmal atrial fibrillation: Secondary | ICD-10-CM

## 2016-01-20 DIAGNOSIS — I4819 Other persistent atrial fibrillation: Secondary | ICD-10-CM

## 2016-01-20 HISTORY — PX: CARDIOVERSION: SHX1299

## 2016-01-20 SURGERY — CARDIOVERSION
Anesthesia: General

## 2016-01-20 MED ORDER — SODIUM CHLORIDE 0.9 % IV SOLN
250.0000 mL | INTRAVENOUS | Status: DC
  Administered 2016-01-20: 250 mL via INTRAVENOUS

## 2016-01-20 MED ORDER — SODIUM CHLORIDE 0.9% FLUSH: 3.0000 mL | Freq: Two times a day (BID) | INTRAVENOUS | Status: DC

## 2016-01-20 MED ORDER — PROPOFOL 10 MG/ML IV BOLUS
INTRAVENOUS | Status: DC | PRN
  Administered 2016-01-20: 70 mg via INTRAVENOUS

## 2016-01-20 MED ORDER — LIDOCAINE 2% (20 MG/ML) 5 ML SYRINGE
INTRAMUSCULAR | Status: DC | PRN
  Administered 2016-01-20: 80 mg via INTRAVENOUS

## 2016-01-20 MED ORDER — SODIUM CHLORIDE 0.9 % IV SOLN: 250.0000 mL | INTRAVENOUS | Status: DC

## 2016-01-20 MED ORDER — SODIUM CHLORIDE 0.9% FLUSH: 3.0000 mL | INTRAVENOUS | Status: DC | PRN

## 2016-01-20 NOTE — Telephone Encounter (Signed)
Follow Up:; ° ° °Returning your call. °

## 2016-01-20 NOTE — Anesthesia Postprocedure Evaluation (Signed)
Anesthesia Post Note  Patient: Sean Willis  Procedure(s) Performed: Procedure(s) (LRB): CARDIOVERSION ([name])  Patient location during evaluation: Endoscopy Anesthesia Type: General Level of consciousness: awake and alert Pain management: pain level controlled Vital Signs Assessment: post-procedure vital signs reviewed and stable Respiratory status: spontaneous breathing, nonlabored ventilation, respiratory function stable and patient connected to nasal cannula oxygen Cardiovascular status: blood pressure returned to baseline and stable Postop Assessment: no signs of nausea or vomiting Anesthetic complications: no       Last Vitals:  Vitals:   01/20/16 1255 01/20/16 1305  BP: (!) 127/93 (!) 126/98  Pulse: 66 69  Resp: (!) 21 13    Last Pain:  Vitals:   01/20/16 1245  TempSrc: Oral                 Montez Hageman

## 2016-01-20 NOTE — Telephone Encounter (Signed)
PT AWARE OF LAB RESULTS./CY 

## 2016-01-20 NOTE — Transfer of Care (Signed)
Immediate Anesthesia Transfer of Care Note  Patient: Sean Willis  Procedure(s) Performed: Procedure(s): CARDIOVERSION ([name])  Patient Location: Endoscopy Unit  Anesthesia Type:MAC  Level of Consciousness: awake, alert , oriented and patient cooperative  Airway & Oxygen Therapy: Patient Spontanous Breathing  Post-op Assessment: Report given to RN and Post -op Vital signs reviewed and stable  Post vital signs: Reviewed and stable  Last Vitals:  Vitals:   01/20/16 1241 01/20/16 1242  BP:    Pulse: 74 67  Resp: 12 15    Last Pain:  Vitals:   01/20/16 1148  TempSrc: Oral         Complications: No apparent anesthesia complications

## 2016-01-20 NOTE — H&P (View-Only) (Signed)
Cardiology Office Note    Date:  01/17/2016   ID:  Korby Peluso, DOB February 12, 1945, MRN [id number]  PCP:  [name], MD  Cardiologist: [name]  Chief Complaint  Patient presents with  . Atrial Fibrillation    History of Present Illness:  Hanh Smelser is a 71 y.o. male with history of CAD status post CABG 4 2013 in setting of [place] with initial PTCA of the OM 2 and CABG with LIMA to the LAD, SVG to diagonal 1, SVG to OM1/R1 and SVG to distal RCA. Also has hypertension and dyslipidemia. 4.5 cm ascending aortic aneurysm on CT 05/18/15.  Patient saw [name] 12/20/15 at had new onset A. fib unknown duration.CHADSVASC=3. Plavix was stopped and he was started on Xarelto 20 mg daily. 2-D echo was ordered and brought back today. 2-D echo showed normal LVEF with moderate LVH, trivial AI, moderately dilated aortic root at 46 mm and moderate MR. Question of PFO. The ascending aorta has increased by 0.4 cm in 2 years. Echo sent to Dr. [name] for review. Left atrium was moderately dilated.  Patient comes in today with a list of his blood pressures and heart rates. He says his heart rate is 20 beats higher than usual. It's been in the 90s but if he does any exercise it gets up to 150 very quickly. His blood pressure is borderline low so we can increase his metoprolol. He is also has soreness in his chest that he marked with a marker. He denies any chest tightness or pressure, dyspnea, dizziness or presyncope. He thought it was coming from the [place].    Past Medical History:  Diagnosis Date  . Acute MI 4/13   S/P CABG X4 Sx done in [place]  . Arrhythmia    Post op a-fib w episodes, several short burst of PAF at cardiac rehab-CHADS score is 1  . BPH (benign prostatic hyperplasia)   . Carotid artery stenosis    Left 15-49 %  . Chest wall pain    Right side, muscular,ortho,MRI. U/S abd ok. On lyrica  . Chronic insomnia   . Colon polyp 10/11  . Coronary  artery disease 04/2011   s/p CABG in setting of MI in [place], [place], with initial PTCA of OM2 then CABG with LIMA to LAD, SVG to D1, SVG to OM1,/R1 and SVG to distal RCA  . DDD (degenerative disc disease), lumbar   . Dilatation of aorta (HCC)    4.5 cm ascending aortic aneurysm per chets ct 05-18-15 epic  . ED (erectile dysfunction)   . GERD (gastroesophageal reflux disease)   . Gout   . Hypertension    since mi in 2013 no htn  . Hypertriglyceridemia   . Kidney stone    uric acid, Dr [name]  renal insufficiency  . Osteoarthritis    of the knee    Past Surgical History:  Procedure Laterality Date  . CATARACT EXTRACTION W/PHACO Right 10/26/2014   Procedure: CATARACT EXTRACTION PHACO AND INTRAOCULAR LENS PLACEMENT (IOC);  Surgeon: [name], MD;  Location: [facility] ORS;  Service: Ophthalmology;  Laterality: Right;  LOT PACK: [id number] H US:01:43.0 AP:27.8 CDE:28.68  . COLONOSCOPY WITH PROPOFOL [name] 09/27/2015   Procedure: COLONOSCOPY WITH PROPOFOL;  Surgeon: [name], MD;  Location: [facility];  Service: Endoscopy;  Laterality: [name];  . CORONARY ARTERY BYPASS GRAFT  04/2011   Severe multivessel ASCAD s/p Stemi w PTCA of OM2 then s/p CABG w LIMA to LAD,SVG to OM1 /RI  and SVG to Distal RCA   . FINGER SURGERY     left finger for ganglion cyst  . KIDNEY STONE SURGERY      Current Medications: Outpatient Medications Prior to Visit  Medication Sig Dispense Refill  . co-enzyme Q-10 30 MG capsule Take 300 mg by mouth daily.    . colchicine 0.6 MG tablet Take 0.6 mg by mouth daily as needed.    . doxepin (SINEQUAN) 25 MG capsule Take 25 mg by mouth at bedtime.    [name] esomeprazole (NEXIUM) 40 MG capsule Take 40 mg by mouth daily as needed.    . fish oil-omega-3 fatty acids 1000 MG capsule Take 4 g by mouth daily.    [name] HYDROcodone-acetaminophen (NORCO) 7.5-325 MG per tablet Take 1 tablet by mouth every 6 (six) hours as needed for moderate pain.    . indomethacin (INDOCIN) 50 MG  capsule Take 50 mg by mouth 2 (two) times daily as needed (pain).     [name] loratadine (CLARITIN) 10 MG tablet Take 10 mg by mouth daily as needed.    . metoprolol tartrate (LOPRESSOR) 25 MG tablet Take 25 mg by mouth as directed. 1/2 TABLET TWICE DAILY    . pravastatin (PRAVACHOL) 20 MG tablet Take 1 tablet (20 mg total) by mouth every evening. 30 tablet 11  . rivaroxaban (XARELTO) 20 MG TABS tablet Take 1 tablet (20 mg total) by mouth daily with supper. 30 tablet 11  . vitamin B-12 (CYANOCOBALAMIN) 100 MCG tablet Take 100 mcg by mouth daily.    [name] zolpidem (AMBIEN) 10 MG tablet Take 5 mg by mouth at bedtime as needed for sleep.      No facility-administered medications prior to visit.      Allergies:   Celebrex [celecoxib]   Social History   Social History  . Marital status: Married    Spouse name: [name]  . Number of children: [name]  . Years of education: [name]   Social History Main Topics  . Smoking status: Never Smoker  . Smokeless tobacco: Never Used  . Alcohol use Yes     Comment: yes occassionally  . Drug use: No  . Sexual activity: Not Asked   Other Topics Concern  . None   Social History Narrative  . None     Family History:  The patient's   family history includes Alzheimer's disease in his mother; Arrhythmia in his father and sister; CVA in his father; Cancer in his mother.   ROS:   Please see the history of present illness.    Review of Systems  Constitution: Negative.  HENT: Negative.   Cardiovascular: Positive for chest pain and irregular heartbeat.  Respiratory: Negative.   Endocrine: Negative.   Hematologic/Lymphatic: Negative.   Musculoskeletal: Negative.   Gastrointestinal: Negative.   Genitourinary: Negative.   Neurological: Negative.    All other systems reviewed and are negative.   PHYSICAL EXAM:   VS:  BP 118/84   Pulse 99   Ht 5\' 9"  (1.753 m)   Wt 190 lb (86.2 kg)   BMI 28.06 kg/m   Physical Exam  GEN: Well nourished, well developed, in no  acute distress  HEENT: normal  Neck: no JVD, carotid bruits, or masses Cardiac:RRR; no murmurs, rubs, or gallops  Respiratory:  clear to auscultation bilaterally, normal work of breathing GI: soft, nontender, nondistended, + BS Ext: without cyanosis, clubbing, or edema, Good distal pulses bilaterally MS: no deformity or atrophy  Skin: warm and dry, no rash  Neuro:  Alert and Oriented x 3, Strength and sensation are intact Psych: euthymic mood, full affect  Wt Readings from Last 3 Encounters:  01/17/16 190 lb (86.2 kg)  12/20/15 190 lb (86.2 kg)  09/27/15 186 lb (84.4 kg)      Studies/Labs Reviewed:   EKG:  EKG is  ordered today.  The ekg ordered today demonstrates A. fib at 94 bpm  Recent Labs: 12/20/2015: ALT 34; BUN 18; Creat 1.39; Hemoglobin 17.1; Platelets 184; Potassium 4.6; Sodium 139   Lipid Panel    Component Value Date/Time   CHOL 146 12/20/2015 0914   CHOL 123 01/14/2013 0923   TRIG 189 (H) 12/20/2015 0914   TRIG 141 01/14/2013 0923   HDL 25 (L) 12/20/2015 0914   HDL 34 (L) 01/14/2013 0923   CHOLHDL 5.8 (H) 12/20/2015 0914   VLDL 38 (H) 12/20/2015 0914   LDLCALC 83 12/20/2015 0914   LDLCALC 61 01/14/2013 0923    Additional studies/ records that were reviewed today include:   2-D echo 12/22/15 Study Conclusions   - Left ventricle: The cavity size was normal. Wall thickness was   increased in a pattern of moderate LVH. Systolic function was   normal. The estimated ejection fraction was in the range of 55%   to 60%. Wall motion was normal; there were no regional wall   motion abnormalities. The study is not technically sufficient to   allow evaluation of LV diastolic function. - Aortic valve: Trileaflet. Sclerosis without stenosis. There was   trivial regurgitation. - Aorta: Ascending aortic diameter: 46 mm (S). - Ascending aorta: The ascending aorta was moderately dilated. - Mitral valve: Mildly thickened leaflets . There was moderate    regurgitation. - Left atrium: Moderately dilated. - Atrial septum: Mobile IAS. Small PFO cannot be excluded. - Tricuspid valve: There was trivial regurgitation. - Pulmonary arteries: PA peak pressure: 23 mm Hg (S). - Inferior vena cava: The vessel was normal in size. The   respirophasic diameter changes were in the normal range (>= 50%),   consistent with normal central venous pressure.   Impressions:   - Compared to a prior study in 2015, the ascending aorta now   measures 4.6 cm (up from 4.2 cm). LVEF is unchanged at 55-60%.     ASSESSMENT:    1. Paroxysmal atrial fibrillation (HCC)   2. Coronary artery disease involving native coronary artery of native heart without angina pectoris   3. Dilatation of aorta (HCC)   4. Benign essential HTN   5. Preop testing      PLAN:  In order of problems listed above:  A. fib for the past month. CHADSVASC=3. [name] recommends cardioversion. Patient has been on Xarelto 20 mg daily for the past month and has not missed a dose. We'll schedule cardioversion.Risk and benefits explained to patient who is agreeable.  CAD status post CABG in 2013 in the setting of an MI and PTCA. Patient is having some chest soreness but no angina-like he had in the past. Continue to monitor.  Dilated aorta on 2-D echo. Sent to Dr. [name] for review.  Benign essential hypertension blood pressure on the low side so can't increase metoprolol.  Preop testing chest x-ray and labs will be done today for cardioversion Friday.    Medication Adjustments/Labs and Tests Ordered: Current medicines are reviewed at length with the patient today.  Concerns regarding medicines are outlined above.  Medication changes, Labs and Tests ordered today are listed in the Patient Instructions  below. Patient Instructions  Your physician has recommended that you have a Cardioversion (DCCV). Electrical Cardioversion uses a jolt of electricity to your heart either through paddles or  wired patches attached to your chest. This is a controlled, usually prescheduled, procedure. Defibrillation is done under light anesthesia in the hospital, and you usually go home the day of the procedure. This is done to get your heart back into a normal rhythm. You are not awake for the procedure. Please see the instruction sheet given to you today. This will be done on Friday 01/20/16 by Dr Radford Pax.  Your physician recommends that you return for lab TODAY.  A chest x-ray takes a picture of the organs and structures inside the chest, including the heart, lungs, and blood vessels. This test can show several things, including, whether the heart is enlarges; whether fluid is building up in the lungs; and whether pacemaker / defibrillator leads are still in place. THIS WILL BE ONE AT [facility] [address].  Your physician recommends that you schedule a follow-up appointment in: 2 weeks with Estella Husk.      Sumner Boast, PA-C  01/17/2016 8:36 AM    [facility] [place], [place], [place]  [address] Phone: [phone]; Fax: [phone]

## 2016-01-20 NOTE — Discharge Instructions (Signed)
Electrical Cardioversion, Care After This sheet gives you information about how to care for yourself after your procedure. Your health care provider may also give you more specific instructions. If you have problems or questions, contact your health care provider. What can I expect after the procedure? After the procedure, it is common to have:  Some redness on the skin where the shocks were given. Follow these instructions at home:  Do not drive for 24 hours if you were given a medicine to help you relax (sedative).  Take over-the-counter and prescription medicines only as told by your health care provider.  Ask your health care provider how to check your pulse. Check it often.  Rest for 48 hours after the procedure or as told by your health care provider.  Avoid or limit your caffeine use as told by your health care provider. Contact a health care provider if:  You feel like your heart is beating too quickly or your pulse is not regular.  You have a serious muscle cramp that does not go away. Get help right away if:  You have discomfort in your chest.  You are dizzy or you feel faint.  You have trouble breathing or you are short of breath.  Your speech is slurred.  You have trouble moving an arm or leg on one side of your body.  Your fingers or toes turn cold or blue. This information is not intended to replace advice given to you by your health care provider. Make sure you discuss any questions you have with your health care provider. Document Released: 10/08/2012 Document Revised: 07/22/2015 Document Reviewed: 06/24/2015 Elsevier Interactive Patient Education  2017 [employer], Care After These instructions provide you with information about caring for yourself after your procedure. Your health care provider may also give you more specific instructions. Your treatment has been planned according to current medical  practices, but problems sometimes occur. Call your health care provider if you have any problems or questions after your procedure. What can I expect after the procedure? After your procedure, it is common to:  Feel sleepy for several hours.  Feel clumsy and have poor balance for several hours.  Feel forgetful about what happened after the procedure.  Have poor judgment for several hours.  Feel nauseous or vomit.  Have a sore throat if you had a breathing tube during the procedure. Follow these instructions at home: For at least 24 hours after the procedure:   Do not:  Participate in activities in which you could fall or become injured.  Drive.  Use heavy machinery.  Drink alcohol.  Take sleeping pills or medicines that cause drowsiness.  Make important decisions or sign legal documents.  Take care of children on your own.  Rest. Eating and drinking  Follow the diet that is recommended by your health care provider.  If you vomit, drink water, juice, or soup when you can drink without vomiting.  Make sure you have little or no nausea before eating solid foods. General instructions  Have a responsible adult stay with you until you are awake and alert.  Take over-the-counter and prescription medicines only as told by your health care provider.  If you smoke, do not smoke without supervision.  Keep all follow-up visits as told by your health care provider. This is important. Contact a health care provider if:  You keep feeling nauseous  or you keep vomiting.  You feel light-headed.  You develop a rash.  You have a fever. Get help right away if:  You have trouble breathing. This information is not intended to replace advice given to you by your health care provider. Make sure you discuss any questions you have with your health care provider. Document Released: 04/10/2015 Document Revised: 08/10/2015 Document Reviewed: 04/10/2015 Elsevier Interactive Patient  Education  2017 [employer].

## 2016-01-20 NOTE — CV Procedure (Signed)
    Cardioversion Note  Sean Willis [id number] [birth year]  Procedure: DC Cardioversion Indications: atrial fib  Procedure Details Consent: Obtained Time Out: Verified patient identification, verified procedure, site/side was marked, verified correct patient position, special equipment/implants available, Radiology Safety Procedures followed,  medications/allergies/relevent history reviewed, required imaging and test results available.  Performed  The patient has been on adequate anticoagulation.  The patient received Lidocaine 80 mg followed by Propofol 70 mg IV  for sedation.  Synchronous cardioversion was performed at 120  joules.  The cardioversion was successful    Complications: No apparent complications Patient did tolerate procedure well.   Thayer Headings, Brooke Bonito., MD, [facility] 01/20/2016, 12:40 PM

## 2016-01-20 NOTE — Interval H&P Note (Signed)
History and Physical Interval Note:  01/20/2016 11:57 AM  Sean Willis  has presented today for surgery, with the diagnosis of AFIB  The various methods of treatment have been discussed with the patient and family. After consideration of risks, benefits and other options for treatment, the patient has consented to  Procedure(s): CARDIOVERSION ([name]) as a surgical intervention .  The patient's history has been reviewed, patient examined, no change in status, stable for surgery.  I have reviewed the patient's chart and labs.  Questions were answered to the patient's satisfaction.     Mertie Moores

## 2016-01-20 NOTE — Anesthesia Procedure Notes (Signed)
Procedure Name: MAC Date/Time: 01/20/2016 12:39 PM Performed by: Everlean Cherry A Pre-anesthesia Checklist: Patient identified, Emergency Drugs available, Suction available and Patient being monitored Patient Re-evaluated:Patient Re-evaluated prior to inductionOxygen Delivery Method: Ambu bag

## 2016-01-20 NOTE — Anesthesia Preprocedure Evaluation (Signed)
Anesthesia Evaluation  Patient identified by MRN, date of birth, ID band Patient awake    Reviewed: Allergy & Precautions, NPO status , Patient's Chart, lab work & pertinent test results  Airway Mallampati: II  TM Distance: >3 FB Neck ROM: Full    Dental no notable dental hx.    Pulmonary neg pulmonary ROS,    Pulmonary exam normal breath sounds clear to auscultation       Cardiovascular hypertension, + CAD, + Past MI and + CABG  Normal cardiovascular exam+ dysrhythmias Atrial Fibrillation  Rhythm:Regular Rate:Normal     Neuro/Psych negative neurological ROS  negative psych ROS   GI/Hepatic negative GI ROS, Neg liver ROS,   Endo/Other  negative endocrine ROS  Renal/GU negative Renal ROS  negative genitourinary   Musculoskeletal negative musculoskeletal ROS (+)   Abdominal   Peds negative pediatric ROS (+)  Hematology negative hematology ROS (+)   Anesthesia Other Findings   Reproductive/Obstetrics negative OB ROS                             Anesthesia Physical Anesthesia Plan  ASA: III  Anesthesia Plan: General   Post-op Pain Management:    Induction: Intravenous  Airway Management Planned: Simple Face Mask  Additional Equipment:   Intra-op Plan:   Post-operative Plan: Extubation in OR  Informed Consent: I have reviewed the patients History and Physical, chart, labs and discussed the procedure including the risks, benefits and alternatives for the proposed anesthesia with the patient or authorized representative who has indicated his/her understanding and acceptance.   Dental advisory given  Plan Discussed with: CRNA  Anesthesia Plan Comments:         Anesthesia Quick Evaluation

## 2016-01-21 ENCOUNTER — Encounter (HOSPITAL_COMMUNITY): Payer: Self-pay | Admitting: Cardiovascular Disease

## 2016-01-30 ENCOUNTER — Ambulatory Visit (INDEPENDENT_AMBULATORY_CARE_PROVIDER_SITE_OTHER): Payer: Medicare Other | Admitting: Physician Assistant

## 2016-01-30 ENCOUNTER — Encounter: Payer: Self-pay | Admitting: Physician Assistant

## 2016-01-30 VITALS — BP 124/72 | HR 64 | Ht 69.0 in | Wt 193.0 lb

## 2016-01-30 DIAGNOSIS — I77819 Aortic ectasia, unspecified site: Secondary | ICD-10-CM | POA: Diagnosis not present

## 2016-01-30 DIAGNOSIS — I10 Essential (primary) hypertension: Secondary | ICD-10-CM | POA: Diagnosis not present

## 2016-01-30 DIAGNOSIS — I2510 Atherosclerotic heart disease of native coronary artery without angina pectoris: Secondary | ICD-10-CM | POA: Diagnosis not present

## 2016-01-30 DIAGNOSIS — I480 Paroxysmal atrial fibrillation: Secondary | ICD-10-CM | POA: Diagnosis not present

## 2016-01-30 NOTE — Progress Notes (Signed)
Cardiology Office Note    Date:  01/30/2016   ID:  Sean Willis, DOB [birth year], MRN [id number]  PCP:  [name], MD  Cardiologist:   No chief complaint on file.   History of Present Illness:  Sean Willis is a 71 y.o. male with history of CAD status post CABG 4 2013 in setting of [place] with initial PTCA of the OM 2 and CABG with LIMA to the LAD, SVG to diagonal 1, SVG to OM1/R1 and SVG to distal RCA. Also has hypertension and dyslipidemia. 4.5 cm ascending aortic aneurysm on CT 05/18/15.   Patient saw [name] 12/20/15 at had new onset A. fib unknown duration.CHADSVASC=3. Plavix was stopped and he was started on Xarelto 20 mg daily. 2-D echo was ordered and brought back today. 2-D echo showed normal LVEF with moderate LVH, trivial AI, moderately dilated aortic root at 46 mm and moderate MR. Question of PFO. The ascending aorta has increased by 0.4 cm in 2 years. Echo sent to Dr. [name] for review. Left atrium was moderately dilated.  Patient underwent successful cardioversion 01/20/16. He has had no symptoms of A. fib since then. He feels good and denies cardiac complaints.       Past Medical History:  Diagnosis Date  . Acute MI 4/13   S/P CABG X4 Sx done in [place]  . Arrhythmia    Post op a-fib w episodes, several short burst of PAF at cardiac rehab-CHADS score is 1  . BPH (benign prostatic hyperplasia)   . Carotid artery stenosis    Left 15-49 %  . Chest wall pain    Right side, muscular,ortho,MRI. U/S abd ok. On lyrica  . Chronic insomnia   . Colon polyp 10/11  . Coronary artery disease 04/2011   s/p CABG in setting of MI in [place], [place], with initial PTCA of OM2 then CABG with LIMA to LAD, SVG to D1, SVG to OM1,/R1 and SVG to distal RCA  . DDD (degenerative disc disease), lumbar   . Dilatation of aorta (HCC)    4.5 cm ascending aortic aneurysm per chets ct 05-18-15 epic  . ED (erectile dysfunction)   . GERD (gastroesophageal  reflux disease)   . Gout   . Hypertension    since mi in 2013 no htn  . Hypertriglyceridemia   . Kidney stone    uric acid, Dr [name]  renal insufficiency  . Osteoarthritis    of the knee    Past Surgical History:  Procedure Laterality Date  . CARDIOVERSION [name] 01/20/2016   Procedure: CARDIOVERSION;  Surgeon: Thayer Headings, MD;  Location: [facility];  Service: Cardiovascular;  Laterality: [name];  . CATARACT EXTRACTION W/PHACO Right 10/26/2014   Procedure: CATARACT EXTRACTION PHACO AND INTRAOCULAR LENS PLACEMENT ([place]);  Surgeon: [name], MD;  Location: [facility];  Service: Ophthalmology;  Laterality: Right;  LOT PACK: [id number] H US:01:43.0 AP:27.8 CDE:28.68  . COLONOSCOPY WITH PROPOFOL [name] 09/27/2015   Procedure: COLONOSCOPY WITH PROPOFOL;  Surgeon: [name], MD;  Location: [facility];  Service: Endoscopy;  Laterality: [name];  . CORONARY ARTERY BYPASS GRAFT  04/2011   Severe multivessel ASCAD s/p Stemi w PTCA of OM2 then s/p CABG w LIMA to LAD,SVG to OM1 /RI and SVG to Distal RCA   . FINGER SURGERY     left finger for ganglion cyst  . KIDNEY STONE SURGERY      Current Medications: Outpatient Medications Prior to Visit  Medication Sig Dispense Refill  . co-enzyme  Q-10 30 MG capsule Take 300 mg by mouth daily.    . colchicine 0.6 MG tablet Take 0.6 mg by mouth daily as needed.    . doxepin (SINEQUAN) 25 MG capsule Take 25 mg by mouth at bedtime.    [name] esomeprazole (NEXIUM) 40 MG capsule Take 40 mg by mouth daily as needed.    . fish oil-omega-3 fatty acids 1000 MG capsule Take 4 g by mouth daily.    [name] HYDROcodone-acetaminophen (NORCO) 7.5-325 MG per tablet Take 1 tablet by mouth every 6 (six) hours as needed for moderate pain.    . indomethacin (INDOCIN) 50 MG capsule Take 50 mg by mouth 2 (two) times daily as needed (pain).     [name] loratadine (CLARITIN) 10 MG tablet Take 10 mg by mouth daily as needed.    . metoprolol tartrate (LOPRESSOR) 25 MG tablet Take 25 mg by  mouth as directed. 1/2 TABLET TWICE DAILY    . pravastatin (PRAVACHOL) 20 MG tablet Take 1 tablet (20 mg total) by mouth every evening. 30 tablet 11  . rivaroxaban (XARELTO) 20 MG TABS tablet Take 1 tablet (20 mg total) by mouth daily with supper. 30 tablet 11  . vitamin B-12 (CYANOCOBALAMIN) 100 MCG tablet Take 100 mcg by mouth daily.    [name] zolpidem (AMBIEN) 10 MG tablet Take 5 mg by mouth at bedtime as needed for sleep.      No facility-administered medications prior to visit.      Allergies:   Celebrex [celecoxib]   Social History   Social History  . Marital status: Married    Spouse name: [name]  . Number of children: [name]  . Years of education: [name]   Social History Main Topics  . Smoking status: Never Smoker  . Smokeless tobacco: Never Used  . Alcohol use Yes     Comment: yes occassionally  . Drug use: No  . Sexual activity: Not Asked   Other Topics Concern  . None   Social History Narrative  . None     Family History:  The patient's family history includes Alzheimer's disease in his mother; Arrhythmia in his father and sister; CVA in his father; Cancer in his mother.   ROS:   Please see the history of present illness.    Review of Systems  Constitution: Negative.  HENT: Negative.   Cardiovascular: Negative.   Respiratory: Negative.   Endocrine: Negative.   Hematologic/Lymphatic: Negative.   Musculoskeletal: Negative.   Gastrointestinal: Negative.   Genitourinary: Negative.   Neurological: Negative.    All other systems reviewed and are negative.   PHYSICAL EXAM:   VS:  BP 124/72   Pulse 64   Ht 5\' 9"  (1.753 m)   Wt 193 lb (87.5 kg)   SpO2 94%   BMI 28.50 kg/m   Physical Exam  GEN: Well nourished, well developed, in no acute distress  Neck: no JVD, carotid bruits, or masses Cardiac:RRR; no murmurs, rubs, or gallops  Respiratory:  clear to auscultation bilaterally, normal work of breathing GI: soft, nontender, nondistended, + BS Ext: without cyanosis,  clubbing, or edema, Good distal pulses bilaterally Psych: euthymic mood, full affect  Wt Readings from Last 3 Encounters:  01/30/16 193 lb (87.5 kg)  01/17/16 190 lb (86.2 kg)  12/20/15 190 lb (86.2 kg)      Studies/Labs Reviewed:   EKG:  EKG is ordered today.  The ekg ordered today demonstrates normal sinus rhythm, normal EKG  Recent Labs: 12/20/2015: ALT 34;  Hemoglobin 17.1 01/17/2016: BUN 17; Creatinine, Ser 1.20; Platelets 172; Potassium 4.5; Sodium 141   Lipid Panel    Component Value Date/Time   CHOL 146 12/20/2015 0914   CHOL 123 01/14/2013 0923   TRIG 189 (H) 12/20/2015 0914   TRIG 141 01/14/2013 0923   HDL 25 (L) 12/20/2015 0914   HDL 34 (L) 01/14/2013 0923   CHOLHDL 5.8 (H) 12/20/2015 0914   VLDL 38 (H) 12/20/2015 0914   LDLCALC 83 12/20/2015 0914   LDLCALC 61 01/14/2013 0923    Additional studies/ records that were reviewed today include:   2-D echo 12/22/15 Study Conclusions   - Left ventricle: The cavity size was normal. Wall thickness was   increased in a pattern of moderate LVH. Systolic function was   normal. The estimated ejection fraction was in the range of 55%   to 60%. Wall motion was normal; there were no regional wall   motion abnormalities. The study is not technically sufficient to   allow evaluation of LV diastolic function. - Aortic valve: Trileaflet. Sclerosis without stenosis. There was   trivial regurgitation. - Aorta: Ascending aortic diameter: 46 mm (S). - Ascending aorta: The ascending aorta was moderately dilated. - Mitral valve: Mildly thickened leaflets . There was moderate   regurgitation. - Left atrium: Moderately dilated. - Atrial septum: Mobile IAS. Small PFO cannot be excluded. - Tricuspid valve: There was trivial regurgitation. - Pulmonary arteries: PA peak pressure: 23 mm Hg (S). - Inferior vena cava: The vessel was normal in size. The   respirophasic diameter changes were in the normal range (>= 50%),   consistent with  normal central venous pressure.   Impressions:   - Compared to a prior study in 2015, the ascending aorta now   measures 4.6 cm (up from 4.2 cm). LVEF is unchanged at 55-60%.        ASSESSMENT:    1. Paroxysmal atrial fibrillation (HCC)   2. Benign essential HTN   3. Coronary artery disease involving native coronary artery of native heart without angina pectoris   4. Dilatation of aorta (HCC)      PLAN:  In order of problems listed above:  PAF status post cardioversion 01/20/16, holding normal sinus rhythm.CHADSVASC=3. Continue low-dose metoprolol and Xarelto. I'll up with [name] in 2 months.  Essential hypertension controlled  CAD status post CABG in 2013 in the setting of an MI and PTCA. No angina.  Dilated aorta on 2-D echo sent to Dr. [name] for review.  Medication Adjustments/Labs and Tests Ordered: Current medicines are reviewed at length with the patient today.  Concerns regarding medicines are outlined above.  Medication changes, Labs and Tests ordered today are listed in the Patient Instructions below. There are no Patient Instructions on file for this visit.   Sumner Boast, PA-C  01/30/2016 10:49 AM    [facility] [place] [address], [place], [place]  [address] Phone: [phone]; Fax: [phone]

## 2016-01-30 NOTE — Patient Instructions (Addendum)
Medication Instructions:   Your physician recommends that you continue on your current medications as directed. Please refer to the Current Medication list given to you today.     If you need a refill on your cardiac medications before your next appointment, please call your pharmacy.  Labwork: NONE ORDERED  TODAY    Testing/Procedures: NONE ORDERED  TODAY    Follow-Up:  In 2 MONTHS WITH DR TURNER    Any Other Special Instructions Will Be Listed Below (If Applicable).

## 2016-01-31 DIAGNOSIS — I252 Old myocardial infarction: Secondary | ICD-10-CM | POA: Diagnosis not present

## 2016-01-31 DIAGNOSIS — G4700 Insomnia, unspecified: Secondary | ICD-10-CM | POA: Diagnosis not present

## 2016-01-31 DIAGNOSIS — I10 Essential (primary) hypertension: Secondary | ICD-10-CM | POA: Diagnosis not present

## 2016-01-31 DIAGNOSIS — I4891 Unspecified atrial fibrillation: Secondary | ICD-10-CM | POA: Diagnosis not present

## 2016-01-31 DIAGNOSIS — N183 Chronic kidney disease, stage 3 (moderate): Secondary | ICD-10-CM | POA: Diagnosis not present

## 2016-01-31 DIAGNOSIS — I25810 Atherosclerosis of coronary artery bypass graft(s) without angina pectoris: Secondary | ICD-10-CM | POA: Diagnosis not present

## 2016-01-31 DIAGNOSIS — N200 Calculus of kidney: Secondary | ICD-10-CM | POA: Diagnosis not present

## 2016-01-31 DIAGNOSIS — E782 Mixed hyperlipidemia: Secondary | ICD-10-CM | POA: Diagnosis not present

## 2016-02-16 ENCOUNTER — Telehealth: Payer: Self-pay | Admitting: Cardiology

## 2016-02-16 NOTE — Telephone Encounter (Signed)
New Message   Per pt thinks he may have went back into Afib. Stated he was a little unsure, and would like a call back to see what he needs to do.

## 2016-02-16 NOTE — Telephone Encounter (Signed)
Patient states he converted to afib this morning and is still in afib with a rate of 102 and "fine BP." He reports no other symptoms, just anxiety about being in afib again.  Instructed patient to take it easy and to take an extra 12.5 mg Metoprolol now. He will take regularly scheduled nighttime dose and morning dose tomorrow. Scheduled patient for EKG tomorrow at 11. He understands to go to ED if HR increases and sustains over 130. He was grateful for call and agrees with treatment plan.

## 2016-02-17 ENCOUNTER — Ambulatory Visit (INDEPENDENT_AMBULATORY_CARE_PROVIDER_SITE_OTHER): Payer: Medicare Other

## 2016-02-17 DIAGNOSIS — I480 Paroxysmal atrial fibrillation: Secondary | ICD-10-CM

## 2016-02-17 MED ORDER — METOPROLOL TARTRATE 25 MG PO TABS: ORAL_TABLET | ORAL | 3 refills | Status: DC

## 2016-02-17 NOTE — Progress Notes (Signed)
**Note De-Identified Sean Willis Obfuscation** 1.) Reason for visit: EKG  2.) Name of MD requesting visit: Dr Radford Pax  3.) H&P: The pt had a cardioversion for Paroxysmal atrial fibrillation on 01/20/16.  4.) ROS related to problem: The pt called the office yesterday to report that he thought he had converted back to Afib with a HR of 102. He was advised to take an addition  1/2 dose of Metoprolol and to come to the office today for an EKG.  5.) Assessment and plan per MD: EKG obtained and given to Dr Lovena Le (DOD) for his review. Per Dr Dr Lovena Le the pt is back in Afib and is advised that he may take an additional tablet of Metoprolol daily as needed for elevated HR/palpitations. The pt verbalized understanding and is in agreement with the plan. He requested a sooner appt with Dr Radford Pax so his appt has been rescheduled to 3/13 with Dr Radford Pax.

## 2016-03-06 ENCOUNTER — Encounter: Payer: Self-pay | Admitting: Cardiology

## 2016-03-13 ENCOUNTER — Encounter: Payer: Self-pay | Admitting: Cardiology

## 2016-03-13 ENCOUNTER — Encounter (HOSPITAL_COMMUNITY): Payer: Self-pay | Admitting: Nurse Practitioner

## 2016-03-13 ENCOUNTER — Ambulatory Visit: Payer: Medicare Other | Admitting: Cardiology

## 2016-03-13 ENCOUNTER — Telehealth: Payer: Self-pay | Admitting: Pharmacist

## 2016-03-13 ENCOUNTER — Ambulatory Visit (HOSPITAL_COMMUNITY)
Admission: RE | Admit: 2016-03-13 | Discharge: 2016-03-13 | Disposition: A | Discharge status: Home / Self Care | Payer: Medicare Other | Source: Ambulatory Visit | Attending: Nurse Practitioner | Admitting: Nurse Practitioner

## 2016-03-13 ENCOUNTER — Ambulatory Visit (INDEPENDENT_AMBULATORY_CARE_PROVIDER_SITE_OTHER): Payer: Medicare Other | Admitting: Cardiology

## 2016-03-13 VITALS — BP 140/84 | HR 104 | Ht 69.0 in | Wt 192.0 lb

## 2016-03-13 VITALS — BP 128/84 | HR 92 | Ht 69.0 in | Wt 191.0 lb

## 2016-03-13 DIAGNOSIS — Z823 Family history of stroke: Secondary | ICD-10-CM | POA: Insufficient documentation

## 2016-03-13 DIAGNOSIS — I2510 Atherosclerotic heart disease of native coronary artery without angina pectoris: Secondary | ICD-10-CM | POA: Diagnosis not present

## 2016-03-13 DIAGNOSIS — I252 Old myocardial infarction: Secondary | ICD-10-CM | POA: Diagnosis not present

## 2016-03-13 DIAGNOSIS — M5136 Other intervertebral disc degeneration, lumbar region: Secondary | ICD-10-CM | POA: Insufficient documentation

## 2016-03-13 DIAGNOSIS — Z9889 Other specified postprocedural states: Secondary | ICD-10-CM | POA: Diagnosis not present

## 2016-03-13 DIAGNOSIS — E782 Mixed hyperlipidemia: Secondary | ICD-10-CM

## 2016-03-13 DIAGNOSIS — K219 Gastro-esophageal reflux disease without esophagitis: Secondary | ICD-10-CM | POA: Insufficient documentation

## 2016-03-13 DIAGNOSIS — I480 Paroxysmal atrial fibrillation: Secondary | ICD-10-CM

## 2016-03-13 DIAGNOSIS — Z86010 Personal history of colonic polyps: Secondary | ICD-10-CM | POA: Insufficient documentation

## 2016-03-13 DIAGNOSIS — N529 Male erectile dysfunction, unspecified: Secondary | ICD-10-CM | POA: Diagnosis not present

## 2016-03-13 DIAGNOSIS — M109 Gout, unspecified: Secondary | ICD-10-CM | POA: Insufficient documentation

## 2016-03-13 DIAGNOSIS — I10 Essential (primary) hypertension: Secondary | ICD-10-CM | POA: Insufficient documentation

## 2016-03-13 DIAGNOSIS — Z87442 Personal history of urinary calculi: Secondary | ICD-10-CM | POA: Diagnosis not present

## 2016-03-13 DIAGNOSIS — I712 Thoracic aortic aneurysm, without rupture: Secondary | ICD-10-CM | POA: Insufficient documentation

## 2016-03-13 DIAGNOSIS — Z7901 Long term (current) use of anticoagulants: Secondary | ICD-10-CM | POA: Insufficient documentation

## 2016-03-13 DIAGNOSIS — Z951 Presence of aortocoronary bypass graft: Secondary | ICD-10-CM | POA: Diagnosis not present

## 2016-03-13 DIAGNOSIS — I481 Persistent atrial fibrillation: Secondary | ICD-10-CM | POA: Diagnosis not present

## 2016-03-13 DIAGNOSIS — E781 Pure hyperglyceridemia: Secondary | ICD-10-CM | POA: Diagnosis not present

## 2016-03-13 DIAGNOSIS — I4819 Other persistent atrial fibrillation: Secondary | ICD-10-CM

## 2016-03-13 LAB — BASIC METABOLIC PANEL
Anion gap: 11 (ref 5–15)
BUN: 19 mg/dL (ref 6–20)
CO2: 23 mmol/L (ref 22–32)
Calcium: 9.3 mg/dL (ref 8.9–10.3)
Chloride: 103 mmol/L (ref 101–111)
Creatinine, Ser: 1.42 mg/dL — ABNORMAL HIGH (ref 0.61–1.24)
GFR calc Af Amer: 56 mL/min — ABNORMAL LOW (ref >60)
GFR calc non Af Amer: 49 mL/min — ABNORMAL LOW (ref >60)
Glucose, Bld: 139 mg/dL — ABNORMAL HIGH (ref 65–99)
Potassium: 5 mmol/L (ref 3.5–5.1)
Sodium: 137 mmol/L (ref 135–145)

## 2016-03-13 LAB — MAGNESIUM: Magnesium: 2.1 mg/dL (ref 1.7–2.4)

## 2016-03-13 NOTE — Progress Notes (Signed)
Cardiology Office Note    Date:  03/13/2016   ID:  Sean Willis, DOB Jun 09, 1945, MRN [id number]  PCP:  [name], MD  Cardiologist:  Fransico Him, MD   Chief Complaint  Patient presents with  . Coronary Artery Disease  . Hypertension  . Hyperlipidemia    History of Present Illness:  Sean Willis is a 71 y.o. male with a history of ASCAD (status post CABG 4 2013 in setting of [place] with initial PTCA of the OM 2 and CABG with LIMA to the LAD, SVG to diagonal 1, SVG to OM1/R1 and SVG to distal RCA), HTN, moderate MR and dyslipidemia.At last OV with me he was found to be in new onset atrial fibrillation and was placed on Xarelto.  He underwent DCCV to NSR on 01/20/2016.  He was seen back in the office by my PA and was doing well maintaining NSR.  He also has a history of dilated aortic root with last echo showing a diameter of 6mm and he is followed by Dr. [name]. In February, he was hanging a porch swing and went into atrial fibrillation.  He came in for an EKG on 2/16 which showed atrial fibrillation with CVR.  He has been in it since then. He says that if he exerts himself any and HR goes up into the 120's.  He denies any chest pain or pressure.  Since being in afib he has had some mild SOB with exertion when HR gets up.  He denies any LE edema, dizziness,claudication or syncope. He plays golf a few times weekly but since going back into afib he has had to stop due to HR increasing.  He says that his wife says that he snores at night.  He has no history of witnessed apnea. He is fatigued in the am now that he is back in afib.   Past Medical History:  Diagnosis Date  . Acute MI 4/13   S/P CABG X4 Sx done in [place]  . Arrhythmia    Post op a-fib w episodes, several short burst of PAF at cardiac rehab-CHADS score is 1  . BPH (benign prostatic hyperplasia)   . Carotid artery stenosis    Left 15-49 %  . Chest wall pain    Right side,  muscular,ortho,MRI. U/S abd ok. On lyrica  . Chronic insomnia   . Colon polyp 10/11  . Coronary artery disease 04/2011   s/p CABG in setting of MI in [place], [place], with initial PTCA of OM2 then CABG with LIMA to LAD, SVG to D1, SVG to OM1,/R1 and SVG to distal RCA  . DDD (degenerative disc disease), lumbar   . Dilatation of aorta (HCC)    4.5 cm ascending aortic aneurysm per chets ct 05-18-15 epic  . ED (erectile dysfunction)   . GERD (gastroesophageal reflux disease)   . Gout   . Hypertension    since mi in 2013 no htn  . Hypertriglyceridemia   . Kidney stone    uric acid, Dr [name]  renal insufficiency  . Osteoarthritis    of the knee    Past Surgical History:  Procedure Laterality Date  . CARDIOVERSION [name] 01/20/2016   Procedure: CARDIOVERSION;  Surgeon: Thayer Headings, MD;  Location: [facility];  Service: Cardiovascular;  Laterality: [name];  . CATARACT EXTRACTION W/PHACO Right 10/26/2014   Procedure: CATARACT EXTRACTION PHACO AND INTRAOCULAR LENS PLACEMENT ([place]);  Surgeon: [name], MD;  Location: [facility];  Service: Ophthalmology;  Laterality: Right;  LOT PACK: [id number] H US:01:43.0 AP:27.8 CDE:28.68  . COLONOSCOPY WITH PROPOFOL [name] 09/27/2015   Procedure: COLONOSCOPY WITH PROPOFOL;  Surgeon: [name], MD;  [facility];  Service: Endoscopy;  Laterality: [name];  . CORONARY ARTERY BYPASS GRAFT  04/2011   Severe multivessel ASCAD s/p Stemi w PTCA of OM2 then s/p CABG w LIMA to LAD,SVG to OM1 /RI and SVG to Distal RCA   . FINGER SURGERY     left finger for ganglion cyst  . KIDNEY STONE SURGERY      Current Medications: Current Meds  Medication Sig  . co-enzyme Q-10 30 MG capsule Take 300 mg by mouth daily.  . colchicine 0.6 MG tablet Take 0.6 mg by mouth daily as needed.  . doxepin (SINEQUAN) 25 MG capsule Take 25 mg by mouth at bedtime.  [name] esomeprazole (NEXIUM) 40 MG capsule Take 40 mg by mouth daily as needed.  . fish oil-omega-3 fatty acids 1000  MG capsule Take 4 g by mouth daily.  [name] HYDROcodone-acetaminophen (NORCO) 7.5-325 MG per tablet Take 1 tablet by mouth every 6 (six) hours as needed for moderate pain.  . indomethacin (INDOCIN) 50 MG capsule Take 50 mg by mouth 2 (two) times daily as needed (pain).   [name] loratadine (CLARITIN) 10 MG tablet Take 10 mg by mouth daily as needed.  . metoprolol tartrate (LOPRESSOR) 25 MG tablet 1TABLET TWICE DAILY. May take additional 25 mg tablet daily PRN for elevated HR/Palpitations  . pravastatin (PRAVACHOL) 40 MG tablet Take 40 mg by mouth daily.   . rivaroxaban (XARELTO) 20 MG TABS tablet Take 1 tablet (20 mg total) by mouth daily with supper.  . vitamin B-12 (CYANOCOBALAMIN) 100 MCG tablet Take 100 mcg by mouth daily.  [name] zolpidem (AMBIEN) 10 MG tablet Take 5 mg by mouth at bedtime as needed for sleep.     Allergies:   Celebrex [celecoxib]   Social History   Social History  . Marital status: Married    Spouse name: [name]  . Number of children: [name]  . Years of education: [name]   Social History Main Topics  . Smoking status: Never Smoker  . Smokeless tobacco: Never Used  . Alcohol use Yes     Comment: yes occassionally  . Drug use: No  . Sexual activity: Not Asked   Other Topics Concern  . None   Social History Narrative  . None     Family History:  The patient's family history includes Alzheimer's disease in his mother; Arrhythmia in his father and sister; CVA in his father; Cancer in his mother.   ROS:   Please see the history of present illness.    ROS All other systems reviewed and are negative.  No flowsheet data found.     PHYSICAL EXAM:   VS:  BP 128/84   Pulse 92   Ht 5\' 9"  (1.753 m)   Wt 191 lb (86.6 kg)   SpO2 97%   BMI 28.21 kg/m    GEN: Well nourished, well developed, in no acute distress  HEENT: normal  Neck: no JVD, carotid bruits, or masses Cardiac: RRR; no murmurs, rubs, or gallops,no edema.  Intact distal pulses bilaterally.  Respiratory:  clear to  auscultation bilaterally, normal work of breathing GI: soft, nontender, nondistended, + BS MS: no deformity or atrophy  Skin: warm and dry, no rash Neuro:  Alert and Oriented x 3, Strength and sensation are intact Psych: euthymic mood, full affect  Wt Readings  from Last 3 Encounters:  03/13/16 191 lb (86.6 kg)  01/30/16 193 lb (87.5 kg)  01/17/16 190 lb (86.2 kg)      Studies/Labs Reviewed:    Recent Labs: 12/20/2015: ALT 34; Hemoglobin 17.1 01/17/2016: BUN 17; Creatinine, Ser 1.20; Platelets 172; Potassium 4.5; Sodium 141   Lipid Panel    Component Value Date/Time   CHOL 146 12/20/2015 0914   CHOL 123 01/14/2013 0923   TRIG 189 (H) 12/20/2015 0914   TRIG 141 01/14/2013 0923   HDL 25 (L) 12/20/2015 0914   HDL 34 (L) 01/14/2013 0923   CHOLHDL 5.8 (H) 12/20/2015 0914   VLDL 38 (H) 12/20/2015 0914   LDLCALC 83 12/20/2015 0914   LDLCALC 61 01/14/2013 0923    Additional studies/ records that were reviewed today include:  none    ASSESSMENT:    No diagnosis found.   PLAN:  In order of problems listed above:  1. ASCAD - s/p CABG in setting of MI in [place], [place], with initial PTCA of OM2 then CABG with LIMA to LAD, SVG to D1, SVG to OM1,/R1 and SVG to distal RCA.  He has not had any anginal symptoms.  Continue ASA/statin and BB.   2. Dilated ascending aorta - 28mm by recent chest CT.  Continue BB and statin. Followed by Dr. [name].  3. HTN - BP controlled on current meds.  Continue BB  4. Bilateral carotid artery disease - continue ASA and statin.  Repeat dopplers 12/2016.  5. Hyperlipidemia - LDL goal < 70.  Continue statin.  His last LDL was not at goal and he refused to uptitrate his statin.  It was higher in January and his PCP increased his Pravastatin to 40mg  daily.  He did not tolerate high dose statin and is intolerant to crestor.  I will repeat FLP and ALT.  6.  Moderate MR - I will repeat an echo in 1 year to make sure that is has not progressed.  7.   Persistent atrial fibrillation now back in afib after being in NSR for 1 month post DCCV.  His CHADS2VASC score is 3. Continue on Xarelto 20mg  daily.    BMET and CBC stable from January. I think at this point he will need to be on AAD therapy.  Class IC agents are contraindicated due to CAD.  Amio and Tikosyn are possibilities.  Will refer to afib clniic for further recommendations.   He will continue on metoprolol 25mg  BID for now.  I am going to order a sleep study to rule out OSA as a trigger for his afib.   Medication Adjustments/Labs and Tests Ordered: Current medicines are reviewed at length with the patient today.  Concerns regarding medicines are outlined above.  Medication changes, Labs and Tests ordered today are listed in the Patient Instructions below.  There are no Patient Instructions on file for this visit.   Signed, Fransico Him, MD  03/13/2016 11:48 AM    [address], [place], [place]  [address] Phone: [phone]; Fax: [phone]

## 2016-03-13 NOTE — Telephone Encounter (Signed)
Pt to be started on Tikosyn in the future. Reviewed medication list for QTc prolonging medications or contraindicated medications. Pt takes doxepin which may prolong QTc, no other medication issues. This is not an absolute contraindication but would have pt follow up with PCP to see if alternative therapy is appropriate. He is therapeutically anticoagulated on Xarelto 20mg  daily (CrCl 59mL/min). Will need to make sure pt has not missed any doses of Xarelto within the 3 weeks prior to Tikosyn start.

## 2016-03-13 NOTE — Patient Instructions (Signed)
Medication Instructions:  Your physician recommends that you continue on your current medications as directed. Please refer to the Current Medication list given to you today.   Labwork: Your physician recommends that you return for FASTING lab work in one week.  Testing/Procedures: Your physician has recommended that you have a sleep study. This test records several body functions during sleep, including: brain activity, eye movement, oxygen and carbon dioxide blood levels, heart rate and rhythm, breathing rate and rhythm, the flow of air through your mouth and nose, snoring, body muscle movements, and chest and belly movement.  Follow-Up: You have been referred to AFIB CLINIC.  Your physician recommends that you schedule a follow-up appointment in 3 MONTHS with [name].  Any Other Special Instructions Will Be Listed Below (If Applicable).     If you need a refill on your cardiac medications before your next appointment, please call your pharmacy.

## 2016-03-14 ENCOUNTER — Telehealth (HOSPITAL_COMMUNITY): Payer: Self-pay | Admitting: *Deleted

## 2016-03-14 ENCOUNTER — Other Ambulatory Visit (HOSPITAL_COMMUNITY): Payer: Self-pay | Admitting: *Deleted

## 2016-03-14 ENCOUNTER — Other Ambulatory Visit: Payer: Medicare Other | Admitting: *Deleted

## 2016-03-14 DIAGNOSIS — E782 Mixed hyperlipidemia: Secondary | ICD-10-CM

## 2016-03-14 DIAGNOSIS — I4819 Other persistent atrial fibrillation: Secondary | ICD-10-CM

## 2016-03-14 LAB — LIPID PANEL
CHOLESTEROL TOTAL: 169 mg/dL (ref 100–199)
Chol/HDL Ratio: 5.8 ratio units — ABNORMAL HIGH (ref 0.0–5.0)
HDL: 29 mg/dL — AB (ref >39)
LDL Calculated: 91 mg/dL (ref 0–99)
Triglycerides: 246 mg/dL — ABNORMAL HIGH (ref 0–149)
VLDL CHOLESTEROL CAL: 49 mg/dL — AB (ref 5–40)

## 2016-03-14 MED ORDER — METOPROLOL TARTRATE 50 MG PO TABS: 50.0000 mg | ORAL_TABLET | Freq: Two times a day (BID) | ORAL | 3 refills | Status: DC

## 2016-03-14 NOTE — Progress Notes (Signed)
Primary Care Physician: [name], MD Referring Physician: Dr. Rance Muir Sean Willis is a 71 y.o. male with a h/o of ASCAD (status post CABG 4 2013 in setting of [place] with initial PTCA of the OM 2 and CABG with LIMA to the LAD, SVG to diagonal 1, SVG to OM1/R1 and SVG to distal RCA), HTN, moderate MR and dyslipidemia.He  was found to be in new onset atrial fibrillation in the last several months and was placed on Xarelto.  He underwent DCCV to NSR on 01/20/2016.  He was seen back  at [facility]  and was doing well maintaining NSR following cardioversion.  He also has a history of dilated aortic root with last echo showing a diameter of 46 mm and he is followed by Dr. [name]. In February, he was hanging a porch swing, climbed up a ladder and went into atrial fibrillation. He came in for an EKG on 2/16 which showed atrial fibrillation with CVR.  He has been in it since then.   He is in the afib clinic, 3/20, being seen by Dr.Turner earlier today and is here to discuss antiarrythmic options.   Today, he denies symptoms of palpitations, chest pain, shortness of breath, orthopnea, PND, lower extremity edema, dizziness, presyncope, syncope, or neurologic sequela. The patient is tolerating medications without difficulties and is otherwise without complaint today.   Past Medical History:  Diagnosis Date  . Acute MI 4/13   S/P CABG X4 Sx done in [place]  . Arrhythmia    Post op a-fib w episodes, several short burst of PAF at cardiac rehab-CHADS score is 1  . BPH (benign prostatic hyperplasia)   . Carotid artery stenosis    Left 15-49 %  . Chest wall pain    Right side, muscular,ortho,MRI. U/S abd ok. On lyrica  . Chronic insomnia   . Colon polyp 10/11  . Coronary artery disease 04/2011   s/p CABG in setting of MI in [place], [place], with initial PTCA of OM2 then CABG with LIMA to LAD, SVG to D1, SVG to OM1,/R1 and SVG to distal RCA  . DDD (degenerative disc  disease), lumbar   . Dilatation of aorta (HCC)    4.5 cm ascending aortic aneurysm per chets ct 05-18-15 epic  . ED (erectile dysfunction)   . GERD (gastroesophageal reflux disease)   . Gout   . Hypertension    since mi in 2013 no htn  . Hypertriglyceridemia   . Kidney stone    uric acid, Dr [name]  renal insufficiency  . Osteoarthritis    of the knee   Past Surgical History:  Procedure Laterality Date  . CARDIOVERSION [name] 01/20/2016   Procedure: CARDIOVERSION;  Surgeon: Thayer Headings, MD;  Location: [facility];  Service: Cardiovascular;  Laterality: [name];  . CATARACT EXTRACTION W/PHACO Right 10/26/2014   Procedure: CATARACT EXTRACTION PHACO AND INTRAOCULAR LENS PLACEMENT ([place]);  Surgeon: [name], MD;  Location: [facility];  Service: Ophthalmology;  Laterality: Right;  LOT PACK: [id number] H US:01:43.0 AP:27.8 CDE:28.68  . COLONOSCOPY WITH PROPOFOL [name] 09/27/2015   Procedure: COLONOSCOPY WITH PROPOFOL;  Surgeon: [name], MD;  [facility];  Service: Endoscopy;  Laterality: [name];  . CORONARY ARTERY BYPASS GRAFT  04/2011   Severe multivessel ASCAD s/p Stemi w PTCA of OM2 then s/p CABG w LIMA to LAD,SVG to OM1 /RI and SVG to Distal RCA   . FINGER SURGERY     left finger for ganglion cyst  .  KIDNEY STONE SURGERY      Current Outpatient Prescriptions  Medication Sig Dispense Refill  . co-enzyme Q-10 30 MG capsule Take 300 mg by mouth daily.    [name] doxepin (SINEQUAN) 25 MG capsule Take 25 mg by mouth at bedtime.    [name] esomeprazole (NEXIUM) 40 MG capsule Take 40 mg by mouth daily as needed.    . fish oil-omega-3 fatty acids 1000 MG capsule Take 4 g by mouth daily.    [name] HYDROcodone-acetaminophen (NORCO) 7.5-325 MG per tablet Take 1 tablet by mouth every 6 (six) hours as needed for moderate pain.    . metoprolol tartrate (LOPRESSOR) 25 MG tablet Take 25 mg by mouth 2 (two) times daily.    . pravastatin (PRAVACHOL) 40 MG tablet Take 40 mg by mouth daily.     .  rivaroxaban (XARELTO) 20 MG TABS tablet Take 1 tablet (20 mg total) by mouth daily with supper. 30 tablet 11  . vitamin B-12 (CYANOCOBALAMIN) 100 MCG tablet Take 100 mcg by mouth daily.    [name] zolpidem (AMBIEN) 10 MG tablet Take 5 mg by mouth at bedtime as needed for sleep.     [name] loratadine (CLARITIN) 10 MG tablet Take 10 mg by mouth daily as needed.     No current facility-administered medications for this encounter.     Allergies  Allergen Reactions  . Celebrex [Celecoxib]     Upset stomach    Social History   Social History  . Marital status: Married    Spouse name: [name]  . Number of children: [name]  . Years of education: [name]   Occupational History  . Not on file.   Social History Main Topics  . Smoking status: Never Smoker  . Smokeless tobacco: Never Used  . Alcohol use Yes     Comment: yes occassionally  . Drug use: No  . Sexual activity: Not on file   Other Topics Concern  . Not on file   Social History Narrative  . No narrative on file    Family History  Problem Relation Age of Onset  . Alzheimer's disease Mother   . Cancer Mother   . CVA Father   . Arrhythmia Father   . Arrhythmia Sister     ROS- All systems are reviewed and negative except as per the HPI above  Physical Exam: Vitals:   03/13/16 1454  BP: 140/84  Pulse: (!) 104  Weight: 192 lb (87.1 kg)  Height: 5\' 9"  (1.753 m)   Wt Readings from Last 3 Encounters:  03/13/16 192 lb (87.1 kg)  03/13/16 191 lb (86.6 kg)  01/30/16 193 lb (87.5 kg)    Labs: Lab Results  Component Value Date   NA 137 03/13/2016   K 5.0 03/13/2016   CL 103 03/13/2016   CO2 23 03/13/2016   GLUCOSE 139 (H) 03/13/2016   BUN 19 03/13/2016   CREATININE 1.42 (H) 03/13/2016   CALCIUM 9.3 03/13/2016   MG 2.1 03/13/2016   Lab Results  Component Value Date   INR 1.2 01/17/2016   Lab Results  Component Value Date   CHOL 146 12/20/2015   HDL 25 (L) 12/20/2015   LDLCALC 83 12/20/2015   TRIG 189 (H) 12/20/2015      GEN- The patient is well appearing, alert and oriented x 3 today.   Head- normocephalic, atraumatic Eyes-  Sclera clear, conjunctiva pink Ears- hearing intact Oropharynx- clear Neck- supple, no JVP Lymph- no cervical lymphadenopathy Lungs- Clear to ausculation bilaterally,  normal work of breathing Heart- Regular rate and rhythm, no murmurs, rubs or gallops, PMI not laterally displaced GI- soft, NT, ND, + BS Extremities- no clubbing, cyanosis, or edema MS- no significant deformity or atrophy Skin- no rash or lesion Psych- euthymic mood, full affect Neuro- strength and sensation are intact  EKG-afib at 104 bpm, qrs int 92 ms, qtc 441 ms Epic records reviewed Echo- 12/21- Study Conclusions  - Left ventricle: The cavity size was normal. Wall thickness was   increased in a pattern of moderate LVH. Systolic function was   normal. The estimated ejection fraction was in the range of 55%   to 60%. Wall motion was normal; there were no regional wall   motion abnormalities. The study is not technically sufficient to   allow evaluation of LV diastolic function. - Aortic valve: Trileaflet. Sclerosis without stenosis. There was   trivial regurgitation. - Aorta: Ascending aortic diameter: 46 mm (S). - Ascending aorta: The ascending aorta was moderately dilated. - Mitral valve: Mildly thickened leaflets . There was moderate   regurgitation. - Left atrium: Moderately dilated. - Atrial septum: Mobile IAS. Small PFO cannot be excluded. - Tricuspid valve: There was trivial regurgitation. - Pulmonary arteries: PA peak pressure: 23 mm Hg (S). - Inferior vena cava: The vessel was normal in size. The   respirophasic diameter changes were in the normal range (>= 50%),   consistent with normal central venous pressure.  Impressions:  - Compared to a prior study in 2015, the ascending aorta now   measures 4.6 cm (up from 4.2 cm). LVEF is unchanged at 55-60%.    Assessment and  Plan: 1. persistent afib Discussed antiarrythmic's with pt and he is not interested in amiodarone CAD r/o 1c agents After a very long discussion re tikosyn, pt felt that he would like to pursue this Bmet/matg obtained and meds sent for review for qtc prolonging drugs with Fuller [place], PharmD review Doxepin was a concern for qtc prolonging potential and pt was planning to discuss to PCP re weaning off(he takes for Sleep) Qtc acceptable range Crcl right at 60(59.63) and dose may have to be reduced when initiated if does not improve  Pt then called back to office and said that he did not want to discuss in front of wife, but she has primary progressive aphagia which can escalate into dementia He felt that he did not want to take any risks with the AAD's because he is her caretaker and he does not want to jeopardize hie health with possible cardiac arrest from ADD He would rather increase BB to metoprolol 50 mg bid and plan for another cardioversion He was advised not sure if it would keep him in rhythm any longer than last time He would almost prefer to live in rate controlled  afib if he fails second cardioversion He has niot missed any Doac x 3 weeks Dccv to be scheduled  F/u in one week f/u cardioversion  Butch Penny C. Carroll, [facility] [address] [place], [place] [address] [phone]

## 2016-03-14 NOTE — Telephone Encounter (Signed)
Pt given instructions for cardioversion which is scheduled for 3/23 at 10am with [name]. Pt to arrive at 8:30am main entrance of hospital. NPO after MN except meds with sip of water. Cannot drive home after procedure. Follow up appt made. Labs to be drawn on 3/22 per pt request for cbc. Instructed to call if any missed doses of xarelto. Pt verbalized understanding of instructions.

## 2016-03-19 ENCOUNTER — Other Ambulatory Visit: Payer: Self-pay | Admitting: *Deleted

## 2016-03-19 DIAGNOSIS — E782 Mixed hyperlipidemia: Secondary | ICD-10-CM

## 2016-03-19 MED ORDER — PRAVASTATIN SODIUM 80 MG PO TABS
80.0000 mg | ORAL_TABLET | Freq: Every day | ORAL | 9 refills | Status: DC
Start: 2016-03-19 — End: 2016-05-03

## 2016-03-22 ENCOUNTER — Other Ambulatory Visit: Payer: Medicare Other | Admitting: *Deleted

## 2016-03-22 DIAGNOSIS — I480 Paroxysmal atrial fibrillation: Secondary | ICD-10-CM

## 2016-03-22 DIAGNOSIS — I4819 Other persistent atrial fibrillation: Secondary | ICD-10-CM

## 2016-03-22 LAB — CBC WITH DIFFERENTIAL/PLATELET
BASOS ABS: 0 10*3/uL (ref 0.0–0.2)
Basos: 1 %
EOS (ABSOLUTE): 0.2 10*3/uL (ref 0.0–0.4)
Eos: 3 %
Hematocrit: 51.3 % — ABNORMAL HIGH (ref 37.5–51.0)
Hemoglobin: 17.9 g/dL — ABNORMAL HIGH (ref 13.0–17.7)
IMMATURE GRANS (ABS): 0 10*3/uL (ref 0.0–0.1)
IMMATURE GRANULOCYTES: 0 %
LYMPHS: 26 %
Lymphocytes Absolute: 1.8 10*3/uL (ref 0.7–3.1)
MCH: 32.4 pg (ref 26.6–33.0)
MCHC: 34.9 g/dL (ref 31.5–35.7)
MCV: 93 fL (ref 79–97)
Monocytes Absolute: 0.7 10*3/uL (ref 0.1–0.9)
Monocytes: 10 %
NEUTROS PCT: 60 %
Neutrophils Absolute: 4.2 10*3/uL (ref 1.4–7.0)
PLATELETS: 167 10*3/uL (ref 150–379)
RBC: 5.53 x10E6/uL (ref 4.14–5.80)
RDW: 13.6 % (ref 12.3–15.4)
WBC: 7 10*3/uL (ref 3.4–10.8)

## 2016-03-23 ENCOUNTER — Ambulatory Visit (HOSPITAL_COMMUNITY)
Admission: RE | Admit: 2016-03-23 | Discharge: 2016-03-23 | Disposition: A | Discharge status: Home / Self Care | Payer: Medicare Other | Source: Ambulatory Visit | Attending: Cardiology | Admitting: Cardiology

## 2016-03-23 ENCOUNTER — Ambulatory Visit (HOSPITAL_COMMUNITY): Payer: Medicare Other | Admitting: Anesthesiology

## 2016-03-23 ENCOUNTER — Encounter (HOSPITAL_COMMUNITY)
Admission: RE | Disposition: A | Discharge status: Home / Self Care | Payer: Self-pay | Source: Ambulatory Visit | Attending: Cardiology

## 2016-03-23 ENCOUNTER — Encounter (HOSPITAL_COMMUNITY): Payer: Self-pay

## 2016-03-23 DIAGNOSIS — I480 Paroxysmal atrial fibrillation: Secondary | ICD-10-CM | POA: Insufficient documentation

## 2016-03-23 DIAGNOSIS — N400 Enlarged prostate without lower urinary tract symptoms: Secondary | ICD-10-CM | POA: Diagnosis not present

## 2016-03-23 DIAGNOSIS — Z79899 Other long term (current) drug therapy: Secondary | ICD-10-CM | POA: Insufficient documentation

## 2016-03-23 DIAGNOSIS — I340 Nonrheumatic mitral (valve) insufficiency: Secondary | ICD-10-CM | POA: Insufficient documentation

## 2016-03-23 DIAGNOSIS — R130 Aphagia: Secondary | ICD-10-CM | POA: Diagnosis not present

## 2016-03-23 DIAGNOSIS — E781 Pure hyperglyceridemia: Secondary | ICD-10-CM | POA: Insufficient documentation

## 2016-03-23 DIAGNOSIS — M109 Gout, unspecified: Secondary | ICD-10-CM | POA: Diagnosis not present

## 2016-03-23 DIAGNOSIS — Z87442 Personal history of urinary calculi: Secondary | ICD-10-CM | POA: Insufficient documentation

## 2016-03-23 DIAGNOSIS — Z8249 Family history of ischemic heart disease and other diseases of the circulatory system: Secondary | ICD-10-CM | POA: Insufficient documentation

## 2016-03-23 DIAGNOSIS — F5104 Psychophysiologic insomnia: Secondary | ICD-10-CM | POA: Insufficient documentation

## 2016-03-23 DIAGNOSIS — Z820 Family history of epilepsy and other diseases of the nervous system: Secondary | ICD-10-CM | POA: Insufficient documentation

## 2016-03-23 DIAGNOSIS — Z86010 Personal history of colonic polyps: Secondary | ICD-10-CM | POA: Diagnosis not present

## 2016-03-23 DIAGNOSIS — I252 Old myocardial infarction: Secondary | ICD-10-CM | POA: Diagnosis not present

## 2016-03-23 DIAGNOSIS — E785 Hyperlipidemia, unspecified: Secondary | ICD-10-CM | POA: Diagnosis not present

## 2016-03-23 DIAGNOSIS — Z7901 Long term (current) use of anticoagulants: Secondary | ICD-10-CM | POA: Insufficient documentation

## 2016-03-23 DIAGNOSIS — Z888 Allergy status to other drugs, medicaments and biological substances status: Secondary | ICD-10-CM | POA: Diagnosis not present

## 2016-03-23 DIAGNOSIS — Z823 Family history of stroke: Secondary | ICD-10-CM | POA: Insufficient documentation

## 2016-03-23 DIAGNOSIS — I6522 Occlusion and stenosis of left carotid artery: Secondary | ICD-10-CM | POA: Diagnosis not present

## 2016-03-23 DIAGNOSIS — I481 Persistent atrial fibrillation: Secondary | ICD-10-CM

## 2016-03-23 DIAGNOSIS — I491 Atrial premature depolarization: Secondary | ICD-10-CM | POA: Diagnosis not present

## 2016-03-23 DIAGNOSIS — Z9841 Cataract extraction status, right eye: Secondary | ICD-10-CM | POA: Diagnosis not present

## 2016-03-23 DIAGNOSIS — M1990 Unspecified osteoarthritis, unspecified site: Secondary | ICD-10-CM | POA: Diagnosis not present

## 2016-03-23 DIAGNOSIS — M5136 Other intervertebral disc degeneration, lumbar region: Secondary | ICD-10-CM | POA: Insufficient documentation

## 2016-03-23 DIAGNOSIS — Z809 Family history of malignant neoplasm, unspecified: Secondary | ICD-10-CM | POA: Insufficient documentation

## 2016-03-23 DIAGNOSIS — I2510 Atherosclerotic heart disease of native coronary artery without angina pectoris: Secondary | ICD-10-CM | POA: Insufficient documentation

## 2016-03-23 DIAGNOSIS — Z951 Presence of aortocoronary bypass graft: Secondary | ICD-10-CM | POA: Insufficient documentation

## 2016-03-23 DIAGNOSIS — I10 Essential (primary) hypertension: Secondary | ICD-10-CM | POA: Diagnosis not present

## 2016-03-23 DIAGNOSIS — K219 Gastro-esophageal reflux disease without esophagitis: Secondary | ICD-10-CM | POA: Insufficient documentation

## 2016-03-23 DIAGNOSIS — M1710 Unilateral primary osteoarthritis, unspecified knee: Secondary | ICD-10-CM | POA: Insufficient documentation

## 2016-03-23 DIAGNOSIS — I712 Thoracic aortic aneurysm, without rupture: Secondary | ICD-10-CM | POA: Diagnosis not present

## 2016-03-23 DIAGNOSIS — E782 Mixed hyperlipidemia: Secondary | ICD-10-CM | POA: Diagnosis not present

## 2016-03-23 DIAGNOSIS — I739 Peripheral vascular disease, unspecified: Secondary | ICD-10-CM | POA: Insufficient documentation

## 2016-03-23 HISTORY — PX: CARDIOVERSION: SHX1299

## 2016-03-23 SURGERY — CARDIOVERSION
Anesthesia: General

## 2016-03-23 MED ORDER — PROPOFOL 10 MG/ML IV BOLUS
INTRAVENOUS | Status: DC | PRN
  Administered 2016-03-23: 50 mg via INTRAVENOUS

## 2016-03-23 MED ORDER — LIDOCAINE HCL (CARDIAC) 20 MG/ML IV SOLN
INTRAVENOUS | Status: DC | PRN
  Administered 2016-03-23: 60 mg via INTRATRACHEAL

## 2016-03-23 NOTE — Anesthesia Postprocedure Evaluation (Signed)
Anesthesia Post Note  Patient: Sean Willis  Procedure(s) Performed: Procedure(s) (LRB): CARDIOVERSION ([name])  Patient location during evaluation: PACU Anesthesia Type: General Level of consciousness: awake and alert Pain management: pain level controlled Vital Signs Assessment: post-procedure vital signs reviewed and stable Respiratory status: spontaneous breathing, nonlabored ventilation, respiratory function stable and patient connected to nasal cannula oxygen Cardiovascular status: blood pressure returned to baseline and stable Postop Assessment: no signs of nausea or vomiting Anesthetic complications: no       Last Vitals:  Vitals:   03/23/16 0832 03/23/16 0952  BP: (!) 120/95 (!) 126/96  Pulse: 79 (!) 59  Resp: 15 16    Last Pain:  Vitals:   03/23/16 0832  TempSrc: Oral                 Catalina Gravel

## 2016-03-23 NOTE — Transfer of Care (Signed)
Immediate Anesthesia Transfer of Care Note  Patient: Sean Willis  Procedure(s) Performed: Procedure(s): CARDIOVERSION ([name])  Patient Location: Endoscopy Unit  Anesthesia Type:MAC  Level of Consciousness: awake, alert  and oriented  Airway & Oxygen Therapy: Patient Spontanous Breathing and Patient connected to nasal cannula oxygen  Post-op Assessment: Report given to RN and Post -op Vital signs reviewed and stable  Post vital signs: Reviewed and stable  Last Vitals:  Vitals:   03/23/16 0832 03/23/16 0952  BP: (!) 120/95 (!) 126/96  Pulse: 79 (!) 59  Resp: 15 16    Last Pain:  Vitals:   03/23/16 0832  TempSrc: Oral         Complications: No apparent anesthesia complications

## 2016-03-23 NOTE — Discharge Instructions (Signed)
Electrical Cardioversion, Care After °This sheet gives you information about how to care for yourself after your procedure. Your health care provider may also give you more specific instructions. If you have problems or questions, contact your health care provider. °What can I expect after the procedure? °After the procedure, it is common to have: °· Some redness on the skin where the shocks were given. °Follow these instructions at home: °· Do not drive for 24 hours if you were given a medicine to help you relax (sedative). °· Take over-the-counter and prescription medicines only as told by your health care provider. °· Ask your health care provider how to check your pulse. Check it often. °· Rest for 48 hours after the procedure or as told by your health care provider. °· Avoid or limit your caffeine use as told by your health care provider. °Contact a health care provider if: °· You feel like your heart is beating too quickly or your pulse is not regular. °· You have a serious muscle cramp that does not go away. °Get help right away if: °· You have discomfort in your chest. °· You are dizzy or you feel faint. °· You have trouble breathing or you are short of breath. °· Your speech is slurred. °· You have trouble moving an arm or leg on one side of your body. °· Your fingers or toes turn cold or blue. °This information is not intended to replace advice given to you by your health care provider. Make sure you discuss any questions you have with your health care provider. °Document Released: 10/08/2012 Document Revised: 07/22/2015 Document Reviewed: 06/24/2015 °Elsevier Interactive Patient Education © 2017 [employer] ° °

## 2016-03-23 NOTE — Interval H&P Note (Signed)
History and Physical Interval Note:  03/23/2016 8:50 AM  Sean Willis  has presented today for surgery, with the diagnosis of AFIB  The various methods of treatment have been discussed with the patient and family. After consideration of risks, benefits and other options for treatment, the patient has consented to  Procedure(s): CARDIOVERSION ([name]) as a surgical intervention .  The patient's history has been reviewed, patient examined, no change in status, stable for surgery.  I have reviewed the patient's chart and labs.  Questions were answered to the patient's satisfaction.     Fransico Him

## 2016-03-23 NOTE — H&P (View-Only) (Signed)
Primary Care Physician: [name], MD Referring Physician: Dr. Rance Muir Mairena is a 71 y.o. male with a h/o of ASCAD (status post CABG 4 2013 in setting of [place] with initial PTCA of the OM 2 and CABG with LIMA to the LAD, SVG to diagonal 1, SVG to OM1/R1 and SVG to distal RCA), HTN, moderate MR and dyslipidemia.He  was found to be in new onset atrial fibrillation in the last several months and was placed on Xarelto.  He underwent DCCV to NSR on 01/20/2016.  He was seen back  at [facility]  and was doing well maintaining NSR following cardioversion.  He also has a history of dilated aortic root with last echo showing a diameter of 46 mm and he is followed by Dr. [name]. In February, he was hanging a porch swing, climbed up a ladder and went into atrial fibrillation. He came in for an EKG on 2/16 which showed atrial fibrillation with CVR.  He has been in it since then.   He is in the afib clinic, 3/20, being seen by Dr.Turner earlier today and is here to discuss antiarrythmic options.   Today, he denies symptoms of palpitations, chest pain, shortness of breath, orthopnea, PND, lower extremity edema, dizziness, presyncope, syncope, or neurologic sequela. The patient is tolerating medications without difficulties and is otherwise without complaint today.   Past Medical History:  Diagnosis Date  . Acute MI 4/13   S/P CABG X4 Sx done in [place]  . Arrhythmia    Post op a-fib w episodes, several short burst of PAF at cardiac rehab-CHADS score is 1  . BPH (benign prostatic hyperplasia)   . Carotid artery stenosis    Left 15-49 %  . Chest wall pain    Right side, muscular,ortho,MRI. U/S abd ok. On lyrica  . Chronic insomnia   . Colon polyp 10/11  . Coronary artery disease 04/2011   s/p CABG in setting of MI in [place], [place], with initial PTCA of OM2 then CABG with LIMA to LAD, SVG to D1, SVG to OM1,/R1 and SVG to distal RCA  . DDD (degenerative disc  disease), lumbar   . Dilatation of aorta (HCC)    4.5 cm ascending aortic aneurysm per chets ct 05-18-15 epic  . ED (erectile dysfunction)   . GERD (gastroesophageal reflux disease)   . Gout   . Hypertension    since mi in 2013 no htn  . Hypertriglyceridemia   . Kidney stone    uric acid, Dr [name]  renal insufficiency  . Osteoarthritis    of the knee   Past Surgical History:  Procedure Laterality Date  . CARDIOVERSION [name] 01/20/2016   Procedure: CARDIOVERSION;  Surgeon: Thayer Headings, MD;  Location: [facility];  Service: Cardiovascular;  Laterality: [name];  . CATARACT EXTRACTION W/PHACO Right 10/26/2014   Procedure: CATARACT EXTRACTION PHACO AND INTRAOCULAR LENS PLACEMENT ([place]);  Surgeon: [name], MD;  Location: [facility];  Service: Ophthalmology;  Laterality: Right;  LOT PACK: [id number] H US:01:43.0 AP:27.8 CDE:28.68  . COLONOSCOPY WITH PROPOFOL [name] 09/27/2015   Procedure: COLONOSCOPY WITH PROPOFOL;  Surgeon: [name], MD;  Location: [facility];  Service: Endoscopy;  Laterality: [name];  . CORONARY ARTERY BYPASS GRAFT  04/2011   Severe multivessel ASCAD s/p Stemi w PTCA of OM2 then s/p CABG w LIMA to LAD,SVG to OM1 /RI and SVG to Distal RCA   . FINGER SURGERY     left finger for ganglion cyst  .  KIDNEY STONE SURGERY      Current Outpatient Prescriptions  Medication Sig Dispense Refill  . co-enzyme Q-10 30 MG capsule Take 300 mg by mouth daily.    [name] doxepin (SINEQUAN) 25 MG capsule Take 25 mg by mouth at bedtime.    [name] esomeprazole (NEXIUM) 40 MG capsule Take 40 mg by mouth daily as needed.    . fish oil-omega-3 fatty acids 1000 MG capsule Take 4 g by mouth daily.    [name] HYDROcodone-acetaminophen (NORCO) 7.5-325 MG per tablet Take 1 tablet by mouth every 6 (six) hours as needed for moderate pain.    . metoprolol tartrate (LOPRESSOR) 25 MG tablet Take 25 mg by mouth 2 (two) times daily.    . pravastatin (PRAVACHOL) 40 MG tablet Take 40 mg by mouth daily.     .  rivaroxaban (XARELTO) 20 MG TABS tablet Take 1 tablet (20 mg total) by mouth daily with supper. 30 tablet 11  . vitamin B-12 (CYANOCOBALAMIN) 100 MCG tablet Take 100 mcg by mouth daily.    [name] zolpidem (AMBIEN) 10 MG tablet Take 5 mg by mouth at bedtime as needed for sleep.     [name] loratadine (CLARITIN) 10 MG tablet Take 10 mg by mouth daily as needed.     No current facility-administered medications for this encounter.     Allergies  Allergen Reactions  . Celebrex [Celecoxib]     Upset stomach    Social History   Social History  . Marital status: Married    Spouse name: [name]  . Number of children: [name]  . Years of education: [name]   Occupational History  . Not on file.   Social History Main Topics  . Smoking status: Never Smoker  . Smokeless tobacco: Never Used  . Alcohol use Yes     Comment: yes occassionally  . Drug use: No  . Sexual activity: Not on file   Other Topics Concern  . Not on file   Social History Narrative  . No narrative on file    Family History  Problem Relation Age of Onset  . Alzheimer's disease Mother   . Cancer Mother   . CVA Father   . Arrhythmia Father   . Arrhythmia Sister     ROS- All systems are reviewed and negative except as per the HPI above  Physical Exam: Vitals:   03/13/16 1454  BP: 140/84  Pulse: (!) 104  Weight: 192 lb (87.1 kg)  Height: 5\' 9"  (1.753 m)   Wt Readings from Last 3 Encounters:  03/13/16 192 lb (87.1 kg)  03/13/16 191 lb (86.6 kg)  01/30/16 193 lb (87.5 kg)    Labs: Lab Results  Component Value Date   NA 137 03/13/2016   K 5.0 03/13/2016   CL 103 03/13/2016   CO2 23 03/13/2016   GLUCOSE 139 (H) 03/13/2016   BUN 19 03/13/2016   CREATININE 1.42 (H) 03/13/2016   CALCIUM 9.3 03/13/2016   MG 2.1 03/13/2016   Lab Results  Component Value Date   INR 1.2 01/17/2016   Lab Results  Component Value Date   CHOL 146 12/20/2015   HDL 25 (L) 12/20/2015   LDLCALC 83 12/20/2015   TRIG 189 (H) 12/20/2015      GEN- The patient is well appearing, alert and oriented x 3 today.   Head- normocephalic, atraumatic Eyes-  Sclera clear, conjunctiva pink Ears- hearing intact Oropharynx- clear Neck- supple, no JVP Lymph- no cervical lymphadenopathy Lungs- Clear to ausculation bilaterally,  normal work of breathing Heart- Regular rate and rhythm, no murmurs, rubs or gallops, PMI not laterally displaced GI- soft, NT, ND, + BS Extremities- no clubbing, cyanosis, or edema MS- no significant deformity or atrophy Skin- no rash or lesion Psych- euthymic mood, full affect Neuro- strength and sensation are intact  EKG-afib at 104 bpm, qrs int 92 ms, qtc 441 ms Epic records reviewed Echo- 12/21- Study Conclusions  - Left ventricle: The cavity size was normal. Wall thickness was   increased in a pattern of moderate LVH. Systolic function was   normal. The estimated ejection fraction was in the range of 55%   to 60%. Wall motion was normal; there were no regional wall   motion abnormalities. The study is not technically sufficient to   allow evaluation of LV diastolic function. - Aortic valve: Trileaflet. Sclerosis without stenosis. There was   trivial regurgitation. - Aorta: Ascending aortic diameter: 46 mm (S). - Ascending aorta: The ascending aorta was moderately dilated. - Mitral valve: Mildly thickened leaflets . There was moderate   regurgitation. - Left atrium: Moderately dilated. - Atrial septum: Mobile IAS. Small PFO cannot be excluded. - Tricuspid valve: There was trivial regurgitation. - Pulmonary arteries: PA peak pressure: 23 mm Hg (S). - Inferior vena cava: The vessel was normal in size. The   respirophasic diameter changes were in the normal range (>= 50%),   consistent with normal central venous pressure.  Impressions:  - Compared to a prior study in 2015, the ascending aorta now   measures 4.6 cm (up from 4.2 cm). LVEF is unchanged at 55-60%.    Assessment and  Plan: 1. persistent afib Discussed antiarrythmic's with pt and he is not interested in amiodarone CAD r/o 1c agents After a very long discussion re tikosyn, pt felt that he would like to pursue this Bmet/matg obtained and meds sent for review for qtc prolonging drugs with Fuller [place], PharmD review Doxepin was a concern for qtc prolonging potential and pt was planning to discuss to PCP re weaning off(he takes for Sleep) Qtc acceptable range Crcl right at 60(59.63) and dose may have to be reduced when initiated if does not improve  Pt then called back to office and said that he did not want to discuss in front of wife, but she has primary progressive aphagia which can escalate into dementia He felt that he did not want to take any risks with the AAD's because he is her caretaker and he does not want to jeopardize hie health with possible cardiac arrest from ADD He would rather increase BB to metoprolol 50 mg bid and plan for another cardioversion He was advised not sure if it would keep him in rhythm any longer than last time He would almost prefer to live in rate controlled  afib if he fails second cardioversion He has niot missed any Doac x 3 weeks Dccv to be scheduled  F/u in one week f/u cardioversion  Butch Penny C. Oluwatomisin Hustead, [facility] [address] [place], [place] [address] [phone]

## 2016-03-23 NOTE — CV Procedure (Signed)
   Electrical Cardioversion Procedure Note Sean Willis [id number] 18-Sep-1945  Procedure: Electrical Cardioversion Indications:  Atrial Fibrillation  Time Out: Verified patient identification, verified procedure,medications/allergies/relevent history reviewed, required imaging and test results available.  Performed  Procedure Details  The patient was NPO after midnight. Anesthesia was administered at the beside  by Dr.Turk with 50mg  of propofol and Lidocaine 60mg .  Cardioversion was done with synchronized biphasic defibrillation with AP pads with 150watts.  The patient converted to normal sinus rhythm. The patient tolerated the procedure well   IMPRESSION:  Successful cardioversion of atrial fibrillation    Sean Willis 03/23/2016, 8:51 AM

## 2016-03-23 NOTE — Anesthesia Preprocedure Evaluation (Addendum)
Anesthesia Evaluation  Patient identified by MRN, date of birth, ID band Patient awake    Reviewed: Allergy & Precautions, NPO status , Patient's Chart, lab work & pertinent test results, reviewed documented beta blocker date and time   Airway Mallampati: II  TM Distance: >3 FB Neck ROM: Full    Dental  (+) Teeth Intact, Dental Advisory Given   Pulmonary neg pulmonary ROS,    Pulmonary exam normal breath sounds clear to auscultation       Cardiovascular hypertension, Pt. on home beta blockers + CAD, + Past MI, + CABG and + Peripheral Vascular Disease  + dysrhythmias Atrial Fibrillation  Rhythm:Irregular Rate:Normal     Neuro/Psych negative neurological ROS  negative psych ROS   GI/Hepatic Neg liver ROS, GERD  Medicated,  Endo/Other  negative endocrine ROS  Renal/GU Renal InsufficiencyRenal disease     Musculoskeletal  (+) Arthritis ,   Abdominal   Peds  Hematology  (+) Blood dyscrasia (Xarelto), ,   Anesthesia Other Findings Day of surgery medications reviewed with the patient.  Reproductive/Obstetrics                             Anesthesia Physical Anesthesia Plan  ASA: III  Anesthesia Plan: General   Post-op Pain Management:    Induction: Intravenous  Airway Management Planned: Mask  Additional Equipment:   Intra-op Plan:   Post-operative Plan:   Informed Consent: I have reviewed the patients History and Physical, chart, labs and discussed the procedure including the risks, benefits and alternatives for the proposed anesthesia with the patient or authorized representative who has indicated his/her understanding and acceptance.   Dental advisory given  Plan Discussed with: CRNA  Anesthesia Plan Comments:         Anesthesia Quick Evaluation

## 2016-03-29 ENCOUNTER — Encounter (HOSPITAL_COMMUNITY): Payer: Self-pay | Admitting: Nurse Practitioner

## 2016-03-29 ENCOUNTER — Ambulatory Visit: Payer: Medicare Other | Admitting: Cardiology

## 2016-03-29 ENCOUNTER — Ambulatory Visit (HOSPITAL_COMMUNITY)
Admission: RE | Admit: 2016-03-29 | Discharge: 2016-03-29 | Disposition: A | Discharge status: Home / Self Care | Payer: Medicare Other | Source: Ambulatory Visit | Attending: Nurse Practitioner | Admitting: Nurse Practitioner

## 2016-03-29 VITALS — BP 124/78 | HR 61 | Wt 190.2 lb

## 2016-03-29 DIAGNOSIS — E781 Pure hyperglyceridemia: Secondary | ICD-10-CM | POA: Diagnosis not present

## 2016-03-29 DIAGNOSIS — Z9889 Other specified postprocedural states: Secondary | ICD-10-CM | POA: Insufficient documentation

## 2016-03-29 DIAGNOSIS — Z86010 Personal history of colonic polyps: Secondary | ICD-10-CM | POA: Diagnosis not present

## 2016-03-29 DIAGNOSIS — N529 Male erectile dysfunction, unspecified: Secondary | ICD-10-CM | POA: Diagnosis not present

## 2016-03-29 DIAGNOSIS — Z87442 Personal history of urinary calculi: Secondary | ICD-10-CM | POA: Diagnosis not present

## 2016-03-29 DIAGNOSIS — Z951 Presence of aortocoronary bypass graft: Secondary | ICD-10-CM | POA: Diagnosis not present

## 2016-03-29 DIAGNOSIS — I481 Persistent atrial fibrillation: Secondary | ICD-10-CM

## 2016-03-29 DIAGNOSIS — I252 Old myocardial infarction: Secondary | ICD-10-CM | POA: Insufficient documentation

## 2016-03-29 DIAGNOSIS — Z823 Family history of stroke: Secondary | ICD-10-CM | POA: Diagnosis not present

## 2016-03-29 DIAGNOSIS — Z7901 Long term (current) use of anticoagulants: Secondary | ICD-10-CM | POA: Diagnosis not present

## 2016-03-29 DIAGNOSIS — I4819 Other persistent atrial fibrillation: Secondary | ICD-10-CM

## 2016-03-29 DIAGNOSIS — I10 Essential (primary) hypertension: Secondary | ICD-10-CM | POA: Diagnosis not present

## 2016-03-29 DIAGNOSIS — K219 Gastro-esophageal reflux disease without esophagitis: Secondary | ICD-10-CM | POA: Insufficient documentation

## 2016-03-29 DIAGNOSIS — N400 Enlarged prostate without lower urinary tract symptoms: Secondary | ICD-10-CM | POA: Insufficient documentation

## 2016-03-29 DIAGNOSIS — I2510 Atherosclerotic heart disease of native coronary artery without angina pectoris: Secondary | ICD-10-CM | POA: Diagnosis not present

## 2016-03-29 NOTE — Progress Notes (Addendum)
Primary Care Physician: [name], MD Referring Physician: Dr. Rance Muir Sean Willis is a 70 y.o. male with a h/o of ASCAD (status post CABG 4 2013 in setting of [place] with initial PTCA of the OM 2 and CABG with LIMA to the LAD, SVG to diagonal 1, SVG to OM1/R1 and SVG to distal RCA), HTN, moderate MR and dyslipidemia.He  was found to be in new onset atrial fibrillation in the last several months and was placed on Xarelto.  He underwent DCCV to NSR on 01/20/2016.  He was seen back  at [facility]  and was doing well maintaining NSR following cardioversion.  He also has a history of dilated aortic root with last echo showing a diameter of 46 mm and he is followed by Dr. [name]. In February, he was hanging a porch swing, climbed up a ladder and went into atrial fibrillation. He came in for an EKG on 2/16 which showed atrial fibrillation with CVR.  He has been in it since then.   He is in the afib clinic, 3/20, being seen by Dr.Turner earlier today and is here to discuss antiarrythmic options.   F/u successful cardioversion 3/23. 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 He would wish to live in afib if it should return. He says that he does not feel that bad in afib.  Today, he denies symptoms of palpitations, chest pain, shortness of breath, orthopnea, PND, lower extremity edema, dizziness, presyncope, syncope, or neurologic sequela. The patient is tolerating medications without difficulties and is otherwise without complaint today.   Past Medical History:  Diagnosis Date  . Acute MI 4/13   S/P CABG X4 Sx done in [place]  . Arrhythmia    Post op a-fib w episodes, several short burst of PAF at cardiac rehab-CHADS score is 1  . BPH (benign prostatic hyperplasia)   . Carotid artery stenosis    Left 15-49 %  . Chest wall pain    Right side, muscular,ortho,MRI. U/S abd ok. On lyrica  . Chronic insomnia   . Colon polyp 10/11  . Coronary artery disease 04/2011   s/p CABG in setting of MI in [place], [place], with initial PTCA of OM2 then CABG with LIMA to LAD, SVG to D1, SVG to OM1,/R1 and SVG to distal RCA  . DDD (degenerative disc disease), lumbar   . Dilatation of aorta (HCC)    4.5 cm ascending aortic aneurysm per chets ct 05-18-15 epic  . ED (erectile dysfunction)   . GERD (gastroesophageal reflux disease)   . Gout   . Hypertension    since mi in 2013 no htn  . Hypertriglyceridemia   . Kidney stone    uric acid, Dr [name]  renal insufficiency  . Osteoarthritis    of the knee   Past Surgical History:  Procedure Laterality Date  . CARDIOVERSION [name] 01/20/2016   Procedure: CARDIOVERSION;  Surgeon: Thayer Headings, MD;  Location: [facility];  Service: Cardiovascular;  Laterality: [name];  . CARDIOVERSION [name] 03/23/2016   Procedure: CARDIOVERSION;  Surgeon: Sueanne Margarita, MD;  Location: [facility];  Service: Cardiovascular;  Laterality: [name];  . CATARACT EXTRACTION W/PHACO Right 10/26/2014   Procedure: CATARACT EXTRACTION PHACO AND INTRAOCULAR LENS PLACEMENT ([place]);  Surgeon: [name], MD;  Location: [facility];  Service: Ophthalmology;  Laterality: Right;  LOT PACK: [id number]  US:01:43.0 AP:27.8 CDE:28.68  . COLONOSCOPY WITH PROPOFOL [name] 09/27/2015   Procedure: COLONOSCOPY WITH PROPOFOL;  Surgeon: [name], MD;  Location: [facility];  Service: Endoscopy;  Laterality: [name];  . CORONARY ARTERY BYPASS GRAFT  04/2011   Severe multivessel ASCAD s/p Stemi w PTCA of OM2 then s/p CABG w LIMA to LAD,SVG to OM1 /RI and SVG to Distal RCA   . FINGER SURGERY     left finger for ganglion cyst  . KIDNEY STONE SURGERY      Current Outpatient Prescriptions  Medication Sig Dispense Refill  . co-enzyme Q-10 30 MG capsule Take 300 mg by mouth daily.    [name] doxepin (SINEQUAN) 25 MG capsule Take 25 mg by mouth at  bedtime.    [name] esomeprazole (NEXIUM) 40 MG capsule Take 40 mg by mouth daily as needed.    . fish oil-omega-3 fatty acids 1000 MG capsule Take 4 g by mouth daily.    [name] HYDROcodone-acetaminophen (NORCO) 7.5-325 MG per tablet Take 1 tablet by mouth every 6 (six) hours as needed for moderate pain.    . metoprolol tartrate (LOPRESSOR) 50 MG tablet Take 1 tablet (50 mg total) by mouth 2 (two) times daily. 60 tablet 3  . pravastatin (PRAVACHOL) 80 MG tablet Take 1 tablet (80 mg total) by mouth daily. 30 tablet 9  . rivaroxaban (XARELTO) 20 MG TABS tablet Take 1 tablet (20 mg total) by mouth daily with supper. 30 tablet 11  . vitamin B-12 (CYANOCOBALAMIN) 100 MCG tablet Take 100 mcg by mouth daily.    [name] zolpidem (AMBIEN) 10 MG tablet Take 5 mg by mouth at bedtime as needed for sleep.     [name] loratadine (CLARITIN) 10 MG tablet Take 10 mg by mouth daily as needed.     No current facility-administered medications for this encounter.     Allergies  Allergen Reactions  . Celebrex [Celecoxib]     Upset stomach    Social History   Social History  . Marital status: Married    Spouse name: [name]  . Number of children: [name]  . Years of education: [name]   Occupational History  . Not on file.   Social History Main Topics  . Smoking status: Never Smoker  . Smokeless tobacco: Never Used  . Alcohol use Yes     Comment: yes occassionally  . Drug use: No  . Sexual activity: Not on file   Other Topics Concern  . Not on file   Social History Narrative  . No narrative on file    Family History  Problem Relation Age of Onset  . Alzheimer's disease Mother   . Cancer Mother   . CVA Father   . Arrhythmia Father   . Arrhythmia Sister     ROS- All systems are reviewed and negative except as per the HPI above  Physical Exam: Vitals:   03/29/16 0909  BP: 124/78  Pulse: 61  SpO2: 97%  Weight: 190 lb 4 oz (86.3 kg)   Wt Readings from Last 3 Encounters:  03/29/16 190 lb 4 oz (86.3 kg)  03/23/16  185 lb (83.9 kg)  03/13/16 192 lb (87.1 kg)    Labs: Lab Results  Component Value Date   NA 137 03/13/2016   K 5.0 03/13/2016   CL 103 03/13/2016   CO2 23 03/13/2016   GLUCOSE 139 (H) 03/13/2016   BUN 19 03/13/2016   CREATININE 1.42 (H) 03/13/2016   CALCIUM 9.3 03/13/2016   MG 2.1  03/13/2016   Lab Results  Component Value Date   INR 1.2 01/17/2016   Lab Results  Component Value Date   CHOL 169 03/14/2016   HDL 29 (L) 03/14/2016   LDLCALC 91 03/14/2016   TRIG 246 (H) 03/14/2016     GEN- The patient is well appearing, alert and oriented x 3 today.   Head- normocephalic, atraumatic Eyes-  Sclera clear, conjunctiva pink Ears- hearing intact Oropharynx- clear Neck- supple, no JVP Lymph- no cervical lymphadenopathy Lungs- Clear to ausculation bilaterally, normal work of breathing Heart- Regular rate and rhythm, no murmurs, rubs or gallops, PMI not laterally displaced GI- soft, NT, ND, + BS Extremities- no clubbing, cyanosis, or edema MS- no significant deformity or atrophy Skin- no rash or lesion Psych- euthymic mood, full affect Neuro- strength and sensation are intact  EKG- NSR at 60 bpm, pr int 158 ms, qrs int 88 ms, qtc 438 ms Epic records reviewed Echo- 12/21- Study Conclusions  - Left ventricle: The cavity size was normal. Wall thickness was   increased in a pattern of moderate LVH. Systolic function was   normal. The estimated ejection fraction was in the range of 55%   to 60%. Wall motion was normal; there were no regional wall   motion abnormalities. The study is not technically sufficient to   allow evaluation of LV diastolic function. - Aortic valve: Trileaflet. Sclerosis without stenosis. There was   trivial regurgitation. - Aorta: Ascending aortic diameter: 46 mm (S). - Ascending aorta: The ascending aorta was moderately dilated. - Mitral valve: Mildly thickened leaflets . There was moderate   regurgitation. - Left atrium: Moderately dilated. -  Atrial septum: Mobile IAS. Small PFO cannot be excluded. - Tricuspid valve: There was trivial regurgitation. - Pulmonary arteries: PA peak pressure: 23 mm Hg (S). - Inferior vena cava: The vessel was normal in size. The   respirophasic diameter changes were in the normal range (>= 50%),   consistent with normal central venous pressure.  Impressions:  - Compared to a prior study in 2015, the ascending aorta now   measures 4.6 cm (up from 4.2 cm). LVEF is unchanged at 55-60%.    Assessment and Plan: 1. 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 as scheduled 6/4  Sean Willis, [facility] [address] [place], [place] [address] [phone]

## 2016-04-09 DIAGNOSIS — H9202 Otalgia, left ear: Secondary | ICD-10-CM | POA: Diagnosis not present

## 2016-04-12 ENCOUNTER — Other Ambulatory Visit: Payer: Self-pay | Admitting: Surgery

## 2016-04-12 DIAGNOSIS — I712 Thoracic aortic aneurysm, without rupture: Secondary | ICD-10-CM

## 2016-04-12 DIAGNOSIS — I7121 Aneurysm of the ascending aorta, without rupture: Secondary | ICD-10-CM

## 2016-04-17 ENCOUNTER — Other Ambulatory Visit (HOSPITAL_COMMUNITY): Payer: Self-pay | Admitting: *Deleted

## 2016-04-17 MED ORDER — METOPROLOL TARTRATE 50 MG PO TABS: 50.0000 mg | ORAL_TABLET | Freq: Two times a day (BID) | ORAL | 3 refills | Status: DC

## 2016-04-22 ENCOUNTER — Other Ambulatory Visit: Payer: Self-pay | Admitting: Internal Medicine

## 2016-04-23 ENCOUNTER — Other Ambulatory Visit: Payer: Self-pay | Admitting: *Deleted

## 2016-04-23 MED ORDER — METOPROLOL TARTRATE 50 MG PO TABS: 50.0000 mg | ORAL_TABLET | Freq: Two times a day (BID) | ORAL | 3 refills | Status: DC

## 2016-04-29 ENCOUNTER — Ambulatory Visit (HOSPITAL_BASED_OUTPATIENT_CLINIC_OR_DEPARTMENT_OTHER): Payer: Medicare Other | Attending: Cardiology | Admitting: Cardiology

## 2016-04-29 VITALS — Ht 69.0 in | Wt 183.0 lb

## 2016-04-29 DIAGNOSIS — I480 Paroxysmal atrial fibrillation: Secondary | ICD-10-CM | POA: Insufficient documentation

## 2016-04-29 DIAGNOSIS — G4733 Obstructive sleep apnea (adult) (pediatric): Secondary | ICD-10-CM | POA: Diagnosis not present

## 2016-04-29 DIAGNOSIS — R5383 Other fatigue: Secondary | ICD-10-CM | POA: Insufficient documentation

## 2016-04-30 NOTE — Procedures (Signed)
   Patient Name: Sean Willis, Sean Willis Date: 04/29/2016 Gender: Male D.O.B: [birth year] Age (years): 67 Referring Provider: Fransico Him MD, ABSM Height (inches): 69 Interpreting Physician: Fransico Him MD, ABSM Weight (lbs): 183 RPSGT: Baxter Flattery BMI: 27 MRN: [id number] Neck Size: 16.50  CLINICAL INFORMATION Sleep Study Type: NPSG Indication for sleep study: Excessive Daytime Sleepiness, Fatigue, Snoring, Witnessed Apneas Epworth Sleepiness Score:3    SLEEP STUDY TECHNIQUE As per the AASM Manual for the Scoring of Sleep and Associated Events v2.3 (April 2016) with a hypopnea requiring 4% desaturations.  The channels recorded and monitored were frontal, central and occipital EEG, electrooculogram (EOG), submentalis EMG (chin), nasal and oral airflow, thoracic and abdominal wall motion, anterior tibialis EMG, snore microphone, electrocardiogram, and pulse oximetry.  MEDICATIONS Medications self-administered by patient taken the night of the study : AMBIEN, [place] The study was initiated at 10:08:04 PM and ended at 4:55:05 AM.  Sleep onset time was 61.9 minutes and the sleep efficiency was 61.8%. The total sleep time was 251.6 minutes.  Stage REM latency was 117.0 minutes.  The patient spent 15.70% of the night in stage N1 sleep, 62.19% in stage N2 sleep, 0.00% in stage N3 and 22.11% in REM.  Alpha intrusion was absent.  Supine sleep was 20.07%.  RESPIRATORY PARAMETERS The overall apnea/hypopnea index (AHI) was 33.6 per hour. There were 110 total apneas, including 110 obstructive, 0 central and 0 mixed apneas. There were 31 hypopneas and 5 RERAs.  The AHI during Stage REM sleep was 30.2 per hour.  AHI while supine was 93.9 per hour.  The mean oxygen saturation was 93.46%. The minimum SpO2 during sleep was 87.00%.  Loud snoring was noted during this study.  CARDIAC DATA The 2 lead EKG demonstrated sinus rhythm. The mean heart rate was  50.80 beats per minute. Other EKG findings include: None.  LEG MOVEMENT DATA The total PLMS were 0 with a resulting PLMS index of 0.00. Associated arousal with leg movement index was 0.0 .  IMPRESSIONS - Severe obstructive sleep apnea occurred during this study (AHI = 33.6/h). - No significant central sleep apnea occurred during this study (CAI = 0.0/h). - Mild oxygen desaturation was noted during this study (Min O2 = 87.00%). - The patient snored with Loud snoring volume. - No cardiac abnormalities were noted during this study. - Clinically significant periodic limb movements did not occur during sleep. No significant associated arousals.  DIAGNOSIS - Obstructive Sleep Apnea (327.23 [G47.33 ICD-10])  RECOMMENDATIONS - Therapeutic CPAP titration to determine optimal pressure required to alleviate sleep disordered breathing. - Positional therapy avoiding supine position during sleep. - Avoid alcohol, sedatives and other CNS depressants that may worsen sleep apnea and disrupt normal sleep architecture. - Sleep hygiene should be reviewed to assess factors that may improve sleep quality. - Weight management and regular exercise should be initiated or continued if appropriate.  [place], American Board of Sleep Medicine  ELECTRONICALLY SIGNED ON:  04/30/2016, 5:25 PM [place] PH: [phone]   FX: [phone] [place]

## 2016-05-01 ENCOUNTER — Other Ambulatory Visit: Payer: Medicare Other | Admitting: *Deleted

## 2016-05-01 DIAGNOSIS — I77819 Aortic ectasia, unspecified site: Secondary | ICD-10-CM

## 2016-05-01 DIAGNOSIS — I480 Paroxysmal atrial fibrillation: Secondary | ICD-10-CM

## 2016-05-01 DIAGNOSIS — I2510 Atherosclerotic heart disease of native coronary artery without angina pectoris: Secondary | ICD-10-CM | POA: Diagnosis not present

## 2016-05-01 DIAGNOSIS — E782 Mixed hyperlipidemia: Secondary | ICD-10-CM

## 2016-05-01 LAB — LIPID PANEL
CHOL/HDL RATIO: 4.9 ratio (ref 0.0–5.0)
CHOLESTEROL TOTAL: 143 mg/dL (ref 100–199)
HDL: 29 mg/dL — AB (ref >39)
LDL Calculated: 93 mg/dL (ref 0–99)
TRIGLYCERIDES: 104 mg/dL (ref 0–149)
VLDL Cholesterol Cal: 21 mg/dL (ref 5–40)

## 2016-05-01 LAB — HEPATIC FUNCTION PANEL
ALBUMIN: 4.1 g/dL (ref 3.5–4.8)
ALT: 19 IU/L (ref 0–44)
AST: 20 IU/L (ref 0–40)
Alkaline Phosphatase: 82 IU/L (ref 39–117)
Bilirubin Total: 0.6 mg/dL (ref 0.0–1.2)
Bilirubin, Direct: 0.16 mg/dL (ref 0.00–0.40)
TOTAL PROTEIN: 6.5 g/dL (ref 6.0–8.5)

## 2016-05-01 LAB — BASIC METABOLIC PANEL
BUN/Creatinine Ratio: 12 (ref 10–24)
BUN: 14 mg/dL (ref 8–27)
CALCIUM: 8.8 mg/dL (ref 8.6–10.2)
CO2: 21 mmol/L (ref 18–29)
CREATININE: 1.21 mg/dL (ref 0.76–1.27)
Chloride: 101 mmol/L (ref 96–106)
GFR calc Af Amer: 70 mL/min/{1.73_m2} (ref >59)
GFR, EST NON AFRICAN AMERICAN: 60 mL/min/{1.73_m2} (ref >59)
Glucose: 111 mg/dL — ABNORMAL HIGH (ref 65–99)
Potassium: 4 mmol/L (ref 3.5–5.2)
Sodium: 141 mmol/L (ref 134–144)

## 2016-05-01 NOTE — Addendum Note (Signed)
Addended by: Eulis Foster on: 05/01/2016 07:35 AM   Modules accepted: Orders

## 2016-05-02 ENCOUNTER — Telehealth: Payer: Self-pay | Admitting: *Deleted

## 2016-05-02 DIAGNOSIS — G4733 Obstructive sleep apnea (adult) (pediatric): Secondary | ICD-10-CM

## 2016-05-02 NOTE — Telephone Encounter (Signed)
Called results LMTCB 

## 2016-05-02 NOTE — Telephone Encounter (Signed)
-----   Message from Sueanne Margarita, MD sent at 04/30/2016  5:27 PM EDT ----- Please let patient know that they have sleep apnea and recommend CPAP titration. Please set up titration in the sleep lab.

## 2016-05-03 ENCOUNTER — Telehealth: Payer: Self-pay

## 2016-05-03 DIAGNOSIS — E782 Mixed hyperlipidemia: Secondary | ICD-10-CM

## 2016-05-03 MED ORDER — ROSUVASTATIN CALCIUM 20 MG PO TABS
20.0000 mg | ORAL_TABLET | Freq: Every day | ORAL | 3 refills | Status: DC
Start: 2016-05-03 — End: 2017-08-14

## 2016-05-03 NOTE — Telephone Encounter (Addendum)
Informed patient of sleep study results and patient understanding was verbalized. Patient understands [name] recommends a CPAP titration. Patient understands the titration will be done at [facility] sleep lab. Patient understands he will be contacted by mail with a time, date, and directions. Patient understands to call if his letter does not reach him in a timely manner. Patient agrees with treatment plan and thanked me. Titration order placed in epic. Patient understands his test is scheduled for Monday July 02 2016. Patient agrees to treatment plan and thanked me

## 2016-05-03 NOTE — Telephone Encounter (Signed)
-----   Message from Leeroy Bock, [facility] sent at 05/02/2016  7:32 AM EDT ----- Pt currently takes pravastatin 80mg  daily. Would recommend switching to high intensity statin like Crestor 20mg  daily given hx of CAD to help bring LDL to goal < 70. TG much improved from 1 month ago. Recheck lipids in 3 months.

## 2016-05-03 NOTE — Telephone Encounter (Signed)
Informed patient of results and verbal understanding expressed.   Instructed patient to STOP PRAVASTATIN and START CRESTOR 20 mg daily. FLP and ALT scheduled 7/31. Patient agrees with treatment plan.

## 2016-05-08 ENCOUNTER — Encounter: Payer: Self-pay | Admitting: *Deleted

## 2016-05-10 ENCOUNTER — Other Ambulatory Visit: Payer: Medicare Other

## 2016-05-17 ENCOUNTER — Other Ambulatory Visit: Payer: Self-pay | Admitting: *Deleted

## 2016-05-17 ENCOUNTER — Ambulatory Visit (INDEPENDENT_AMBULATORY_CARE_PROVIDER_SITE_OTHER)
Admission: RE | Admit: 2016-05-17 | Discharge: 2016-05-17 | Disposition: A | Discharge status: Home / Self Care | Payer: Medicare Other | Source: Ambulatory Visit | Attending: Cardiology | Admitting: Cardiology

## 2016-05-17 DIAGNOSIS — I77819 Aortic ectasia, unspecified site: Secondary | ICD-10-CM

## 2016-05-17 IMAGING — CT CT ANGIO CHEST
2 of 7 series · 19 of 46 positions shown · IV contrast (isovue)
Comparison: CT scan of [DATE].

CLINICAL DATA: Aortic root dilatation.

EXAM:
CT ANGIOGRAPHY CHEST WITH CONTRAST
TECHNIQUE: Multidetector CT imaging of the chest was performed using the
standard protocol during bolus administration of intravenous
contrast. Multiplanar CT image reconstructions and MIPs were
obtained to evaluate the vascular anatomy.
CONTRAST:  100 mL of Isovue 370 intravenously.

[Series 4: aorta 3.0 i31f 2 · axial · 0.77mm/px · z∈[-320,-11]mm · 16 of 113 slices shown]
[im 5/113  lung]
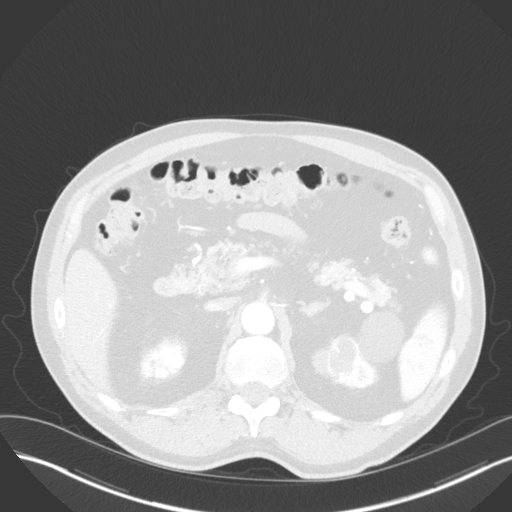
[im 13/113  soft-tissue]
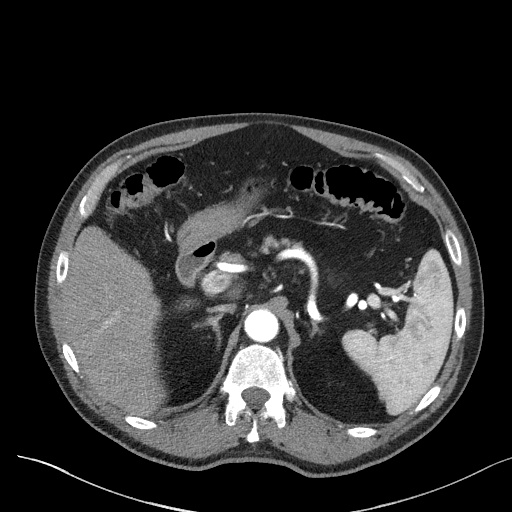
[im 18/113  lung]
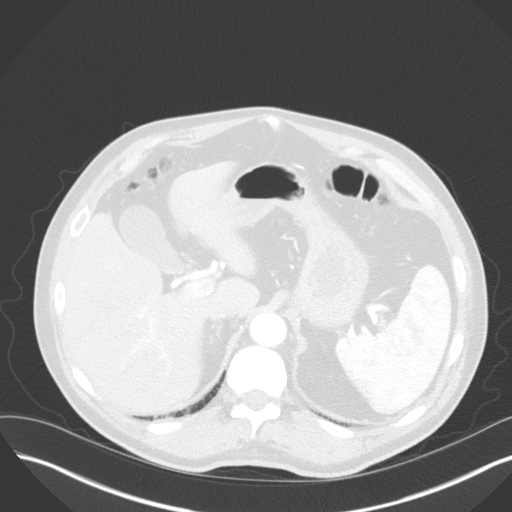
[im 26/113  soft-tissue]
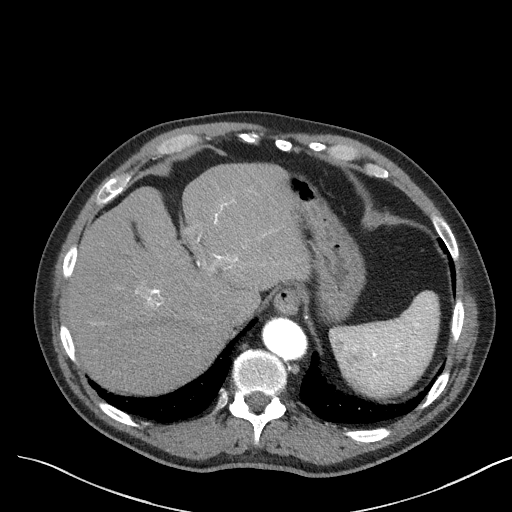
[im 35/113  lung]
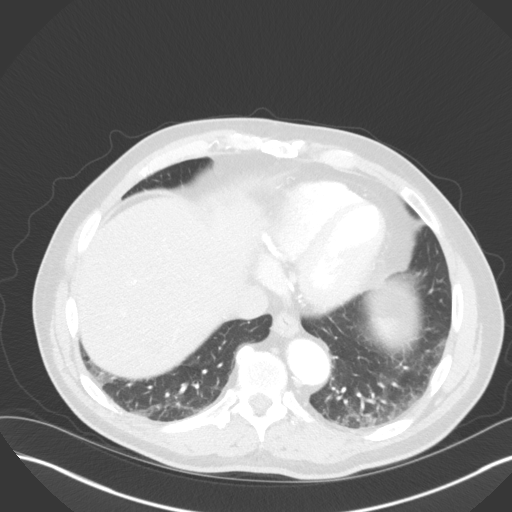
[im 39/113  soft-tissue]
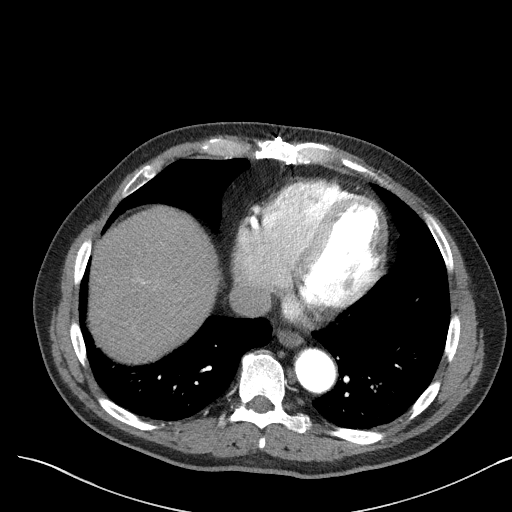
[im 48/113  lung]
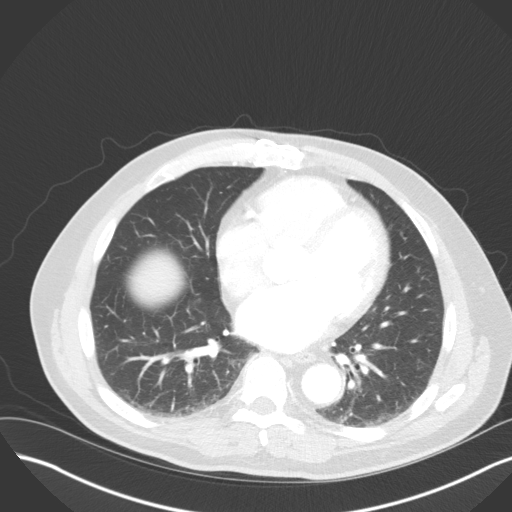
[im 52/113  soft-tissue]
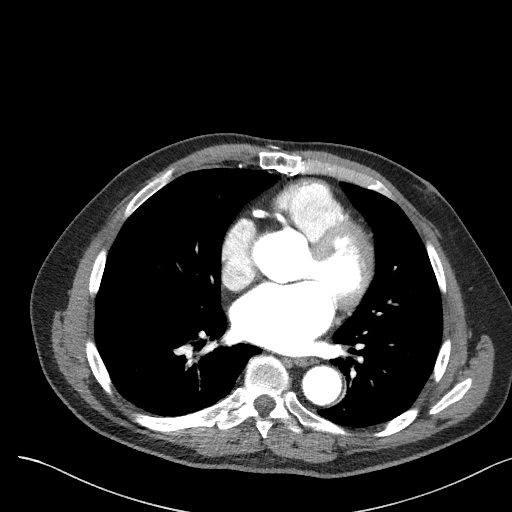
[im 61/113  lung]
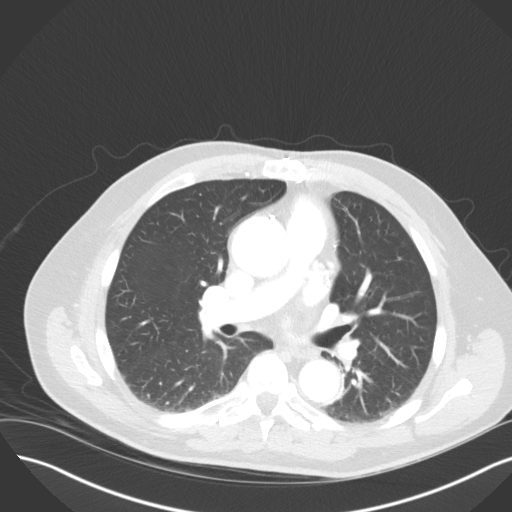
[im 65/113  soft-tissue]
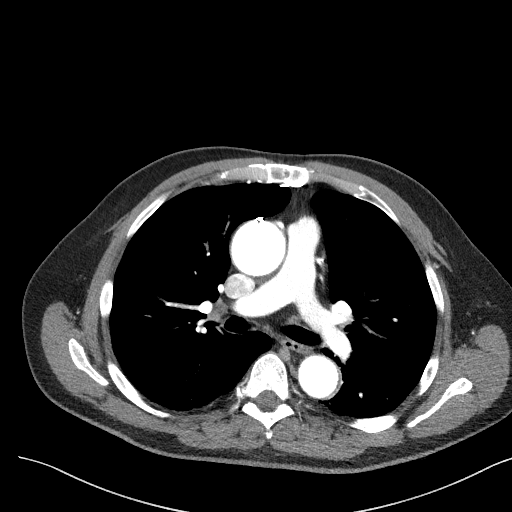
[im 74/113  lung]
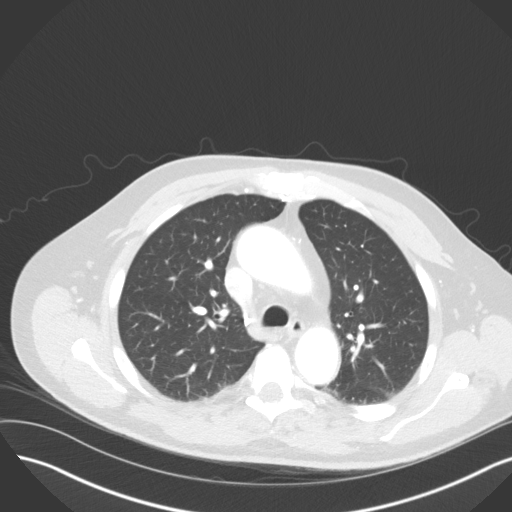
[im 78/113  soft-tissue]
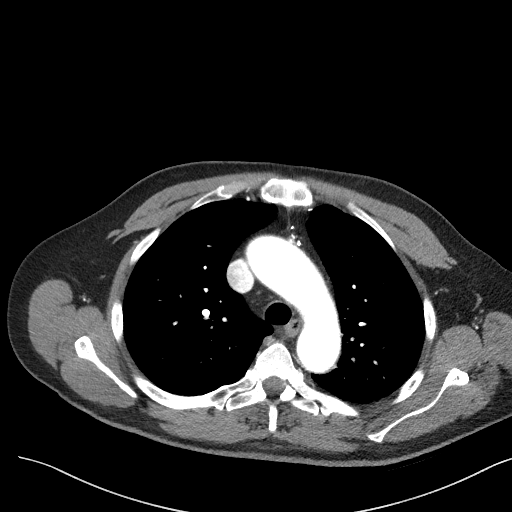
[im 87/113  lung]
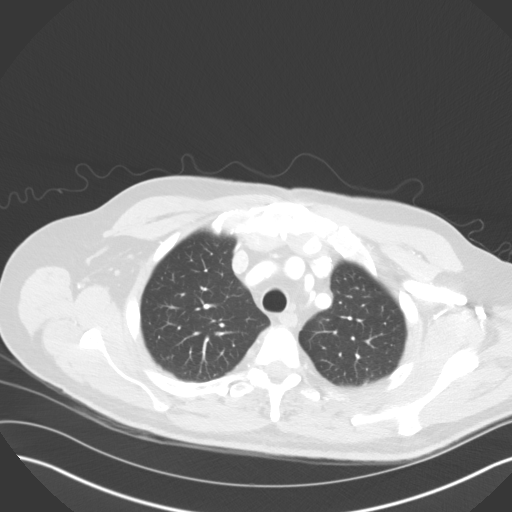
[im 95/113  soft-tissue]
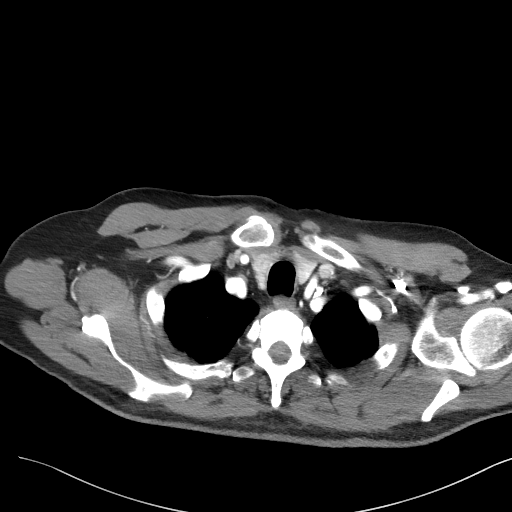
[im 100/113  lung]
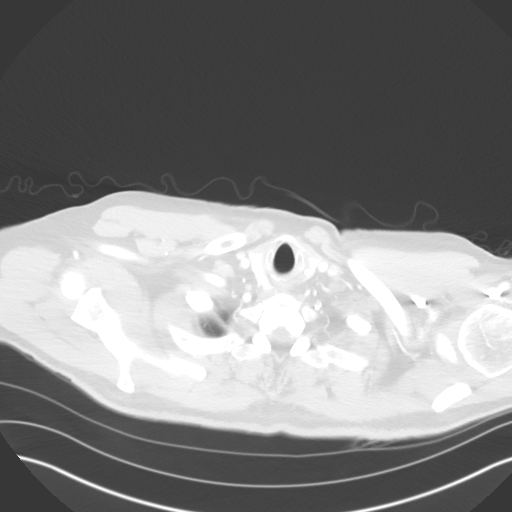
[im 108/113  soft-tissue]
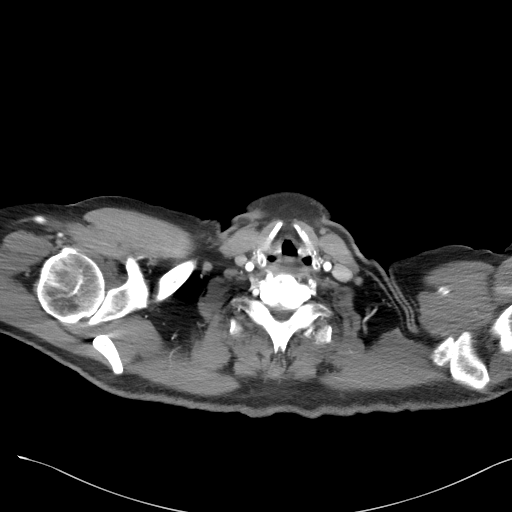

[Series 7: coronals · coronal · 0.67mm/px · 3 of 121 slices shown]
[im 31/121  soft-tissue]
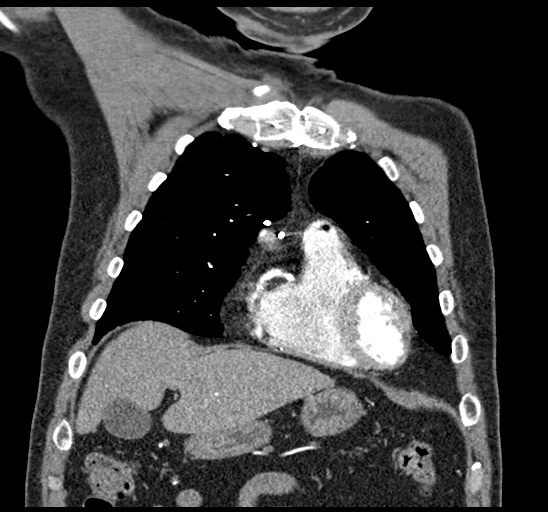
[im 61/121  soft-tissue]
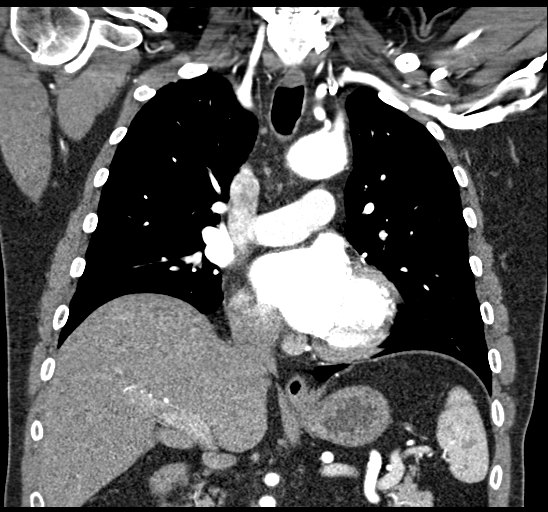
[im 91/121  soft-tissue]
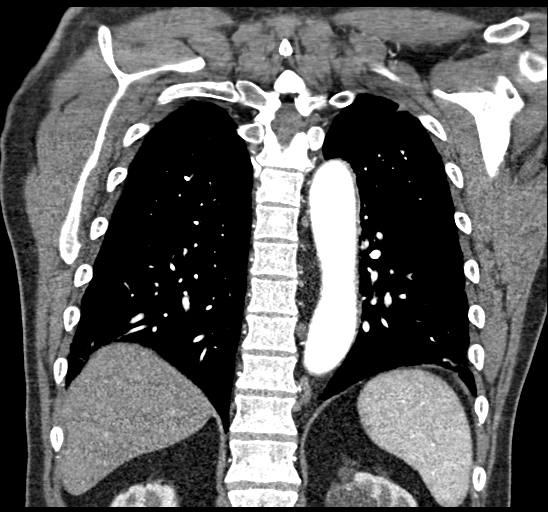

[19 of 46 positions shown; findings below may reference images not displayed]

FINDINGS: Cardiovascular: Aortic root is dilated at 4.5 cm. BENRABAH
junction is measured at 3.7 cm. Ascending thoracic aorta measures
4.3 cm which is not significantly changed compared to prior exam.
Transverse aortic arch measures 2.8 cm which is within normal
limits. Proximal descending thoracic aorta measures 3.4 cm which is
within normal limits. Great vessels are widely patent without
significant stenosis. Status post coronary artery bypass graft. No
pericardial effusion is noted.

Mediastinum/Nodes: No enlarged mediastinal, hilar, or axillary lymph
nodes. Thyroid gland, trachea, and esophagus demonstrate no
significant findings.

Lungs/Pleura: Lungs are clear. No pleural effusion or pneumothorax.

Upper Abdomen: No acute abnormality.

Musculoskeletal: No chest wall abnormality. No acute or significant
osseous findings.

Review of the MIP images confirms the above findings.
IMPRESSION: Aortic root is mildly dilated at 4.5 cm. Grossly stable 4.3 cm
ascending thoracic aortic aneurysm. Recommend annual imaging
followup by CTA or MRA. This recommendation follows [Q4]
ACCF/AHA/AATS/ACR/ASA/SCA/BENRABAH/BENRABAH/BENRABAH/BENRABAH Guidelines for the
Diagnosis and Management of Patients with Thoracic Aortic Disease.
Circulation. [Q4]; 121: [id number].

## 2016-05-17 MED ORDER — IOPAMIDOL (ISOVUE-370) INJECTION 76%
100.0000 mL | Freq: Once | INTRAVENOUS | Status: AC | PRN
  Administered 2016-05-17: 100 mL via INTRAVENOUS

## 2016-05-18 ENCOUNTER — Ambulatory Visit (HOSPITAL_BASED_OUTPATIENT_CLINIC_OR_DEPARTMENT_OTHER): Payer: Medicare Other | Attending: Cardiology | Admitting: Cardiology

## 2016-05-18 ENCOUNTER — Telehealth: Payer: Self-pay | Admitting: Cardiology

## 2016-05-18 VITALS — Ht 69.0 in | Wt 183.0 lb

## 2016-05-18 DIAGNOSIS — G4733 Obstructive sleep apnea (adult) (pediatric): Secondary | ICD-10-CM | POA: Diagnosis not present

## 2016-05-18 DIAGNOSIS — Z79899 Other long term (current) drug therapy: Secondary | ICD-10-CM | POA: Diagnosis not present

## 2016-05-20 NOTE — Procedures (Signed)
   Patient Name: Sean Willis, Sean Willis Date: 05/18/2016 Gender: Male D.O.B: 12/27/1945 Age (years): 84 Referring Provider: Fransico Him MD, ABSM Height (inches): 69 Interpreting Physician: Fransico Him MD, ABSM Weight (lbs): 183 RPSGT: Baxter Flattery BMI: 27 MRN: [id number] Neck Size: 16.50  CLINICAL INFORMATION The patient is referred for a CPAP titration to treat sleep apnea. Date of NPSG, Split Night or HST:04/29/2016  SLEEP STUDY TECHNIQUE As per the AASM Manual for the Scoring of Sleep and Associated Events v2.3 (April 2016) with a hypopnea requiring 4% desaturations.  The channels recorded and monitored were frontal, central and occipital EEG, electrooculogram (EOG), submentalis EMG (chin), nasal and oral airflow, thoracic and abdominal wall motion, anterior tibialis EMG, snore microphone, electrocardiogram, and pulse oximetry. Continuous positive airway pressure (CPAP) was initiated at the beginning of the study and titrated to treat sleep-disordered breathing.  MEDICATIONS Medications self-administered by patient taken the night of the study : AMBIEN, [place] Comments added by technician: Patient had difficulty initiating sleep. Patient was restless all through the night.  Comments added by scorer: [name]  RESPIRATORY PARAMETERS Optimal PAP Pressure (cm): 7  AHI at Optimal Pressure (/hr):5.0 Overall Minimal O2 (%):90.00  Supine % at Optimal Pressure (%): 63 Minimal O2 at Optimal Pressure (%): 90.0    SLEEP ARCHITECTURE The study was initiated at 10:47:54 PM and ended at 5:29:30 AM.  Sleep onset time was 21.1 minutes and the sleep efficiency was 31.4%. The total sleep time was 126.0 minutes.  The patient spent 18.65% of the night in stage N1 sleep, 66.67% in stage N2 sleep, 0.00% in stage N3 and 14.68% in REM.Stage REM latency was 84.5 minutes  Wake after sleep onset was 254.5. Alpha intrusion was absent. Supine sleep was 18.25%.  CARDIAC  DATA The 2 lead EKG demonstrated sinus rhythm. The mean heart rate was 54.40 beats per minute. Other EKG findings include: None.  LEG MOVEMENT DATA The total Periodic Limb Movements of Sleep (PLMS) were 0. The PLMS index was 0.00. A PLMS index of <15 is considered normal in adults.  IMPRESSIONS - The optimal PAP pressure was 7 cm of water. - Central sleep apnea was not noted during this titration (CAI = 1.0/h). - Mild oxygen desaturations were observed during this titration (min O2 = 90.00%). - No snoring was audible during this study. - No cardiac abnormalities were observed during this study. - Clinically significant periodic limb movements were not noted during this study. Arousals associated with PLMs were rare.  DIAGNOSIS - Obstructive Sleep Apnea (327.23 [G47.33 ICD-10])  RECOMMENDATIONS - Trial of CPAP therapy on 7 cm H2O with a Medium size Fisher&Paykel Nasal Mask Eson2 mask and heated humidification. - Avoid alcohol, sedatives and other CNS depressants that may worsen sleep apnea and disrupt normal sleep architecture. - Sleep hygiene should be reviewed to assess factors that may improve sleep quality. - Weight management and regular exercise should be initiated or continued. - Return to Sleep Center for re-evaluation after 10  weeks of therapy  [place], [facility]  ELECTRONICALLY SIGNED ON:  05/20/2016, 10:35 PM [place] PH: [phone]   FX: [phone] [place]

## 2016-05-22 NOTE — Telephone Encounter (Signed)
Informed patient of titration results and he verbalized understanding. Patient understands he will be contacted by [facility] to set up his cpap. He understands to call if [facility] does not contact him with new setup in a timely manner. He understands he will be called once confirmation has been received from [facility] that he has received his new machine to schedule 10 week follow up appointment.  [facility] notified of new cpap order in epic He was grateful for the call and thanked me   

## 2016-05-22 NOTE — Telephone Encounter (Signed)
-----   Message from Sueanne Margarita, MD sent at 05/20/2016 10:38 PM EDT ----- Pt had successful PAP titration. Please setup appointment in 10 weeks. Please let [facility] know that order for PAP is in EPIC.

## 2016-05-23 ENCOUNTER — Other Ambulatory Visit: Payer: Medicare Other

## 2016-05-23 ENCOUNTER — Ambulatory Visit: Payer: Medicare Other | Admitting: Surgery

## 2016-05-29 ENCOUNTER — Ambulatory Visit (HOSPITAL_COMMUNITY)
Admission: RE | Admit: 2016-05-29 | Discharge: 2016-05-29 | Disposition: A | Discharge status: Home / Self Care | Payer: Medicare Other | Source: Ambulatory Visit | Attending: Nurse Practitioner | Admitting: Nurse Practitioner

## 2016-05-29 ENCOUNTER — Encounter (HOSPITAL_COMMUNITY): Payer: Self-pay | Admitting: Nurse Practitioner

## 2016-05-29 VITALS — BP 132/96 | HR 88 | Ht 69.0 in | Wt 189.0 lb

## 2016-05-29 DIAGNOSIS — K219 Gastro-esophageal reflux disease without esophagitis: Secondary | ICD-10-CM | POA: Diagnosis not present

## 2016-05-29 DIAGNOSIS — Z7901 Long term (current) use of anticoagulants: Secondary | ICD-10-CM | POA: Diagnosis not present

## 2016-05-29 DIAGNOSIS — Z87442 Personal history of urinary calculi: Secondary | ICD-10-CM | POA: Diagnosis not present

## 2016-05-29 DIAGNOSIS — M109 Gout, unspecified: Secondary | ICD-10-CM | POA: Insufficient documentation

## 2016-05-29 DIAGNOSIS — N400 Enlarged prostate without lower urinary tract symptoms: Secondary | ICD-10-CM | POA: Insufficient documentation

## 2016-05-29 DIAGNOSIS — I10 Essential (primary) hypertension: Secondary | ICD-10-CM | POA: Insufficient documentation

## 2016-05-29 DIAGNOSIS — N529 Male erectile dysfunction, unspecified: Secondary | ICD-10-CM | POA: Insufficient documentation

## 2016-05-29 DIAGNOSIS — I252 Old myocardial infarction: Secondary | ICD-10-CM | POA: Insufficient documentation

## 2016-05-29 DIAGNOSIS — I712 Thoracic aortic aneurysm, without rupture: Secondary | ICD-10-CM | POA: Insufficient documentation

## 2016-05-29 DIAGNOSIS — Z9889 Other specified postprocedural states: Secondary | ICD-10-CM | POA: Insufficient documentation

## 2016-05-29 DIAGNOSIS — Z951 Presence of aortocoronary bypass graft: Secondary | ICD-10-CM | POA: Insufficient documentation

## 2016-05-29 DIAGNOSIS — I480 Paroxysmal atrial fibrillation: Secondary | ICD-10-CM | POA: Insufficient documentation

## 2016-05-29 DIAGNOSIS — E781 Pure hyperglyceridemia: Secondary | ICD-10-CM | POA: Diagnosis not present

## 2016-05-29 DIAGNOSIS — H6690 Otitis media, unspecified, unspecified ear: Secondary | ICD-10-CM | POA: Insufficient documentation

## 2016-05-29 DIAGNOSIS — I2510 Atherosclerotic heart disease of native coronary artery without angina pectoris: Secondary | ICD-10-CM | POA: Diagnosis not present

## 2016-05-29 NOTE — Telephone Encounter (Signed)
See if he is willing to go on Auto CPAP from 4 to 18cm H2O.[name]

## 2016-05-29 NOTE — Telephone Encounter (Signed)
Spoke to patient and he complains that his machine starts out on 5cm then in 30 minutes it goes to 7 cm and stays on 7 cm all night. He has not been able to sleep for the last 3 nights, he is exhausted and he believes he is back in afib  because of it. He would like his machine set on 5 cm and leave it there for a few weeks until he gets use to it and then try to move it back to 7 cm.   To Dr Radford Pax for instructions

## 2016-05-29 NOTE — Progress Notes (Signed)
Primary Care Physician: [name], MD Referring Physician: Dr. Rance Muir Dass is a 71 y.o. male with a h/o of ASCAD (status post CABG 4 2013 in setting of [place] with initial PTCA of the OM 2 and CABG with LIMA to the LAD, SVG to diagonal 1, SVG to OM1/R1 and SVG to distal RCA), HTN, moderate MR and dyslipidemia.He  was found to be in new onset atrial fibrillation in the last several months and was placed on Xarelto.  He underwent DCCV to NSR on 01/20/2016.  He was seen back  at [facility]  and was doing well maintaining NSR following cardioversion.  He also has a history of dilated aortic root with last echo showing a diameter of 46 mm and he is followed by Dr. [name]. In February, he was hanging a porch swing, climbed up a ladder and went into atrial fibrillation. He came in for an EKG on 2/16 which showed atrial fibrillation with CVR.  He has been in it since then.   He is in the afib clinic, 3/20, being seen by Dr.Turner earlier today and is here to discuss antiarrythmic options.   F/u successful cardioversion 3/23. 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 He would wish to live in afib if it should return. He says that he does not feel that bad in afib.  Pt being seen 5/29 for going back into afib this am. His would have not known it if he had not picked up by HR on his watch.He is not symptomatic. I have had conversations with the pt in the past and he deferred antiarrythmic's due to his concerns he would have an adverse affect and would not be able to care for his wife. He has also been trying to get used to cpap but the pressure is bothering him and he has been trying to reach [name] to further discuss. He has only slept a few hours the last 3 nights and think this triggered his afib.  Today, he denies symptoms of  palpitations, chest pain, shortness of breath, orthopnea, PND, lower extremity edema, dizziness, presyncope, syncope, or neurologic sequela. The patient is tolerating medications without difficulties and is otherwise without complaint today.   Past Medical History:  Diagnosis Date  . Acute MI ([place]) 4/13   S/P CABG X4 Sx done in [place]  . Arrhythmia    Post op a-fib w episodes, several short burst of PAF at cardiac rehab-CHADS score is 1  . BPH (benign prostatic hyperplasia)   . Carotid artery stenosis    Left 15-49 %  . Chest wall pain    Right side, muscular,ortho,MRI. U/S abd ok. On lyrica  . Chronic insomnia   . Colon polyp 10/11  . Coronary artery disease 04/2011   s/p CABG in setting of MI in [place], [place], with initial PTCA of OM2 then CABG with LIMA to LAD, SVG to D1, SVG to OM1,/R1 and SVG to distal RCA  . DDD (degenerative disc disease), lumbar   . Dilatation of aorta (HCC)    4.5 cm ascending aortic aneurysm per chets ct 05-18-15 epic  . ED (erectile dysfunction)   . GERD (gastroesophageal reflux disease)   . Gout   . Hypertension    since mi in 2013 no htn  . Hypertriglyceridemia   . Kidney stone  uric acid, Dr [name]  renal insufficiency  . Osteoarthritis    of the knee   Past Surgical History:  Procedure Laterality Date  . CARDIOVERSION [name] 01/20/2016   Procedure: CARDIOVERSION;  Surgeon: Thayer Headings, MD;  Location: [facility];  Service: Cardiovascular;  Laterality: [name];  . CARDIOVERSION [name] 03/23/2016   Procedure: CARDIOVERSION;  Surgeon: Sueanne Margarita, MD;  Location: [facility] ENDOSCOPY;  Service: Cardiovascular;  Laterality: [name];  . CATARACT EXTRACTION W/PHACO Right 10/26/2014   Procedure: CATARACT EXTRACTION PHACO AND INTRAOCULAR LENS PLACEMENT ([place]);  Surgeon: [name], MD;  Location: [facility] ORS;  Service: Ophthalmology;  Laterality: Right;  LOT PACK: [id number] H US:01:43.0 AP:27.8 CDE:28.68  . COLONOSCOPY WITH PROPOFOL [name] 09/27/2015   Procedure:  COLONOSCOPY WITH PROPOFOL;  Surgeon: [name], MD;  Location: [facility] ENDOSCOPY;  Service: Endoscopy;  Laterality: [name];  . CORONARY ARTERY BYPASS GRAFT  04/2011   Severe multivessel ASCAD s/p Stemi w PTCA of OM2 then s/p CABG w LIMA to LAD,SVG to OM1 /RI and SVG to Distal RCA   . FINGER SURGERY     left finger for ganglion cyst  . KIDNEY STONE SURGERY      Current Outpatient Prescriptions  Medication Sig Dispense Refill  . co-enzyme Q-10 30 MG capsule Take 300 mg by mouth daily.    [name] doxepin (SINEQUAN) 25 MG capsule Take 25 mg by mouth at bedtime.    [name] esomeprazole (NEXIUM) 40 MG capsule Take 40 mg by mouth daily as needed.    . fish oil-omega-3 fatty acids 1000 MG capsule Take 4 g by mouth daily.    [name] HYDROcodone-acetaminophen (NORCO) 7.5-325 MG per tablet Take 1 tablet by mouth every 6 (six) hours as needed for moderate pain.    [name] loratadine (CLARITIN) 10 MG tablet Take 10 mg by mouth daily as needed.    . metoprolol (LOPRESSOR) 50 MG tablet Take 1 tablet (50 mg total) by mouth 2 (two) times daily. 180 tablet 3  . rivaroxaban (XARELTO) 20 MG TABS tablet Take 1 tablet (20 mg total) by mouth daily with supper. 30 tablet 11  . rosuvastatin (CRESTOR) 20 MG tablet Take 1 tablet (20 mg total) by mouth daily. 90 tablet 3  . vitamin B-12 (CYANOCOBALAMIN) 100 MCG tablet Take 100 mcg by mouth daily.    [name] zolpidem (AMBIEN) 10 MG tablet Take 5 mg by mouth at bedtime as needed for sleep.      No current facility-administered medications for this encounter.     Allergies  Allergen Reactions  . Celebrex [Celecoxib]     Upset stomach    Social History   Social History  . Marital status: Married    Spouse name: [name]  . Number of children: [name]  . Years of education: [name]   Occupational History  . Not on file.   Social History Main Topics  . Smoking status: Never Smoker  . Smokeless tobacco: Never Used  . Alcohol use Yes     Comment: yes occassionally  . Drug use: No  . Sexual  activity: Not on file   Other Topics Concern  . Not on file   Social History Narrative  . No narrative on file    Family History  Problem Relation Age of Onset  . Alzheimer's disease Mother   . Cancer Mother   . CVA Father   . Arrhythmia Father   . Arrhythmia Sister     ROS- All systems are reviewed and negative except as per the  HPI above  Physical Exam: Vitals:   05/29/16 1318  BP: (!) 132/96  Pulse: 88  Weight: 189 lb (85.7 kg)  Height: 5\' 9"  (1.753 m)   Wt Readings from Last 3 Encounters:  05/29/16 189 lb (85.7 kg)  05/18/16 183 lb (83 kg)  04/29/16 183 lb (83 kg)    Labs: Lab Results  Component Value Date   NA 141 05/01/2016   K 4.0 05/01/2016   CL 101 05/01/2016   CO2 21 05/01/2016   GLUCOSE 111 (H) 05/01/2016   BUN 14 05/01/2016   CREATININE 1.21 05/01/2016   CALCIUM 8.8 05/01/2016   MG 2.1 03/13/2016   Lab Results  Component Value Date   INR 1.2 01/17/2016   Lab Results  Component Value Date   CHOL 143 05/01/2016   HDL 29 (L) 05/01/2016   LDLCALC 93 05/01/2016   TRIG 104 05/01/2016     GEN- The patient is well appearing, alert and oriented x 3 today.   Head- normocephalic, atraumatic Eyes-  Sclera clear, conjunctiva pink Ears- hearing intact Oropharynx- clear Neck- supple, no JVP Lymph- no cervical lymphadenopathy Lungs- Clear to ausculation bilaterally, normal work of breathing Heart- irregular rate and rhythm, no murmurs, rubs or gallops, PMI not laterally displaced GI- soft, NT, ND, + BS Extremities- no clubbing, cyanosis, or edema MS- no significant deformity or atrophy Skin- no rash or lesion Psych- euthymic mood, full affect Neuro- strength and sensation are intact  EKG- afib at 88 bpm, qrs int 84 ms, qtc 435 ms Epic records reviewed Echo- 12/21- Study Conclusions  - Left ventricle: The cavity size was normal. Wall thickness was   increased in a pattern of moderate LVH. Systolic function was   normal. The estimated  ejection fraction was in the range of 55%   to 60%. Wall motion was normal; there were no regional wall   motion abnormalities. The study is not technically sufficient to   allow evaluation of LV diastolic function. - Aortic valve: Trileaflet. Sclerosis without stenosis. There was   trivial regurgitation. - Aorta: Ascending aortic diameter: 46 mm (S). - Ascending aorta: The ascending aorta was moderately dilated. - Mitral valve: Mildly thickened leaflets . There was moderate   regurgitation. - Left atrium: Moderately dilated. - Atrial septum: Mobile IAS. Small PFO cannot be excluded. - Tricuspid valve: There was trivial regurgitation. - Pulmonary arteries: PA peak pressure: 23 mm Hg (S). - Inferior vena cava: The vessel was normal in size. The   respirophasic diameter changes were in the normal range (>= 50%),   consistent with normal central venous pressure.  Impressions:  - Compared to a prior study in 2015, the ascending aorta now   measures 4.6 cm (up from 4.2 cm). LVEF is unchanged at 55-60%.    Assessment and Plan: 1. Paroxysmal symptomatic rate controlled afib Had cardioversion 3 months ago and had held SR until this am Discussed antiarrythmic's with pt again and he still is not interested in antiarrythmic's He thinks trigger was trying to get used to cpap Wife has primary progressive aphagia which can escalate into dementia He felt that he did not want to take any risks with the AAD's because he is her caretaker and he does not want to jeopardize hie health with possible cardiac arrest from ADD Continue metoprolol at 50 mg bid He is going to [employer] June 8th and will gone one week. He would like to try one more cardioversion after cpap is regulated befor he commits  to an antiarrythmic Continue xarelto 20 mg qd, states no missed doses   F/u [name] as scheduled 6/4 He will let me know when he is ready for cardioversion in the next few weeks  Butch Penny C.  Sharran Caratachea, [facility] [address] [place], [place] [address] [phone]

## 2016-05-29 NOTE — Telephone Encounter (Signed)
Patient calling and requests a call back, would not go into details.

## 2016-05-30 NOTE — Telephone Encounter (Signed)
Patient states he is willing to try that setting

## 2016-05-31 ENCOUNTER — Encounter: Payer: Self-pay | Admitting: Cardiology

## 2016-06-03 NOTE — Progress Notes (Signed)
Cardiology Office Note    Date:  06/04/2016   ID:  Sean Willis, DOB 08-15-1945, MRN [id number]  PCP:  [name], MD  Cardiologist:  Fransico Him, MD   Chief Complaint  Patient presents with  . Coronary Artery Disease  . Hypertension  . Hyperlipidemia  . Atrial Fibrillation    History of Present Illness:  Sean Willis is a 71 y.o. male with history of CAD status post CABG 4 2013 in setting of MI in [place] with initial PTCA of the OM 2 and subsequent CABG with LIMA to the LAD, SVG to diagonal 1, SVG to OM1/R1 and SVG to distal RCA.He also has a history of hypertension, dyslipidemia.  He also has atrial fibrillation and is on Xarelto with a CHADS2VASC score of 3. Patient underwent successful cardioversion 01/20/16. He then reverted back to atrial fibrillation in 02/2016.  He was referred to afib clinic and had successful DCCV on 03/20/2016.  He was seen back on 05/29/2016 and was back in atrial fibrillation.  He did not wand to pursue AAD therapy due to potential sides effects and the fact that he is his wife's primary care giver. He thought that he went back into afib due to trying to get used to his CPAP.  He decided to have one more cardioversion done once he is stable on CPAP.   He also has a moderately dilated aortic root at 46 mm and moderate MR. Question of PFO. He is followed by Dr. [name].    He underwent PSG due to afib which showed severe OSA with an AHI of 33.6/hr and O2 desats of 87%.  He underwent CPAP titration to 7cm H2O.  He has had some problems getting adjusted to his CPAP.  He says that the 1st 2 days he was not able to sleep with it but it was adjusted and he is sleeping better now.  He likes the nasal mask the best and has tolerated it fairly well.  He is feeling more rested in the am and has less daytime sleepiness.    He is here today and is doing well.  He denies any chest pain or pressure, SOB, DOE, PND, orthopnea, dizziness, palpitations  or syncope.  He has no LE edema.    Past Medical History:  Diagnosis Date  . BPH (benign prostatic hyperplasia)   . CAD (coronary artery disease), native coronary artery 04/2011   S/P CABG X4 Sx done in [place]  . Carotid artery stenosis    1-39% bilateral by dopplers 2016  . Chest wall pain    Right side, muscular,ortho,MRI. U/S abd ok. On lyrica  . Chronic insomnia   . Colon polyp 10/11  . Coronary artery disease 04/2011   s/p CABG in setting of MI in [place], [place], with initial PTCA of OM2 then CABG with LIMA to LAD, SVG to D1, SVG to OM1,/R1 and SVG to distal RCA  . DDD (degenerative disc disease), lumbar   . Dilatation of aorta (HCC)    4.5 cm ascending aortic aneurysm per chets ct 05-18-15 epic  . ED (erectile dysfunction)   . GERD (gastroesophageal reflux disease)   . Gout   . Hypertension    since mi in 2013 no htn  . Hypertriglyceridemia   . Kidney stone    uric acid, Dr [name]  renal insufficiency  . Mitral regurgitation 06/04/2016   Moderate by echo 2017  . Osteoarthritis    of the knee  . Persistent  atrial fibrillation ([place])    s/p DCCV 01/2016.  CHASDS2VASC score is 3    Past Surgical History:  Procedure Laterality Date  . CARDIOVERSION [name] 01/20/2016   Procedure: CARDIOVERSION;  Surgeon: Thayer Headings, MD;  Location: [place];  Service: Cardiovascular;  Laterality: [name];  . CARDIOVERSION [name] 03/23/2016   Procedure: CARDIOVERSION;  Surgeon: Sueanne Margarita, MD;  Location: [facility] ENDOSCOPY;  Service: Cardiovascular;  Laterality: [name];  . CATARACT EXTRACTION W/PHACO Right 10/26/2014   Procedure: CATARACT EXTRACTION PHACO AND INTRAOCULAR LENS PLACEMENT ([place]);  Surgeon: [name], MD;  Location: [facility] ORS;  Service: Ophthalmology;  Laterality: Right;  LOT PACK: [id number] H US:01:43.0 AP:27.8 CDE:28.68  . COLONOSCOPY WITH PROPOFOL [name] 09/27/2015   Procedure: COLONOSCOPY WITH PROPOFOL;  Surgeon: [name], MD;  Location: [facility] ENDOSCOPY;  Service: Endoscopy;   Laterality: [name];  . CORONARY ARTERY BYPASS GRAFT  04/2011   Severe multivessel ASCAD s/p Stemi w PTCA of OM2 then s/p CABG w LIMA to LAD,SVG to OM1 /RI and SVG to Distal RCA   . FINGER SURGERY     left finger for ganglion cyst  . KIDNEY STONE SURGERY      Current Medications: Current Meds  Medication Sig  . co-enzyme Q-10 30 MG capsule Take 300 mg by mouth daily.  [name] doxepin (SINEQUAN) 25 MG capsule Take 25 mg by mouth at bedtime.  [name] esomeprazole (NEXIUM) 40 MG capsule Take 40 mg by mouth daily as needed.  . fish oil-omega-3 fatty acids 1000 MG capsule Take 4 g by mouth daily.  [name] HYDROcodone-acetaminophen (NORCO) 7.5-325 MG per tablet Take 1 tablet by mouth every 6 (six) hours as needed for moderate pain.  . metoprolol (LOPRESSOR) 50 MG tablet Take 1 tablet (50 mg total) by mouth 2 (two) times daily.  . rivaroxaban (XARELTO) 20 MG TABS tablet Take 1 tablet (20 mg total) by mouth daily with supper.  . rosuvastatin (CRESTOR) 20 MG tablet Take 1 tablet (20 mg total) by mouth daily.  . vitamin B-12 (CYANOCOBALAMIN) 100 MCG tablet Take 100 mcg by mouth daily.  [name] zolpidem (AMBIEN) 10 MG tablet Take 5 mg by mouth at bedtime as needed for sleep.     Allergies:   Celebrex [celecoxib]   Social History   Social History  . Marital status: Married    Spouse name: [name]  . Number of children: [name]  . Years of education: [name]   Social History Main Topics  . Smoking status: Never Smoker  . Smokeless tobacco: Never Used  . Alcohol use Yes     Comment: yes occassionally  . Drug use: No  . Sexual activity: Not Asked   Other Topics Concern  . None   Social History Narrative  . None     Family History:  The patient's family history includes Alzheimer's disease in his mother; Arrhythmia in his father and sister; CVA in his father; Cancer in his mother.   ROS:   Please see the history of present illness.    ROS All other systems reviewed and are negative.  No flowsheet data  found.     PHYSICAL EXAM:   VS:  BP 124/82   Pulse 87   Ht 5\' 9"  (1.753 m)   Wt 188 lb (85.3 kg)   SpO2 97%   BMI 27.76 kg/m    GEN: Well nourished, well developed, in no acute distress  HEENT: normal  Neck: no JVD, carotid bruits, or masses Cardiac: RRR; no murmurs, rubs, or gallops,no  edema.  Intact distal pulses bilaterally.  Respiratory:  clear to auscultation bilaterally, normal work of breathing GI: soft, nontender, nondistended, + BS MS: no deformity or atrophy  Skin: warm and dry, no rash Neuro:  Alert and Oriented x 3, Strength and sensation are intact Psych: euthymic mood, full affect  Wt Readings from Last 3 Encounters:  06/04/16 188 lb (85.3 kg)  05/29/16 189 lb (85.7 kg)  05/18/16 183 lb (83 kg)      Studies/Labs Reviewed:   EKG:  EKG is not ordered today.    Recent Labs: 12/20/2015: Hemoglobin 17.1 03/13/2016: Magnesium 2.1 03/22/2016: Platelets 167 05/01/2016: ALT 19; BUN 14; Creatinine, Ser 1.21; Potassium 4.0; Sodium 141   Lipid Panel    Component Value Date/Time   CHOL 143 05/01/2016 0733   CHOL 123 01/14/2013 0923   TRIG 104 05/01/2016 0733   TRIG 141 01/14/2013 0923   HDL 29 (L) 05/01/2016 0733   HDL 34 (L) 01/14/2013 0923   CHOLHDL 4.9 05/01/2016 0733   CHOLHDL 5.8 (H) 12/20/2015 0914   VLDL 38 (H) 12/20/2015 0914   LDLCALC 93 05/01/2016 0733   LDLCALC 61 01/14/2013 0923    Additional studies/ records that were reviewed today include:  none    ASSESSMENT:    1. Coronary artery disease involving native coronary artery of native heart without angina pectoris   2. Benign essential HTN   3. Dilatation of aorta (HCC)   4. Bilateral carotid artery disease ([place])   5. Mixed hyperlipidemia   6. Mitral valve insufficiency, unspecified etiology   7. Persistent atrial fibrillation (HCC)      PLAN:  In order of problems listed above:  1.  ASCAD - s/p CABG in setting of MI in [place], [place], with initial PTCA of OM2 then CABG with LIMA  to LAD, SVG to D1, SVG to OM1,/R1 and SVG to distal RCA. He has not had any anginal symptoms. Continue statin and BB. No ASA due to NOAC.  2.  Dilated ascending aorta - 73mm by recent chest CT. Continue BB and statin. Followed by Dr. [name]  3.  HTN - BP controlled on current meds. Continue BB  4.  Bilateral carotid artery disease (1-39% by dopplers 2016) - continue ASA and statin. Repeat dopplers 12/2016  5.  Hyperlipidemia - LDL goal <70. Continue statin. His last LDL was not at goal and he refused to uptitrate his statin.  It was higher in January and his PCP increased his Pravastatin to 40mg  daily but now on Crestor 20mg  daily.  I will recheck FLP and ALT in July.  He is tolerating the Crestor.  6.  Moderate MR - I will repeat an echo in 1 year to make sure that is has not progressed.  7.  Persistent atrial fibrillation now back in afib s/p DCCV x 2 in the past 5 months with reversion back to afib. His CHADS2VASC score is 3. Continue on Xarelto 20mg  daily.  He is not interested in AAD therapy but would like one more chance at DCCV once he is stable on CPAP.  I will see him back in early July and if doing well on CPAP will set up for DCCV.  8.  OSA - the patient is tolerating PAP therapy well without any problems. The PAP download was reviewed today and showed an AHI of 5.1/hr on 7 cm H2O with 3% compliance in using more than 4 hours nightly.  The patient has been using and benefiting from CPAP  use and will continue to benefit from therapy. He has definitely noticed a significant improvement in how he feels in the past several nights of getting better adjusted to it.  I have encouraged him to try to be more compliant with his device.     Medication Adjustments/Labs and Tests Ordered: Current medicines are reviewed at length with the patient today.  Concerns regarding medicines are outlined above.  Medication changes, Labs and Tests ordered today are listed in the Patient Instructions  below.  There are no Patient Instructions on file for this visit.   Signed, Fransico Him, MD  06/04/2016 8:41 AM    [facility] [place], [place], [place]  [address] Phone: [phone]; Fax: [phone]

## 2016-06-04 ENCOUNTER — Ambulatory Visit (INDEPENDENT_AMBULATORY_CARE_PROVIDER_SITE_OTHER): Payer: Medicare Other | Admitting: Cardiology

## 2016-06-04 ENCOUNTER — Encounter: Payer: Self-pay | Admitting: Cardiology

## 2016-06-04 VITALS — BP 124/82 | HR 87 | Ht 69.0 in | Wt 188.0 lb

## 2016-06-04 DIAGNOSIS — E782 Mixed hyperlipidemia: Secondary | ICD-10-CM

## 2016-06-04 DIAGNOSIS — I481 Persistent atrial fibrillation: Secondary | ICD-10-CM | POA: Diagnosis not present

## 2016-06-04 DIAGNOSIS — I779 Disorder of arteries and arterioles, unspecified: Secondary | ICD-10-CM | POA: Diagnosis not present

## 2016-06-04 DIAGNOSIS — I2510 Atherosclerotic heart disease of native coronary artery without angina pectoris: Secondary | ICD-10-CM

## 2016-06-04 DIAGNOSIS — I77819 Aortic ectasia, unspecified site: Secondary | ICD-10-CM

## 2016-06-04 DIAGNOSIS — I340 Nonrheumatic mitral (valve) insufficiency: Secondary | ICD-10-CM

## 2016-06-04 DIAGNOSIS — I739 Peripheral vascular disease, unspecified: Secondary | ICD-10-CM

## 2016-06-04 DIAGNOSIS — I10 Essential (primary) hypertension: Secondary | ICD-10-CM | POA: Diagnosis not present

## 2016-06-04 DIAGNOSIS — I4819 Other persistent atrial fibrillation: Secondary | ICD-10-CM

## 2016-06-04 HISTORY — DX: Nonrheumatic mitral (valve) insufficiency: I34.0

## 2016-06-04 NOTE — Patient Instructions (Addendum)
Medication Instructions:  Your physician recommends that you continue on your current medications as directed. Please refer to the Current Medication list given to you today.   Labwork: None  Testing/Procedures: No new orders  Follow-Up: Your physician recommends that you schedule a follow-up appointment the second week of July with [name] or her assistant to discuss cardioversion.  Any Other Special Instructions Will Be Listed Below (If Applicable).     If you need a refill on your cardiac medications before your next appointment, please call your pharmacy.

## 2016-06-13 ENCOUNTER — Ambulatory Visit: Payer: Medicare Other | Admitting: Surgery

## 2016-06-25 ENCOUNTER — Encounter: Payer: Self-pay | Admitting: Cardiology

## 2016-06-27 ENCOUNTER — Encounter: Payer: Self-pay | Admitting: Surgery

## 2016-06-27 ENCOUNTER — Ambulatory Visit (INDEPENDENT_AMBULATORY_CARE_PROVIDER_SITE_OTHER): Payer: Medicare Other | Admitting: Surgery

## 2016-06-27 ENCOUNTER — Telehealth: Payer: Self-pay | Admitting: *Deleted

## 2016-06-27 VITALS — BP 120/78 | HR 87 | Resp 20 | Ht 69.0 in | Wt 188.0 lb

## 2016-06-27 DIAGNOSIS — I712 Thoracic aortic aneurysm, without rupture, unspecified: Secondary | ICD-10-CM

## 2016-06-27 DIAGNOSIS — I2510 Atherosclerotic heart disease of native coronary artery without angina pectoris: Secondary | ICD-10-CM

## 2016-06-27 NOTE — Progress Notes (Signed)
HPI:  The patient returns for follow up of a 4.4 cm ascending aortic aneurysm found incidentally on echo and CT scan in 04/2013. I last saw him on 05/18/2015 and CTA at that time showed a stable 4.5 cm ascending aortic aneurysm. Over the past year he has had problems with recurrent atrial fibrillation and has been cardioverted twice to sinus rhythm but eventually returned to atrial fib. He is being followed in the afib clinic. He has declined drug therapy due to potential side effects. He says that his rate is well-controlled and he doesn't even know he is in atrial fib unless he is on a monitor. His exercise capacity is good.  Current Outpatient Prescriptions  Medication Sig Dispense Refill  . co-enzyme Q-10 30 MG capsule Take 300 mg by mouth daily.    [name] doxepin (SINEQUAN) 25 MG capsule Take 25 mg by mouth at bedtime.    [name] esomeprazole (NEXIUM) 40 MG capsule Take 40 mg by mouth daily as needed.    . fish oil-omega-3 fatty acids 1000 MG capsule Take 4 g by mouth daily.    [name] HYDROcodone-acetaminophen (NORCO) 7.5-325 MG per tablet Take 1 tablet by mouth every 6 (six) hours as needed for moderate pain.    . metoprolol (LOPRESSOR) 50 MG tablet Take 1 tablet (50 mg total) by mouth 2 (two) times daily. 180 tablet 3  . rivaroxaban (XARELTO) 20 MG TABS tablet Take 1 tablet (20 mg total) by mouth daily with supper. 30 tablet 11  . rosuvastatin (CRESTOR) 20 MG tablet Take 1 tablet (20 mg total) by mouth daily. 90 tablet 3  . traZODone (DESYREL) 50 MG tablet Take 50 mg by mouth at bedtime.    . vitamin B-12 (CYANOCOBALAMIN) 100 MCG tablet Take 100 mcg by mouth daily.     No current facility-administered medications for this visit.      Physical Exam: BP 120/78   Pulse 87   Resp 20   Ht 5\' 9"  (1.753 m)   Wt 188 lb (85.3 kg)   SpO2 98% Comment: RA  BMI 27.76 kg/m  He looks well Cardiac exam shows an irregular rate and rhythm with normal heart sounds. Lungs are clear  Diagnostic  Tests:  CLINICAL DATA:  Aortic root dilatation.  EXAM: CT ANGIOGRAPHY CHEST WITH CONTRAST  TECHNIQUE: Multidetector CT imaging of the chest was performed using the standard protocol during bolus administration of intravenous contrast. Multiplanar CT image reconstructions and MIPs were obtained to evaluate the vascular anatomy.  CONTRAST:  100 mL of Isovue 370 intravenously.  COMPARISON:  CT scan of May 18, 2015.  FINDINGS: Cardiovascular: Aortic root is dilated at 4.5 cm. Sino-tubular junction is measured at 3.7 cm. Ascending thoracic aorta measures 4.3 cm which is not significantly changed compared to prior exam. Transverse aortic arch measures 2.8 cm which is within normal limits. Proximal descending thoracic aorta measures 3.4 cm which is within normal limits. Great vessels are widely patent without significant stenosis. Status post coronary artery bypass graft. No pericardial effusion is noted.  Mediastinum/Nodes: No enlarged mediastinal, hilar, or axillary lymph nodes. Thyroid gland, trachea, and esophagus demonstrate no significant findings.  Lungs/Pleura: Lungs are clear. No pleural effusion or pneumothorax.  Upper Abdomen: No acute abnormality.  Musculoskeletal: No chest wall abnormality. No acute or significant osseous findings.  Review of the MIP images confirms the above findings.  IMPRESSION: Aortic root is mildly dilated at 4.5 cm. Grossly stable 4.3 cm ascending thoracic aortic aneurysm. Recommend annual  imaging followup by CTA or MRA. This recommendation follows 2010 ACCF/AHA/AATS/ACR/ASA/SCA/SCAI/SIR/STS/SVM Guidelines for the Diagnosis and Management of Patients with Thoracic Aortic Disease. Circulation. 2010; 121: W098-J191.   Electronically Signed   By: Marijo Conception, M.D.   On: 05/17/2016 08:54   Impression:  I have personally reviewed and interpreted his CTA images. He has a stable 4.5 cm aortic root and 4.3 cm fusiform  ascending aortic aneurysm. The STJ is 3.7 cm. This is well below the surgical threshold and I have reviewed the CT images with him and indications for surgery. His BP is under good control on Lopressor. His echo in 12/2015 showed trivial AI and normal LV systolic function.   Plan:  I will plan to see him back in one year with a CTA of the chest. He will continue to follow up with Dr. Lysle Rubens and [name]   I spent 15 minutes performing this established patient evaluation and > 50% of this time was spent face to face counseling and coordinating the care of this patient's aortic aneurysm.    Gaye Pollack, MD [facility] [phone]

## 2016-06-27 NOTE — Telephone Encounter (Signed)
4 week download sent to scan. 05/27/16-06/25/16.

## 2016-07-02 ENCOUNTER — Encounter (HOSPITAL_BASED_OUTPATIENT_CLINIC_OR_DEPARTMENT_OTHER): Payer: Medicare Other

## 2016-07-02 NOTE — Telephone Encounter (Signed)
Informed patient of compliance results and patient understanding was verbalized. Patient states he missed 3 days on  his machine because he was traveling and forgot a part for his machine at home. Informed patient of compliance results and patient understanding was verbalized. Patient understands his settings will not change.

## 2016-07-02 NOTE — Telephone Encounter (Signed)
-----   Message from Sueanne Margarita, MD sent at 06/28/2016  3:15 PM EDT ----- Good AHI and compliance.  Continue current CPAP settings.

## 2016-07-10 DIAGNOSIS — M1990 Unspecified osteoarthritis, unspecified site: Secondary | ICD-10-CM | POA: Diagnosis not present

## 2016-07-10 DIAGNOSIS — I10 Essential (primary) hypertension: Secondary | ICD-10-CM | POA: Diagnosis not present

## 2016-07-10 DIAGNOSIS — E782 Mixed hyperlipidemia: Secondary | ICD-10-CM | POA: Diagnosis not present

## 2016-07-10 DIAGNOSIS — N183 Chronic kidney disease, stage 3 (moderate): Secondary | ICD-10-CM | POA: Diagnosis not present

## 2016-07-10 DIAGNOSIS — R7301 Impaired fasting glucose: Secondary | ICD-10-CM | POA: Diagnosis not present

## 2016-07-10 DIAGNOSIS — I712 Thoracic aortic aneurysm, without rupture: Secondary | ICD-10-CM | POA: Diagnosis not present

## 2016-07-10 DIAGNOSIS — I4891 Unspecified atrial fibrillation: Secondary | ICD-10-CM | POA: Diagnosis not present

## 2016-07-10 DIAGNOSIS — K219 Gastro-esophageal reflux disease without esophagitis: Secondary | ICD-10-CM | POA: Diagnosis not present

## 2016-07-10 DIAGNOSIS — Z125 Encounter for screening for malignant neoplasm of prostate: Secondary | ICD-10-CM | POA: Diagnosis not present

## 2016-07-10 DIAGNOSIS — G4700 Insomnia, unspecified: Secondary | ICD-10-CM | POA: Diagnosis not present

## 2016-07-10 DIAGNOSIS — I25810 Atherosclerosis of coronary artery bypass graft(s) without angina pectoris: Secondary | ICD-10-CM | POA: Diagnosis not present

## 2016-07-10 DIAGNOSIS — Z0000 Encounter for general adult medical examination without abnormal findings: Secondary | ICD-10-CM | POA: Diagnosis not present

## 2016-07-10 DIAGNOSIS — Z1389 Encounter for screening for other disorder: Secondary | ICD-10-CM | POA: Diagnosis not present

## 2016-07-10 DIAGNOSIS — Z1159 Encounter for screening for other viral diseases: Secondary | ICD-10-CM | POA: Diagnosis not present

## 2016-07-16 ENCOUNTER — Ambulatory Visit (INDEPENDENT_AMBULATORY_CARE_PROVIDER_SITE_OTHER): Payer: Medicare Other | Admitting: Physician Assistant

## 2016-07-16 ENCOUNTER — Encounter: Payer: Self-pay | Admitting: Physician Assistant

## 2016-07-16 VITALS — BP 130/88 | HR 68 | Ht 69.5 in | Wt 190.4 lb

## 2016-07-16 DIAGNOSIS — I481 Persistent atrial fibrillation: Secondary | ICD-10-CM

## 2016-07-16 DIAGNOSIS — I77819 Aortic ectasia, unspecified site: Secondary | ICD-10-CM | POA: Diagnosis not present

## 2016-07-16 DIAGNOSIS — I2510 Atherosclerotic heart disease of native coronary artery without angina pectoris: Secondary | ICD-10-CM | POA: Diagnosis not present

## 2016-07-16 DIAGNOSIS — Z9989 Dependence on other enabling machines and devices: Secondary | ICD-10-CM | POA: Diagnosis not present

## 2016-07-16 DIAGNOSIS — E782 Mixed hyperlipidemia: Secondary | ICD-10-CM

## 2016-07-16 DIAGNOSIS — I10 Essential (primary) hypertension: Secondary | ICD-10-CM

## 2016-07-16 DIAGNOSIS — G4733 Obstructive sleep apnea (adult) (pediatric): Secondary | ICD-10-CM

## 2016-07-16 DIAGNOSIS — I4819 Other persistent atrial fibrillation: Secondary | ICD-10-CM

## 2016-07-16 HISTORY — DX: Obstructive sleep apnea (adult) (pediatric): G47.33

## 2016-07-16 NOTE — Patient Instructions (Addendum)
Medication Instructions:  Your physician recommends that you continue on your current medications as directed. Please refer to the Current Medication list given to you today.   Labwork: NONE ORDERED TODAY  Testing/Procedures: NONE ORDERED TODAY  Follow-Up: 09/18/16 @ 9 AM WITH [name]  Any Other Special Instructions Will Be Listed Below (If Applicable).     If you need a refill on your cardiac medications before your next appointment, please call your pharmacy.

## 2016-07-16 NOTE — Progress Notes (Signed)
Cardiology Office Note    Date:  07/16/2016   ID:  Sean Willis, DOB Jan 21, 1945, MRN [id number]  PCP:  Sean Low, MD  Cardiologist: Dr. Radford Willis  Chief Complaint  Patient presents with  . Follow-up    History of Present Illness:  Sean Willis is a 71 y.o. male with history of CAD status post CABG in setting of MI in [place] with initial PTCA of OM 2 then CABG with LIMA to the LAD, SVG to diagonal 1, SVG to OM1/R1, SVG to distal RCA. Also has dilated ascending aorta 45 mm by recent CT followed by Dr. Cyndia Willis, hypertension, persistent atrial fibrillation on Xarelto.  Patient underwent DC CV 2 in the past 5 months with reversion back to atrial fibrillation.CHADS2VASC=3. He is not interested in AAD therapy as he's the primary care giver for his wife. He has OSA and recently had trouble with his CPAP and it's been adjusted.  He comes in today to be set up for another cardioversion. He says primary care changed his sleep medication and he's still not doing well on CPAP. He prefers to wait on cardioversion until his C Pap is better controlled. Overall he says he is doing well with the atrial fibrillation and can do anything he wants. He is golfing and playing basketball this summer in the heat without any difficulty. He denies chest pain, palpitations, dyspnea, dyspnea on exertion, dizziness or presyncope. Occasionally he gets fatigued but overall he thinks he is doing well in A. fib.  Past Medical History:  Diagnosis Date  . BPH (benign prostatic hyperplasia)   . CAD (coronary artery disease), native coronary artery 04/2011   S/P CABG X4 Sx done in [place]  . Carotid artery stenosis    1-39% bilateral by dopplers 2016  . Chest wall pain    Right side, muscular,ortho,MRI. U/S abd ok. On lyrica  . Chronic insomnia   . Colon polyp 10/11  . Coronary artery disease 04/2011   s/p CABG in setting of MI in [place], [place], with initial PTCA of OM2 then CABG with  LIMA to LAD, SVG to D1, SVG to OM1,/R1 and SVG to distal RCA  . DDD (degenerative disc disease), lumbar   . Dilatation of aorta (HCC)    4.5 cm ascending aortic aneurysm per chets ct 05-18-15 epic  . ED (erectile dysfunction)   . GERD (gastroesophageal reflux disease)   . Gout   . Hypertension    since mi in 2013 no htn  . Hypertriglyceridemia   . Kidney stone    uric acid, Dr Sean Willis  renal insufficiency  . Mitral regurgitation 06/04/2016   Moderate by echo 2017  . Osteoarthritis    of the knee  . Persistent atrial fibrillation ([place])    s/p DCCV 01/2016.  CHASDS2VASC score is 3    Past Surgical History:  Procedure Laterality Date  . CARDIOVERSION [name] 01/20/2016   Procedure: CARDIOVERSION;  Surgeon: Sean Headings, MD;  Location: [place];  Service: Cardiovascular;  Laterality: [name];  . CARDIOVERSION [name] 03/23/2016   Procedure: CARDIOVERSION;  Surgeon: Sean Margarita, MD;  Location[facility] ENDOSCOPY;  Service: Cardiovascular;  Laterality: [name];  . CATARACT EXTRACTION W/PHACO Right 10/26/2014   Procedure: CATARACT EXTRACTION PHACO AND INTRAOCULAR LENS PLACEMENT ([place]);  Surgeon: Sean Robson, MD;  Location: [facility]S;  Service: Ophthalmology;  Laterality: Right;  LOT PACK: [id number] H US:01:43.0 AP:27.8 CDE:28.68  . COLONOSCOPY WITH PROPOFOL [name] 09/27/2015   Procedure: COLONOSCOPY WITH PROPOFOL;  Surgeon: Sean Done  Sandria Senter, MD;  Location: [facility];  Service: Endoscopy;  Laterality: [name];  . CORONARY ARTERY BYPASS GRAFT  04/2011   Severe multivessel ASCAD s/p Stemi w PTCA of OM2 then s/p CABG w LIMA to LAD,SVG to OM1 /RI and SVG to Distal RCA   . FINGER SURGERY     left finger for ganglion cyst  . KIDNEY STONE SURGERY      Current Medications: Current Meds  Medication Sig  . co-enzyme Q-10 30 MG capsule Take 300 mg by mouth daily.  [name] doxepin (SINEQUAN) 25 MG capsule Take 25 mg by mouth at bedtime.  [name] esomeprazole (NEXIUM) 40 MG capsule Take 40 mg by mouth daily as needed.  . fish  oil-omega-3 fatty acids 1000 MG capsule Take 4 g by mouth daily.  [name] HYDROcodone-acetaminophen (NORCO) 7.5-325 MG per tablet Take 1 tablet by mouth every 6 (six) hours as needed for moderate pain.  . metoprolol (LOPRESSOR) 50 MG tablet Take 1 tablet (50 mg total) by mouth 2 (two) times daily.  . rivaroxaban (XARELTO) 20 MG TABS tablet Take 1 tablet (20 mg total) by mouth daily with supper.  . rosuvastatin (CRESTOR) 20 MG tablet Take 1 tablet (20 mg total) by mouth daily.  . traZODone (DESYREL) 50 MG tablet Take 50 mg by mouth at bedtime.  . vitamin B-12 (CYANOCOBALAMIN) 100 MCG tablet Take 100 mcg by mouth daily.     Allergies:   Celebrex [celecoxib]   Social History   Social History  . Marital status: Married    Spouse name: [name]  . Number of children: [name]  . Years of education: [name]   Social History Main Topics  . Smoking status: Never Smoker  . Smokeless tobacco: Never Used  . Alcohol use Yes     Comment: yes occassionally  . Drug use: No  . Sexual activity: Not Asked   Other Topics Concern  . None   Social History Narrative  . None     Family History:  The patient's family history includes Alzheimer's disease in his mother; Arrhythmia in his father and sister; CVA in his father; Cancer in his mother.   ROS:   Please see the history of present illness.    Review of Systems  Constitution: Positive for malaise/fatigue.  HENT: Negative.   Cardiovascular: Negative.   Respiratory: Negative.   Endocrine: Negative.   Hematologic/Lymphatic: Negative.   Musculoskeletal: Negative.   Gastrointestinal: Negative.   Genitourinary: Negative.   Neurological: Negative.    All other systems reviewed and are negative.   PHYSICAL EXAM:   VS:  BP 130/88   Pulse 68   Ht 5' 9.5" (1.765 m)   Wt 190 lb 6.4 oz (86.4 kg)   BMI 27.71 kg/m   Physical Exam  GEN: Well nourished, well developed, in no acute distress  Neck: no JVD, carotid bruits, or masses Cardiac:irreg irreg; no  murmurs, rubs, or gallops  Respiratory:  clear to auscultation bilaterally, normal work of breathing GI: soft, nontender, nondistended, + BS Ext: without cyanosis, clubbing, or edema, Good distal pulses bilaterally Neuro:  Alert and Oriented x 3 Psych: euthymic mood, full affect  Wt Readings from Last 3 Encounters:  07/16/16 190 lb 6.4 oz (86.4 kg)  06/27/16 188 lb (85.3 kg)  06/04/16 188 lb (85.3 kg)      Studies/Labs Reviewed:   EKG:  EKG is not ordered today.     Recent Labs: 03/13/2016: Magnesium 2.1 03/22/2016: Hemoglobin 17.9; Platelets 167 05/01/2016: ALT 19;  BUN 14; Creatinine, Ser 1.21; Potassium 4.0; Sodium 141   Lipid Panel    Component Value Date/Time   CHOL 143 05/01/2016 0733   CHOL 123 01/14/2013 0923   TRIG 104 05/01/2016 0733   TRIG 141 01/14/2013 0923   HDL 29 (L) 05/01/2016 0733   HDL 34 (L) 01/14/2013 0923   CHOLHDL 4.9 05/01/2016 0733   CHOLHDL 5.8 (H) 12/20/2015 0914   VLDL 38 (H) 12/20/2015 0914   LDLCALC 93 05/01/2016 0733   LDLCALC 61 01/14/2013 0923    Additional studies/ records that were reviewed today include:   2-D echo 12/2017Study Conclusions   - Left ventricle: The cavity size was normal. Wall thickness was   increased in a pattern of moderate LVH. Systolic function was   normal. The estimated ejection fraction was in the range of 55%   to 60%. Wall motion was normal; there were no regional wall   motion abnormalities. The study is not technically sufficient to   allow evaluation of LV diastolic function. - Aortic valve: Trileaflet. Sclerosis without stenosis. There was   trivial regurgitation. - Aorta: Ascending aortic diameter: 46 mm (S). - Ascending aorta: The ascending aorta was moderately dilated. - Mitral valve: Mildly thickened leaflets . There was moderate   regurgitation. - Left atrium: Moderately dilated. - Atrial septum: Mobile IAS. Small PFO cannot be excluded. - Tricuspid valve: There was trivial regurgitation. -  Pulmonary arteries: PA peak pressure: 23 mm Hg (S). - Inferior vena cava: The vessel was normal in size. The   respirophasic diameter changes were in the normal range (>= 50%),   consistent with normal central venous pressure.   Impressions:   - Compared to a prior study in 2015, the ascending aorta now   measures 4.6 cm (up from 4.2 cm). LVEF is unchanged at 55-60%.       ASSESSMENT:    1. Persistent atrial fibrillation ([place])   2. Coronary artery disease involving native coronary artery of native heart without angina pectoris   3. Benign essential HTN   4. Dilatation of aorta (HCC)   5. Mixed hyperlipidemia   6. OSA on CPAP      PLAN:  In order of problems listed above:  Persistent atrial fibrillation controlled with metoprolol.CHADS2VASC=3. Patient thinks he went back in atrial fibrillation due to uncontrolled sleep apnea. He's had cardioversions twice in the past 5 months. Patient says his sleep apnea is still not controlled and would like to hold off on cardioversion until this is under better control. He is asymptomatic with the atrial fibrillation at this time and can do anything he wants. I think it's reasonable to hold off follow-up with Dr. Radford Willis in 2 months.  CAD status post previous CABG without angina  Hypertension well controlled  Dilated aorta followed by Dr. Cyndia Willis  Mixed hyperlipidemia obtaining labs from primary care.  OSA on CPAP current newly not well controlled secondary to sleep medication change by primary care. Patient would like to hold off on cardioversion until this is under better control.    Medication Adjustments/Labs and Tests Ordered: Current medicines are reviewed at length with the patient today.  Concerns regarding medicines are outlined above.  Medication changes, Labs and Tests ordered today are listed in the Patient Instructions below. Patient Instructions  Medication Instructions:  Your physician recommends that you continue on your  current medications as directed. Please refer to the Current Medication list given to you today.   Labwork: NONE ORDERED TODAY  Testing/Procedures:  NONE ORDERED TODAY  Follow-Up: 09/18/16 @ 9 AM WITH DR. Radford Willis  Any Other Special Instructions Will Be Listed Below (If Applicable).     If you need a refill on your cardiac medications before your next appointment, please call your pharmacy.      Signed, Ermalinda Barrios, PA-C  07/16/2016 8:40 AM    [facility] [address], [place], [place]  [address] Phone: [phone]; Fax: [phone]

## 2016-07-18 ENCOUNTER — Telehealth: Payer: Self-pay | Admitting: Cardiology

## 2016-07-18 DIAGNOSIS — R109 Unspecified abdominal pain: Secondary | ICD-10-CM | POA: Diagnosis not present

## 2016-07-18 DIAGNOSIS — N281 Cyst of kidney, acquired: Secondary | ICD-10-CM | POA: Diagnosis not present

## 2016-07-18 DIAGNOSIS — R3121 Asymptomatic microscopic hematuria: Secondary | ICD-10-CM | POA: Diagnosis not present

## 2016-07-18 DIAGNOSIS — N200 Calculus of kidney: Secondary | ICD-10-CM | POA: Diagnosis not present

## 2016-07-18 NOTE — Telephone Encounter (Signed)
Cardiac CT was ordered in error. Informed patient his next CT will be done next May.  He was grateful for call.  Cardiac CT order cancelled.

## 2016-07-18 NOTE — Telephone Encounter (Signed)
Call patient to schedule for the cardiac ct - he stated that he didn't know he needed this test.   I told him that I will have you to call him to discuss this test.

## 2016-07-31 ENCOUNTER — Other Ambulatory Visit (HOSPITAL_COMMUNITY): Payer: Self-pay | Admitting: Nurse Practitioner

## 2016-07-31 ENCOUNTER — Other Ambulatory Visit: Payer: Medicare Other

## 2016-08-01 DIAGNOSIS — H2512 Age-related nuclear cataract, left eye: Secondary | ICD-10-CM | POA: Diagnosis not present

## 2016-08-03 DIAGNOSIS — E782 Mixed hyperlipidemia: Secondary | ICD-10-CM | POA: Diagnosis not present

## 2016-08-07 ENCOUNTER — Telehealth: Payer: Self-pay | Admitting: *Deleted

## 2016-08-07 NOTE — Telephone Encounter (Signed)
-----   Message from Sueanne Margarita, MD sent at 08/06/2016 10:17 AM EDT ----- Good AHI and compliance.  Continue current CPAP settings.

## 2016-08-07 NOTE — Telephone Encounter (Signed)
Informed patient of compliance results and patient understanding was verbalized. Patient understands his settings will not change. Patient was grateful for the call and thanked me.  

## 2016-09-13 ENCOUNTER — Encounter: Payer: Self-pay | Admitting: Cardiology

## 2016-09-17 NOTE — Progress Notes (Signed)
Cardiology Office Note:    Date:  09/18/2016   ID:  Sean Willis, DOB [birth year], MRN [id number]  PCP:  Sean Low, MD  Cardiologist:  Sean Him, MD   Referring MD: Sean Low, MD   Chief Complaint  Patient presents with  . Coronary Artery Disease  . Hypertension  . Hyperlipidemia  . Atrial Fibrillation    History of Present Illness:    Sean Willis is a 71 y.o. male with a hx of CAD status post CABG 4 2013 in setting of MI in [place] with initial PTCA of the OM 2 and subsequent CABG with LIMA to the LAD, SVG to diagonal 1, SVG to OM1/R1 and SVG to distal RCA.He also has a history of hypertension, dyslipidemia.  He has a history of persistent atrial fibrillation s/p DCCV 01/2016 and 03/2016 and is on Xarelto with a CHADS2VASC score of 3. He had recurrent afib and did not want to pursue AAD therapy due to potential sides effects and the fact that he is his wife's primary care giver.  He is now in permanent atrial fibrillation.   He also has a moderately dilated aortic root at 46 mm and moderate MR. He is followed by Dr. Cyndia Willis. He has severe OSA with an AHI of 33.6/hr and O2 desats of 87%.  He is on CPAP at 7cm H2O.    He is here today for followup and is doing well.  He denies any chest pain or pressure, SOB, DOE, PND, orthopnea, LE edema, dizziness, palpitations or syncope.  He is doing well with his CPAP device and thinks that he has gotten used to it.  He tolerates the nasal mask and feels the pressure is adequate.  Since going on CPAP he feels rested in the am and has no significant daytime sleepiness.  He denies any significant mouth or nasal dryness or nasal congestion.  He does not think that he snores.    Past Medical History:  Diagnosis Date  . BPH (benign prostatic hyperplasia)   . CAD (coronary artery disease), native coronary artery 04/2011   S/P CABG X4 Sx done in [place]  . Carotid artery stenosis    1-39% bilateral by dopplers  2016  . Chest wall pain    Right side, muscular,ortho,MRI. U/S abd ok. On lyrica  . Chronic insomnia   . Colon polyp 10/11  . Coronary artery disease 04/2011   s/p CABG in setting of MI in [place], [place], with initial PTCA of OM2 then CABG with LIMA to LAD, SVG to D1, SVG to OM1,/R1 and SVG to distal RCA  . DDD (degenerative disc disease), lumbar   . Dilatation of aorta (HCC)    4.5 cm ascending aortic aneurysm per chets ct 05-18-15 epic  . ED (erectile dysfunction)   . GERD (gastroesophageal reflux disease)   . Gout   . Hypertension    since mi in 2013 no htn  . Hypertriglyceridemia   . Kidney stone    uric acid, Dr Sean Willis  renal insufficiency  . Mitral regurgitation 06/04/2016   Moderate by echo 2017  . Osteoarthritis    of the knee  . Persistent atrial fibrillation (Sean Willis)    s/p DCCV 01/2016.  CHASDS2VASC score is 3    Past Surgical History:  Procedure Laterality Date  . CARDIOVERSION [name] 01/20/2016   Procedure: CARDIOVERSION;  Surgeon: Sean Headings, MD;  Location: [place];  Service: Cardiovascular;  Laterality: [name];  . CARDIOVERSION [name] 03/23/2016  Procedure: CARDIOVERSION;  Surgeon: Sean Margarita, MD;  Location: [facility] ENDOSCOPY;  Service: Cardiovascular;  Laterality: [name];  . CATARACT EXTRACTION W/PHACO Right 10/26/2014   Procedure: CATARACT EXTRACTION PHACO AND INTRAOCULAR LENS PLACEMENT ([place]);  Surgeon: [name], MD;  Location: [facility] ORS;  Service: Ophthalmology;  Laterality: Right;  LOT PACK: [id number] H US:01:43.0 AP:27.8 CDE:28.68  . COLONOSCOPY WITH PROPOFOL [name] 09/27/2015   Procedure: COLONOSCOPY WITH PROPOFOL;  Surgeon: [name], MD;  Location: [facility] ENDOSCOPY;  Service: Endoscopy;  Laterality: [name];  . CORONARY ARTERY BYPASS GRAFT  04/2011   Severe multivessel ASCAD s/p Stemi w PTCA of OM2 then s/p CABG w LIMA to LAD,SVG to OM1 /RI and SVG to Distal RCA   . FINGER SURGERY     left finger for ganglion cyst  . KIDNEY STONE SURGERY      Current  Medications: Current Meds  Medication Sig  . co-enzyme Q-10 30 MG capsule Take 300 mg by mouth daily.  [name] doxepin (SINEQUAN) 25 MG capsule Take 25 mg by mouth at bedtime.  [name] esomeprazole (NEXIUM) 40 MG capsule Take 40 mg by mouth daily as needed.  . fish oil-omega-3 fatty acids 1000 MG capsule Take 4 g by mouth daily.  [name] HYDROcodone-acetaminophen (NORCO) 7.5-325 MG per tablet Take 1 tablet by mouth every 6 (six) hours as needed for moderate pain.  . metoprolol (LOPRESSOR) 50 MG tablet Take 1 tablet (50 mg total) by mouth 2 (two) times daily.  . rivaroxaban (XARELTO) 20 MG TABS tablet Take 1 tablet (20 mg total) by mouth daily with supper.  . rosuvastatin (CRESTOR) 20 MG tablet Take 1 tablet (20 mg total) by mouth daily.  . vitamin B-12 (CYANOCOBALAMIN) 100 MCG tablet Take 100 mcg by mouth daily.  [name] zolpidem (AMBIEN) 10 MG tablet Take 10 mg by mouth at bedtime as needed for sleep.     Allergies:   Celebrex [celecoxib]   Social History   Social History  . Marital status: Married    Spouse name: [name]  . Number of children: [name]  . Years of education: [name]   Social History Main Topics  . Smoking status: Never Smoker  . Smokeless tobacco: Never Used  . Alcohol use Yes     Comment: yes occassionally  . Drug use: No  . Sexual activity: Not Asked   Other Topics Concern  . None   Social History Narrative  . None     Family History: The patient's family history includes Alzheimer's disease in his mother; Arrhythmia in his father and sister; CVA in his father; Cancer in his mother.  ROS:   Please see the history of present illness.     All other systems reviewed and are negative.  EKGs/Labs/Other Studies Reviewed:    The following studies were reviewed today: CPAP downlaod  EKG:  EKG is not ordered today.  Recent Labs: 03/13/2016: Magnesium 2.1 03/22/2016: Hemoglobin 17.9; Platelets 167 05/01/2016: ALT 19; BUN 14; Creatinine, Ser 1.21; Potassium 4.0; Sodium 141   Recent Lipid  Panel    Component Value Date/Time   CHOL 143 05/01/2016 0733   CHOL 123 01/14/2013 0923   TRIG 104 05/01/2016 0733   TRIG 141 01/14/2013 0923   HDL 29 (L) 05/01/2016 0733   HDL 34 (L) 01/14/2013 0923   CHOLHDL 4.9 05/01/2016 0733   CHOLHDL 5.8 (H) 12/20/2015 0914   VLDL 38 (H) 12/20/2015 0914   LDLCALC 93 05/01/2016 0733   LDLCALC 61 01/14/2013 0923    Physical Exam:  VS:  BP 128/84   Pulse 69   Ht 5' 9.5" (1.765 m)   Wt 189 lb 12.8 oz (86.1 kg)   SpO2 97%   BMI 27.63 kg/m     Wt Readings from Last 3 Encounters:  09/18/16 189 lb 12.8 oz (86.1 kg)  07/16/16 190 lb 6.4 oz (86.4 kg)  06/27/16 188 lb (85.3 kg)     GEN:  Well nourished, well developed in no acute distress HEENT: Normal NECK: No JVD; No carotid bruits LYMPHATICS: No lymphadenopathy CARDIAC: RRR, no murmurs, rubs, gallops RESPIRATORY:  Clear to auscultation without rales, wheezing or rhonchi  ABDOMEN: Soft, non-tender, non-distended MUSCULOSKELETAL:  No edema; No deformity  SKIN: Warm and dry NEUROLOGIC:  Alert and oriented x 3 PSYCHIATRIC:  Normal affect   ASSESSMENT:    1. Coronary artery disease involving native coronary artery of native heart without angina pectoris   2. Benign essential HTN   3. Bilateral carotid artery disease (HCC)   4. Persistent atrial fibrillation ([place])   5. Dilatation of aorta (HCC)   6. OSA on CPAP   7. Mixed hyperlipidemia    PLAN:    In order of problems listed above:  1. ASCAD - status post CABG 4 2013 in setting of MI in [place] with initial PTCA of the OM 2 and subsequent CABG with LIMA to the LAD, SVG to diagonal 1, SVG to OM1/R1 and SVG to distal RCA.  He denies any anginal chest pain.  Continue BB and statin.  No ASA due to DOAC.  2. HTN - BP is well controlled on exam today. He will continue Lopressor  3. Bilateral carotid artery disease (1-39% by dopplers 12/2014) - repeat 12/2016.  Continue statin.  No ASA due to  DOAC.  4. Persistent atrial fibrillation - he is maintaining NSR.  He will continue Rivaroxaban 20mg  daily.  CBC and BMET 05/2016 were normal. Continue Lopressor 50mg  BID.  5. Moderate dilated aortic root at 73mm by echo 12/2015 - repeat echo 12/2016.  He is followed by Dr. Cyndia Willis.  6.    OSA - the patient is tolerating PAP therapy well without any problems. The PAP download was reviewed today and showed an AHI of 2.3/hr on 7 cm H2O with 50% compliance in using more than 4 hours nightly.  The patient has been using and benefiting from CPAP use and will continue to benefit from therapy. His compliance fell off briefly when he had a cold but is using it nightly now.   7.    Hyperlipidemia with LDL goal < 70. He will continue on Rosuvastatin 20mg  daily.  He was changed from pravastatin to rosuvastatin in may.  I will repeat FLP and ALT.    Medication Adjustments/Labs and Tests Ordered: Current medicines are reviewed at length with the patient today.  Concerns regarding medicines are outlined above.  No orders of the defined types were placed in this encounter.  No orders of the defined types were placed in this encounter.   Signed, Sean Him, MD  09/18/2016 8:56 AM    [place]

## 2016-09-18 ENCOUNTER — Encounter: Payer: Self-pay | Admitting: Cardiology

## 2016-09-18 ENCOUNTER — Ambulatory Visit (INDEPENDENT_AMBULATORY_CARE_PROVIDER_SITE_OTHER): Payer: Medicare Other | Admitting: Cardiology

## 2016-09-18 VITALS — BP 128/84 | HR 69 | Ht 69.5 in | Wt 189.8 lb

## 2016-09-18 DIAGNOSIS — E782 Mixed hyperlipidemia: Secondary | ICD-10-CM | POA: Diagnosis not present

## 2016-09-18 DIAGNOSIS — Z9989 Dependence on other enabling machines and devices: Secondary | ICD-10-CM

## 2016-09-18 DIAGNOSIS — I4819 Other persistent atrial fibrillation: Secondary | ICD-10-CM

## 2016-09-18 DIAGNOSIS — I481 Persistent atrial fibrillation: Secondary | ICD-10-CM

## 2016-09-18 DIAGNOSIS — I2510 Atherosclerotic heart disease of native coronary artery without angina pectoris: Secondary | ICD-10-CM | POA: Diagnosis not present

## 2016-09-18 DIAGNOSIS — I739 Peripheral vascular disease, unspecified: Secondary | ICD-10-CM

## 2016-09-18 DIAGNOSIS — I10 Essential (primary) hypertension: Secondary | ICD-10-CM | POA: Diagnosis not present

## 2016-09-18 DIAGNOSIS — I77819 Aortic ectasia, unspecified site: Secondary | ICD-10-CM

## 2016-09-18 DIAGNOSIS — G4733 Obstructive sleep apnea (adult) (pediatric): Secondary | ICD-10-CM

## 2016-09-18 DIAGNOSIS — I779 Disorder of arteries and arterioles, unspecified: Secondary | ICD-10-CM

## 2016-09-18 LAB — HEPATIC FUNCTION PANEL
ALBUMIN: 4.5 g/dL (ref 3.5–4.8)
ALK PHOS: 75 IU/L (ref 39–117)
ALT: 26 IU/L (ref 0–44)
AST: 24 IU/L (ref 0–40)
BILIRUBIN TOTAL: 0.7 mg/dL (ref 0.0–1.2)
BILIRUBIN, DIRECT: 0.18 mg/dL (ref 0.00–0.40)
Total Protein: 7.1 g/dL (ref 6.0–8.5)

## 2016-09-18 LAB — LIPID PANEL
CHOLESTEROL TOTAL: 127 mg/dL (ref 100–199)
Chol/HDL Ratio: 4.7 ratio (ref 0.0–5.0)
HDL: 27 mg/dL — ABNORMAL LOW (ref >39)
LDL CALC: 70 mg/dL (ref 0–99)
TRIGLYCERIDES: 151 mg/dL — AB (ref 0–149)
VLDL Cholesterol Cal: 30 mg/dL (ref 5–40)

## 2016-09-18 NOTE — Patient Instructions (Signed)
Lab Work today ( lipid,hepatic panels )    Your physician wants you to follow-up in: 6 months. You will receive a reminder letter in the mail two months in advance. If you don't receive a letter, please call our office to schedule the follow-up appointment.

## 2016-09-18 NOTE — Addendum Note (Signed)
Addended by: Marciano Sequin on: 09/18/2016 09:24 AM   Modules accepted: Orders

## 2016-09-20 ENCOUNTER — Telehealth: Payer: Self-pay | Admitting: Cardiology

## 2016-09-20 NOTE — Telephone Encounter (Signed)
Pt aware of lab results ./cy 

## 2016-09-20 NOTE — Telephone Encounter (Signed)
F/u message ° °Pt returning RN call .please call back to discuss  °

## 2016-10-26 DIAGNOSIS — G4700 Insomnia, unspecified: Secondary | ICD-10-CM | POA: Diagnosis not present

## 2016-10-26 DIAGNOSIS — R21 Rash and other nonspecific skin eruption: Secondary | ICD-10-CM | POA: Diagnosis not present

## 2016-10-31 DIAGNOSIS — H2512 Age-related nuclear cataract, left eye: Secondary | ICD-10-CM | POA: Diagnosis not present

## 2016-11-12 ENCOUNTER — Encounter: Payer: Self-pay | Admitting: *Deleted

## 2016-11-13 ENCOUNTER — Encounter
Admission: RE | Disposition: A | Discharge status: Home / Self Care | Payer: Self-pay | Source: Ambulatory Visit | Attending: Ophthalmology

## 2016-11-13 ENCOUNTER — Encounter: Payer: Self-pay | Admitting: Emergency Medicine

## 2016-11-13 ENCOUNTER — Ambulatory Visit
Admission: RE | Admit: 2016-11-13 | Discharge: 2016-11-13 | Disposition: A | Discharge status: Home / Self Care | Payer: Medicare Other | Source: Ambulatory Visit | Attending: Ophthalmology | Admitting: Ophthalmology

## 2016-11-13 ENCOUNTER — Ambulatory Visit: Payer: Medicare Other | Admitting: Certified Registered"

## 2016-11-13 DIAGNOSIS — Z955 Presence of coronary angioplasty implant and graft: Secondary | ICD-10-CM | POA: Insufficient documentation

## 2016-11-13 DIAGNOSIS — M109 Gout, unspecified: Secondary | ICD-10-CM | POA: Diagnosis not present

## 2016-11-13 DIAGNOSIS — Z79899 Other long term (current) drug therapy: Secondary | ICD-10-CM | POA: Diagnosis not present

## 2016-11-13 DIAGNOSIS — I252 Old myocardial infarction: Secondary | ICD-10-CM | POA: Diagnosis not present

## 2016-11-13 DIAGNOSIS — I2510 Atherosclerotic heart disease of native coronary artery without angina pectoris: Secondary | ICD-10-CM | POA: Insufficient documentation

## 2016-11-13 DIAGNOSIS — K219 Gastro-esophageal reflux disease without esophagitis: Secondary | ICD-10-CM | POA: Diagnosis not present

## 2016-11-13 DIAGNOSIS — I10 Essential (primary) hypertension: Secondary | ICD-10-CM | POA: Insufficient documentation

## 2016-11-13 DIAGNOSIS — M1990 Unspecified osteoarthritis, unspecified site: Secondary | ICD-10-CM | POA: Diagnosis not present

## 2016-11-13 DIAGNOSIS — I739 Peripheral vascular disease, unspecified: Secondary | ICD-10-CM | POA: Insufficient documentation

## 2016-11-13 DIAGNOSIS — H2512 Age-related nuclear cataract, left eye: Secondary | ICD-10-CM | POA: Diagnosis not present

## 2016-11-13 DIAGNOSIS — F5104 Psychophysiologic insomnia: Secondary | ICD-10-CM | POA: Insufficient documentation

## 2016-11-13 DIAGNOSIS — Z951 Presence of aortocoronary bypass graft: Secondary | ICD-10-CM | POA: Diagnosis not present

## 2016-11-13 DIAGNOSIS — G4730 Sleep apnea, unspecified: Secondary | ICD-10-CM | POA: Diagnosis not present

## 2016-11-13 DIAGNOSIS — N400 Enlarged prostate without lower urinary tract symptoms: Secondary | ICD-10-CM | POA: Insufficient documentation

## 2016-11-13 HISTORY — DX: Acute myocardial infarction, unspecified: I21.9

## 2016-11-13 HISTORY — DX: Personal history of urinary calculi: Z87.442

## 2016-11-13 HISTORY — DX: Sleep apnea, unspecified: G47.30

## 2016-11-13 HISTORY — PX: CATARACT EXTRACTION W/PHACO: SHX586

## 2016-11-13 HISTORY — DX: Cardiac arrhythmia, unspecified: I49.9

## 2016-11-13 SURGERY — PHACOEMULSIFICATION, CATARACT, WITH IOL INSERTION
Anesthesia: Monitor Anesthesia Care | Site: Eye | Laterality: Left | Wound class: Clean

## 2016-11-13 MED ORDER — NA CHONDROIT SULF-NA HYALURON 40-17 MG/ML IO SOLN
INTRAOCULAR | Status: DC | PRN
  Administered 2016-11-13: 1 mL via INTRAOCULAR

## 2016-11-13 MED ORDER — BSS IO SOLN
INTRAOCULAR | Status: DC | PRN
  Administered 2016-11-13: 10:00:00 via OPHTHALMIC

## 2016-11-13 MED ORDER — POVIDONE-IODINE 5 % OP SOLN
OPHTHALMIC | Status: AC
  Filled 2016-11-13: qty 30

## 2016-11-13 MED ORDER — CARBACHOL 0.01 % IO SOLN
INTRAOCULAR | Status: DC | PRN
  Administered 2016-11-13: 0.5 mL via INTRAOCULAR

## 2016-11-13 MED ORDER — LIDOCAINE HCL (PF) 4 % IJ SOLN
INTRAMUSCULAR | Status: AC
  Filled 2016-11-13: qty 5

## 2016-11-13 MED ORDER — LIDOCAINE HCL (PF) 4 % IJ SOLN
INTRAOCULAR | Status: DC | PRN
  Administered 2016-11-13: 4 mL via OPHTHALMIC

## 2016-11-13 MED ORDER — NA CHONDROIT SULF-NA HYALURON 40-17 MG/ML IO SOLN
INTRAOCULAR | Status: AC
  Filled 2016-11-13: qty 1

## 2016-11-13 MED ORDER — ARMC OPHTHALMIC DILATING DROPS
OPHTHALMIC | Status: AC
  Administered 2016-11-13: 1 via OPHTHALMIC
  Filled 2016-11-13: qty 0.4

## 2016-11-13 MED ORDER — MOXIFLOXACIN HCL 0.5 % OP SOLN: 1.0000 [drp] | OPHTHALMIC | Status: DC | PRN

## 2016-11-13 MED ORDER — MOXIFLOXACIN HCL 0.5 % OP SOLN
OPHTHALMIC | Status: DC | PRN
  Administered 2016-11-13: 0.2 mL via OPHTHALMIC

## 2016-11-13 MED ORDER — ARMC OPHTHALMIC DILATING DROPS
1.0000 "application " | OPHTHALMIC | Status: AC
  Administered 2016-11-13 (×3): 1 via OPHTHALMIC

## 2016-11-13 MED ORDER — SODIUM CHLORIDE 0.9 % IV SOLN
INTRAVENOUS | Status: DC
  Administered 2016-11-13: 09:00:00 via INTRAVENOUS

## 2016-11-13 MED ORDER — MOXIFLOXACIN HCL 0.5 % OP SOLN
OPHTHALMIC | Status: AC
  Filled 2016-11-13: qty 3

## 2016-11-13 MED ORDER — POVIDONE-IODINE 5 % OP SOLN
OPHTHALMIC | Status: DC | PRN
  Administered 2016-11-13: 1 via OPHTHALMIC

## 2016-11-13 MED ORDER — EPINEPHRINE PF 1 MG/ML IJ SOLN
INTRAMUSCULAR | Status: AC
  Filled 2016-11-13: qty 2

## 2016-11-13 MED ORDER — MIDAZOLAM HCL 2 MG/2ML IJ SOLN
INTRAMUSCULAR | Status: DC | PRN
  Administered 2016-11-13: 1 mg via INTRAVENOUS

## 2016-11-13 SURGICAL SUPPLY — 16 items
GLOVE BIO SURGEON STRL SZ8 (GLOVE) ×2 IMPLANT
GLOVE BIOGEL M 6.5 STRL (GLOVE) ×2 IMPLANT
GLOVE SURG LX 8.0 MICRO (GLOVE) ×1
GLOVE SURG LX STRL 8.0 MICRO (GLOVE) ×1 IMPLANT
GOWN STRL REUS W/ TWL LRG LVL3 (GOWN DISPOSABLE) ×2 IMPLANT
GOWN STRL REUS W/TWL LRG LVL3 (GOWN DISPOSABLE) ×2
LABEL CATARACT MEDS ST (LABEL) ×2 IMPLANT
LENS IOL TECNIS ITEC 16.5 (Intraocular Lens) ×2 IMPLANT
PACK CATARACT (MISCELLANEOUS) ×2 IMPLANT
PACK CATARACT BRASINGTON LX (MISCELLANEOUS) ×2 IMPLANT
PACK EYE AFTER SURG (MISCELLANEOUS) ×2 IMPLANT
SOL BSS BAG (MISCELLANEOUS) ×2
SOLUTION BSS BAG (MISCELLANEOUS) ×1 IMPLANT
SYR 5ML LL (SYRINGE) ×2 IMPLANT
WATER STERILE IRR 250ML POUR (IV SOLUTION) ×2 IMPLANT
WIPE NON LINTING 3.25X3.25 (MISCELLANEOUS) ×2 IMPLANT

## 2016-11-13 NOTE — Anesthesia Preprocedure Evaluation (Signed)
Anesthesia Evaluation  Patient identified by MRN, date of birth, ID band Patient awake    Reviewed: Allergy & Precautions, H&P , NPO status , Patient's Chart, lab work & pertinent test results  Airway Mallampati: II  TM Distance: >3 FB Neck ROM: limited    Dental no notable dental hx. (+) Teeth Intact   Pulmonary neg shortness of breath, sleep apnea ,    Pulmonary exam normal breath sounds clear to auscultation       Cardiovascular hypertension, + CAD, + Past MI, + CABG and + Peripheral Vascular Disease  Normal cardiovascular exam+ dysrhythmias  Rhythm:regular Rate:Normal     Neuro/Psych negative neurological ROS  negative psych ROS   GI/Hepatic Neg liver ROS, GERD  Controlled,  Endo/Other  negative endocrine ROS  Renal/GU Renal disease  negative genitourinary   Musculoskeletal  (+) Arthritis ,   Abdominal   Peds  Hematology negative hematology ROS (+)   Anesthesia Other Findings Past Medical History:   Hypertension                                                 GERD (gastroesophageal reflux disease)                       ED (erectile dysfunction)                                    Chronic insomnia                                             Hypertriglyceridemia                                         Osteoarthritis                                                 Comment:of the knee   DDD (degenerative disc disease), lumbar                      Colon polyp                                     10/11        Gout                                                         Chest wall pain  Comment:Right side, muscular,ortho,MRI. U/S abd ok. On               lyrica   Acute MI ([place])                                  4/13           Comment:S/P CABG X4 Sx done in [place]   Arrhythmia                                                     Comment:Post op a-fib w episodes,  several short burst               of PAF at cardiac rehab-CHADS score is 1   Carotid artery stenosis                                        Comment:Left 15-49 %   Dilatation of aorta (HCC)                                      Comment:Mild   Coronary artery disease                         04/2011         Comment:s/p CABG in setting of MI in [place], [place],               with initial PTCA of OM2 then CABG with LIMA to              LAD, SVG to D1, SVG to OM1,/R1 and SVG to               distal RCA   BPH (benign prostatic hyperplasia)                           Kidney stone                                                   Comment:uric acid, Dr [name]  renal insufficiency  Past Surgical History:   CORONARY ARTERY BYPASS GRAFT                     04/2011        Comment:Severe multivessel ASCAD s/p Stemi w PTCA of               OM2 then s/p CABG w LIMA to LAD,SVG to OM1 /RI               and SVG to Distal RCA    KIDNEY STONE SURGERY                                          FINGER SURGERY  BMI    Body Mass Index   26.86 kg/m 2      Reproductive/Obstetrics negative OB ROS                             Anesthesia Physical  Anesthesia Plan  ASA: III  Anesthesia Plan: MAC   Post-op Pain Management:    Induction: Intravenous  PONV Risk Score and Plan: 1  Airway Management Planned: Nasal Cannula  Additional Equipment:   Intra-op Plan:   Post-operative Plan:   Informed Consent: I have reviewed the patients History and Physical, chart, labs and discussed the procedure including the risks, benefits and alternatives for the proposed anesthesia with the patient or authorized representative who has indicated his/her understanding and acceptance.   Dental Advisory Given  Plan Discussed with: Anesthesiologist, CRNA and Surgeon  Anesthesia Plan Comments:         Anesthesia Quick Evaluation

## 2016-11-13 NOTE — H&P (Signed)
All labs reviewed. Abnormal studies sent to patients PCP when indicated.  Previous H&P reviewed, patient examined, there are NO CHANGES.  Sean Willis LOUIS11/13/20189:34 AM

## 2016-11-13 NOTE — Transfer of Care (Signed)
Immediate Anesthesia Transfer of Care Note  Patient: Sean Willis  Procedure(s) Performed: CATARACT EXTRACTION PHACO AND INTRAOCULAR LENS PLACEMENT ([place]) (Left Eye)  Patient Location: Short Stay  Anesthesia Type:MAC  Level of Consciousness: awake, alert  and oriented  Airway & Oxygen Therapy: Patient Spontanous Breathing  Post-op Assessment: Report given to RN and Post -op Vital signs reviewed and stable  Post vital signs: Reviewed  Last Vitals:  Vitals:   11/13/16 0840 11/13/16 0958  BP: (!) 135/97 116/90  Pulse: 79 71  Resp: 18 18  Temp: 36.4 C 36.8 C  SpO2: 98% 96%    Last Pain:  Vitals:   11/13/16 0840  TempSrc: Temporal         Complications: No apparent anesthesia complications

## 2016-11-13 NOTE — Discharge Instructions (Signed)
Eye Surgery Discharge Instructions  Expect mild scratchy sensation or mild soreness. DO NOT RUB YOUR EYE!  The day of surgery:  Minimal physical activity, but bed rest is not required  No reading, computer work, or close hand work  No bending, lifting, or straining.  May watch TV  For 24 hours:  No driving, legal decisions, or alcoholic beverages  Safety precautions  Eat anything you prefer: It is better to start with liquids, then soup then solid foods.  _____ Eye patch should be worn until postoperative exam tomorrow.  ____ Solar shield eyeglasses should be worn for comfort in the sunlight/patch while sleeping  Resume all regular medications including aspirin or Coumadin if these were discontinued prior to surgery. You may shower, bathe, shave, or wash your hair. Tylenol may be taken for mild discomfort.  Call your doctor if you experience significant pain, nausea, or vomiting, fever > 101 or other signs of infection. [phone] or [phone] Specific instructions:  Follow-up Information    [name], MD Follow up.   Specialty:  Ophthalmology Why:  November 14 at 1:50pm Contact information: [address] [place] [address] [phone]

## 2016-11-13 NOTE — Anesthesia Post-op Follow-up Note (Signed)
Anesthesia QCDR form completed.        

## 2016-11-13 NOTE — Anesthesia Postprocedure Evaluation (Signed)
Anesthesia Post Note  Patient: Sean Willis  Procedure(s) Performed: CATARACT EXTRACTION PHACO AND INTRAOCULAR LENS PLACEMENT ([place]) (Left Eye)  Patient location during evaluation: PACU Anesthesia Type: MAC Level of consciousness: awake and alert Pain management: pain level controlled Vital Signs Assessment: post-procedure vital signs reviewed and stable Respiratory status: spontaneous breathing, nonlabored ventilation, respiratory function stable and patient connected to nasal cannula oxygen Cardiovascular status: stable and blood pressure returned to baseline Postop Assessment: no apparent nausea or vomiting Anesthetic complications: no     Last Vitals:  Vitals:   11/13/16 0840 11/13/16 0958  BP: (!) 135/97 116/90  Pulse: 79 71  Resp: 18 18  Temp: 36.4 C 36.8 C  SpO2: 98% 96%    Last Pain:  Vitals:   11/13/16 0840  TempSrc: Temporal                 Brantley Fling

## 2016-11-13 NOTE — Op Note (Signed)
PREOPERATIVE DIAGNOSIS:  Nuclear sclerotic cataract of the left eye.   POSTOPERATIVE DIAGNOSIS:  Nuclear sclerotic cataract of the left eye.   OPERATIVE PROCEDURE: Procedure(s): CATARACT EXTRACTION PHACO AND INTRAOCULAR LENS PLACEMENT ([facility])   SURGEON:  [name], MD.   ANESTHESIA:  Anesthesiologist: Martha Clan, MD CRNA: Rolla Plate, CRNA  1.      Managed anesthesia care. 2.     0.68ml of Shugarcaine was instilled following the paracentesis   COMPLICATIONS:  None.   TECHNIQUE:   Stop and chop   DESCRIPTION OF PROCEDURE:  The patient was examined and consented in the preoperative holding area where the aforementioned topical anesthesia was applied to the left eye and then brought back to the Operating Room where the left eye was prepped and draped in the usual sterile ophthalmic fashion and a lid speculum was placed. A paracentesis was created with the side port blade and the anterior chamber was filled with viscoelastic. A near clear corneal incision was performed with the steel keratome. A continuous curvilinear capsulorrhexis was performed with a cystotome followed by the capsulorrhexis forceps. Hydrodissection and hydrodelineation were carried out with BSS on a blunt cannula. The lens was removed in a stop and chop  technique and the remaining cortical material was removed with the irrigation-aspiration handpiece. The capsular bag was inflated with viscoelastic and the Technis [id number] lens was placed in the capsular bag without complication. The remaining viscoelastic was removed from the eye with the irrigation-aspiration handpiece. The wounds were hydrated. The anterior chamber was flushed with Miostat and the eye was inflated to physiologic pressure. 0.46ml Vigamox was placed in the anterior chamber. The wounds were found to be water tight. The eye was dressed with Vigamox. The patient was given protective glasses to wear throughout the day and a shield with which to sleep  tonight. The patient was also given drops with which to begin a drop regimen today and will follow-up with me in one day. Implant Name Type Inv. Item Serial No. Manufacturer Lot No. LRB No. Used  LENS IOL DIOP 16.5 - [id number] Intraocular Lens LENS IOL DIOP 16.5 [id number] AMO  Left 1    Procedure(s) with comments: CATARACT EXTRACTION PHACO AND INTRAOCULAR LENS PLACEMENT ([facility]) (Left) - [place] 01:00.2 AP% 15.7 CDE 9.47 Fluid Pack lot # [id number]  Electronically signed: [place] 11/13/2016 9:56 AM

## 2016-11-14 ENCOUNTER — Encounter: Payer: Self-pay | Admitting: Ophthalmology

## 2016-12-12 DIAGNOSIS — N281 Cyst of kidney, acquired: Secondary | ICD-10-CM | POA: Diagnosis not present

## 2016-12-12 DIAGNOSIS — R351 Nocturia: Secondary | ICD-10-CM | POA: Diagnosis not present

## 2016-12-12 DIAGNOSIS — N200 Calculus of kidney: Secondary | ICD-10-CM | POA: Diagnosis not present

## 2016-12-12 DIAGNOSIS — N401 Enlarged prostate with lower urinary tract symptoms: Secondary | ICD-10-CM | POA: Diagnosis not present

## 2016-12-14 ENCOUNTER — Other Ambulatory Visit: Payer: Self-pay | Admitting: Cardiology

## 2016-12-14 NOTE — Telephone Encounter (Signed)
Age 71 Wt 86.1 kg 09/18/2016 09/18/2016 Saw  Dr Radford Pax 05/01/2016 SrCr 1.2 03/22/2016 Hgb 17.9 HCT 51.3 CrCl 68.19  Refill done for Xarelto 20 mg daily as requested

## 2016-12-18 ENCOUNTER — Ambulatory Visit (HOSPITAL_COMMUNITY)
Admission: RE | Admit: 2016-12-18 | Discharge: 2016-12-18 | Disposition: A | Discharge status: Home / Self Care | Payer: Medicare Other | Source: Ambulatory Visit | Attending: Cardiovascular Disease | Admitting: Cardiovascular Disease

## 2016-12-18 DIAGNOSIS — I779 Disorder of arteries and arterioles, unspecified: Secondary | ICD-10-CM | POA: Diagnosis not present

## 2016-12-18 DIAGNOSIS — I739 Peripheral vascular disease, unspecified: Secondary | ICD-10-CM

## 2016-12-19 ENCOUNTER — Encounter: Payer: Self-pay | Admitting: Cardiology

## 2016-12-28 ENCOUNTER — Ambulatory Visit (HOSPITAL_COMMUNITY): Payer: Medicare Other | Attending: Internal Medicine

## 2016-12-28 ENCOUNTER — Other Ambulatory Visit: Payer: Self-pay

## 2016-12-28 DIAGNOSIS — I10 Essential (primary) hypertension: Secondary | ICD-10-CM | POA: Insufficient documentation

## 2016-12-28 DIAGNOSIS — E785 Hyperlipidemia, unspecified: Secondary | ICD-10-CM | POA: Insufficient documentation

## 2016-12-28 DIAGNOSIS — I340 Nonrheumatic mitral (valve) insufficiency: Secondary | ICD-10-CM | POA: Insufficient documentation

## 2016-12-28 DIAGNOSIS — I77819 Aortic ectasia, unspecified site: Secondary | ICD-10-CM | POA: Diagnosis not present

## 2016-12-28 DIAGNOSIS — I4891 Unspecified atrial fibrillation: Secondary | ICD-10-CM | POA: Insufficient documentation

## 2016-12-28 DIAGNOSIS — G4733 Obstructive sleep apnea (adult) (pediatric): Secondary | ICD-10-CM | POA: Diagnosis not present

## 2016-12-28 DIAGNOSIS — I2510 Atherosclerotic heart disease of native coronary artery without angina pectoris: Secondary | ICD-10-CM | POA: Insufficient documentation

## 2016-12-28 DIAGNOSIS — I252 Old myocardial infarction: Secondary | ICD-10-CM | POA: Diagnosis not present

## 2017-01-04 ENCOUNTER — Encounter (HOSPITAL_COMMUNITY): Payer: Self-pay | Admitting: Emergency Medicine

## 2017-01-04 ENCOUNTER — Emergency Department (HOSPITAL_COMMUNITY)
Admission: EM | Admit: 2017-01-04 | Discharge: 2017-01-04 | Disposition: A | Discharge status: Home / Self Care | Payer: Medicare Other | Attending: Emergency Medicine | Admitting: Emergency Medicine

## 2017-01-04 ENCOUNTER — Other Ambulatory Visit: Payer: Self-pay

## 2017-01-04 DIAGNOSIS — Z5321 Procedure and treatment not carried out due to patient leaving prior to being seen by health care provider: Secondary | ICD-10-CM | POA: Insufficient documentation

## 2017-01-04 DIAGNOSIS — R109 Unspecified abdominal pain: Secondary | ICD-10-CM | POA: Insufficient documentation

## 2017-01-04 LAB — COMPREHENSIVE METABOLIC PANEL
ALT: 37 U/L (ref 17–63)
AST: 68 U/L — ABNORMAL HIGH (ref 15–41)
Albumin: 4.2 g/dL (ref 3.5–5.0)
Alkaline Phosphatase: 81 U/L (ref 38–126)
Anion gap: 8 (ref 5–15)
BUN: 20 mg/dL (ref 6–20)
CHLORIDE: 106 mmol/L (ref 101–111)
CO2: 24 mmol/L (ref 22–32)
Calcium: 9 mg/dL (ref 8.9–10.3)
Creatinine, Ser: 1.35 mg/dL — ABNORMAL HIGH (ref 0.61–1.24)
GFR calc Af Amer: 59 mL/min — ABNORMAL LOW (ref >60)
GFR, EST NON AFRICAN AMERICAN: 51 mL/min — AB (ref >60)
Glucose, Bld: 126 mg/dL — ABNORMAL HIGH (ref 65–99)
POTASSIUM: 3.8 mmol/L (ref 3.5–5.1)
SODIUM: 138 mmol/L (ref 135–145)
Total Bilirubin: 1 mg/dL (ref 0.3–1.2)
Total Protein: 7.1 g/dL (ref 6.5–8.1)

## 2017-01-04 LAB — CBC
HEMATOCRIT: 44.2 % (ref 39.0–52.0)
Hemoglobin: 15.4 g/dL (ref 13.0–17.0)
MCH: 32.3 pg (ref 26.0–34.0)
MCHC: 34.8 g/dL (ref 30.0–36.0)
MCV: 92.7 fL (ref 78.0–100.0)
Platelets: 149 10*3/uL — ABNORMAL LOW (ref 150–400)
RBC: 4.77 MIL/uL (ref 4.22–5.81)
RDW: 13.7 % (ref 11.5–15.5)
WBC: 8.5 10*3/uL (ref 4.0–10.5)

## 2017-01-04 LAB — URINALYSIS, ROUTINE W REFLEX MICROSCOPIC
Bilirubin Urine: NEGATIVE
GLUCOSE, UA: NEGATIVE mg/dL
Hgb urine dipstick: NEGATIVE
Ketones, ur: NEGATIVE mg/dL
Leukocytes, UA: NEGATIVE
Nitrite: NEGATIVE
PH: 5 (ref 5.0–8.0)
Protein, ur: NEGATIVE mg/dL
Specific Gravity, Urine: 1.017 (ref 1.005–1.030)

## 2017-01-04 LAB — LIPASE, BLOOD: Lipase: 36 U/L (ref 11–51)

## 2017-01-04 NOTE — ED Notes (Signed)
Pt to the desk states his pain has resolved and he wants to see his PCP in the am.

## 2017-01-04 NOTE — ED Triage Notes (Signed)
Pt reports R sided abd pain onset 0000, states cramping but pain has subsided since driving to ED. Denies N/V/D. Last BM last night.

## 2017-01-11 DIAGNOSIS — D225 Melanocytic nevi of trunk: Secondary | ICD-10-CM | POA: Diagnosis not present

## 2017-01-11 DIAGNOSIS — L309 Dermatitis, unspecified: Secondary | ICD-10-CM | POA: Diagnosis not present

## 2017-01-11 DIAGNOSIS — L570 Actinic keratosis: Secondary | ICD-10-CM | POA: Diagnosis not present

## 2017-01-18 DIAGNOSIS — I25810 Atherosclerosis of coronary artery bypass graft(s) without angina pectoris: Secondary | ICD-10-CM | POA: Diagnosis not present

## 2017-01-18 DIAGNOSIS — I252 Old myocardial infarction: Secondary | ICD-10-CM | POA: Diagnosis not present

## 2017-01-18 DIAGNOSIS — N183 Chronic kidney disease, stage 3 (moderate): Secondary | ICD-10-CM | POA: Diagnosis not present

## 2017-01-18 DIAGNOSIS — E782 Mixed hyperlipidemia: Secondary | ICD-10-CM | POA: Diagnosis not present

## 2017-01-18 DIAGNOSIS — I712 Thoracic aortic aneurysm, without rupture: Secondary | ICD-10-CM | POA: Diagnosis not present

## 2017-01-18 DIAGNOSIS — I4891 Unspecified atrial fibrillation: Secondary | ICD-10-CM | POA: Diagnosis not present

## 2017-01-18 DIAGNOSIS — G4700 Insomnia, unspecified: Secondary | ICD-10-CM | POA: Diagnosis not present

## 2017-01-18 DIAGNOSIS — M109 Gout, unspecified: Secondary | ICD-10-CM | POA: Diagnosis not present

## 2017-01-18 DIAGNOSIS — I10 Essential (primary) hypertension: Secondary | ICD-10-CM | POA: Diagnosis not present

## 2017-01-18 DIAGNOSIS — Z1389 Encounter for screening for other disorder: Secondary | ICD-10-CM | POA: Diagnosis not present

## 2017-05-24 ENCOUNTER — Other Ambulatory Visit: Payer: Self-pay | Admitting: Surgery

## 2017-05-24 DIAGNOSIS — I712 Thoracic aortic aneurysm, without rupture, unspecified: Secondary | ICD-10-CM

## 2017-06-09 ENCOUNTER — Other Ambulatory Visit: Payer: Self-pay | Admitting: Cardiology

## 2017-06-09 DIAGNOSIS — E782 Mixed hyperlipidemia: Secondary | ICD-10-CM

## 2017-06-10 ENCOUNTER — Other Ambulatory Visit: Payer: Self-pay | Admitting: Cardiology

## 2017-06-10 NOTE — Telephone Encounter (Signed)
Xarelto 20mg  refill request received; pt is 72 yrs old, wt-86.1kg, Crea-1.35 on 01/04/17, last seen by [name] on 09/18/16, CrCl-61.81ml/min; will send in refill to requested pharmacy.

## 2017-06-26 ENCOUNTER — Other Ambulatory Visit: Payer: Self-pay

## 2017-06-26 ENCOUNTER — Ambulatory Visit
Admission: RE | Admit: 2017-06-26 | Discharge: 2017-06-26 | Disposition: A | Discharge status: Home / Self Care | Payer: Medicare Other | Source: Ambulatory Visit | Attending: Surgery | Admitting: Surgery

## 2017-06-26 ENCOUNTER — Ambulatory Visit (INDEPENDENT_AMBULATORY_CARE_PROVIDER_SITE_OTHER): Payer: Medicare Other | Admitting: Surgery

## 2017-06-26 ENCOUNTER — Encounter: Payer: Self-pay | Admitting: Surgery

## 2017-06-26 VITALS — BP 142/90 | HR 77 | Resp 18 | Ht 69.5 in | Wt 188.8 lb

## 2017-06-26 DIAGNOSIS — I712 Thoracic aortic aneurysm, without rupture, unspecified: Secondary | ICD-10-CM

## 2017-06-26 IMAGING — CT CT ANGIO CHEST
2 of 6 series · 13 of 36 positions shown · IV contrast (iopamidol)
Comparison: [DATE]

CLINICAL DATA: Thoracic aortic aneurysm, follow-up

EXAM:
CT ANGIOGRAPHY CHEST WITH CONTRAST
TECHNIQUE: Multidetector CT imaging of the chest was performed using the
standard protocol during bolus administration of intravenous
contrast. Multiplanar CT image reconstructions and MIPs were
obtained to evaluate the vascular anatomy.
CONTRAST:  75mL [LW] IOPAMIDOL ([LW]) INJECTION 76%
Creatinine was obtained on site at [HOSPITAL] at [REDACTED].
Results: Creatinine 1.2 mg/dL.  BUN 20.  GFR 60.

[Series 5: cta thorax 2.00 bv36 s3 ax axial arterial · axial · arterial · 0.52mm/px · z∈[+1447,+1723]mm · 12 of 164 slices shown]
[im 13/164  lung]
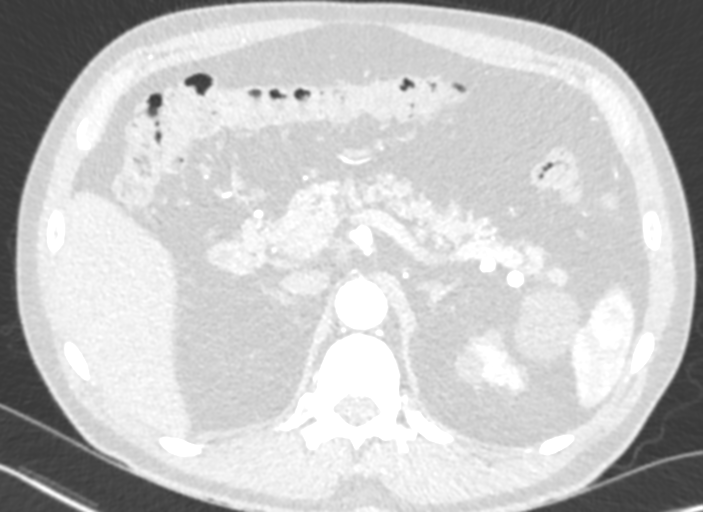
[im 26/164  mediastinal]
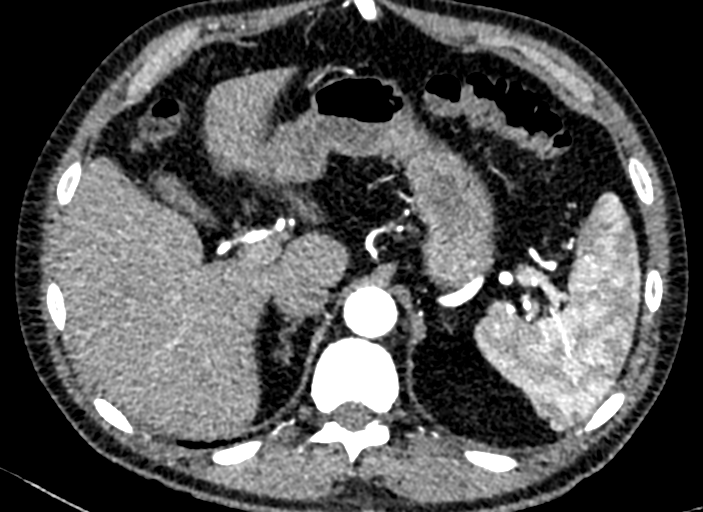
[im 38/164  lung]
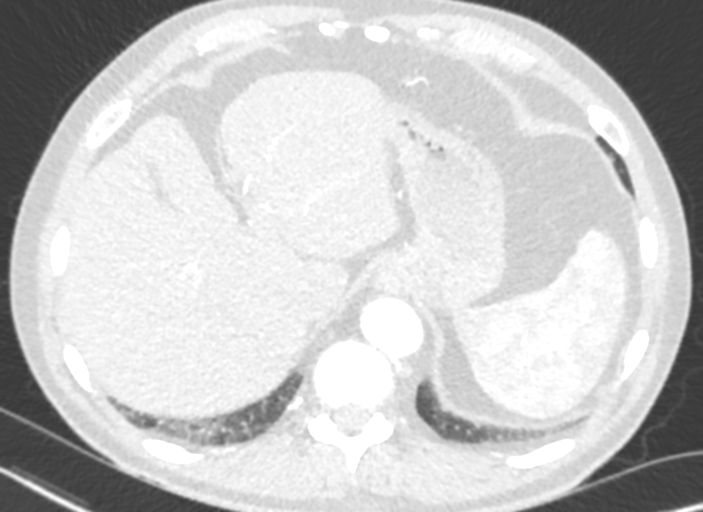
[im 51/164  mediastinal]
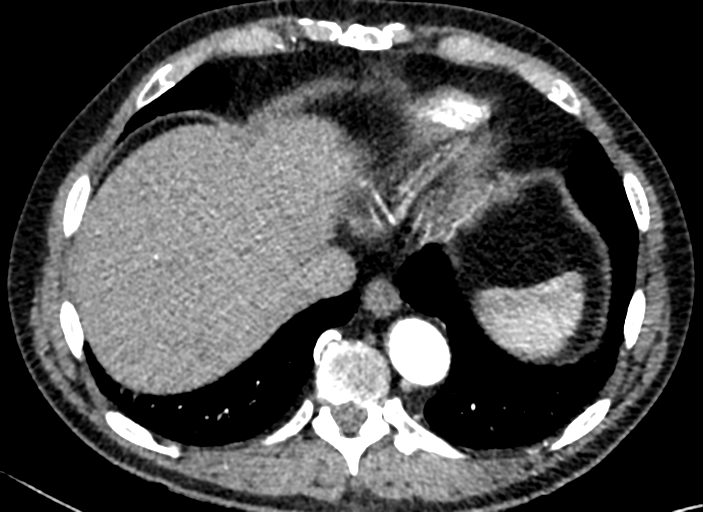
[im 63/164  lung]
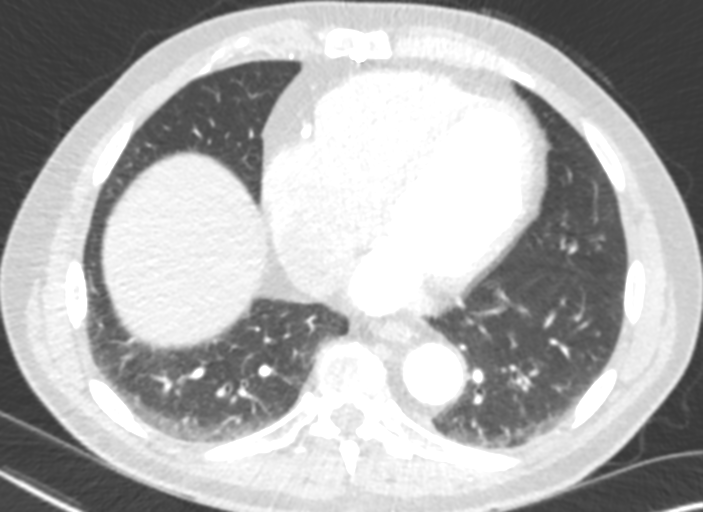
[im 76/164  mediastinal]
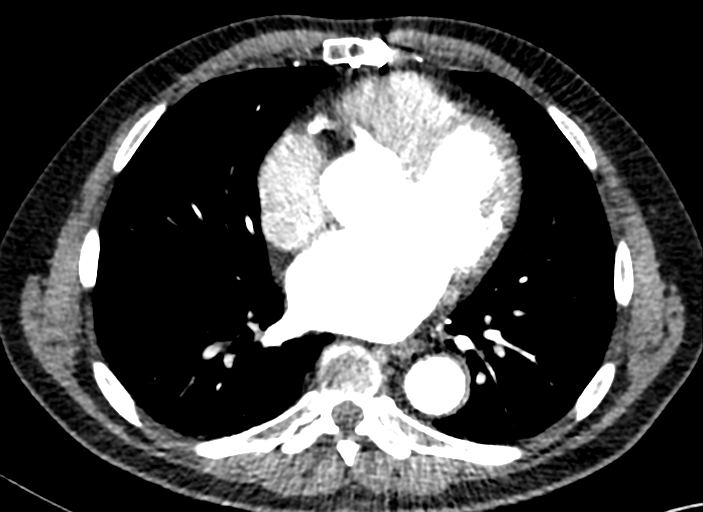
[im 88/164  lung]
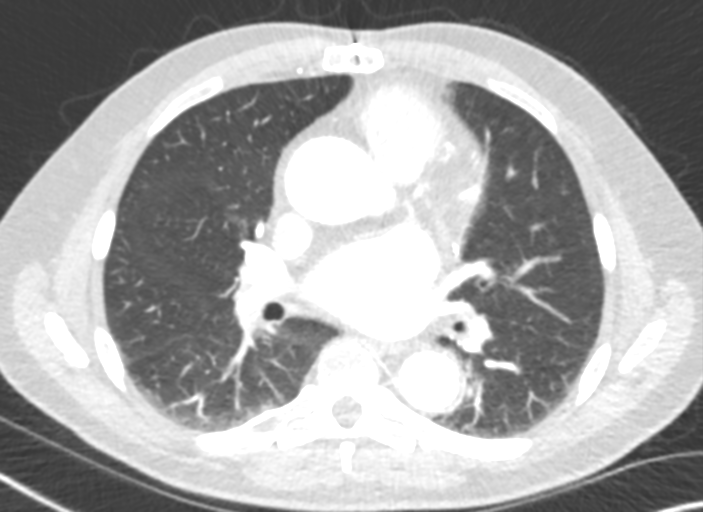
[im 101/164  mediastinal]
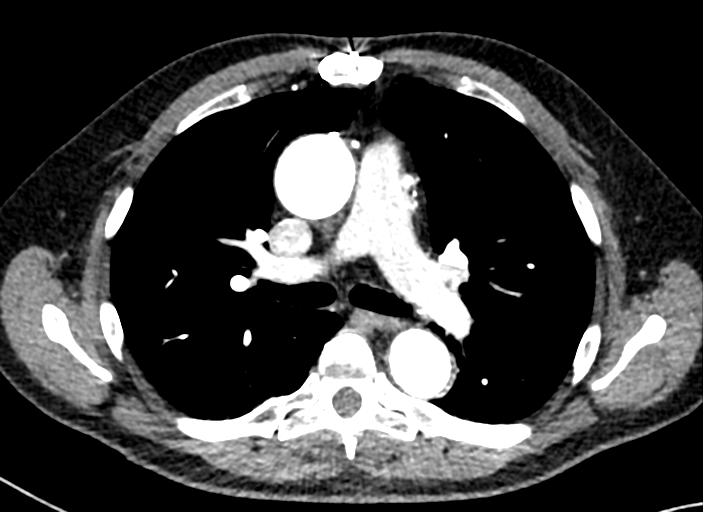
[im 113/164  lung]
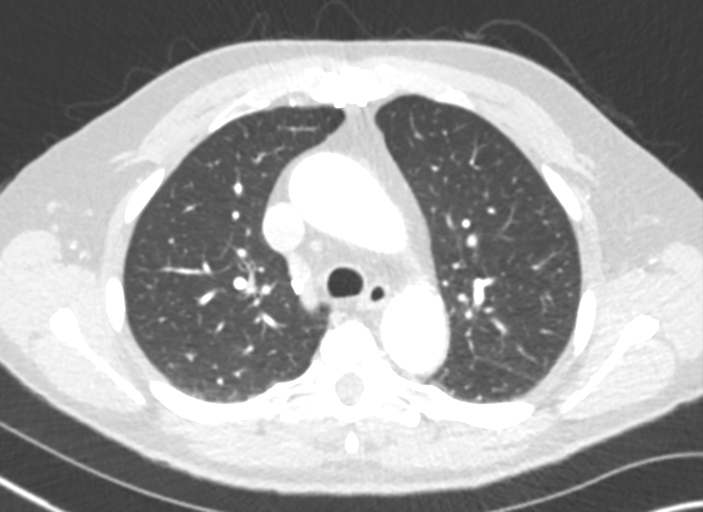
[im 126/164  mediastinal]
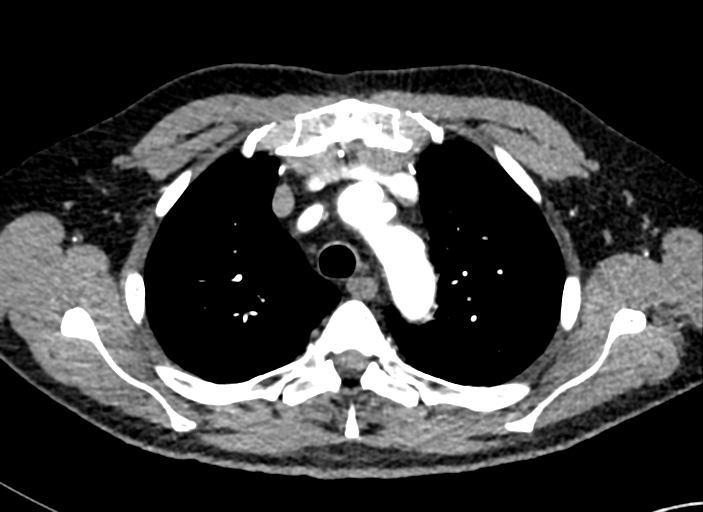
[im 138/164  lung]
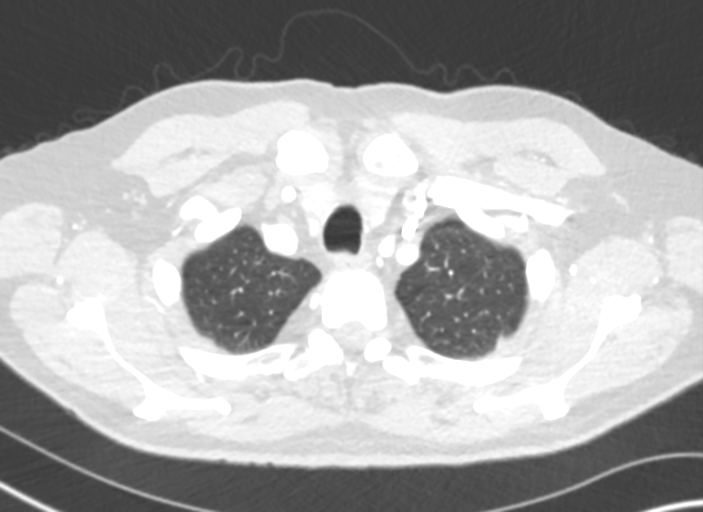
[im 151/164  mediastinal]
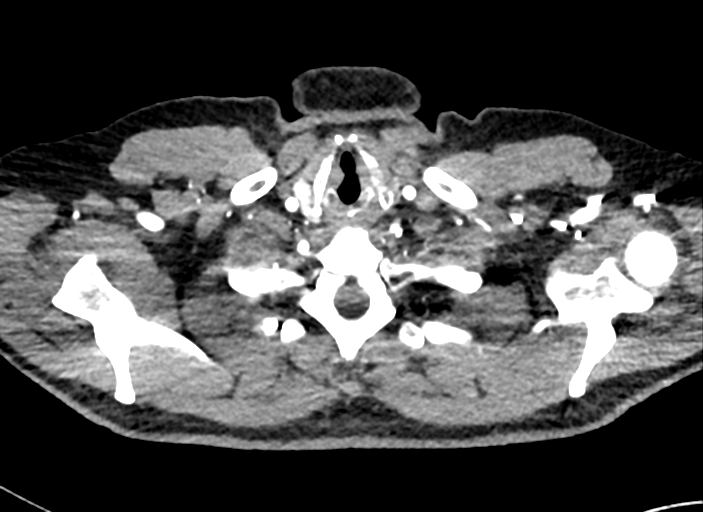

[Series 10: cta thorax 2.00 bv36 s3 cor cor st · coronal · 0.64mm/px · 1 of 133 slices shown]
[im 67/133  mediastinal]
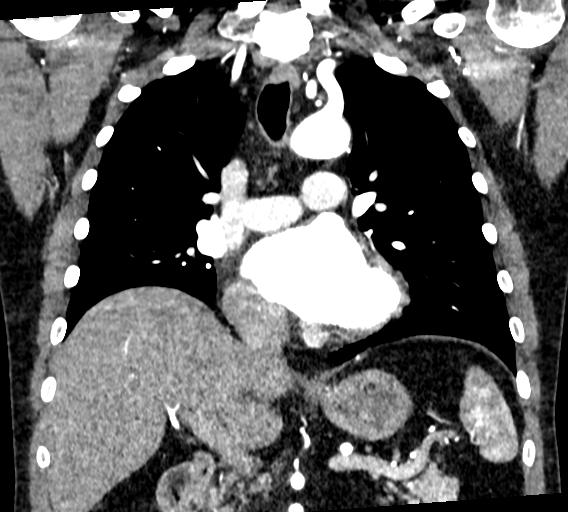

[13 of 36 positions shown; findings below may reference images not displayed]

FINDINGS: Cardiovascular: Mild four-chamber cardiac enlargement. No
pericardial effusion. Incomplete opacification of the pulmonary
arterial tree; the exam was not optimized for detection of pulmonary
emboli. Previous CABG. Good contrast opacification of the thoracic
aorta without dissection or stenosis. There is transverse diameters
as follows:

4.5 cm sinuses of Valsalva, stable

3.7 cm HJ RAMLI junction

4.6 cm proximal ascending, stable by my measurement

4.1 cm distal ascending/proximal arch

3.3 cm distal arch/proximal descending

2.8 cm distal descending

There is variant brachiocephalic arch anatomy without proximal
stenosis. Left vertebral artery arises directly from the arch.

Mediastinum/Nodes: No hilar or mediastinal adenopathy.

Lungs/Pleura: No pleural effusion. No pneumothorax. Mild dependent
atelectasis in both lower lungs. Lungs otherwise clear.

Upper Abdomen: Left renal cystic lesions, incompletely visualized.
No acute findings.

Musculoskeletal: Previous median sternotomy. Negative for fracture
or worrisome bone lesion.

Review of the MIP images confirms the above findings.
IMPRESSION: 1. Stable 4.6 cm ascending thoracic aortic aneurysm without
complicating features.

## 2017-06-26 MED ORDER — IOPAMIDOL (ISOVUE-370) INJECTION 76%
75.0000 mL | Freq: Once | INTRAVENOUS | Status: AC | PRN
  Administered 2017-06-26: 75 mL via INTRAVENOUS

## 2017-06-26 NOTE — Progress Notes (Signed)
HPI:  The patient returns for follow up of a 4.4 cm ascending aortic aneurysm found incidentally on echo and CT scan in 04/2013. I last saw him on 06/27/2016 and CTA at that time showed a 4.5 cm aortic root and a 4.3 cm fusiform ascending aortic aneurysm.  Sinotubular junction was 3.7 cm.  He has a history of coronary bypass graft surgery x5 in 2013 after undergoing PTCA of the OM 2.  He says that he has been feeling well without chest pain or shortness of breath.  He remains active.   Current Outpatient Medications  Medication Sig Dispense Refill  . Coenzyme Q10 (COQ-10 PO) Take 300 mg daily by mouth.    . doxepin (SINEQUAN) 25 MG capsule Take 25 mg at bedtime as needed by mouth (for sleep.).     [name] fish oil-omega-3 fatty acids 1000 MG capsule Take 1,000 mg daily by mouth.     . Homeopathic Products (EARACHE DROPS OT) Place 1-2 drops 3 (three) times daily as needed in ear(s) (for ear pain.).    [name] metoprolol (LOPRESSOR) 50 MG tablet Take 1 tablet (50 mg total) by mouth 2 (two) times daily. 180 tablet 3  . mometasone (NASONEX) 50 MCG/ACT nasal spray Place 2 sprays daily as needed into the nose. For allergies.  12  . triamcinolone ointment (KENALOG) 0.1 % Apply 1 application 2 (two) times daily as needed topically. For redness on chest.  1  . vitamin B-12 (CYANOCOBALAMIN) 100 MCG tablet Take 100 mcg at bedtime by mouth.     Alveda Reasons 20 MG TABS tablet TAKE 1 TABLET (20 MG TOTAL) BY MOUTH DAILY WITH SUPPER. 30 tablet 5  . zolpidem (AMBIEN) 5 MG tablet Take 2.5-5 mg at bedtime as needed by mouth. For sleep.  1  . rosuvastatin (CRESTOR) 20 MG tablet Take 1 tablet (20 mg total) by mouth daily. (Patient taking differently: Take 20 mg at bedtime by mouth. ) 90 tablet 3   No current facility-administered medications for this visit.      Physical Exam: BP (!) 142/90 (BP Location: Right Arm, Patient Position: Sitting, Cuff Size: Normal) Comment: manual cuff  Pulse 77   Resp 18   Ht 5' 9.5" (1.765  m)   Wt 188 lb 12.8 oz (85.6 kg)   SpO2 96% Comment: RA  BMI 27.48 kg/m  He looks well. Cardiac exam shows a regular rate and rhythm with normal heart sounds.  There is no murmur. Lungs are clear. There is no peripheral edema.  Diagnostic Tests:  The CTA of the chest from today is not been officially read but by my measurement the ascending aortic aneurysm measured 4.4 cm and is stable.  Impression:  He has a stable 4.4 cm fusiform ascending aortic aneurysm.  There is been no significant change compared to his prior CT scan and it is still well below the 5.5 cm surgical threshold.  He had an echocardiogram done on 12/28/2016 which I have reviewed it shows the aortic root to measure 4.1 cm.  There is trivial aortic insufficiency and preserved left ventricular ejection fraction.  His blood pressure is little bit elevated today and I stressed the importance of good blood pressure control with him and preventing any further enlargement of the aneurysm and aortic dissection.  He is on a beta-blocker for hypertension.  I reviewed the images with him and answered his questions.  Plan:  I will follow him up in 1 year with a CTA of the  chest.  I spent 15 minutes performing this established patient evaluation and > 50% of this time was spent face to face counseling and coordinating the care of this patient's aortic aneurysm.    Gaye Pollack, MD [facility] [phone]

## 2017-07-09 DIAGNOSIS — R310 Gross hematuria: Secondary | ICD-10-CM | POA: Diagnosis not present

## 2017-07-10 DIAGNOSIS — N200 Calculus of kidney: Secondary | ICD-10-CM | POA: Diagnosis not present

## 2017-07-10 DIAGNOSIS — R310 Gross hematuria: Secondary | ICD-10-CM | POA: Diagnosis not present

## 2017-07-16 ENCOUNTER — Other Ambulatory Visit: Payer: Self-pay | Admitting: Cardiology

## 2017-07-21 ENCOUNTER — Other Ambulatory Visit: Payer: Self-pay | Admitting: Cardiology

## 2017-07-21 DIAGNOSIS — E782 Mixed hyperlipidemia: Secondary | ICD-10-CM

## 2017-08-12 ENCOUNTER — Other Ambulatory Visit: Payer: Self-pay | Admitting: Cardiology

## 2017-08-12 DIAGNOSIS — M109 Gout, unspecified: Secondary | ICD-10-CM | POA: Diagnosis not present

## 2017-08-12 DIAGNOSIS — N183 Chronic kidney disease, stage 3 (moderate): Secondary | ICD-10-CM | POA: Diagnosis not present

## 2017-08-12 DIAGNOSIS — Z1389 Encounter for screening for other disorder: Secondary | ICD-10-CM | POA: Diagnosis not present

## 2017-08-12 DIAGNOSIS — I10 Essential (primary) hypertension: Secondary | ICD-10-CM | POA: Diagnosis not present

## 2017-08-12 DIAGNOSIS — I252 Old myocardial infarction: Secondary | ICD-10-CM | POA: Diagnosis not present

## 2017-08-12 DIAGNOSIS — Z125 Encounter for screening for malignant neoplasm of prostate: Secondary | ICD-10-CM | POA: Diagnosis not present

## 2017-08-12 DIAGNOSIS — I25810 Atherosclerosis of coronary artery bypass graft(s) without angina pectoris: Secondary | ICD-10-CM | POA: Diagnosis not present

## 2017-08-12 DIAGNOSIS — E782 Mixed hyperlipidemia: Secondary | ICD-10-CM | POA: Diagnosis not present

## 2017-08-12 DIAGNOSIS — I4891 Unspecified atrial fibrillation: Secondary | ICD-10-CM | POA: Diagnosis not present

## 2017-08-12 DIAGNOSIS — G4700 Insomnia, unspecified: Secondary | ICD-10-CM | POA: Diagnosis not present

## 2017-08-12 DIAGNOSIS — R7303 Prediabetes: Secondary | ICD-10-CM | POA: Diagnosis not present

## 2017-08-12 DIAGNOSIS — I712 Thoracic aortic aneurysm, without rupture: Secondary | ICD-10-CM | POA: Diagnosis not present

## 2017-08-12 DIAGNOSIS — Z0000 Encounter for general adult medical examination without abnormal findings: Secondary | ICD-10-CM | POA: Diagnosis not present

## 2017-08-13 DIAGNOSIS — I77810 Thoracic aortic ectasia: Secondary | ICD-10-CM | POA: Insufficient documentation

## 2017-08-13 NOTE — Progress Notes (Signed)
Cardiology Office Note    Date:  08/14/2017   ID:  Sean Willis, DOB January 16, 1945, MRN [id number]  PCP:  Sean Low, MD  Cardiologist: Sean Him, MD  Chief Complaint  Patient presents with  . Follow-up    History of Present Illness:  Sean Willis is a 72 y.o. male with history of CAD status post MI treated with PTCA of the OM 2 and subsequent CABG x4 in 2013 with LIMA to the LAD, SVG to diagonal 1, SVG to OM1/R1, SVG to distal RCA [facility] also has history of persistent atrial fibrillation status post DCCV 01/2016 and 03/2016 on Xarelto for CHA2DS2-VASc score of 3.  He had recurrent atrial fibrillation and did not want any further antiarrhythmics and is in permanent atrial fibrillation now.  Patient also has moderately dilated aortic root 46 mm with moderate MR followed by Dr. Jerrilyn Willis, severe OSA on CPAP, hypertension, HLD.  Patient last saw Dr. Radford Willis 09/2016 at which time he was doing well.  2D echo ascending aorta stable at 4.6 cm, normal LV function EF 55 to 60% aortic valve sclerosis.CT 06/2017 ascending aorta stable 4.6 cm.  Patient comes in for yearly f/u. Works on a farm, Hydrologist once a week, gets 8000-10,000 steps/day, remodeling a house, farms. Heart rate usually 60-70's. Denies chest pain, palpitations,dyspnea, dyspnea on exertion, dizziness or presyncope. Had muscle aches on crestor which was switched to pravastatin.  He says his levels were higher when checked by PCP.  Also was having some kidney problems and told to drink more water.  We can't access these labs but will try to get them.  Past Medical History:  Diagnosis Date  . Benign essential HTN 11/24/2013  . BPH (benign prostatic hyperplasia)   . CAD (coronary artery disease) 01/23/2013  . CAD (coronary artery disease), native coronary artery 04/2011   S[place]  . Carotid arterial disease (Sean Willis) 11/24/2014  . Carotid artery stenosis    1-39% bilateral by dopplers  2018  . Chest wall pain    Right side, muscular,ortho,MRI. U/S abd ok. On lyrica  . Chronic insomnia   . Colon polyp 10/11  . Coronary artery disease 04/2011   s/p CABG in setting of MI in [place], [place], with initial PTCA of OM2 then CABG with LIMA to LAD, SVG to D1, SVG to OM1,/R1 and SVG to distal RCA  . DDD (degenerative disc disease), lumbar   . Dilatation of aorta (HCC)    4.5 cm ascending aortic aneurysm per chets ct 05-18-15 epic  . Dysrhythmia   . ED (erectile dysfunction)   . GERD (gastroesophageal reflux disease)   . Gout   . Gout   . History of kidney stones   . Hypertension    since mi in 2013 no htn  . Hypertriglyceridemia   . Kidney stone    uric acid, Dr Sean Willis  renal insufficiency  . Mitral regurgitation 06/04/2016   Moderate by echo 2017  . Mixed hyperlipidemia 01/23/2013  . Myocardial infarction ([place])    2013  . OSA on CPAP 07/16/2016  . Osteoarthritis    of the knee  . Persistent atrial fibrillation ([place])    s/p DCCV 01/2016.  CHASDS2VASC score is 3  . Sleep apnea     Past Surgical History:  Procedure Laterality Date  . CARDIOVERSION [name] 01/20/2016   Procedure: CARDIOVERSION;  Surgeon: Sean Headings, MD;  Location: [facility];  Service: Cardiovascular;  Laterality: [name];  .  CARDIOVERSION [name] 03/23/2016   Procedure: CARDIOVERSION;  Surgeon: Sean Margarita, MD;  Location: [facility];  Service: Cardiovascular;  Laterality: [name];  . CARDIOVERSION    . CATARACT EXTRACTION W/PHACO Right 10/26/2014   Procedure: CATARACT EXTRACTION PHACO AND INTRAOCULAR LENS PLACEMENT (IOC);  Surgeon: Sean Robson, MD;  Location: [facility];  Service: Ophthalmology;  Laterality: Right;  LOT PACK: [id number] H US:01:43.0 AP:27.8 CDE:28.68  . CATARACT EXTRACTION W/PHACO Left 11/13/2016   Procedure: CATARACT EXTRACTION PHACO AND INTRAOCULAR LENS PLACEMENT (IOC);  Surgeon: Sean Robson, MD;  Location: [facility];  Service: Ophthalmology;  Laterality: Left;  [place] 01:00.2 AP% 15.7 CDE  9.47 Fluid Pack lot # [id number]  . COLONOSCOPY WITH PROPOFOL [name] 09/27/2015   Procedure: COLONOSCOPY WITH PROPOFOL;  Surgeon: Sean Fair, MD;  Location: [facility];  Service: Endoscopy;  Laterality: [name];  . CORONARY ARTERY BYPASS GRAFT  04/2011   Severe multivessel ASCAD s/p Stemi w PTCA of OM2 then s/p CABG w LIMA to LAD,SVG to OM1 /RI and SVG to Distal RCA   . FINGER SURGERY     left finger for ganglion cyst  . KIDNEY STONE SURGERY      Current Medications: Current Meds  Medication Sig  . Coenzyme Q10 (COQ-10 PO) Take 300 mg daily by mouth.  . doxepin (SINEQUAN) 25 MG capsule Take 25 mg at bedtime as needed by mouth (for sleep.).   [name] fish oil-omega-3 fatty acids 1000 MG capsule Take 1,000 mg daily by mouth.   . Homeopathic Products (EARACHE DROPS OT) Place 1-2 drops 3 (three) times daily as needed in ear(s) (for ear pain.).  [name] metoprolol tartrate (LOPRESSOR) 50 MG tablet PLEASE SEE ATTACHED FOR DETAILED DIRECTIONS  . mometasone (NASONEX) 50 MCG/ACT nasal spray Place 2 sprays daily as needed into the nose. For allergies.  . pravastatin (PRAVACHOL) 80 MG tablet Take 80 mg by mouth daily.  [name] triamcinolone ointment (KENALOG) 0.1 % Apply 1 application 2 (two) times daily as needed topically. For redness on chest.  . vitamin B-12 (CYANOCOBALAMIN) 100 MCG tablet Take 100 mcg at bedtime by mouth.   Alveda Reasons 20 MG TABS tablet TAKE 1 TABLET (20 MG TOTAL) BY MOUTH DAILY WITH SUPPER.  [name] zolpidem (AMBIEN) 5 MG tablet Take 2.5-5 mg at bedtime as needed by mouth. For sleep.     Allergies:   Celebrex [celecoxib]   Social History   Socioeconomic History  . Marital status: Married    Spouse name: Not on file  . Number of children: Not on file  . Years of education: Not on file  . Highest education level: Not on file  Occupational History  . Not on file  Social Needs  . Financial resource strain: Not on file  . Food insecurity:    Worry: Not on file    Inability: Not on file  .  Transportation needs:    Medical: Not on file    Non-medical: Not on file  Tobacco Use  . Smoking status: Never Smoker  . Smokeless tobacco: Never Used  Substance and Sexual Activity  . Alcohol use: Yes    Comment: yes occassionally  . Drug use: No  . Sexual activity: Not on file  Lifestyle  . Physical activity:    Days per week: Not on file    Minutes per session: Not on file  . Stress: Not on file  Relationships  . Social connections:    Talks on phone: Not on file    Gets together: Not on  file    Attends religious service: Not on file    Active member of club or organization: Not on file    Attends meetings of clubs or organizations: Not on file    Relationship status: Not on file  Other Topics Concern  . Not on file  Social History Narrative  . Not on file     Family History:  The patient's family history includes Alzheimer's disease in his mother; Arrhythmia in his father and sister; CVA in his father; Cancer in his mother.   ROS:   Please see the history of present illness.    Review of Systems  Constitution: Negative.  HENT: Negative.   Cardiovascular: Negative.   Respiratory: Negative.   Endocrine: Negative.   Hematologic/Lymphatic: Negative.   Musculoskeletal: Negative.   Gastrointestinal: Negative.   Genitourinary: Negative.   Neurological: Negative.    All other systems reviewed and are negative.   PHYSICAL EXAM:   VS:  BP 122/86   Pulse 62   Ht 5' 9.5" (1.765 m)   Wt 187 lb (84.8 kg)   SpO2 97%   BMI 27.22 kg/m   Physical Exam  GEN: Well nourished, well developed, in no acute distress  Neck: no JVD, carotid bruits, or masses Cardiac:RRR; no murmurs, rubs, or gallops  Respiratory:  clear to auscultation bilaterally, normal work of breathing GI: soft, nontender, nondistended, + BS Ext: without cyanosis, clubbing, or edema, Good distal pulses bilaterally Neuro:  Alert and Oriented x 3 Psych: euthymic mood, full affect  Wt Readings from Last 3  Encounters:  08/14/17 187 lb (84.8 kg)  06/26/17 188 lb 12.8 oz (85.6 kg)  01/04/17 190 lb (86.2 kg)      Studies/Labs Reviewed:   EKG:  EKG is not ordered today.  EKG reviewed from January 2019 and was in atrial fibrillation with controlled ventricular rate Recent Labs: 01/04/2017: ALT 37; BUN 20; Creatinine, Ser 1.35; Hemoglobin 15.4; Platelets 149; Potassium 3.8; Sodium 138   Lipid Panel    Component Value Date/Time   CHOL 127 09/18/2016 0924   CHOL 123 01/14/2013 0923   TRIG 151 (H) 09/18/2016 0924   TRIG 141 01/14/2013 0923   HDL 27 (L) 09/18/2016 0924   HDL 34 (L) 01/14/2013 0923   CHOLHDL 4.7 09/18/2016 0924   CHOLHDL 5.8 (H) 12/20/2015 0914   VLDL 38 (H) 12/20/2015 0914   LDLCALC 70 09/18/2016 0924   LDLCALC 61 01/14/2013 0923    Additional studies/ records that were reviewed today include:  Carotid Dopplers 12/2018Right Carotid: There is no evidence of stenosis in the right ICA.  Left Carotid: There is evidence in the left ICA of a 1-39% stenosis.  Vertebrals:  Both vertebral arteries were patent with antegrade flow. Subclavians: Normal flow hemodynamics were seen in bilateral subclavian              arteries.  *See table(s) above for measurements and observations.    Electronically signed by Larae Grooms on 12/19/2016 at 12:45:01 PM.    2D echo 12/2018Study Conclusions   - Left ventricle: The cavity size was normal. Wall thickness was   normal. Systolic function was normal. The estimated ejection   fraction was in the range of 55% to 60%. Wall motion was normal;   there were no regional wall motion abnormalities. The study is   not technically sufficient to allow evaluation of LV diastolic   function. - Aortic valve: Trileaflet. Sclerosis without stenosis. There was   trivial regurgitation. - Aorta:  Aortic root dimension: 41 mm (ED). Ascending aortic   diameter: 46 mm (S). - Aortic root: The aortic root is mildly dilated. - Ascending aorta: The  ascending aorta was moderately dilated. - Mitral valve: Mildly thickened leaflets . There was mild   regurgitation. - Left atrium: Moderately dilated. - Right ventricle: The cavity size was mildly dilated. Moderately   reduced systolic function. - Right atrium: The atrium was mildly dilated. - Atrial septum: No defect or patent foramen ovale was identified. - Tricuspid valve: There was trivial regurgitation. - Pulmonary arteries: PA peak pressure: 31 mm Hg (S). - Inferior vena cava: The vessel was dilated. The respirophasic   diameter changes were blunted (< 50%), consistent with elevated   central venous pressure.   Impressions:   - Compared to a prior study in 2017, there are few changes. The   ascending aorta is stable at 4.6 cm.   CTA of chest aorta 06/2017 IMPRESSION: 1. Stable 4.6 cm ascending thoracic aortic aneurysm without complicating features.     Electronically Signed   By: Lucrezia Europe M.D.   On: 06/26/2017 13:31   ASSESSMENT:    1. Persistent atrial fibrillation ([place])   2. Coronary artery disease involving native coronary artery of native heart without angina pectoris   3. Mixed hyperlipidemia   4. OSA on CPAP   5. Aortic root dilatation (HCC)   6. Benign essential HTN      PLAN:  In order of problems listed above:  Persistent atrial fibrillation rate controlled on metoprolol and also on Xarelto.  Recent lab work by PCP creatinine was up to 1.49 and he was told to drink more water and repeat labs in a month.  May need to adjust Xarelto if he continues to have renal insufficiency.  Have requested labs to be sent to our office.  CAD status post CABG x3 [place] doing well without angina  Mixed hyperlipidemia recent LDL 74 triglycerides 296 on pravastatin 80 mg daily.  Will refer to lipid clinic for further treatment.  He is interested in PCSK9 inhibitor  OSA on CPAP  Aortic root dilatation 4.6 cm on CT 06/2017 followed by Dr.  Mohammed Kindle     Medication Adjustments/Labs and Tests Ordered: Current medicines are reviewed at length with the patient today.  Concerns regarding medicines are outlined above.  Medication changes, Labs and Tests ordered today are listed in the Patient Instructions below. Patient Instructions  Medication Instructions: Your physician recommends that you continue on your current medications as directed. Please refer to the Current Medication list given to you today.   Labwork: None Ordered  Procedures/Testing: None Ordered  Follow-Up: Your physician wants you to follow-up in: 1 year with Dr.Turner You will receive a reminder letter in the mail two months in advance. If you don't receive a letter, please call our office to schedule the follow-up appointment.  You physician has referred you to our lipid clinic    Any Additional Special Instructions Will Be Listed Below (If Applicable).     If you need a refill on your cardiac medications before your next appointment, please call your pharmacy. '    Signed, Ermalinda Barrios, PA-C  08/14/2017 9:30 AM    [address], [place], [place]  [address] Phone: [phone]; Fax: [phone]

## 2017-08-14 ENCOUNTER — Encounter: Payer: Self-pay | Admitting: Physician Assistant

## 2017-08-14 ENCOUNTER — Ambulatory Visit (INDEPENDENT_AMBULATORY_CARE_PROVIDER_SITE_OTHER): Payer: Medicare Other | Admitting: Physician Assistant

## 2017-08-14 VITALS — BP 122/86 | HR 62 | Ht 69.5 in | Wt 187.0 lb

## 2017-08-14 DIAGNOSIS — I77810 Thoracic aortic ectasia: Secondary | ICD-10-CM | POA: Diagnosis not present

## 2017-08-14 DIAGNOSIS — I10 Essential (primary) hypertension: Secondary | ICD-10-CM

## 2017-08-14 DIAGNOSIS — I2510 Atherosclerotic heart disease of native coronary artery without angina pectoris: Secondary | ICD-10-CM | POA: Diagnosis not present

## 2017-08-14 DIAGNOSIS — I4819 Other persistent atrial fibrillation: Secondary | ICD-10-CM

## 2017-08-14 DIAGNOSIS — E782 Mixed hyperlipidemia: Secondary | ICD-10-CM

## 2017-08-14 DIAGNOSIS — I481 Persistent atrial fibrillation: Secondary | ICD-10-CM | POA: Diagnosis not present

## 2017-08-14 DIAGNOSIS — G4733 Obstructive sleep apnea (adult) (pediatric): Secondary | ICD-10-CM

## 2017-08-14 DIAGNOSIS — Z9989 Dependence on other enabling machines and devices: Secondary | ICD-10-CM

## 2017-08-14 NOTE — Patient Instructions (Addendum)
Medication Instructions: Your physician recommends that you continue on your current medications as directed. Please refer to the Current Medication list given to you today.   Labwork: None Ordered  Procedures/Testing: None Ordered  Follow-Up: Your physician wants you to follow-up in: 1 year with Dr.Turner You will receive a reminder letter in the mail two months in advance. If you don't receive a letter, please call our office to schedule the follow-up appointment.  You physician has referred you to our lipid clinic    Any Additional Special Instructions Will Be Listed Below (If Applicable).     If you need a refill on your cardiac medications before your next appointment, please call your pharmacy. '

## 2017-09-10 ENCOUNTER — Ambulatory Visit: Payer: Medicare Other

## 2017-09-14 ENCOUNTER — Other Ambulatory Visit: Payer: Self-pay | Admitting: Cardiology

## 2017-09-17 DIAGNOSIS — N289 Disorder of kidney and ureter, unspecified: Secondary | ICD-10-CM | POA: Diagnosis not present

## 2017-09-26 ENCOUNTER — Ambulatory Visit (INDEPENDENT_AMBULATORY_CARE_PROVIDER_SITE_OTHER): Payer: Medicare Other | Admitting: Pharmacist

## 2017-09-26 ENCOUNTER — Encounter: Payer: Self-pay | Admitting: Pharmacist

## 2017-09-26 DIAGNOSIS — I2510 Atherosclerotic heart disease of native coronary artery without angina pectoris: Secondary | ICD-10-CM | POA: Diagnosis not present

## 2017-09-26 DIAGNOSIS — E782 Mixed hyperlipidemia: Secondary | ICD-10-CM | POA: Diagnosis not present

## 2017-09-26 DIAGNOSIS — Z961 Presence of intraocular lens: Secondary | ICD-10-CM | POA: Diagnosis not present

## 2017-09-26 LAB — LIPID PANEL
CHOL/HDL RATIO: 4.8 ratio (ref 0.0–5.0)
Cholesterol, Total: 152 mg/dL (ref 100–199)
HDL: 32 mg/dL — AB (ref >39)
LDL Calculated: 84 mg/dL (ref 0–99)
TRIGLYCERIDES: 181 mg/dL — AB (ref 0–149)
VLDL Cholesterol Cal: 36 mg/dL (ref 5–40)

## 2017-09-26 MED ORDER — PRAVASTATIN SODIUM 40 MG PO TABS: 60.0000 mg | ORAL_TABLET | Freq: Every day | ORAL | 3 refills | Status: DC

## 2017-09-26 NOTE — Progress Notes (Signed)
Patient ID: Sean Willis                 DOB: Nov 23, 1945                    MRN: [id number]     HPI: Sean Willis is a 72 y.o. male patient of [name] that presents today for lipid evaluation.  PMH includes CAD status post MI treated with PTCA of the OM 2 and subsequent CABG x4 in 2013 with LIMA to the LAD, SVG to diagonal 1, SVG to OM1/R1, SVG to distal RCA [facility] also has history of persistent atrial fibrillation status post DCCV 01/2016, hypertension, and HLD.   He presents today for discussion of cholesterol. He was not fasting when he had most recent cholesterol panel, but he is fasting today. He states he has been taking 40mg  pravastatin daily. He took the 80mg  of pravastatin for some time, but then decreased back to the 40mg  due to muscle pains and gout like symptoms. He has never tried ezetimibe therapy.   Risk Factors: CAD s/p MI LDL Goal: <70, nonHDL <100  Current Medications: fish oil 1000mg  daily, pravastatin 40mg  daily Intolerances: rosuvastatin 20mg  daily  Diet: Most meals from out. He eats from cafeteria and steak and fast food and italians. He eats chicken steak hamburgers and hot dog. He does not eat a lot of carbs. He does eat more bread than he should. He does not eat too heavy on sweets. He drinks mostly diet drinks and unsweet tea and water.   Exercise: He tries to walk daily.   Family History: Alzheimer's disease in his mother; Arrhythmia in his father and sister; CVA in his father; Cancer in his mother.  Social History: denies tobacco. Occasional alcohol  Labs: 08/12/17:  TC 160, TG 296, HDL 27, LDL 74, nonHDL 134 (pravastain 40mg  daily, fish oil 1000mg  daily)  Past Medical History:  Diagnosis Date  . Benign essential HTN 11/24/2013  . BPH (benign prostatic hyperplasia)   . CAD (coronary artery disease) 01/23/2013  . CAD (coronary artery disease), native coronary artery 04/2011   S/P CAB[place] X4 [place]  . Carotid  arterial disease ([place]) 11/24/2014  . Carotid artery stenosis    1-39% bilateral by dopplers 2018  . Chest wall pain    Right side, muscular,ortho,MRI. U/S abd ok. On lyrica  . Chronic insomnia   . Colon polyp 10/11  . Coronary artery disease 04/2011   s/p CABG in setting of MI in [place], [place], with initial PTCA of OM2 then CABG with LIMA to LAD, SVG to D1, SVG to OM1,/R1 and SVG to distal RCA  . DDD (degenerative disc disease), lumbar   . Dilatation of aorta (HCC)    4.5 cm ascending aortic aneurysm per chets ct 05-18-15 epic  . Dysrhythmia   . ED (erectile dysfunction)   . GERD (gastroesophageal reflux disease)   . Gout   . Gout   . History of kidney stones   . Hypertension    since mi in 2013 no htn  . Hypertriglyceridemia   . Kidney stone    uric acid, Dr [name]  renal insufficiency  . Mitral regurgitation 06/04/2016   Moderate by echo 2017  . Mixed hyperlipidemia 01/23/2013  . Myocardial infarction ([place])    2013  . OSA on CPAP 07/16/2016  . Osteoarthritis    of the knee  . Persistent atrial fibrillation (HCC)    s/p  DCCV 01/2016.  CHASDS2VASC score is 3  . Sleep apnea     Current Outpatient Medications on File Prior to Visit  Medication Sig Dispense Refill  . Coenzyme Q10 (COQ-10 PO) Take 300 mg daily by mouth.    . doxepin (SINEQUAN) 25 MG capsule Take 25 mg at bedtime as needed by mouth (for sleep.).     [name] fish oil-omega-3 fatty acids 1000 MG capsule Take 1,000 mg daily by mouth.     . Homeopathic Products (EARACHE DROPS OT) Place 1-2 drops 3 (three) times daily as needed in ear(s) (for ear pain.).    [name] metoprolol tartrate (LOPRESSOR) 50 MG tablet TAKE 1 TABLET BY MOUTH TWICE A DAY 180 tablet 3  . mometasone (NASONEX) 50 MCG/ACT nasal spray Place 2 sprays daily as needed into the nose. For allergies.  12  . pravastatin (PRAVACHOL) 80 MG tablet Take 80 mg by mouth daily.    [name] triamcinolone ointment (KENALOG) 0.1 % Apply 1 application 2 (two) times daily as needed  topically. For redness on chest.  1  . vitamin B-12 (CYANOCOBALAMIN) 100 MCG tablet Take 100 mcg at bedtime by mouth.     Alveda Reasons 20 MG TABS tablet TAKE 1 TABLET (20 MG TOTAL) BY MOUTH DAILY WITH SUPPER. 30 tablet 5  . zolpidem (AMBIEN) 5 MG tablet Take 2.5-5 mg at bedtime as needed by mouth. For sleep.  1   No current facility-administered medications on file prior to visit.     Allergies  Allergen Reactions  . Celebrex [Celecoxib]     Upset stomach    Assessment/Plan: Hyperlipidemia: LDL is not at goal. We will repeat lipid panel today since he is fasting to evaluate TG. Discussed options including increasing pravastatin, ezetimibe, and PCSK9i therapy. He is willing to increase pravastatin to 60mg  daily to see if tolerated. This will likely bring LDL close to goal. If does not tolerate or LDL not at goal will consider addition of ezetimibe before PCSK9i therapy as he would like to avoid injections if possible. Will plan to follow up in 1 month for tolerability and schedule lipid panel at that time to assess efficacy.    Thank you,  Lelan Pons. Patterson Hammersmith, [facility]  09/26/2017 8:41 AM

## 2017-09-26 NOTE — Patient Instructions (Signed)
We will get lab work today. (Lipid panel)   INCREASE pravastatin to 60mg  daily.  If you have any issues tolerating the pravastatin please call the clinic [phone]   Cholesterol Cholesterol is a fat. Your body needs a small amount of cholesterol. Cholesterol (plaque) may build up in your blood vessels (arteries). That makes you more likely to have a heart attack or stroke. You cannot feel your cholesterol level. Having a blood test is the only way to find out if your level is high. Keep your test results. Work with your doctor to keep your cholesterol at a good level. What do the results mean?  Total cholesterol is how much cholesterol is in your blood.  LDL is bad cholesterol. This is the type that can build up. Try to have low LDL.  HDL is good cholesterol. It cleans your blood vessels and carries LDL away. Try to have high HDL.  Triglycerides are fat that the body can store or burn for energy. What are good levels of cholesterol?  Total cholesterol below 200.  LDL below 100 is good for people who have health risks. LDL below 70 is good for people who have very high risks.  HDL above 40 is good. It is best to have HDL of 60 or higher.  Triglycerides below 150. How can I lower my cholesterol? Diet Follow your diet program as told by your doctor.  Choose fish, white meat chicken, or [place] that is roasted or baked. Try not to eat red meat, fried foods, sausage, or lunch meats.  Eat lots of fresh fruits and vegetables.  Choose whole grains, beans, pasta, potatoes, and cereals.  Choose olive oil, corn oil, or canola oil. Only use small amounts.  Try not to eat butter, mayonnaise, shortening, or palm kernel oils.  Try not to eat foods with trans fats.  Choose low-fat or nonfat dairy foods. ? Drink skim or nonfat milk. ? Eat low-fat or nonfat yogurt and cheeses. ? Try not to drink whole milk or cream. ? Try not to eat ice cream, egg yolks, or full-fat  cheeses.  Healthy desserts include angel food cake, ginger snaps, animal crackers, hard candy, popsicles, and low-fat or nonfat frozen yogurt. Try not to eat pastries, cakes, pies, and cookies.  Exercise Follow your exercise program as told by your doctor.  Be more active. Try gardening, walking, and taking the stairs.  Ask your doctor about ways that you can be more active.  Medicine  Take over-the-counter and prescription medicines only as told by your doctor. This information is not intended to replace advice given to you by your health care provider. Make sure you discuss any questions you have with your health care provider. Document Released: 03/16/2008 Document Revised: 07/20/2015 Document Reviewed: 06/30/2015 Elsevier Interactive Patient Education  [employer].

## 2017-10-10 ENCOUNTER — Telehealth: Payer: Self-pay

## 2017-10-10 NOTE — Telephone Encounter (Signed)
Spoke with the patient, he had an event on 10/07, where he had an issue with forming complete sentences. He mumbled, but he was cognitively aware of the words. He did not have any other symptoms. He used to have migraines in the past, but that was 30 years ago. The mumbling occurred with the migraines but he was missing the other symptoms of headache, vision blurring and weakness. The patient has felt fine since this event the other day. The event lasted 2 hours. Advised the patient if is recurs to let our office know and that he should seek out treatment. The patient stated he was going to go to the hospital, but he got better and had no lasting effects. He denies any changes in lifestyle, asked the patient if he felt like he had low blood pressure, he said that could have been a factor.   Sending to [name] for reference.

## 2017-10-10 NOTE — Telephone Encounter (Signed)
He needs to see PCP tomorrow

## 2017-10-14 NOTE — Telephone Encounter (Signed)
Spoke with the patient and he accepted making an appointment with his PCP.

## 2017-10-29 ENCOUNTER — Telehealth: Payer: Self-pay | Admitting: Pharmacist

## 2017-10-29 DIAGNOSIS — E782 Mixed hyperlipidemia: Secondary | ICD-10-CM

## 2017-10-29 NOTE — Telephone Encounter (Signed)
Spoke with patient to follow up on tolerability of pravastatin. He is tolerating 60mg  daily well. Will repeat lipid panel on higher dose to determine need for additional therapy.

## 2017-11-25 ENCOUNTER — Other Ambulatory Visit: Payer: Self-pay | Admitting: Cardiology

## 2017-12-03 ENCOUNTER — Other Ambulatory Visit: Payer: Medicare Other

## 2017-12-03 DIAGNOSIS — E782 Mixed hyperlipidemia: Secondary | ICD-10-CM | POA: Diagnosis not present

## 2017-12-03 LAB — HEPATIC FUNCTION PANEL
ALBUMIN: 4.2 g/dL (ref 3.5–4.8)
ALK PHOS: 70 IU/L (ref 39–117)
ALT: 25 IU/L (ref 0–44)
AST: 23 IU/L (ref 0–40)
BILIRUBIN, DIRECT: 0.17 mg/dL (ref 0.00–0.40)
Bilirubin Total: 0.8 mg/dL (ref 0.0–1.2)
TOTAL PROTEIN: 6.5 g/dL (ref 6.0–8.5)

## 2017-12-03 LAB — LIPID PANEL
CHOL/HDL RATIO: 4 ratio (ref 0.0–5.0)
Cholesterol, Total: 124 mg/dL (ref 100–199)
HDL: 31 mg/dL — ABNORMAL LOW (ref >39)
LDL Calculated: 63 mg/dL (ref 0–99)
Triglycerides: 151 mg/dL — ABNORMAL HIGH (ref 0–149)
VLDL Cholesterol Cal: 30 mg/dL (ref 5–40)

## 2017-12-12 ENCOUNTER — Telehealth: Payer: Self-pay

## 2017-12-12 DIAGNOSIS — E782 Mixed hyperlipidemia: Secondary | ICD-10-CM

## 2017-12-12 NOTE — Telephone Encounter (Signed)
Notes recorded by Sarina Ill, RN on 12/12/2017 at 8:36 AM EST Advised the patient on reduction of sugars. ------  Notes recorded by Sueanne Margarita, MD on 12/10/2017 at 7:50 PM EST Needs to cut back on carbs and sugars ------  Notes recorded by Sarina Ill, RN on 12/10/2017 at 3:58 PM EST Spoke with the patient, he did not want to add anymore medication since he is close to normal range. He stated he will continue to do what he is doing and see if his fasting labs are better. ------

## 2017-12-12 NOTE — Telephone Encounter (Signed)
Attempted to call will call later

## 2017-12-12 NOTE — Telephone Encounter (Signed)
-----   Message from Sueanne Margarita, MD sent at 12/03/2017  6:21 PM EST ----- LDL at goal now but TAGs are still high.  Add Vascepa 2gm BID and repeat FLP in 3 months

## 2017-12-13 NOTE — Telephone Encounter (Signed)
Spoke with the patient, he is scheduled for repeated fasting labs on 3/19.

## 2017-12-13 NOTE — Telephone Encounter (Signed)
Yes please

## 2018-02-13 DIAGNOSIS — I252 Old myocardial infarction: Secondary | ICD-10-CM | POA: Diagnosis not present

## 2018-02-13 DIAGNOSIS — G4700 Insomnia, unspecified: Secondary | ICD-10-CM | POA: Diagnosis not present

## 2018-02-13 DIAGNOSIS — E782 Mixed hyperlipidemia: Secondary | ICD-10-CM | POA: Diagnosis not present

## 2018-02-13 DIAGNOSIS — I4891 Unspecified atrial fibrillation: Secondary | ICD-10-CM | POA: Diagnosis not present

## 2018-02-13 DIAGNOSIS — N183 Chronic kidney disease, stage 3 (moderate): Secondary | ICD-10-CM | POA: Diagnosis not present

## 2018-02-13 DIAGNOSIS — R7303 Prediabetes: Secondary | ICD-10-CM | POA: Diagnosis not present

## 2018-02-13 DIAGNOSIS — M109 Gout, unspecified: Secondary | ICD-10-CM | POA: Diagnosis not present

## 2018-02-13 DIAGNOSIS — I2510 Atherosclerotic heart disease of native coronary artery without angina pectoris: Secondary | ICD-10-CM | POA: Diagnosis not present

## 2018-02-13 DIAGNOSIS — I712 Thoracic aortic aneurysm, without rupture: Secondary | ICD-10-CM | POA: Diagnosis not present

## 2018-02-13 DIAGNOSIS — I10 Essential (primary) hypertension: Secondary | ICD-10-CM | POA: Diagnosis not present

## 2018-02-13 DIAGNOSIS — I25810 Atherosclerosis of coronary artery bypass graft(s) without angina pectoris: Secondary | ICD-10-CM | POA: Diagnosis not present

## 2018-02-24 ENCOUNTER — Ambulatory Visit (INDEPENDENT_AMBULATORY_CARE_PROVIDER_SITE_OTHER): Payer: Medicare Other | Admitting: Neurology

## 2018-02-24 ENCOUNTER — Encounter: Payer: Self-pay | Admitting: Neurology

## 2018-02-24 ENCOUNTER — Telehealth: Payer: Self-pay | Admitting: Neurology

## 2018-02-24 VITALS — BP 144/99 | HR 80 | Ht 69.0 in | Wt 186.0 lb

## 2018-02-24 DIAGNOSIS — G459 Transient cerebral ischemic attack, unspecified: Secondary | ICD-10-CM

## 2018-02-24 NOTE — Telephone Encounter (Signed)
Spoke to the patient he is aware.  °

## 2018-02-24 NOTE — Telephone Encounter (Signed)
Medicare/bcbs fed no auth order sent to GI. They will reach out to the pt to schedule.

## 2018-02-24 NOTE — Progress Notes (Signed)
Reason for visit: TIA  Sean Willis is a 73 y.o. male  History of present illness:  Sean Willis is a 73 year old right-handed white male with a history of atrial fibrillation on Xarelto, coronary artery disease, sleep apnea on CPAP, hypertension, and dyslipidemia.  The patient had an event on 08 October 2017.  The patient had been mowing the grass, he came in to have supper and then had sudden onset of mutism.  The patient did not go to the hospital as his wife has primary progressive aphasia and she is unable speak as well, he was concerned that he could not communicate to the doctors as what the problem was.  The patient instead went to [place] for dinner, he was able to scribble down "chicken" on a piece of paper, and he was able to order dinner.  The episode lasted about 4 hours, and then finally cleared.  The patient claims that when he was younger he used to have migraine headaches that were associated with speech alteration, this was usually with slurred speech and associated with a severe headache, he claims that this event was completely different from his usual migraine which he has not had in many years.  The patient reported no numbness or weakness of the face, arms, legs.  He had no visual disturbance, he had no headache or dizziness.  Fortunately, the event has completely cleared, he has not had any recurrence of this.  The patient comes to this office for an evaluation.  Past Medical History:  Diagnosis Date  . Benign essential HTN 11/24/2013  . BPH (benign prostatic hyperplasia)   . CAD (coronary artery disease) 01/23/2013  . CAD (coronary artery disease), native coronary artery 04/2011   S/P CABG X4 S[place]  . Carotid arterial disease ([place]) 11/24/2014  . Carotid artery stenosis    1-39% bilateral by dopplers 2018  . Chest wall pain    Right side, muscular,ortho,MRI. U/S abd ok. On lyrica  . Chronic insomnia   . Colon polyp 10/11  . Coronary artery  disease 04/2011   s/p CABG in setting of MI in [place], [place], with initial PTCA of OM2 then CABG with LIMA to LAD, SVG to D1, SVG to OM1,/R1 and SVG to distal RCA  . DDD (degenerative disc disease), lumbar   . Dilatation of aorta ([facility])    4.5 cm ascending aortic aneurysm per chets ct 05-18-15 epic  . Dysrhythmia   . ED (erectile dysfunction)   . GERD (gastroesophageal reflux disease)   . Gout   . Gout   . History of kidney stones   . Hypertension    since mi in 2013 no htn  . Hypertriglyceridemia   . Kidney stone    uric acid, Dr [name]  renal insufficiency  . Mitral regurgitation 06/04/2016   Moderate by echo 2017  . Mixed hyperlipidemia 01/23/2013  . Myocardial infarction ([place])    2013  . OSA on CPAP 07/16/2016  . Osteoarthritis    of the knee  . Persistent atrial fibrillation    s/p DCCV 01/2016.  CHASDS2VASC score is 3  . Sleep apnea     Past Surgical History:  Procedure Laterality Date  . CARDIOVERSION [name] 01/20/2016   Procedure: CARDIOVERSION;  Surgeon: Thayer Headings, MD;  Location: [facility];  Service: Cardiovascular;  Laterality: [name];  . CARDIOVERSION [name] 03/23/2016   Procedure: CARDIOVERSION;  Surgeon: Sueanne Margarita, MD;  Location: [facility];  Service: Cardiovascular;  Laterality: [name];  . CARDIOVERSION    . CATARACT EXTRACTION W/PHACO Right 10/26/2014   Procedure: CATARACT EXTRACTION PHACO AND INTRAOCULAR LENS PLACEMENT (IOC);  Surgeon: [name], MD;  Location: [facility];  Service: Ophthalmology;  Laterality: Right;  LOT PACK: [id number] H US:01:43.0 AP:27.8 CDE:28.68  . CATARACT EXTRACTION W/PHACO Left 11/13/2016   Procedure: CATARACT EXTRACTION PHACO AND INTRAOCULAR LENS PLACEMENT (IOC);  Surgeon: [name], MD;  Location: [facility];  Service: Ophthalmology;  Laterality: Left;  [place] 01:00.2 AP% 15.7 CDE 9.47 Fluid Pack lot # [id number]  . COLONOSCOPY WITH PROPOFOL [name] 09/27/2015   Procedure: COLONOSCOPY WITH PROPOFOL;  Surgeon: [name], MD;   Location: [facility];  Service: Endoscopy;  Laterality: [name];  . CORONARY ARTERY BYPASS GRAFT  04/2011   Severe multivessel ASCAD s/p Stemi w PTCA of OM2 then s/p CABG w LIMA to LAD,SVG to OM1 /RI and SVG to Distal RCA   . FINGER SURGERY     left finger for ganglion cyst  . KIDNEY STONE SURGERY      Family History  Problem Relation Age of Onset  . Alzheimer's disease Mother   . Cancer Mother   . CVA Father   . Arrhythmia Father   . Arrhythmia Sister     Social history:  reports that he has never smoked. He has never used smokeless tobacco. He reports current alcohol use. He reports that he does not use drugs.  Medications:  Prior to Admission medications   Medication Sig Start Date End Date Taking? Authorizing Provider  Coenzyme Q10 (COQ-10 PO) Take 300 mg daily by mouth.   Yes [provider]  doxepin (SINEQUAN) 25 MG capsule Take 25 mg at bedtime as needed by mouth (for sleep.).    Yes [provider]  fish oil-omega-3 fatty acids 1000 MG capsule Take 1,000 mg daily by mouth.    Yes [provider]  Homeopathic Products (EARACHE DROPS OT) Place 1-2 drops 3 (three) times daily as needed in ear(s) (for ear pain.).   Yes [provider]  metoprolol tartrate (LOPRESSOR) 50 MG tablet TAKE 1 TABLET BY MOUTH TWICE A DAY 09/16/17  Yes Turner, Traci R, MD  mometasone (NASONEX) 50 MCG/ACT nasal spray Place 2 sprays daily as needed into the nose. For allergies. 08/14/16  Yes [provider]  pravastatin (PRAVACHOL) 40 MG tablet Take 1.5 tablets (60 mg total) by mouth daily. 09/26/17  Yes Turner, Eber Hong, MD  triamcinolone ointment (KENALOG) 0.1 % Apply 1 application 2 (two) times daily as needed topically. For redness on chest. 10/26/16  Yes [provider]  vitamin B-12 (CYANOCOBALAMIN) 100 MCG tablet Take 100 mcg at bedtime by mouth.    Yes [provider]  XARELTO 20 MG TABS tablet TAKE 1 TABLET (20 MG TOTAL) BY MOUTH DAILY WITH  SUPPER. 11/25/17  Yes Turner, Traci R, MD  zolpidem (AMBIEN) 5 MG tablet Take 2.5-5 mg at bedtime as needed by mouth. For sleep. 10/19/16  Yes [provider]      Allergies  Allergen Reactions  . Celebrex [Celecoxib]     Upset stomach    ROS:  Out of a complete 14 system review of symptoms, the patient complains only of the following symptoms, and all other reviewed systems are negative.  Transient speech disturbance  Blood pressure (!) 144/99, pulse 80, height 5\' 9"  (1.753 m), weight 186 lb (84.4 kg).  Physical Exam  General: The patient is alert and cooperative at the time of the examination.  Eyes: Pupils are equal, round, and reactive to light. Discs are flat bilaterally.  Neck: The neck is supple, no carotid bruits are noted.  Respiratory: The respiratory examination is clear.  Cardiovascular: The cardiovascular examination reveals an occasionally irregular heart rhythm, no obvious murmurs or rubs are noted.  Skin: Extremities are without significant edema.  Neurologic Exam  Mental status: The patient is alert and oriented x 3 at the time of the examination. The patient has apparent normal recent and remote memory, with an apparently normal attention span and concentration ability.  Cranial nerves: Facial symmetry is present. There is good sensation of the face to pinprick and soft touch bilaterally. The strength of the facial muscles and the muscles to head turning and shoulder shrug are normal bilaterally. Speech is well enunciated, no aphasia or dysarthria is noted. Extraocular movements are full. Visual fields are full. The tongue is midline, and the patient has symmetric elevation of the soft palate. No obvious hearing deficits are noted.  Motor: The motor testing reveals 5 over 5 strength of all 4 extremities. Good symmetric motor tone is noted throughout.  Sensory: Sensory testing is intact to pinprick, soft touch, vibration sensation, and position sense  on all 4 extremities. No evidence of extinction is noted.  Coordination: Cerebellar testing reveals good finger-nose-finger and heel-to-shin bilaterally.  Gait and station: Gait is normal. Tandem gait is normal. Romberg is negative. No drift is seen.  Reflexes: Deep tendon reflexes are symmetric and normal bilaterally. Toes are downgoing bilaterally.   Assessment/Plan:  1.  TIA event  2.  Atrial fibrillation on anticoagulation  3.  Coronary artery disease  4.  Hypertension  The patient has multiple risk factors for stroke, both embolic and thrombotic.  The patient will be sent for MRI of the brain, he will have a carotid Doppler study.  The TIA event appears to be referable to the Broca's area in the left frontal area.  The patient will remain on Xarelto for now.  I will contact him with the results of the above.  Jill Alexanders MD 02/24/2018 7:41 AM  [facility] [address] [place], [place] [address]  Phone [phone] Fax [phone]

## 2018-02-24 NOTE — Telephone Encounter (Signed)
Medicare/BCBS fed no auth ref # [id number] spoke to [place] with [place] fed.  Patient is scheduled at [facility] for Friday 02/28/18 arrival time is 2:45 pm,. Patient is aware of time and day.i also gave him their number of [phone] incase he needed to r/s.

## 2018-02-28 ENCOUNTER — Ambulatory Visit (HOSPITAL_COMMUNITY)
Admission: RE | Admit: 2018-02-28 | Discharge: 2018-02-28 | Disposition: A | Discharge status: Home / Self Care | Payer: Medicare Other | Source: Ambulatory Visit | Attending: Neurology | Admitting: Neurology

## 2018-02-28 DIAGNOSIS — G459 Transient cerebral ischemic attack, unspecified: Secondary | ICD-10-CM | POA: Diagnosis not present

## 2018-02-28 NOTE — Progress Notes (Signed)
Bilateral carotid duplex completed. Preliminary results in Chart review CV Proc. [place] Conlin Brahm,RVS 02/28/2018, 3:28 PM

## 2018-03-03 ENCOUNTER — Telehealth: Payer: Self-pay | Admitting: Neurology

## 2018-03-03 NOTE — Telephone Encounter (Signed)
I called the patient.  The carotid Doppler study done was unremarkable, MRI of the brain is pending, I will contact the patient when I get the results.   Carotid doppler 02/28/18:  Summary: Right Carotid: There is no evidence of stenosis in the right ICA.  Left Carotid: There is no evidence of stenosis in the left ICA.  Vertebrals:  Bilateral vertebral arteries demonstrate antegrade flow. Subclavians: Normal flow hemodynamics were seen in bilateral subclavian              arteries.

## 2018-03-09 ENCOUNTER — Ambulatory Visit
Admission: RE | Admit: 2018-03-09 | Discharge: 2018-03-09 | Disposition: A | Discharge status: Home / Self Care | Payer: Medicare Other | Source: Ambulatory Visit | Attending: Neurology | Admitting: Neurology

## 2018-03-09 DIAGNOSIS — G459 Transient cerebral ischemic attack, unspecified: Secondary | ICD-10-CM | POA: Diagnosis not present

## 2018-03-10 ENCOUNTER — Telehealth: Payer: Self-pay | Admitting: Neurology

## 2018-03-10 NOTE — Telephone Encounter (Signed)
  I called the patient.  MRI of the brain is unremarkable, no evidence of a stroke in the Broca's area that would have explained his symptoms of transient aphasia.  The carotid Doppler study was unremarkable, he will continue on the Xarelto.  MRI brain 03/10/18:  IMPRESSION: This MRI of the brain without contrast shows the following: 1.    There are no acute findings. 2.    T2/flair hyperintense foci in the hemispheres consistent with mild to moderate chronic microvascular ischemic change.  None of the foci appears to be acute. 3.    Two chronic microhemorrhages, one in the left thalamus and one in the left putamen.  These could be incidental or due to hypertensive changes. 4.    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  Flow voids are noted within all of the major intracerebral arteries.

## 2018-03-10 NOTE — Telephone Encounter (Signed)
I received a call from the pt in regards to this result. Pt states he received a message from Dr. Jannifer Franklin but the phone call was cut short. I was able to review result from Dr. Jannifer Franklin that states "MRI of the brain is unremarkable, no evidence of a stroke in the Broca's area that would have explained his symptoms of transient aphasia.  The carotid Doppler study was unremarkable, he will continue on the Xarelto.".  Pt was verbalized understanding of results and was agreeable to continuing Xarelto.

## 2018-03-20 ENCOUNTER — Other Ambulatory Visit: Payer: Medicare Other

## 2018-03-24 ENCOUNTER — Telehealth: Payer: Self-pay | Admitting: Neurology

## 2018-03-24 NOTE — Telephone Encounter (Signed)
     Carotid doppler study 03/24/18:  Summary: Right Carotid: There is no evidence of stenosis in the right ICA.  Left Carotid: There is no evidence of stenosis in the left ICA.  Vertebrals:  Bilateral vertebral arteries demonstrate antegrade flow. Subclavians: Normal flow hemodynamics were seen in bilateral subclavian              arteries.

## 2018-03-24 NOTE — Telephone Encounter (Signed)
I received the report again of the carotid Doppler study, contacted patient concerning this, he was already informed of the normal results.

## 2018-04-02 ENCOUNTER — Other Ambulatory Visit: Payer: Self-pay | Admitting: Surgery

## 2018-04-02 DIAGNOSIS — I712 Thoracic aortic aneurysm, without rupture, unspecified: Secondary | ICD-10-CM

## 2018-05-14 ENCOUNTER — Other Ambulatory Visit: Payer: Self-pay | Admitting: Cardiology

## 2018-05-14 NOTE — Telephone Encounter (Signed)
Pt last saw Ermalinda Barrios, PA 08/14/17, last labs 09/17/17 Creat 1.28, age 73, weight 84.4kg, CrCl 62.27, based on CrCl pt is on appropriate dosage of Xarelto 20mg  QD.  Will refill rx.

## 2018-06-17 ENCOUNTER — Other Ambulatory Visit: Payer: Self-pay

## 2018-06-18 ENCOUNTER — Other Ambulatory Visit: Payer: Self-pay

## 2018-06-18 ENCOUNTER — Encounter: Payer: Self-pay | Admitting: Surgery

## 2018-06-18 ENCOUNTER — Ambulatory Visit
Admission: RE | Admit: 2018-06-18 | Discharge: 2018-06-18 | Disposition: A | Discharge status: Home / Self Care | Payer: Medicare Other | Source: Ambulatory Visit | Attending: Surgery | Admitting: Surgery

## 2018-06-18 ENCOUNTER — Ambulatory Visit (INDEPENDENT_AMBULATORY_CARE_PROVIDER_SITE_OTHER): Payer: Medicare Other | Admitting: Surgery

## 2018-06-18 VITALS — BP 142/92 | HR 75 | Temp 97.5°F | Resp 16 | Ht 69.0 in | Wt 184.0 lb

## 2018-06-18 DIAGNOSIS — I712 Thoracic aortic aneurysm, without rupture, unspecified: Secondary | ICD-10-CM

## 2018-06-18 IMAGING — CT CT ANGIOGRAPHY CHEST
2 of 6 series · 13 of 36 positions shown · IV contrast (iopamidol)
Comparison: [DATE] and prior studies.

CLINICAL DATA: Follow-up aneurysmal disease of the ascending
thoracic aorta.

EXAM:
CT ANGIOGRAPHY CHEST WITH CONTRAST
TECHNIQUE: Multidetector CT imaging of the chest was performed using the
standard protocol during bolus administration of intravenous
contrast. Multiplanar CT image reconstructions and MIPs were
obtained to evaluate the vascular anatomy.
CONTRAST:  75mL [N3] IOPAMIDOL ([N3]) INJECTION 61%
Creatinine was obtained on site at [HOSPITAL] at [REDACTED].
Results: Creatinine 1.2 mg/dL.  Estimated GFR 60 mL/minute.

[Series 4: cta thorax 2.00 bv36 s3 ax axial arterial · axial · arterial · 0.75mm/px · z∈[+1418,+1704]mm · 12 of 171 slices shown]
[im 14/171  lung]
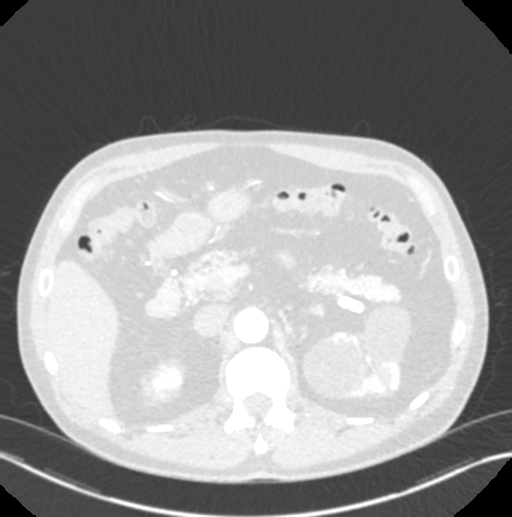
[im 27/171  mediastinal]
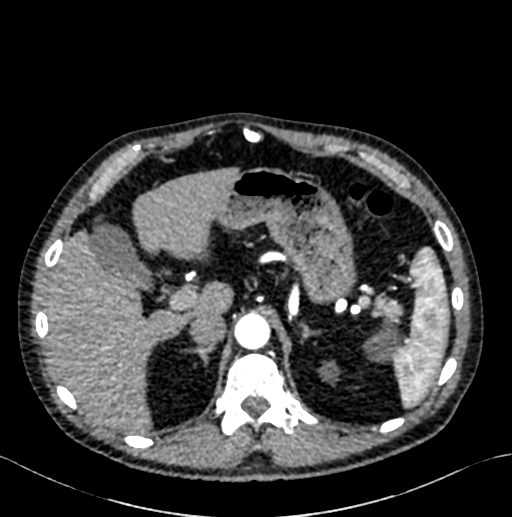
[im 40/171  lung]
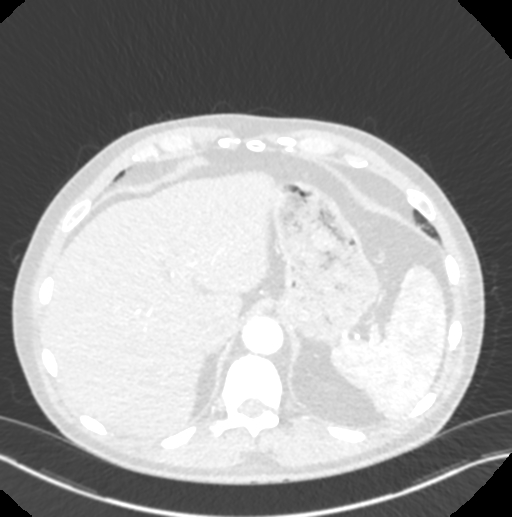
[im 53/171  mediastinal]
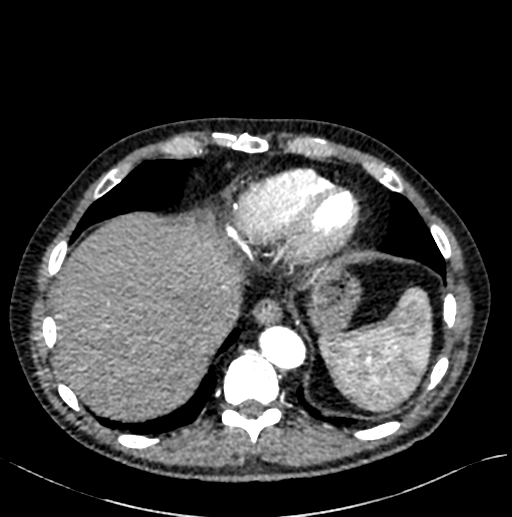
[im 66/171  lung]
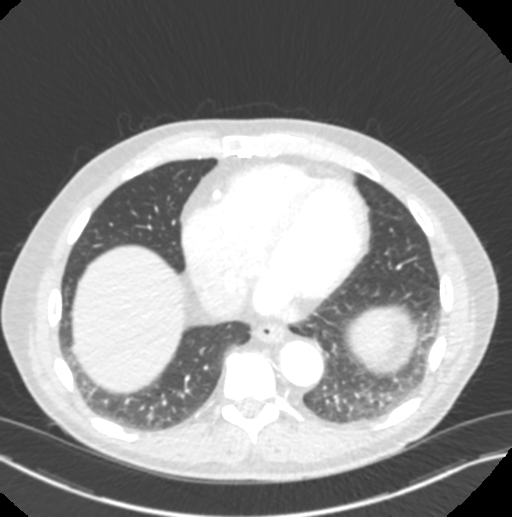
[im 79/171  mediastinal]
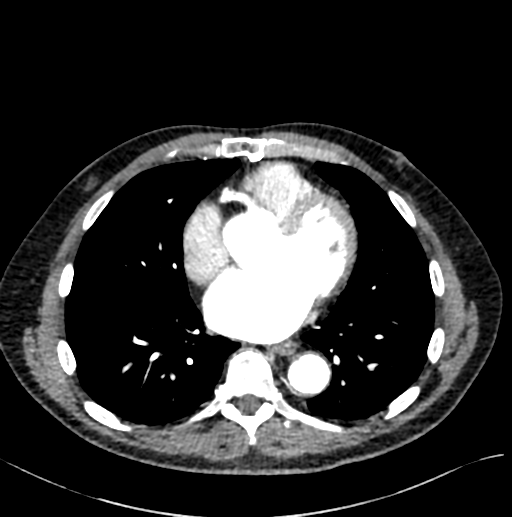
[im 92/171  lung]
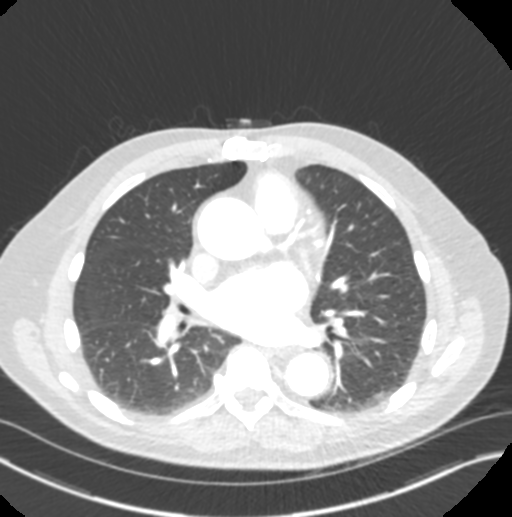
[im 105/171  mediastinal]
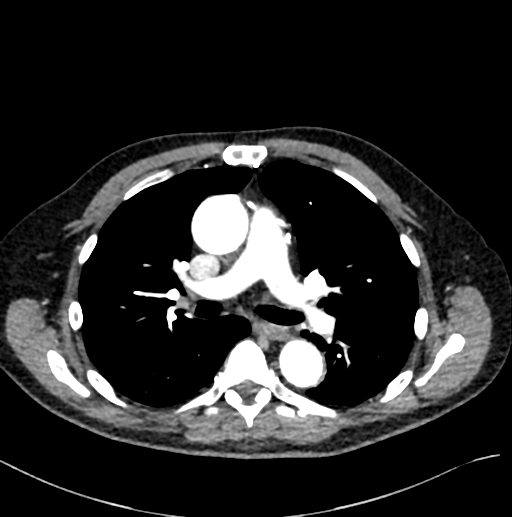
[im 118/171  lung]
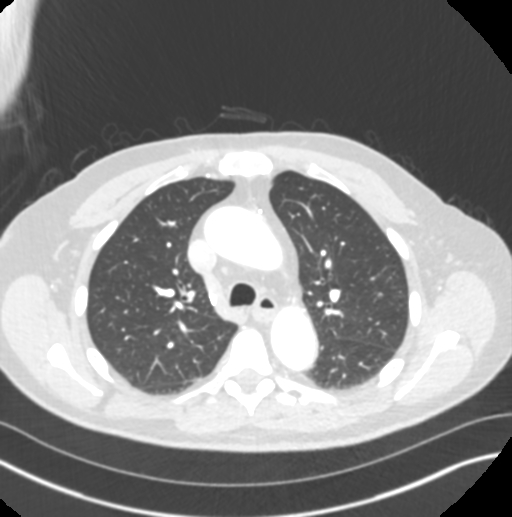
[im 131/171  mediastinal]
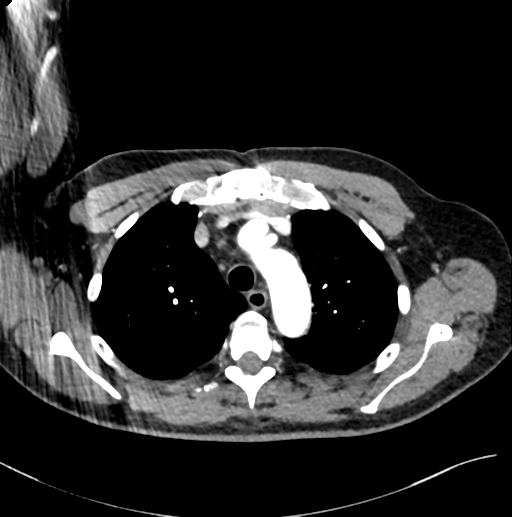
[im 144/171  lung]
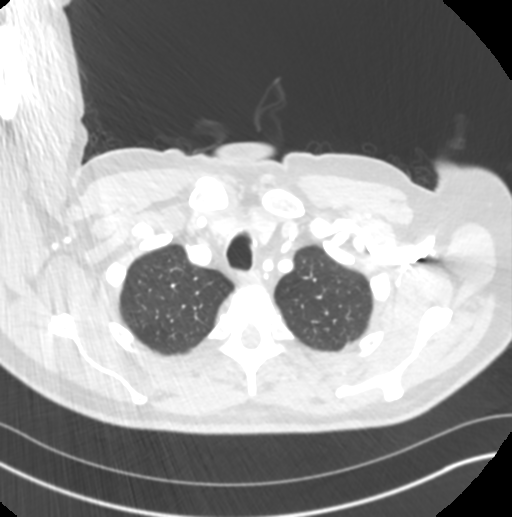
[im 157/171  mediastinal]
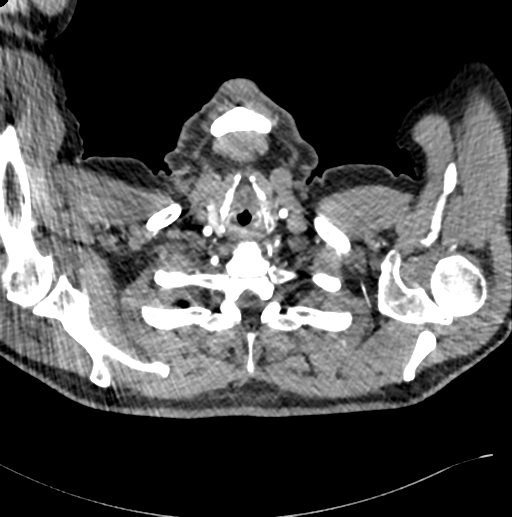

[Series 9: cta thorax 2.00 bv36 s3 cor cor st · coronal · 0.66mm/px · 1 of 162 slices shown]
[im 81/162  mediastinal]
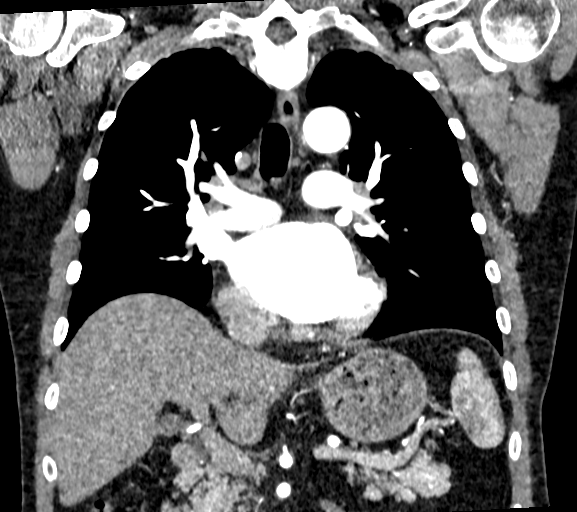

[13 of 36 positions shown; findings below may reference images not displayed]

FINDINGS: Cardiovascular: Stable dilated aortic root measuring 4.3-4.4 cm at
the sinuses of Valsalva. The ascending thoracic aorta measures
4.5-4.6 cm in greatest diameter. The proximal arch measures 3.6 cm
and the distal arch 3.2 cm. The descending thoracic aorta measures
2.9-3.0 cm. There is no evidence of aortic dissection. Proximal
great vessels show normal patency and continued evidence of normal
variant separate origin of the left vertebral artery off of the
aortic arch.

Stable heart size. Stable evidence of prior CABG. No evidence of
pericardial fluid. Central pulmonary arteries are normal in caliber.

Mediastinum/Nodes: No enlarged mediastinal, hilar, or axillary lymph
nodes. Thyroid gland, trachea, and esophagus demonstrate no
significant findings.

Lungs/Pleura: There is no evidence of pulmonary edema,
consolidation, pneumothorax, nodule or pleural fluid.

Upper Abdomen: Stable left renal cysts.

Musculoskeletal: No chest wall abnormality. No acute or significant
osseous findings.

Review of the MIP images confirms the above findings.
IMPRESSION: Stable aneurysmal disease of the aortic root measuring 4.3-4.4 cm
and ascending thoracic aorta measuring 4.5-4.6 cm.

Aortic aneurysm NOS ([N3]-[N3]).

## 2018-06-18 MED ORDER — IOPAMIDOL (ISOVUE-300) INJECTION 61%
75.0000 mL | Freq: Once | INTRAVENOUS | Status: AC | PRN
  Administered 2018-06-18: 75 mL via INTRAVENOUS

## 2018-06-18 NOTE — Progress Notes (Signed)
HPI:  The patient is a 73 year old gentleman with a history of a 4.4 cm fusiform ascending aortic aneurysm found incidentally on an echo and CT scan in April 2015.  I have been following this since with yearly CT scans and I last saw him on 06/26/2017 at which time CTA of the chest showed it to be measured at 4.4 cm and stable.  He continues to feel well without any chest pain or shortness of breath.  Current Outpatient Medications  Medication Sig Dispense Refill  . Coenzyme Q10 (COQ-10 PO) Take 300 mg daily by mouth.    . doxepin (SINEQUAN) 25 MG capsule Take 25 mg at bedtime as needed by mouth (for sleep.).     [name] fish oil-omega-3 fatty acids 1000 MG capsule Take 1,000 mg daily by mouth.     . Homeopathic Products (EARACHE DROPS OT) Place 1-2 drops 3 (three) times daily as needed in ear(s) (for ear pain.).    [name] metoprolol tartrate (LOPRESSOR) 50 MG tablet TAKE 1 TABLET BY MOUTH TWICE A DAY 180 tablet 3  . mometasone (NASONEX) 50 MCG/ACT nasal spray Place 2 sprays daily as needed into the nose. For allergies.  12  . pravastatin (PRAVACHOL) 40 MG tablet Take 1.5 tablets (60 mg total) by mouth daily. 135 tablet 3  . triamcinolone ointment (KENALOG) 0.1 % Apply 1 application 2 (two) times daily as needed topically. For redness on chest.  1  . vitamin B-12 (CYANOCOBALAMIN) 100 MCG tablet Take 100 mcg at bedtime by mouth.     Alveda Reasons 20 MG TABS tablet TAKE 1 TABLET (20 MG TOTAL) BY MOUTH DAILY WITH SUPPER. 30 tablet 5  . zolpidem (AMBIEN) 5 MG tablet Take 2.5-5 mg at bedtime as needed by mouth. For sleep.  1   No current facility-administered medications for this visit.      Physical Exam: BP (!) 142/92 (BP Location: Left Arm, Patient Position: Sitting, Cuff Size: Normal)   Pulse 75   Temp (!) 97.5 F (36.4 C) (Other (Comment)) Comment (Src): THERMAL  Resp 16   Ht 5\' 9"  (1.753 m)   Wt 184 lb (83.5 kg)   SpO2 97% Comment: RA  BMI 27.17 kg/m  He looks well. Cardiac exam shows a  regular rate and rhythm with normal heart sounds.  There is no murmur. Lungs are clear.  Diagnostic Tests:  CLINICAL DATA:  Follow-up aneurysmal disease of the ascending thoracic aorta.  EXAM: CT ANGIOGRAPHY CHEST WITH CONTRAST  TECHNIQUE: Multidetector CT imaging of the chest was performed using the standard protocol during bolus administration of intravenous contrast. Multiplanar CT image reconstructions and MIPs were obtained to evaluate the vascular anatomy.  CONTRAST:  77mL ISOVUE-300 IOPAMIDOL (ISOVUE-300) INJECTION 61%  Creatinine was obtained on site at [place] at [address]  Results: Creatinine 1.2 mg/dL.  Estimated GFR 60 mL/minute.  COMPARISON:  06/26/2017 and prior studies.  FINDINGS: Cardiovascular: Stable dilated aortic root measuring 4.3-4.4 cm at the sinuses of Valsalva. The ascending thoracic aorta measures 4.5-4.6 cm in greatest diameter. The proximal arch measures 3.6 cm and the distal arch 3.2 cm. The descending thoracic aorta measures 2.9-3.0 cm. There is no evidence of aortic dissection. Proximal great vessels show normal patency and continued evidence of normal variant separate origin of the left vertebral artery off of the aortic arch.  Stable heart size. Stable evidence of prior CABG. No evidence of pericardial fluid. Central pulmonary arteries are normal in caliber.  Mediastinum/Nodes: No  enlarged mediastinal, hilar, or axillary lymph nodes. Thyroid gland, trachea, and esophagus demonstrate no significant findings.  Lungs/Pleura: There is no evidence of pulmonary edema, consolidation, pneumothorax, nodule or pleural fluid.  Upper Abdomen: Stable left renal cysts.  Musculoskeletal: No chest wall abnormality. No acute or significant osseous findings.  Review of the MIP images confirms the above findings.  IMPRESSION: Stable aneurysmal disease of the aortic root measuring 4.3-4.4 cm and ascending thoracic  aorta measuring 4.5-4.6 cm.  Aortic aneurysm NOS (ICD10-I71.9).   Electronically Signed   By: Aletta Edouard M.D.   On: 06/18/2018 12:40   Impression:  Mr. Harriman has a stable aortic root and ascending aortic aneurysm with a diameter at the aortic root measured 4.3 to 4.4 cm and a diameter of 4.5 to 4.6 cm at the mid ascending aorta.  This is not significantly different compared to his CT scan in 2015 and subsequent yearly CT scans since then.  This is still well below the surgical threshold of 5.5 cm.  I reviewed the CTA images with him and answered his questions.  I stressed the importance of good blood pressure control.  His pressure is up a little bit today but he does check his pressure at home and is usually in the 120s.  I cautioned him against doing any heavy lifting of more than 35 pounds or doing any activities that may result in a Valsalva maneuver such as sit ups, pull ups, and push-ups.  Also cautioned him against taking quinolone antibiotics which may be associated with aortic aneurysm and aortic dissection.  I recommended that he have a follow-up scan in 1 year.  Plan:  I will plan to see him back in 1 year with a CTA of the chest.  I spent 15 minutes performing this established patient evaluation and > 50% of this time was spent face to face counseling and coordinating the care of this patient's aortic aneurysm.    Gaye Pollack, MD [facility] [phone]

## 2018-08-20 ENCOUNTER — Other Ambulatory Visit: Payer: Self-pay | Admitting: Cardiology

## 2018-09-10 DIAGNOSIS — I2510 Atherosclerotic heart disease of native coronary artery without angina pectoris: Secondary | ICD-10-CM | POA: Diagnosis not present

## 2018-09-10 DIAGNOSIS — Z7901 Long term (current) use of anticoagulants: Secondary | ICD-10-CM | POA: Diagnosis not present

## 2018-09-10 DIAGNOSIS — S60221A Contusion of right hand, initial encounter: Secondary | ICD-10-CM | POA: Diagnosis not present

## 2018-09-10 DIAGNOSIS — Z8673 Personal history of transient ischemic attack (TIA), and cerebral infarction without residual deficits: Secondary | ICD-10-CM | POA: Diagnosis not present

## 2018-09-10 DIAGNOSIS — I4891 Unspecified atrial fibrillation: Secondary | ICD-10-CM | POA: Diagnosis not present

## 2018-09-10 DIAGNOSIS — E785 Hyperlipidemia, unspecified: Secondary | ICD-10-CM | POA: Diagnosis not present

## 2018-09-11 DIAGNOSIS — S60221A Contusion of right hand, initial encounter: Secondary | ICD-10-CM | POA: Diagnosis not present

## 2018-09-11 DIAGNOSIS — Z7901 Long term (current) use of anticoagulants: Secondary | ICD-10-CM | POA: Diagnosis not present

## 2018-09-11 DIAGNOSIS — R200 Anesthesia of skin: Secondary | ICD-10-CM | POA: Diagnosis not present

## 2018-09-11 DIAGNOSIS — I252 Old myocardial infarction: Secondary | ICD-10-CM | POA: Diagnosis not present

## 2018-09-11 DIAGNOSIS — R202 Paresthesia of skin: Secondary | ICD-10-CM | POA: Diagnosis not present

## 2018-09-11 DIAGNOSIS — M79641 Pain in right hand: Secondary | ICD-10-CM | POA: Diagnosis not present

## 2018-09-11 DIAGNOSIS — M7989 Other specified soft tissue disorders: Secondary | ICD-10-CM | POA: Diagnosis not present

## 2018-09-12 ENCOUNTER — Telehealth: Payer: Self-pay | Admitting: Physician Assistant

## 2018-09-12 DIAGNOSIS — S60221A Contusion of right hand, initial encounter: Secondary | ICD-10-CM | POA: Diagnosis not present

## 2018-09-12 DIAGNOSIS — I10 Essential (primary) hypertension: Secondary | ICD-10-CM | POA: Diagnosis not present

## 2018-09-12 DIAGNOSIS — I4819 Other persistent atrial fibrillation: Secondary | ICD-10-CM | POA: Diagnosis not present

## 2018-09-12 DIAGNOSIS — Z7901 Long term (current) use of anticoagulants: Secondary | ICD-10-CM | POA: Diagnosis not present

## 2018-09-12 DIAGNOSIS — G4733 Obstructive sleep apnea (adult) (pediatric): Secondary | ICD-10-CM | POA: Diagnosis not present

## 2018-09-12 DIAGNOSIS — E782 Mixed hyperlipidemia: Secondary | ICD-10-CM | POA: Diagnosis not present

## 2018-09-12 DIAGNOSIS — Z79899 Other long term (current) drug therapy: Secondary | ICD-10-CM | POA: Diagnosis not present

## 2018-09-12 DIAGNOSIS — I252 Old myocardial infarction: Secondary | ICD-10-CM | POA: Diagnosis not present

## 2018-09-12 NOTE — Telephone Encounter (Signed)
Sean Willis called because he had banged the back of his hand on something the other day.  He is on Xarelto.  At first it was okay, but about a day later he got a significant hematoma in the area.  He had been putting heat on it to make it feel better.  He went to the emergency room 2 days in a row to get this drained.  He wants to know if he can stop the Xarelto.  I explained that he needed to be on his blood thinner if at all possible.  I encouraged him to use ice and elevation and an Ace wrap to get the bleeding stopped.  I explained that although the blood under the skin could spread, he should start to see the color change around the edges to let them know that it is going away.  He stated he currently had a pressure dressing in place from the ER.  He agreed to use ice and elevation and will contact us if symptoms do not improve.  Rosaria Ferries, PA-C 09/12/2018 10:00 AM [facility] [phone]

## 2018-09-14 ENCOUNTER — Other Ambulatory Visit: Payer: Self-pay | Admitting: Cardiology

## 2018-09-15 DIAGNOSIS — T148XXA Other injury of unspecified body region, initial encounter: Secondary | ICD-10-CM | POA: Diagnosis not present

## 2018-09-15 DIAGNOSIS — M79641 Pain in right hand: Secondary | ICD-10-CM | POA: Diagnosis not present

## 2018-10-01 ENCOUNTER — Other Ambulatory Visit: Payer: Self-pay | Admitting: Cardiology

## 2018-10-16 ENCOUNTER — Other Ambulatory Visit: Payer: Self-pay | Admitting: Cardiology

## 2018-10-24 ENCOUNTER — Other Ambulatory Visit: Payer: Self-pay | Admitting: Cardiology

## 2018-11-01 ENCOUNTER — Other Ambulatory Visit: Payer: Self-pay | Admitting: Cardiology

## 2018-11-03 NOTE — Progress Notes (Signed)
Cardiology Office Note:    Date:  11/04/2018   ID:  SUHAIL PELOQUIN, DOB 08-10-1945, MRN [id number]  PCP:  [name], MD  Cardiologist:  Fransico Him, MD    Referring MD: [name], MD   Chief Complaint  Patient presents with  . Coronary Artery Disease  . Hypertension  . Atrial Fibrillation  . Sleep Apnea    History of Present Illness:    Sean Willis is a 73 y.o. male with a hx of CAD status post CABG 4 2013 in setting of MI in [place] with initial PTCA of the OM 2 and subsequent CABG with LIMA to the LAD, SVG to diagonal 1, SVG to OM1/R1 and SVG to distal RCA.He also has a history of hypertension, dyslipidemia. He has a history of persistent atrial fibrillation s/p DCCV 01/2016 and 03/2016 and is on Xarelto with a CHADS2VASC score of 3.He had recurrent afib and did not want to pursue AAD therapy due to potential sides effects and the fact that he is his wife's primary care giver.  He is now in permanent atrial fibrillation.  He also has a moderately dilated aortic root at 46 mm and moderate MR. He is followed by Dr. [name]. He has severe OSA with an AHI of 33.6/hr and O2 desats of 87%. He is on CPAP at 7cm H2O.   He is here today for followup and is doing well.  He denies any chest pain or pressure, , PND, orthopnea, LE edema, dizziness, palpitations or syncope. He has had some problems recently with indigestion sx in his upper abdomen and epigastric area that resolves with ASA.  This is nonexertional and there are no associated sx of N/diaphoresis or SOB.  He has noticed some DOE at times. He recently lost his wife due to stomach and ovarian CA and thinks he is having problems with depression. He is compliant with his meds and is tolerating meds with no SE.    Past Medical History:  Diagnosis Date  . Benign essential HTN 11/24/2013  . BPH (benign prostatic hyperplasia)   . CAD (coronary artery disease) 01/23/2013  . CAD (coronary artery  disease), native coronary artery 04/2011   S/P CABG X4 Sx done in [place]   . Carotid arterial disease ([place]) 11/24/2014  . Carotid artery stenosis    1-39% bilateral by dopplers 2018  . Chest wall pain    Right side, muscular,ortho,MRI. U/S abd ok. On lyrica  . Chronic insomnia   . Colon polyp 10/11  . Coronary artery disease 04/2011   s/p CABG in setting of MI in [place], [place], with initial PTCA of OM2 then CABG with LIMA to LAD, SVG to D1, SVG to OM1,/R1 and SVG to distal RCA  . DDD (degenerative disc disease), lumbar   . Dilatation of aorta (HCC)    4.5 cm ascending aortic aneurysm per chets ct 05-18-15 epic  . Dysrhythmia   . ED (erectile dysfunction)   . GERD (gastroesophageal reflux disease)   . Gout   . Gout   . History of kidney stones   . Hypertension    since mi in 2013 no htn  . Hypertriglyceridemia   . Kidney stone    uric acid, Dr [name]  renal insufficiency  . Mitral regurgitation 06/04/2016   Moderate by echo 2017  . Mixed hyperlipidemia 01/23/2013  . Myocardial infarction ([place])    2013  . OSA on CPAP 07/16/2016  . Osteoarthritis    of the  knee  . Persistent atrial fibrillation ([place])    s/p DCCV 01/2016.  CHASDS2VASC score is 3  . Sleep apnea     Past Surgical History:  Procedure Laterality Date  . CARDIOVERSION [name] 01/20/2016   Procedure: CARDIOVERSION;  Surgeon: Thayer Headings, MD;  Location: [facility];  Service: Cardiovascular;  Laterality: [name];  . CARDIOVERSION [name] 03/23/2016   Procedure: CARDIOVERSION;  Surgeon: Sueanne Margarita, MD;  [facility];  Service: Cardiovascular;  Laterality: [name];  . CARDIOVERSION    . CATARACT EXTRACTION W/PHACO Right 10/26/2014   Procedure: CATARACT EXTRACTION PHACO AND INTRAOCULAR LENS PLACEMENT (IOC);  Surgeon: [name], MD;  Location: [facility];  Service: Ophthalmology;  Laterality: Right;  LOT PACK: [id number] US:01:43.0 AP:27.8 CDE:28.68  . CATARACT EXTRACTION W/PHACO Left 11/13/2016   Procedure:  CATARACT EXTRACTION PHACO AND INTRAOCULAR LENS PLACEMENT (IOC);  Surgeon: [name], MD;  Location: [facility];  Service: Ophthalmology;  Laterality: Left;  [place] 01:00.2 AP% 15.7 CDE 9.47 Fluid Pack lot # [id number]  . COLONOSCOPY WITH PROPOFOL [name] 09/27/2015   Procedure: COLONOSCOPY WITH PROPOFOL;  Surgeon: [name], MD;  [facility];  Service: Endoscopy;  Laterality: [name];  . CORONARY ARTERY BYPASS GRAFT  04/2011   Severe multivessel ASCAD s/p Stemi w PTCA of OM2 then s/p CABG w LIMA to LAD,SVG to OM1 /RI and SVG to Distal RCA   . FINGER SURGERY     left finger for ganglion cyst  . KIDNEY STONE SURGERY      Current Medications: Current Meds  Medication Sig  . Coenzyme Q10 (COQ-10 PO) Take 300 mg daily by mouth.  . doxepin (SINEQUAN) 25 MG capsule Take 25 mg at bedtime as needed by mouth (for sleep.).   [name] Homeopathic Products (EARACHE DROPS OT) Place 1-2 drops 3 (three) times daily as needed in ear(s) (for ear pain.).  [name] metoprolol tartrate (LOPRESSOR) 50 MG tablet TAKE 1 TABLET BY MOUTH 2 TIMES DAILY. MAKE OVERDUE APPT WITH DR BEFORE ANYMORE REFILLS. 2ND ATTEMPT  . mometasone (NASONEX) 50 MCG/ACT nasal spray Place 2 sprays daily as needed into the nose. For allergies.  . pravastatin (PRAVACHOL) 40 MG tablet TAKE 1.5 TABLETS (60 MG TOTAL) BY MOUTH DAILY.  [name] triamcinolone ointment (KENALOG) 0.1 % Apply 1 application 2 (two) times daily as needed topically. For redness on chest.  . vitamin B-12 (CYANOCOBALAMIN) 100 MCG tablet Take 100 mcg at bedtime by mouth.   Alveda Reasons 20 MG TABS tablet TAKE 1 TABLET (20 MG TOTAL) BY MOUTH DAILY WITH SUPPER.  [name] zolpidem (AMBIEN) 5 MG tablet Take 2.5-5 mg at bedtime as needed by mouth. For sleep.     Allergies:   Celebrex [celecoxib]   Social History   Socioeconomic History  . Marital status: Married    Spouse name: Not on file  . Number of children: Not on file  . Years of education: Not on file  . Highest education level: Not  on file  Occupational History  . Not on file  Social Needs  . Financial resource strain: Not on file  . Food insecurity    Worry: Not on file    Inability: Not on file  . Transportation needs    Medical: Not on file    Non-medical: Not on file  Tobacco Use  . Smoking status: Never Smoker  . Smokeless tobacco: Never Used  Substance and Sexual Activity  . Alcohol use: Yes    Comment: yes occassionally  . Drug use: No  .  Sexual activity: Not on file  Lifestyle  . Physical activity    Days per week: Not on file    Minutes per session: Not on file  . Stress: Not on file  Relationships  . Social Herbalist on phone: Not on file    Gets together: Not on file    Attends religious service: Not on file    Active member of club or organization: Not on file    Attends meetings of clubs or organizations: Not on file    Relationship status: Not on file  Other Topics Concern  . Not on file  Social History Narrative  . Not on file     Family History: The patient's family history includes Alzheimer's disease in his mother; Arrhythmia in his father and sister; CVA in his father; Cancer in his mother.  ROS:   Please see the history of present illness.    ROS  All other systems reviewed and negative.   EKGs/Labs/Other Studies Reviewed:    The following studies were reviewed today: none  EKG:  EKG is  ordered today.  The ekg ordered today demonstrates atrial fibrillation with CVR  Recent Labs: 12/03/2017: ALT 25   Recent Lipid Panel    Component Value Date/Time   CHOL 124 12/03/2017 0928   CHOL 123 01/14/2013 0923   TRIG 151 (H) 12/03/2017 0928   TRIG 141 01/14/2013 0923   HDL 31 (L) 12/03/2017 0928   HDL 34 (L) 01/14/2013 0923   CHOLHDL 4.0 12/03/2017 0928   CHOLHDL 5.8 (H) 12/20/2015 0914   VLDL 38 (H) 12/20/2015 0914   LDLCALC 63 12/03/2017 0928   LDLCALC 61 01/14/2013 0923    Physical Exam:    VS:  BP (!) 146/88   Pulse 79   Ht 5' 8.5" (1.74 m)    Wt 186 lb (84.4 kg)   BMI 27.87 kg/m     Wt Readings from Last 3 Encounters:  11/04/18 186 lb (84.4 kg)  06/18/18 184 lb (83.5 kg)  02/24/18 186 lb (84.4 kg)     GEN:  Well nourished, well developed in no acute distress HEENT: Normal NECK: No JVD; No carotid bruits LYMPHATICS: No lymphadenopathy CARDIAC: RRR, no murmurs, rubs, gallops RESPIRATORY:  Clear to auscultation without rales, wheezing or rhonchi  ABDOMEN: Soft, non-tender, non-distended MUSCULOSKELETAL:  No edema; No deformity  SKIN: Warm and dry NEUROLOGIC:  Alert and oriented x 3 PSYCHIATRIC:  Normal affect   ASSESSMENT:    1. Coronary artery disease involving native coronary artery of native heart without angina pectoris   2. Benign essential HTN   3. Bilateral carotid artery stenosis   4. Permanent atrial fibrillation ([place])   5. Dilatation of aorta (HCC)   6. OSA (obstructive sleep apnea)   7. Mixed hyperlipidemia    PLAN:    In order of problems listed above:  1.  ASCAD -s/p CABG 4 2013 in setting of MI in [place] with initial PTCA of the OM 2 and subsequent CABG with LIMA to the LAD, SVG to diagonal 1, SVG to OM1/R1 and SVG to distal RCA. -denies any anginal CP but is having some indigestion sx in his upper abdomen and epigastrium that resolves with ASA -I will get a Lexiscan myoview to rule out ischemia -continue statin and BB -no ASA due to DOAC  2.  HTN -BP is borderline controlled -continue Lopressor 17m BID -add Losartan 276mdaily and check BMET in 1 week -check  BP daily for a week and call with results  3.  Bilateral carotid artery disease -dopplers 02/2018 showed no evidence of carotid disease  4.  Permanent atrial fibrillation -HR controlled -continue Lopressor 11m BID and Xarelto 232mdaily -creatinine stable at 1.07 in August -check Hbg  5.  Moderately dilated aortic root -aortic root 4114mnd ascending aortic dimension 84m10m echo 2018 -chest CTA 06/2018 showed  stable 4.3-4.4cm aortic root and 4.5-4.6 ascending thoracic aorta -followed by Dr. BartCyndia Bentntinue statin  6.  OSA  -he stopped using CPAP a few years ago because he did not tolerate it.    7.  HLD -LDL goal < 70 -continue pravastatin 60mg56mly -check FLp and ALT  8.  Depression -he has been feeling down and problems sleeping since his wife died recently -I encouraged him to call his PCP to talk about getting on an antidepressant    Medication Adjustments/Labs and Tests Ordered: Current medicines are reviewed at length with the patient today.  Concerns regarding medicines are outlined above.  Orders Placed This Encounter  Procedures  . Lipid Profile  . Comp Met (CMET)  . CBC  . Myocardial Perfusion Imaging  . EKG 12-Lead   Meds ordered this encounter  Medications  . losartan (COZAAR) 25 MG tablet    Sig: Take 1 tablet (25 mg total) by mouth daily.    Dispense:  90 tablet    Refill:  1    Signed, TraciFransico Him 11/04/2018 4:45 PM    [facility]

## 2018-11-03 NOTE — H&P (View-Only) (Signed)
Cardiology Office Note:    Date:  11/04/2018   ID:  Sean Willis, DOB 07/26/1945, MRN [id number]  PCP:  Husain, Karrar, MD  Cardiologist:  Traci Turner, MD    Referring MD: Husain, Karrar, MD   Chief Complaint  Patient presents with  . Coronary Artery Disease  . Hypertension  . Atrial Fibrillation  . Sleep Apnea    History of Present Illness:    Sean Willis is a 73 y.o. male with a hx of CAD status post CABG 4 2013 in setting of MI in [place]  with initial PTCA of the OM 2 and subsequent CABG with LIMA to the LAD, SVG to diagonal 1, SVG to OM1/R1 and SVG to distal RCA.He also has a history of hypertension, dyslipidemia. He has a history of persistent atrial fibrillation s/p DCCV 01/2016 and 03/2016 and is on Xarelto with a CHADS2VASC score of 3.He had recurrent afib and did not want to pursue AAD therapy due to potential sides effects and the fact that he is his wife's primary care giver.  He is now in permanent atrial fibrillation.  He also has a moderately dilated aortic root at 46 mm and moderate MR. He is followed by Dr. Bartle. He has severe OSA with an AHI of 33.6/hr and O2 desats of 87%. He is on CPAP at 7cm H2O.   He is here today for followup and is doing well.  He denies any chest pain or pressure, , PND, orthopnea, LE edema, dizziness, palpitations or syncope. He has had some problems recently with indigestion sx in his upper abdomen and epigastric area that resolves with ASA.  This is nonexertional and there are no associated sx of N/diaphoresis or SOB.  He has noticed some DOE at times. He recently lost his wife due to stomach and ovarian CA and thinks he is having problems with depression. He is compliant with his meds and is tolerating meds with no SE.    Past Medical History:  Diagnosis Date  . Benign essential HTN 11/24/2013  . BPH (benign prostatic hyperplasia)   . CAD (coronary artery disease) 01/23/2013  . CAD (coronary artery  disease), native coronary artery 04/2011   S/P CABG X4 Sx [place] in [place]  . Carotid arterial disease (HCC) 11/24/2014  . Carotid artery stenosis    1-39% bilateral by dopplers 2018  . Chest wall pain    Right side, muscular,ortho,MRI. U/S abd ok. On lyrica  . Chronic insomnia   . Colon polyp 10/11  . Coronary artery disease 04/2011   s/p CABG in setting of MI in [place], [place], with initial PTCA of OM2 then CABG with LIMA to LAD, SVG to D1, SVG to OM1,/R1 and SVG to distal RCA  . DDD (degenerative disc disease), lumbar   . Dilatation of aorta (HCC)    4.5 cm ascending aortic aneurysm per chets ct 05-18-15 epic  . Dysrhythmia   . ED (erectile dysfunction)   . GERD (gastroesophageal reflux disease)   . Gout   . Gout   . History of kidney stones   . Hypertension    since mi in 2013 no htn  . Hypertriglyceridemia   . Kidney stone    uric acid, Dr Grapey  renal insufficiency  . Mitral regurgitation 06/04/2016   Moderate by echo 2017  . Mixed hyperlipidemia 01/23/2013  . Myocardial infarction (HCC)    2013  . OSA on CPAP 07/16/2016  . Osteoarthritis    of the   knee  . Persistent atrial fibrillation (HCC)    s/p DCCV 01/2016.  CHASDS2VASC score is 3  . Sleep apnea     Past Surgical History:  Procedure Laterality Date  . CARDIOVERSION [name] 01/20/2016   Procedure: CARDIOVERSION;  Surgeon: Philip J Nahser, MD;  [facility];  Service: Cardiovascular;  Laterality: [name];  . CARDIOVERSION [name] 03/23/2016   Procedure: CARDIOVERSION;  Surgeon: Traci R Turner, MD;  [facility];  Service: Cardiovascular;  Laterality: [name];  . CARDIOVERSION    . CATARACT EXTRACTION W/PHACO Right 10/26/2014   Procedure: CATARACT EXTRACTION PHACO AND INTRAOCULAR LENS PLACEMENT (IOC);  Surgeon: William Porfilio, MD;  Location: [facility];  Service: Ophthalmology;  Laterality: Right;  LOT PACK: [id number] US:01:43.0 AP:27.8 CDE:28.68  . CATARACT EXTRACTION W/PHACO Left 11/13/2016   Procedure:  CATARACT EXTRACTION PHACO AND INTRAOCULAR LENS PLACEMENT (IOC);  Surgeon: Porfilio, William, MD;  Location: [facility];  Service: Ophthalmology;  Laterality: Left;  US 01:00.2 AP% 15.7 CDE 9.47 Fluid Pack lot # [id number]  . COLONOSCOPY WITH PROPOFOL [name] 09/27/2015   Procedure: COLONOSCOPY WITH PROPOFOL;  Surgeon: Martin K Johnson, MD;  [facility];  Service: Endoscopy;  Laterality: [name];  . CORONARY ARTERY BYPASS GRAFT  04/2011   Severe multivessel ASCAD s/p Stemi w PTCA of OM2 then s/p CABG w LIMA to LAD,SVG to OM1 /RI and SVG to Distal RCA   . FINGER SURGERY     left finger for ganglion cyst  . KIDNEY STONE SURGERY      Current Medications: Current Meds  Medication Sig  . Coenzyme Q10 (COQ-10 PO) Take 300 mg daily by mouth.  . doxepin (SINEQUAN) 25 MG capsule Take 25 mg at bedtime as needed by mouth (for sleep.).   . Homeopathic Products (EARACHE DROPS OT) Place 1-2 drops 3 (three) times daily as needed in ear(s) (for ear pain.).  . metoprolol tartrate (LOPRESSOR) 50 MG tablet TAKE 1 TABLET BY MOUTH 2 TIMES DAILY. MAKE OVERDUE APPT WITH DR BEFORE ANYMORE REFILLS. 2ND ATTEMPT  . mometasone (NASONEX) 50 MCG/ACT nasal spray Place 2 sprays daily as needed into the nose. For allergies.  . pravastatin (PRAVACHOL) 40 MG tablet TAKE 1.5 TABLETS (60 MG TOTAL) BY MOUTH DAILY.  . triamcinolone ointment (KENALOG) 0.1 % Apply 1 application 2 (two) times daily as needed topically. For redness on chest.  . vitamin B-12 (CYANOCOBALAMIN) 100 MCG tablet Take 100 mcg at bedtime by mouth.   . XARELTO 20 MG TABS tablet TAKE 1 TABLET (20 MG TOTAL) BY MOUTH DAILY WITH SUPPER.  . zolpidem (AMBIEN) 5 MG tablet Take 2.5-5 mg at bedtime as needed by mouth. For sleep.     Allergies:   Celebrex [celecoxib]   Social History   Socioeconomic History  . Marital status: Married    Spouse name: Not on file  . Number of children: Not on file  . Years of education: Not on file  . Highest education level: Not  on file  Occupational History  . Not on file  Social Needs  . Financial resource strain: Not on file  . Food insecurity    Worry: Not on file    Inability: Not on file  . Transportation needs    Medical: Not on file    Non-medical: Not on file  Tobacco Use  . Smoking status: Never Smoker  . Smokeless tobacco: Never Used  Substance and Sexual Activity  . Alcohol use: Yes    Comment: yes occassionally  . Drug use: No  .   Sexual activity: Not on file  Lifestyle  . Physical activity    Days per week: Not on file    Minutes per session: Not on file  . Stress: Not on file  Relationships  . Social connections    Talks on phone: Not on file    Gets together: Not on file    Attends religious service: Not on file    Active member of club or organization: Not on file    Attends meetings of clubs or organizations: Not on file    Relationship status: Not on file  Other Topics Concern  . Not on file  Social History Narrative  . Not on file     Family History: The patient's family history includes Alzheimer's disease in his mother; Arrhythmia in his father and sister; CVA in his father; Cancer in his mother.  ROS:   Please see the history of present illness.    ROS  All other systems reviewed and negative.   EKGs/Labs/Other Studies Reviewed:    The following studies were reviewed today: none  EKG:  EKG is  ordered today.  The ekg ordered today demonstrates atrial fibrillation with CVR  Recent Labs: 12/03/2017: ALT 25   Recent Lipid Panel    Component Value Date/Time   CHOL 124 12/03/2017 0928   CHOL 123 01/14/2013 0923   TRIG 151 (H) 12/03/2017 0928   TRIG 141 01/14/2013 0923   HDL 31 (L) 12/03/2017 0928   HDL 34 (L) 01/14/2013 0923   CHOLHDL 4.0 12/03/2017 0928   CHOLHDL 5.8 (H) 12/20/2015 0914   VLDL 38 (H) 12/20/2015 0914   LDLCALC 63 12/03/2017 0928   LDLCALC 61 01/14/2013 0923    Physical Exam:    VS:  BP (!) 146/88   Pulse 79   Ht 5' 8.5" (1.74 m)    Wt 186 lb (84.4 kg)   BMI 27.87 kg/m     Wt Readings from Last 3 Encounters:  11/04/18 186 lb (84.4 kg)  06/18/18 184 lb (83.5 kg)  02/24/18 186 lb (84.4 kg)     GEN:  Well nourished, well developed in no acute distress HEENT: Normal NECK: No JVD; No carotid bruits LYMPHATICS: No lymphadenopathy CARDIAC: RRR, no murmurs, rubs, gallops RESPIRATORY:  Clear to auscultation without rales, wheezing or rhonchi  ABDOMEN: Soft, non-tender, non-distended MUSCULOSKELETAL:  No edema; No deformity  SKIN: Warm and dry NEUROLOGIC:  Alert and oriented x 3 PSYCHIATRIC:  Normal affect   ASSESSMENT:    1. Coronary artery disease involving native coronary artery of native heart without angina pectoris   2. Benign essential HTN   3. Bilateral carotid artery stenosis   4. Permanent atrial fibrillation (HCC)   5. Dilatation of aorta (HCC)   6. OSA (obstructive sleep apnea)   7. Mixed hyperlipidemia    PLAN:    In order of problems listed above:  1.  ASCAD -s/p CABG 4 2013 in setting of MI in [place] with initial PTCA of the OM 2 and subsequent CABG with LIMA to the LAD, SVG to diagonal 1, SVG to OM1/R1 and SVG to distal RCA. -denies any anginal CP but is having some indigestion sx in his upper abdomen and epigastrium that resolves with ASA -I will get a Lexiscan myoview to rule out ischemia -continue statin and BB -no ASA due to DOAC  2.  HTN -BP is borderline controlled -continue Lopressor 50mg BID -add Losartan 25mg daily and check BMET in 1 week -check   BP daily for a week and call with results  3.  Bilateral carotid artery disease -dopplers 02/2018 showed no evidence of carotid disease  4.  Permanent atrial fibrillation -HR controlled -continue Lopressor 50mg BID and Xarelto 20mg daily -creatinine stable at 1.07 in August -check Hbg  5.  Moderately dilated aortic root -aortic root 41mm and ascending aortic dimension 46mm by echo 2018 -chest CTA 06/2018 showed  stable 4.3-4.4cm aortic root and 4.5-4.6 ascending thoracic aorta -followed by Dr. Bartle -continue statin  6.  OSA  -he stopped using CPAP a few years ago because he did not tolerate it.    7.  HLD -LDL goal < 70 -continue pravastatin 60mg daily -check FLp and ALT  8.  Depression -he has been feeling down and problems sleeping since his wife died recently -I encouraged him to call his PCP to talk about getting on an antidepressant    Medication Adjustments/Labs and Tests Ordered: Current medicines are reviewed at length with the patient today.  Concerns regarding medicines are outlined above.  Orders Placed This Encounter  Procedures  . Lipid Profile  . Comp Met (CMET)  . CBC  . Myocardial Perfusion Imaging  . EKG 12-Lead   Meds ordered this encounter  Medications  . losartan (COZAAR) 25 MG tablet    Sig: Take 1 tablet (25 mg total) by mouth daily.    Dispense:  90 tablet    Refill:  1    Signed, Traci Turner, MD  11/04/2018 4:45 PM    [facility] 

## 2018-11-03 NOTE — Telephone Encounter (Signed)
Xarelto 20mg  refill request received. Pt is 73 years old, weight-83.5kg, Crea-1.35 on 01/04/2017-needs labs will note on OV pending on 12/16/2018, last seen by Estella Husk on 08/14/2017-has an appt pending on 12/16/2018 with [name], Diagnosis-Afib, CrCl-57.20ml/min; Dose is appropriate based on dosing criteria. Will send in refill to requested pharmacy.   Called PCP and they do not have any recent labs on the pt per Mardene Celeste. Since pt has an appt pending on 12/16/2018 with [name] will send in a 30 day supply at this time.

## 2018-11-04 ENCOUNTER — Ambulatory Visit (INDEPENDENT_AMBULATORY_CARE_PROVIDER_SITE_OTHER): Payer: Medicare Other | Admitting: Cardiology

## 2018-11-04 ENCOUNTER — Other Ambulatory Visit: Payer: Self-pay

## 2018-11-04 ENCOUNTER — Encounter: Payer: Self-pay | Admitting: Cardiology

## 2018-11-04 ENCOUNTER — Encounter: Payer: Self-pay | Admitting: *Deleted

## 2018-11-04 VITALS — BP 146/88 | HR 79 | Ht 68.5 in | Wt 186.0 lb

## 2018-11-04 DIAGNOSIS — I77819 Aortic ectasia, unspecified site: Secondary | ICD-10-CM

## 2018-11-04 DIAGNOSIS — I6523 Occlusion and stenosis of bilateral carotid arteries: Secondary | ICD-10-CM

## 2018-11-04 DIAGNOSIS — I2510 Atherosclerotic heart disease of native coronary artery without angina pectoris: Secondary | ICD-10-CM

## 2018-11-04 DIAGNOSIS — I4821 Permanent atrial fibrillation: Secondary | ICD-10-CM

## 2018-11-04 DIAGNOSIS — E782 Mixed hyperlipidemia: Secondary | ICD-10-CM | POA: Diagnosis not present

## 2018-11-04 DIAGNOSIS — I10 Essential (primary) hypertension: Secondary | ICD-10-CM

## 2018-11-04 DIAGNOSIS — G4733 Obstructive sleep apnea (adult) (pediatric): Secondary | ICD-10-CM | POA: Diagnosis not present

## 2018-11-04 MED ORDER — LOSARTAN POTASSIUM 25 MG PO TABS: 25.0000 mg | ORAL_TABLET | Freq: Every day | ORAL | 1 refills | Status: DC

## 2018-11-04 NOTE — Patient Instructions (Signed)
Medication Instructions:   START TAKING LOSARTAN 25 MG BY MOUTH DAILY  *If you need a refill on your cardiac medications before your next appointment, please call your pharmacy*    Lab Work:  THIS WEEK TO CHECK LIPIDS, CMET, AND CBC--PLEASE COME FASTING TO THIS LAB APPOINTMENT  If you have labs (blood work) drawn today and your tests are completely normal, you will receive your results only by: [name] MyChart Message (if you have MyChart) OR . A paper copy in the mail If you have any lab test that is abnormal or we need to change your treatment, we will call you to review the results.   Testing/Procedures:  Your physician has requested that you have a lexiscan myoview. For further information please visit [email or web address]. Please follow instruction sheet, as given.    Follow-Up: At [facility], you and your health needs are our priority.  As part of our continuing mission to provide you with exceptional heart care, we have created designated Provider Care Teams.  These Care Teams include your primary Cardiologist (physician) and Advanced Practice Providers (APPs -  Physician Assistants and Nurse Practitioners) who all work together to provide you with the care you need, when you need it.  Your next appointment:   6 months  The format for your next appointment:   Either In Person or Virtual  Provider:   Fransico Him, MD    Other Instructions  DR. TURNER WOULD LIKE FOR YOUR TO CHECK YOUR BLOOD PRESSURE FOR ONE WEEK, WRITE THIS DOWN DAILY, AND CALL YOUR NUMBERS INTO THE OFFICE AT [phone] OR MYCHART [place] YOUR VALUES.

## 2018-11-06 ENCOUNTER — Telehealth (HOSPITAL_COMMUNITY): Payer: Self-pay

## 2018-11-06 NOTE — Telephone Encounter (Signed)
Spoke with the patient, instructions given. He stated that he would be here for his test. Asked to call back with any questions. S.Therman Hughlett EMTP 

## 2018-11-07 ENCOUNTER — Other Ambulatory Visit: Payer: Medicare Other | Admitting: *Deleted

## 2018-11-07 ENCOUNTER — Other Ambulatory Visit: Payer: Self-pay

## 2018-11-07 DIAGNOSIS — E782 Mixed hyperlipidemia: Secondary | ICD-10-CM | POA: Diagnosis not present

## 2018-11-07 DIAGNOSIS — I6523 Occlusion and stenosis of bilateral carotid arteries: Secondary | ICD-10-CM

## 2018-11-07 DIAGNOSIS — I10 Essential (primary) hypertension: Secondary | ICD-10-CM

## 2018-11-07 DIAGNOSIS — G4733 Obstructive sleep apnea (adult) (pediatric): Secondary | ICD-10-CM

## 2018-11-07 DIAGNOSIS — I4821 Permanent atrial fibrillation: Secondary | ICD-10-CM

## 2018-11-07 DIAGNOSIS — I77819 Aortic ectasia, unspecified site: Secondary | ICD-10-CM

## 2018-11-07 DIAGNOSIS — I2510 Atherosclerotic heart disease of native coronary artery without angina pectoris: Secondary | ICD-10-CM | POA: Diagnosis not present

## 2018-11-07 LAB — COMPREHENSIVE METABOLIC PANEL
ALT: 18 IU/L (ref 0–44)
AST: 16 IU/L (ref 0–40)
Albumin/Globulin Ratio: 1.7 (ref 1.2–2.2)
Albumin: 4.4 g/dL (ref 3.7–4.7)
Alkaline Phosphatase: 71 IU/L (ref 39–117)
BUN/Creatinine Ratio: 13 (ref 10–24)
BUN: 17 mg/dL (ref 8–27)
Bilirubin Total: 1.1 mg/dL (ref 0.0–1.2)
CO2: 23 mmol/L (ref 20–29)
Calcium: 9.5 mg/dL (ref 8.6–10.2)
Chloride: 101 mmol/L (ref 96–106)
Creatinine, Ser: 1.31 mg/dL — ABNORMAL HIGH (ref 0.76–1.27)
GFR calc Af Amer: 62 mL/min/{1.73_m2} (ref >59)
GFR calc non Af Amer: 54 mL/min/{1.73_m2} — ABNORMAL LOW (ref >59)
Globulin, Total: 2.6 g/dL (ref 1.5–4.5)
Glucose: 91 mg/dL (ref 65–99)
Potassium: 4.2 mmol/L (ref 3.5–5.2)
Sodium: 142 mmol/L (ref 134–144)
Total Protein: 7 g/dL (ref 6.0–8.5)

## 2018-11-07 LAB — CBC
Hematocrit: 46.2 % (ref 37.5–51.0)
Hemoglobin: 15.4 g/dL (ref 13.0–17.7)
MCH: 31.3 pg (ref 26.6–33.0)
MCHC: 33.3 g/dL (ref 31.5–35.7)
MCV: 94 fL (ref 79–97)
Platelets: 168 10*3/uL (ref 150–450)
RBC: 4.92 x10E6/uL (ref 4.14–5.80)
RDW: 13.1 % (ref 11.6–15.4)
WBC: 9.4 10*3/uL (ref 3.4–10.8)

## 2018-11-07 LAB — LIPID PANEL
Chol/HDL Ratio: 4.3 ratio (ref 0.0–5.0)
Cholesterol, Total: 145 mg/dL (ref 100–199)
HDL: 34 mg/dL — ABNORMAL LOW (ref >39)
LDL Chol Calc (NIH): 82 mg/dL (ref 0–99)
Triglycerides: 167 mg/dL — ABNORMAL HIGH (ref 0–149)
VLDL Cholesterol Cal: 29 mg/dL (ref 5–40)

## 2018-11-10 ENCOUNTER — Ambulatory Visit (HOSPITAL_COMMUNITY): Payer: Medicare Other | Attending: Internal Medicine

## 2018-11-10 ENCOUNTER — Other Ambulatory Visit: Payer: Self-pay

## 2018-11-10 ENCOUNTER — Other Ambulatory Visit: Payer: Self-pay | Admitting: Cardiology

## 2018-11-10 DIAGNOSIS — G4733 Obstructive sleep apnea (adult) (pediatric): Secondary | ICD-10-CM | POA: Diagnosis not present

## 2018-11-10 DIAGNOSIS — I6523 Occlusion and stenosis of bilateral carotid arteries: Secondary | ICD-10-CM | POA: Insufficient documentation

## 2018-11-10 DIAGNOSIS — I10 Essential (primary) hypertension: Secondary | ICD-10-CM | POA: Diagnosis not present

## 2018-11-10 DIAGNOSIS — I2510 Atherosclerotic heart disease of native coronary artery without angina pectoris: Secondary | ICD-10-CM

## 2018-11-10 DIAGNOSIS — E782 Mixed hyperlipidemia: Secondary | ICD-10-CM | POA: Insufficient documentation

## 2018-11-10 DIAGNOSIS — I4821 Permanent atrial fibrillation: Secondary | ICD-10-CM | POA: Insufficient documentation

## 2018-11-10 DIAGNOSIS — I77819 Aortic ectasia, unspecified site: Secondary | ICD-10-CM | POA: Diagnosis not present

## 2018-11-10 LAB — MYOCARDIAL PERFUSION IMAGING
LV dias vol: 112 mL (ref 62–150)
LV sys vol: 60 mL
Peak HR: 90 {beats}/min
Rest HR: 68 {beats}/min
SDS: 1
SRS: 7
SSS: 8
TID: 0.98

## 2018-11-10 MED ORDER — TECHNETIUM TC 99M TETROFOSMIN IV KIT
10.2000 | PACK | Freq: Once | INTRAVENOUS | Status: AC | PRN
  Administered 2018-11-10: 10.2 via INTRAVENOUS
  Filled 2018-11-10: qty 11

## 2018-11-10 MED ORDER — REGADENOSON 0.4 MG/5ML IV SOLN
0.4000 mg | Freq: Once | INTRAVENOUS | Status: AC
  Administered 2018-11-10: 0.4 mg via INTRAVENOUS

## 2018-11-10 MED ORDER — TECHNETIUM TC 99M TETROFOSMIN IV KIT
30.5000 | PACK | Freq: Once | INTRAVENOUS | Status: AC | PRN
  Administered 2018-11-10: 30.5 via INTRAVENOUS
  Filled 2018-11-10: qty 31

## 2018-11-11 ENCOUNTER — Telehealth: Payer: Self-pay | Admitting: *Deleted

## 2018-11-11 ENCOUNTER — Other Ambulatory Visit: Payer: Self-pay | Admitting: Cardiology

## 2018-11-11 ENCOUNTER — Telehealth: Payer: Self-pay

## 2018-11-11 DIAGNOSIS — R9439 Abnormal result of other cardiovascular function study: Secondary | ICD-10-CM

## 2018-11-11 DIAGNOSIS — I2510 Atherosclerotic heart disease of native coronary artery without angina pectoris: Secondary | ICD-10-CM

## 2018-11-11 DIAGNOSIS — E782 Mixed hyperlipidemia: Secondary | ICD-10-CM

## 2018-11-11 MED ORDER — PRAVASTATIN SODIUM 80 MG PO TABS: 80.0000 mg | ORAL_TABLET | Freq: Every evening | ORAL | 3 refills | Status: DC

## 2018-11-11 NOTE — Telephone Encounter (Signed)
-----   Message from Sueanne Margarita, MD sent at 11/10/2018  7:07 PM EST ----- Stress test showed no ischemia.  ? Lateral wall motion abnormality - please repeat 2D echo to reassess wall motiont

## 2018-11-11 NOTE — Telephone Encounter (Signed)
-----   Message from Sueanne Margarita, MD sent at 11/10/2018  7:11 PM EST ----- LDL not at goal - increase pravastatin to 80mg  daily and repeat FLP and ALT in 6 week

## 2018-11-11 NOTE — Telephone Encounter (Signed)
The patient has been notified of the result and verbalized understanding.  All questions (if any) were answered. Patient will increase pravastatin to 80mg  daily and repeat labs 12/23/18.

## 2018-11-11 NOTE — Telephone Encounter (Signed)
Spoke with patient.  Informed of review/recommendations by [name].  Echo scheduled for 11/17/18.

## 2018-11-19 ENCOUNTER — Other Ambulatory Visit: Payer: Self-pay

## 2018-11-19 ENCOUNTER — Ambulatory Visit (HOSPITAL_COMMUNITY): Payer: Medicare Other | Attending: Cardiovascular Disease

## 2018-11-19 DIAGNOSIS — R9439 Abnormal result of other cardiovascular function study: Secondary | ICD-10-CM | POA: Diagnosis not present

## 2018-11-19 DIAGNOSIS — I2510 Atherosclerotic heart disease of native coronary artery without angina pectoris: Secondary | ICD-10-CM

## 2018-11-20 ENCOUNTER — Telehealth: Payer: Self-pay | Admitting: Nurse Practitioner

## 2018-11-20 DIAGNOSIS — I2510 Atherosclerotic heart disease of native coronary artery without angina pectoris: Secondary | ICD-10-CM

## 2018-11-20 DIAGNOSIS — Z951 Presence of aortocoronary bypass graft: Secondary | ICD-10-CM

## 2018-11-20 DIAGNOSIS — R9439 Abnormal result of other cardiovascular function study: Secondary | ICD-10-CM

## 2018-11-20 MED ORDER — SPIRONOLACTONE 25 MG PO TABS: 12.5000 mg | ORAL_TABLET | Freq: Every day | ORAL | 3 refills | Status: DC

## 2018-11-20 MED ORDER — ENTRESTO 24-26 MG PO TABS: 1.0000 | ORAL_TABLET | Freq: Two times a day (BID) | ORAL | 11 refills | Status: DC

## 2018-11-20 NOTE — Telephone Encounter (Signed)
-----   Message from Sueanne Margarita, MD sent at 11/19/2018  7:03 PM EST ----- Echo showed moderately reduced LVF with EF 35-40%, moderately dilated LV, severe enlargement of LA, moderately leaky MV, mildly leaky TV, mildly leaky AV and PV and moderate enlargement of ascending aorta which is stable at 4.5cm.  Forward to Dr. [name].  Please see nuclear stress test results for recommendations regarding decreased LVF which is new

## 2018-11-20 NOTE — Telephone Encounter (Signed)
Reviewed test results with patient who verbalized understanding. He agrees to treatment plan including plan for cardiac cath and to stop losartan and start Entresto 24-51 mg BID and spironolactone 12.5 mg daily.  Patient has been scheduled for cardiac cath on Tuesday Dec. 1 with Dr. Tamala Julian. He is scheduled for Covid screening on Friday Nov. 27. He will either get lab work done on Nov. 27 at [facility] or will come to our office on Tuesday Nov. 24 or Wed. Nov. 25 for lab. I am going to call him back later to verify location. Instructions sent through patient's MyChart and I advised him to call back with any questions or concerns. He verbalized understanding and agreement and thanked me for the call.

## 2018-11-24 ENCOUNTER — Telehealth: Payer: Self-pay

## 2018-11-24 NOTE — Telephone Encounter (Signed)
I started an [place] PA through covermymeds. Key: [id number]

## 2018-11-25 NOTE — Telephone Encounter (Signed)
**Note De-Identified Aja Bolander Obfuscation** Message from Covermymeds:  Hartford Wierenga (Key: [id number])  Rx #: [id number]  Delene Loll 24-26MG  tablets  Form FEP Electronic PA Form (NCPDP)   Determination  Favorable  1 day ago  Message from Plan Your PA request has been approved. Additional information will be provided in the approval communication. (Message 1145)

## 2018-11-26 ENCOUNTER — Other Ambulatory Visit: Payer: Self-pay | Admitting: Cardiology

## 2018-11-26 NOTE — Telephone Encounter (Signed)
Age 73, weight 84.4kg, SCr 1.31 on 11/07/18, CrCl 22mL/min. Last OV Nov 2020, afib indication

## 2018-11-28 ENCOUNTER — Other Ambulatory Visit (HOSPITAL_COMMUNITY)
Admission: RE | Admit: 2018-11-28 | Discharge: 2018-11-28 | Disposition: A | Discharge status: Home / Self Care | Payer: Medicare Other | Source: Ambulatory Visit | Attending: Interventional Cardiology | Admitting: Interventional Cardiology

## 2018-11-28 ENCOUNTER — Telehealth: Payer: Self-pay | Admitting: Physician Assistant

## 2018-11-28 DIAGNOSIS — I2510 Atherosclerotic heart disease of native coronary artery without angina pectoris: Secondary | ICD-10-CM

## 2018-11-28 DIAGNOSIS — Z01812 Encounter for preprocedural laboratory examination: Secondary | ICD-10-CM | POA: Diagnosis not present

## 2018-11-28 DIAGNOSIS — Z20828 Contact with and (suspected) exposure to other viral communicable diseases: Secondary | ICD-10-CM | POA: Diagnosis not present

## 2018-11-28 DIAGNOSIS — R9439 Abnormal result of other cardiovascular function study: Secondary | ICD-10-CM

## 2018-11-28 LAB — SARS CORONAVIRUS 2 (TAT 6-24 HRS): SARS Coronavirus 2: NEGATIVE

## 2018-11-28 NOTE — Telephone Encounter (Signed)
   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 Received message from cath lab nurse Rennis Harding that patient went to get pre-cath labs done today but our office is closed. He has gone for his Covid test. His cath is on Tuesday 12/1. She has instructed him to present to the office Monday to get them done. He said he will come first thing. Triage, can you help change Dr. Theodosia Blender labs to stat so that they come back in time for review prior to cath? (CBC, BMET) Thanks. Travaris Kosh

## 2018-12-01 ENCOUNTER — Other Ambulatory Visit: Payer: Medicare Other | Admitting: *Deleted

## 2018-12-01 ENCOUNTER — Telehealth: Payer: Self-pay | Admitting: *Deleted

## 2018-12-01 ENCOUNTER — Other Ambulatory Visit: Payer: Self-pay

## 2018-12-01 DIAGNOSIS — I2510 Atherosclerotic heart disease of native coronary artery without angina pectoris: Secondary | ICD-10-CM | POA: Diagnosis not present

## 2018-12-01 DIAGNOSIS — R9439 Abnormal result of other cardiovascular function study: Secondary | ICD-10-CM

## 2018-12-01 DIAGNOSIS — R931 Abnormal findings on diagnostic imaging of heart and coronary circulation: Secondary | ICD-10-CM

## 2018-12-01 LAB — CBC
Hematocrit: 49.5 % (ref 37.5–51.0)
Hemoglobin: 16.7 g/dL (ref 13.0–17.7)
MCH: 31.4 pg (ref 26.6–33.0)
MCHC: 33.7 g/dL (ref 31.5–35.7)
MCV: 93 fL (ref 79–97)
Platelets: 175 10*3/uL (ref 150–450)
RBC: 5.32 x10E6/uL (ref 4.14–5.80)
RDW: 13.8 % (ref 11.6–15.4)
WBC: 10 10*3/uL (ref 3.4–10.8)

## 2018-12-01 LAB — BASIC METABOLIC PANEL
BUN/Creatinine Ratio: 14 (ref 10–24)
BUN: 18 mg/dL (ref 8–27)
CO2: 25 mmol/L (ref 20–29)
Calcium: 9.1 mg/dL (ref 8.6–10.2)
Chloride: 102 mmol/L (ref 96–106)
Creatinine, Ser: 1.31 mg/dL — ABNORMAL HIGH (ref 0.76–1.27)
GFR calc Af Amer: 62 mL/min/{1.73_m2} (ref >59)
GFR calc non Af Amer: 54 mL/min/{1.73_m2} — ABNORMAL LOW (ref >59)
Glucose: 109 mg/dL — ABNORMAL HIGH (ref 65–99)
Potassium: 4.4 mmol/L (ref 3.5–5.2)
Sodium: 137 mmol/L (ref 134–144)

## 2018-12-01 NOTE — Telephone Encounter (Signed)
Pt contacted pre-catheterization scheduled at [facility] for: Tuesday December 02, 2018 7:30 AM Verified arrival time and place: [facility]) at: 5:30 AM   No solid food after midnight prior to cath, clear liquids until 5 AM day of procedure. Contrast allergy: no  Hold: Spironolactone-AM of procedure-GFR 54-pt already taken today. Entresto-pt states he has not started yet. Xarelto-none 11/30/18 until post procedure.  Except hold medications AM meds can be  taken pre-cath with sip of water including: ASA 81 mg-pt states tolerates aspirin.   Confirmed patient has responsible adult to drive home post procedure and observe 24 hours after arriving home: yes  Currently, due to Covid-19 pandemic, only one support person will be allowed with patient. Must be the same support person for that patient's entire stay, will be screened and required to wear a mask. They will be asked to wait in the waiting room for the duration of the patient's stay.  Patients are required to wear a mask when they enter the hospital.      COVID-19 Pre-Screening Questions:  . In the past 7 to 10 days have you had a cough,  shortness of breath, headache, congestion, fever (100 or greater) body aches, chills, sore throat, or sudden loss of taste or sense of smell? no . Have you been around anyone with known Covid 19? no . Have you been around anyone who is awaiting Covid 19 test results in the past 7 to 10 days? no . Have you been around anyone who has been exposed to Covid 19, or has mentioned symptoms of Covid 19 within the past 7 to 10 days? no   I reviewed procedure/mask/visitor instructions, Covid-19 screening questions with patient, he verbalized understanding, thanked me for call.

## 2018-12-01 NOTE — H&P (Signed)
Indigestion Prior CABG'Abn Nuc with lateral wall MI/Scar Minimal anti-ischemic therapy

## 2018-12-01 NOTE — Telephone Encounter (Signed)
I entered orders for STAT BMP/CBC to be done today, Monday November 30. I called patient to confirm that he was going to [facility] to get lab this morning, when I spoke with him he was at the check-in desk first floor [facility].

## 2018-12-02 ENCOUNTER — Encounter (HOSPITAL_COMMUNITY)
Admission: RE | Disposition: A | Discharge status: Home / Self Care | Payer: Self-pay | Source: Home / Self Care | Attending: Interventional Cardiology

## 2018-12-02 ENCOUNTER — Other Ambulatory Visit: Payer: Self-pay

## 2018-12-02 ENCOUNTER — Encounter (HOSPITAL_COMMUNITY): Payer: Self-pay | Admitting: Interventional Cardiology

## 2018-12-02 ENCOUNTER — Ambulatory Visit (HOSPITAL_COMMUNITY)
Admission: RE | Admit: 2018-12-02 | Discharge: 2018-12-02 | Disposition: A | Discharge status: Home / Self Care | Payer: Medicare Other | Attending: Interventional Cardiology | Admitting: Interventional Cardiology

## 2018-12-02 DIAGNOSIS — I2510 Atherosclerotic heart disease of native coronary artery without angina pectoris: Secondary | ICD-10-CM | POA: Diagnosis not present

## 2018-12-02 DIAGNOSIS — R931 Abnormal findings on diagnostic imaging of heart and coronary circulation: Secondary | ICD-10-CM

## 2018-12-02 DIAGNOSIS — E781 Pure hyperglyceridemia: Secondary | ICD-10-CM | POA: Diagnosis not present

## 2018-12-02 DIAGNOSIS — Z886 Allergy status to analgesic agent status: Secondary | ICD-10-CM | POA: Diagnosis not present

## 2018-12-02 DIAGNOSIS — E782 Mixed hyperlipidemia: Secondary | ICD-10-CM | POA: Diagnosis not present

## 2018-12-02 DIAGNOSIS — I2582 Chronic total occlusion of coronary artery: Secondary | ICD-10-CM | POA: Diagnosis not present

## 2018-12-02 DIAGNOSIS — I6523 Occlusion and stenosis of bilateral carotid arteries: Secondary | ICD-10-CM | POA: Insufficient documentation

## 2018-12-02 DIAGNOSIS — I10 Essential (primary) hypertension: Secondary | ICD-10-CM | POA: Diagnosis not present

## 2018-12-02 DIAGNOSIS — I4819 Other persistent atrial fibrillation: Secondary | ICD-10-CM | POA: Diagnosis present

## 2018-12-02 DIAGNOSIS — I25810 Atherosclerosis of coronary artery bypass graft(s) without angina pectoris: Secondary | ICD-10-CM

## 2018-12-02 DIAGNOSIS — I4821 Permanent atrial fibrillation: Secondary | ICD-10-CM | POA: Insufficient documentation

## 2018-12-02 DIAGNOSIS — Z7951 Long term (current) use of inhaled steroids: Secondary | ICD-10-CM | POA: Insufficient documentation

## 2018-12-02 DIAGNOSIS — G4733 Obstructive sleep apnea (adult) (pediatric): Secondary | ICD-10-CM | POA: Diagnosis not present

## 2018-12-02 DIAGNOSIS — I252 Old myocardial infarction: Secondary | ICD-10-CM | POA: Diagnosis not present

## 2018-12-02 DIAGNOSIS — M1990 Unspecified osteoarthritis, unspecified site: Secondary | ICD-10-CM | POA: Insufficient documentation

## 2018-12-02 DIAGNOSIS — Z7901 Long term (current) use of anticoagulants: Secondary | ICD-10-CM | POA: Insufficient documentation

## 2018-12-02 DIAGNOSIS — N400 Enlarged prostate without lower urinary tract symptoms: Secondary | ICD-10-CM | POA: Insufficient documentation

## 2018-12-02 DIAGNOSIS — Z951 Presence of aortocoronary bypass graft: Secondary | ICD-10-CM | POA: Insufficient documentation

## 2018-12-02 DIAGNOSIS — K219 Gastro-esophageal reflux disease without esophagitis: Secondary | ICD-10-CM | POA: Diagnosis not present

## 2018-12-02 DIAGNOSIS — Z79899 Other long term (current) drug therapy: Secondary | ICD-10-CM | POA: Diagnosis not present

## 2018-12-02 DIAGNOSIS — E785 Hyperlipidemia, unspecified: Secondary | ICD-10-CM | POA: Insufficient documentation

## 2018-12-02 DIAGNOSIS — F329 Major depressive disorder, single episode, unspecified: Secondary | ICD-10-CM | POA: Diagnosis not present

## 2018-12-02 DIAGNOSIS — K30 Functional dyspepsia: Secondary | ICD-10-CM | POA: Diagnosis not present

## 2018-12-02 DIAGNOSIS — M109 Gout, unspecified: Secondary | ICD-10-CM | POA: Diagnosis not present

## 2018-12-02 DIAGNOSIS — I77819 Aortic ectasia, unspecified site: Secondary | ICD-10-CM | POA: Diagnosis present

## 2018-12-02 HISTORY — PX: LEFT HEART CATH AND CORS/GRAFTS ANGIOGRAPHY: CATH118250

## 2018-12-02 SURGERY — LEFT HEART CATH AND CORS/GRAFTS ANGIOGRAPHY
Anesthesia: LOCAL

## 2018-12-02 MED ORDER — SODIUM CHLORIDE 0.9 % IV SOLN: 250.0000 mL | INTRAVENOUS | Status: DC | PRN

## 2018-12-02 MED ORDER — SODIUM CHLORIDE 0.9% FLUSH: 3.0000 mL | Freq: Two times a day (BID) | INTRAVENOUS | Status: DC

## 2018-12-02 MED ORDER — MIDAZOLAM HCL 2 MG/2ML IJ SOLN
INTRAMUSCULAR | Status: DC | PRN
  Administered 2018-12-02: 1 mg via INTRAVENOUS

## 2018-12-02 MED ORDER — LIDOCAINE HCL (PF) 1 % IJ SOLN
INTRAMUSCULAR | Status: DC | PRN
  Administered 2018-12-02: 2 mL

## 2018-12-02 MED ORDER — SODIUM CHLORIDE 0.9% FLUSH: 3.0000 mL | INTRAVENOUS | Status: DC | PRN

## 2018-12-02 MED ORDER — HEPARIN SODIUM (PORCINE) 1000 UNIT/ML IJ SOLN
INTRAMUSCULAR | Status: AC
  Filled 2018-12-02: qty 1

## 2018-12-02 MED ORDER — HEPARIN SODIUM (PORCINE) 1000 UNIT/ML IJ SOLN
INTRAMUSCULAR | Status: DC | PRN
  Administered 2018-12-02: 4000 [IU] via INTRAVENOUS

## 2018-12-02 MED ORDER — ACETAMINOPHEN 325 MG PO TABS
650.0000 mg | ORAL_TABLET | ORAL | Status: DC | PRN
  Administered 2018-12-02: 650 mg via ORAL

## 2018-12-02 MED ORDER — HEPARIN (PORCINE) IN NACL 1000-0.9 UT/500ML-% IV SOLN
INTRAVENOUS | Status: AC
  Filled 2018-12-02: qty 1000

## 2018-12-02 MED ORDER — VERAPAMIL HCL 2.5 MG/ML IV SOLN
INTRAVENOUS | Status: AC
  Filled 2018-12-02: qty 2

## 2018-12-02 MED ORDER — ACETAMINOPHEN 325 MG PO TABS
ORAL_TABLET | ORAL | Status: AC
  Filled 2018-12-02: qty 2

## 2018-12-02 MED ORDER — SODIUM CHLORIDE 0.9 % IV SOLN
INTRAVENOUS | Status: DC
  Administered 2018-12-02: 07:00:00 via INTRAVENOUS

## 2018-12-02 MED ORDER — FENTANYL CITRATE (PF) 100 MCG/2ML IJ SOLN
INTRAMUSCULAR | Status: AC
  Filled 2018-12-02: qty 2

## 2018-12-02 MED ORDER — LIDOCAINE HCL (PF) 1 % IJ SOLN
INTRAMUSCULAR | Status: AC
  Filled 2018-12-02: qty 30

## 2018-12-02 MED ORDER — VERAPAMIL HCL 2.5 MG/ML IV SOLN
INTRAVENOUS | Status: DC | PRN
  Administered 2018-12-02: 10 mL via INTRA_ARTERIAL

## 2018-12-02 MED ORDER — IOHEXOL 350 MG/ML SOLN
INTRAVENOUS | Status: DC | PRN
  Administered 2018-12-02: 165 mL

## 2018-12-02 MED ORDER — SODIUM CHLORIDE 0.9 % IV SOLN: INTRAVENOUS | Status: DC

## 2018-12-02 MED ORDER — FENTANYL CITRATE (PF) 100 MCG/2ML IJ SOLN
INTRAMUSCULAR | Status: DC | PRN
  Administered 2018-12-02: 25 ug via INTRAVENOUS

## 2018-12-02 MED ORDER — ASPIRIN 81 MG PO CHEW: 81.0000 mg | CHEWABLE_TABLET | ORAL | Status: DC

## 2018-12-02 MED ORDER — MIDAZOLAM HCL 2 MG/2ML IJ SOLN
INTRAMUSCULAR | Status: AC
  Filled 2018-12-02: qty 2

## 2018-12-02 MED ORDER — HEPARIN (PORCINE) IN NACL 1000-0.9 UT/500ML-% IV SOLN
INTRAVENOUS | Status: DC | PRN
  Administered 2018-12-02 (×3): 500 mL

## 2018-12-02 SURGICAL SUPPLY — 13 items
CATH INFINITI 5FR JL4 (CATHETERS) ×2 IMPLANT
CATH INFINITI 5FR MPB2 (CATHETERS) ×2 IMPLANT
CATH INFINITI JR4 5F (CATHETERS) ×2 IMPLANT
DEVICE RAD COMP TR BAND LRG (VASCULAR PRODUCTS) ×2 IMPLANT
ELECT DEFIB PAD ADLT CADENCE (PAD) ×2 IMPLANT
GLIDESHEATH SLEND A-KIT 6F 22G (SHEATH) ×2 IMPLANT
GUIDEWIRE INQWIRE 1.5J.035X260 (WIRE) ×1 IMPLANT
INQWIRE 1.5J .035X260CM (WIRE) ×2
KIT HEART LEFT (KITS) ×2 IMPLANT
PACK CARDIAC CATHETERIZATION (CUSTOM PROCEDURE TRAY) ×2 IMPLANT
SHEATH PROBE COVER 6X72 (BAG) ×2 IMPLANT
TRANSDUCER W/STOPCOCK (MISCELLANEOUS) ×2 IMPLANT
TUBING CIL FLEX 10 FLL-RA (TUBING) ×2 IMPLANT

## 2018-12-02 NOTE — Telephone Encounter (Signed)
Letter received from [facility] stating that the pts Entresto PA is good from 10/25/2018 until 11/23/2020. I have notified [facility] of this approval.

## 2018-12-02 NOTE — Discharge Instructions (Signed)
Radial Site Care ° °This sheet gives you information about how to care for yourself after your procedure. Your health care provider may also give you more specific instructions. If you have problems or questions, contact your health care provider. °What can I expect after the procedure? °After the procedure, it is common to have: °· Bruising and tenderness at the catheter insertion area. °Follow these instructions at home: °Medicines °· Take over-the-counter and prescription medicines only as told by your health care provider. °Insertion site care °· Follow instructions from your health care provider about how to take care of your insertion site. Make sure you: °? Wash your hands with soap and water before you change your bandage (dressing). If soap and water are not available, use hand sanitizer. °? Change your dressing as told by your health care provider. °? Leave stitches (sutures), skin glue, or adhesive strips in place. These skin closures may need to stay in place for 2 weeks or longer. If adhesive strip edges start to loosen and curl up, you may trim the loose edges. Do not remove adhesive strips completely unless your health care provider tells you to do that. °· Check your insertion site every day for signs of infection. Check for: °? Redness, swelling, or pain. °? Fluid or blood. °? Pus or a bad smell. °? Warmth. °· Do not take baths, swim, or use a hot tub until your health care provider approves. °· You may shower 24-48 hours after the procedure, or as directed by your health care provider. °? Remove the dressing and gently wash the site with plain soap and water. °? Pat the area dry with a clean towel. °? Do not rub the site. That could cause bleeding. °· Do not apply powder or lotion to the site. °Activity ° °· For 24 hours after the procedure, or as directed by your health care provider: °? Do not flex or bend the affected arm. °? Do not push or pull heavy objects with the affected arm. °? Do not  drive yourself home from the hospital or clinic. You may drive 24 hours after the procedure unless your health care provider tells you not to. °? Do not operate machinery or power tools. °· Do not lift anything that is heavier than 10 lb (4.5 kg), or the limit that you are told, until your health care provider says that it is safe. °· Ask your health care provider when it is okay to: °? Return to work or school. °? Resume usual physical activities or sports. °? Resume sexual activity. °General instructions °· If the catheter site starts to bleed, raise your arm and put firm pressure on the site. If the bleeding does not stop, get help right away. This is a medical emergency. °· If you went home on the same day as your procedure, a responsible adult should be with you for the first 24 hours after you arrive home. °· Keep all follow-up visits as told by your health care provider. This is important. °Contact a health care provider if: °· You have a fever. °· You have redness, swelling, or yellow drainage around your insertion site. °Get help right away if: °· You have unusual pain at the radial site. °· The catheter insertion area swells very fast. °· The insertion area is bleeding, and the bleeding does not stop when you hold steady pressure on the area. °· Your arm or hand becomes pale, cool, tingly, or numb. °These symptoms may represent a serious problem   that is an emergency. Do not wait to see if the symptoms will go away. Get medical help right away. Call your local emergency services (911 in the [place] Do not drive yourself to the hospital. °Summary °· After the procedure, it is common to have bruising and tenderness at the site. °· Follow instructions from your health care provider about how to take care of your radial site wound. Check the wound every day for signs of infection. °· Do not lift anything that is heavier than 10 lb (4.5 kg), or the limit that you are told, until your health care provider says  that it is safe. °This information is not intended to replace advice given to you by your health care provider. Make sure you discuss any questions you have with your health care provider. °Document Released: 01/20/2010 Document Revised: 01/23/2017 Document Reviewed: 01/23/2017 °Elsevier Patient Education © 2020 [employer] ° °

## 2018-12-02 NOTE — Progress Notes (Signed)
Pt ambulated to bathroom, standby assist. Ambulated and voided without difficulty. No new bleeding noted.

## 2018-12-02 NOTE — CV Procedure (Signed)
   Left heart cath with coronary angiography, bypass graft angiography, left ventriculography via left radial.  Real-time vascular ultrasound for access.  Atretic LIMA to LAD  Occluded SVG to RCA  Patent SVG to diagonal  Patent SVG to to the ramus.  The side to side anastomosis with the obtuse marginal is occluded  60 to 80% proximal to mid LAD followed by diffuse disease with up to 95% in the distal to apical LAD.  80 to 95% ostial to mid large first diagonal which is supplied by the free SVG.  Large patulous right coronary with diffuse luminal irregularities, small PDA, several large left ventricular branches with the large distal left ventricular branch containing a proximal 75% stenosis.  Widely patent left main  Mid anterior wall severe hypokinesis.  EF approximately 40%.  Normal LV end-diastolic pressure.  Optimize medical therapy.  Though no ischemia was demonstrated an anterior wall the entire LAD territory could be causing ischemia.  The only interventional approach to the LAD would be a proximal to mid stent to treat the 70% stenosis.  This still leaves behind diffuse severe disease in the mid to distal LAD.  This would be reasonable to approach if symptoms of progressive angina are not controllable.

## 2018-12-02 NOTE — Interval H&P Note (Signed)
Cath Lab Visit (complete for each Cath Lab visit)  Clinical Evaluation Leading to the Procedure:   ACS: No.  Non-ACS:    Anginal Classification: CCS Willis  Anti-ischemic medical therapy: Maximal Therapy (2 or more classes of medications)  Non-Invasive Test Results: Intermediate-risk stress test findings: cardiac mortality 1-3%/year  Prior CABG: No previous CABG      History and Physical Interval Note:  12/02/2018 7:29 AM  [name]  has presented today for surgery, with the diagnosis of Abnormal myoview.  The various methods of treatment have been discussed with the patient and family. After consideration of risks, benefits and other options for treatment, the patient has consented to  Procedure(s): LEFT HEART CATH AND CORS/GRAFTS ANGIOGRAPHY ([name]) as a surgical intervention.  The patient's history has been reviewed, patient examined, no change in status, stable for surgery.  I have reviewed the patient's chart and labs.  Questions were answered to the patient's satisfaction.     Sean Willis

## 2018-12-03 ENCOUNTER — Encounter (HOSPITAL_COMMUNITY): Payer: Self-pay

## 2018-12-03 ENCOUNTER — Emergency Department (HOSPITAL_COMMUNITY)
Admission: EM | Admit: 2018-12-03 | Discharge: 2018-12-04 | Disposition: A | Discharge status: Home / Self Care | Payer: Medicare Other | Attending: Emergency Medicine | Admitting: Emergency Medicine

## 2018-12-03 ENCOUNTER — Emergency Department (HOSPITAL_COMMUNITY): Payer: Medicare Other

## 2018-12-03 ENCOUNTER — Other Ambulatory Visit: Payer: Self-pay

## 2018-12-03 DIAGNOSIS — H538 Other visual disturbances: Secondary | ICD-10-CM | POA: Diagnosis not present

## 2018-12-03 DIAGNOSIS — I252 Old myocardial infarction: Secondary | ICD-10-CM | POA: Diagnosis not present

## 2018-12-03 DIAGNOSIS — Z951 Presence of aortocoronary bypass graft: Secondary | ICD-10-CM | POA: Insufficient documentation

## 2018-12-03 DIAGNOSIS — I4891 Unspecified atrial fibrillation: Secondary | ICD-10-CM | POA: Insufficient documentation

## 2018-12-03 DIAGNOSIS — Z7901 Long term (current) use of anticoagulants: Secondary | ICD-10-CM | POA: Insufficient documentation

## 2018-12-03 DIAGNOSIS — I639 Cerebral infarction, unspecified: Secondary | ICD-10-CM | POA: Diagnosis not present

## 2018-12-03 DIAGNOSIS — R519 Headache, unspecified: Secondary | ICD-10-CM

## 2018-12-03 DIAGNOSIS — I2510 Atherosclerotic heart disease of native coronary artery without angina pectoris: Secondary | ICD-10-CM | POA: Diagnosis not present

## 2018-12-03 DIAGNOSIS — R52 Pain, unspecified: Secondary | ICD-10-CM | POA: Diagnosis not present

## 2018-12-03 DIAGNOSIS — Z7982 Long term (current) use of aspirin: Secondary | ICD-10-CM | POA: Insufficient documentation

## 2018-12-03 DIAGNOSIS — Z79899 Other long term (current) drug therapy: Secondary | ICD-10-CM | POA: Diagnosis not present

## 2018-12-03 DIAGNOSIS — R479 Unspecified speech disturbances: Secondary | ICD-10-CM | POA: Insufficient documentation

## 2018-12-03 DIAGNOSIS — G4489 Other headache syndrome: Secondary | ICD-10-CM | POA: Diagnosis not present

## 2018-12-03 DIAGNOSIS — R29810 Facial weakness: Secondary | ICD-10-CM | POA: Diagnosis not present

## 2018-12-03 DIAGNOSIS — R0902 Hypoxemia: Secondary | ICD-10-CM | POA: Diagnosis not present

## 2018-12-03 LAB — TROPONIN I (HIGH SENSITIVITY)
Troponin I (High Sensitivity): 3 ng/L (ref <18)
Troponin I (High Sensitivity): 3 ng/L (ref <18)

## 2018-12-03 LAB — DIFFERENTIAL
Abs Immature Granulocytes: 0.04 10*3/uL (ref 0.00–0.07)
Basophils Absolute: 0.1 10*3/uL (ref 0.0–0.1)
Basophils Relative: 1 %
Eosinophils Absolute: 0.2 10*3/uL (ref 0.0–0.5)
Eosinophils Relative: 2 %
Immature Granulocytes: 0 %
Lymphocytes Relative: 14 %
Lymphs Abs: 1.3 10*3/uL (ref 0.7–4.0)
Monocytes Absolute: 1 10*3/uL (ref 0.1–1.0)
Monocytes Relative: 10 %
Neutro Abs: 6.9 10*3/uL (ref 1.7–7.7)
Neutrophils Relative %: 73 %

## 2018-12-03 LAB — I-STAT CHEM 8, ED
BUN: 21 mg/dL (ref 8–23)
Calcium, Ion: 1.1 mmol/L — ABNORMAL LOW (ref 1.15–1.40)
Chloride: 105 mmol/L (ref 98–111)
Creatinine, Ser: 1.3 mg/dL — ABNORMAL HIGH (ref 0.61–1.24)
Glucose, Bld: 96 mg/dL (ref 70–99)
HCT: 53 % — ABNORMAL HIGH (ref 39.0–52.0)
Hemoglobin: 18 g/dL — ABNORMAL HIGH (ref 13.0–17.0)
Potassium: 4.4 mmol/L (ref 3.5–5.1)
Sodium: 137 mmol/L (ref 135–145)
TCO2: 27 mmol/L (ref 22–32)

## 2018-12-03 LAB — CBC
HCT: 53.6 % — ABNORMAL HIGH (ref 39.0–52.0)
Hemoglobin: 17.7 g/dL — ABNORMAL HIGH (ref 13.0–17.0)
MCH: 31.5 pg (ref 26.0–34.0)
MCHC: 33 g/dL (ref 30.0–36.0)
MCV: 95.4 fL (ref 80.0–100.0)
Platelets: 167 10*3/uL (ref 150–400)
RBC: 5.62 MIL/uL (ref 4.22–5.81)
RDW: 13 % (ref 11.5–15.5)
WBC: 9.4 10*3/uL (ref 4.0–10.5)
nRBC: 0 % (ref 0.0–0.2)

## 2018-12-03 LAB — COMPREHENSIVE METABOLIC PANEL
ALT: 26 U/L (ref 0–44)
AST: 25 U/L (ref 15–41)
Albumin: 3.9 g/dL (ref 3.5–5.0)
Alkaline Phosphatase: 71 U/L (ref 38–126)
Anion gap: 9 (ref 5–15)
BUN: 18 mg/dL (ref 8–23)
CO2: 25 mmol/L (ref 22–32)
Calcium: 9.4 mg/dL (ref 8.9–10.3)
Chloride: 103 mmol/L (ref 98–111)
Creatinine, Ser: 1.33 mg/dL — ABNORMAL HIGH (ref 0.61–1.24)
GFR calc Af Amer: >60 mL/min (ref >60)
GFR calc non Af Amer: 53 mL/min — ABNORMAL LOW (ref >60)
Glucose, Bld: 101 mg/dL — ABNORMAL HIGH (ref 70–99)
Potassium: 4.6 mmol/L (ref 3.5–5.1)
Sodium: 137 mmol/L (ref 135–145)
Total Bilirubin: 1.1 mg/dL (ref 0.3–1.2)
Total Protein: 7 g/dL (ref 6.5–8.1)

## 2018-12-03 LAB — APTT: aPTT: 41 seconds — ABNORMAL HIGH (ref 24–36)

## 2018-12-03 LAB — PROTIME-INR
INR: 1.4 — ABNORMAL HIGH (ref 0.8–1.2)
Prothrombin Time: 17.3 seconds — ABNORMAL HIGH (ref 11.4–15.2)

## 2018-12-03 LAB — CBG MONITORING, ED: Glucose-Capillary: 95 mg/dL (ref 70–99)

## 2018-12-03 IMAGING — CT CT HEAD W/O CM
4 series · 16 of 47 positions shown, 18 images · non-contrast
Comparison: Brain MRI [DATE]

CLINICAL DATA: Headache, acute, normal neuro exam Possible stroke.
Episodes of garbled speech, blurry vision, and headache. Cardiac
catheterization yesterday.

EXAM:
CT HEAD WITHOUT CONTRAST
TECHNIQUE: Contiguous axial images were obtained from the base of the skull
through the vertex without intravenous contrast.

[Series 2: head wo · axial · 0.44mm/px · z∈[+1274,+1394]mm · 7 of 34 slices shown, 9 images]
[im 5/34  brain]
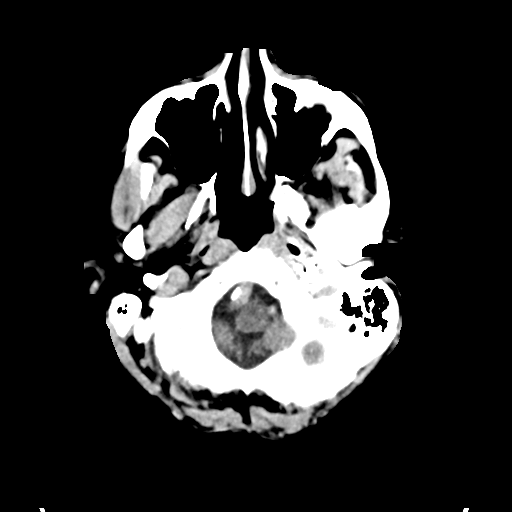
[im 5/34  bone]
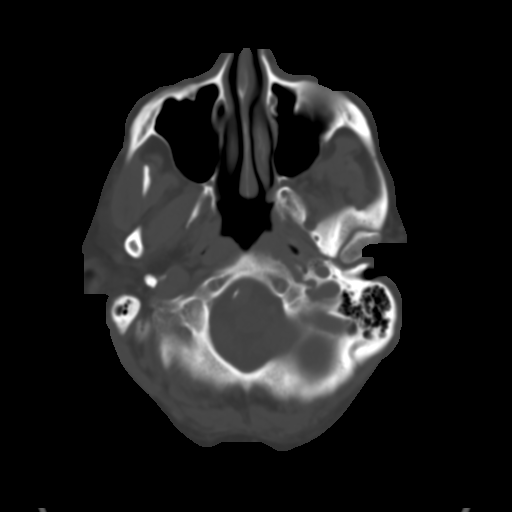
[im 9/34  brain]
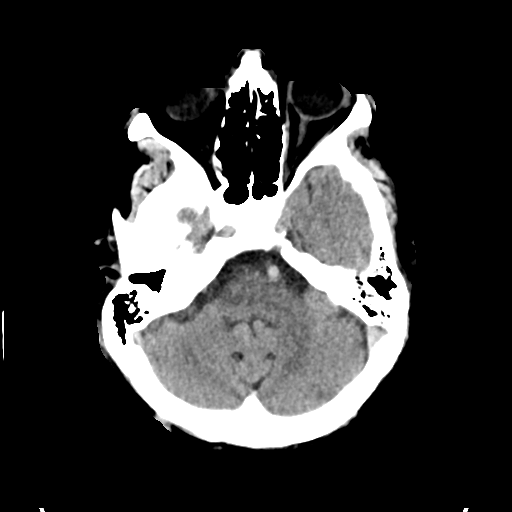
[im 13/34  brain]
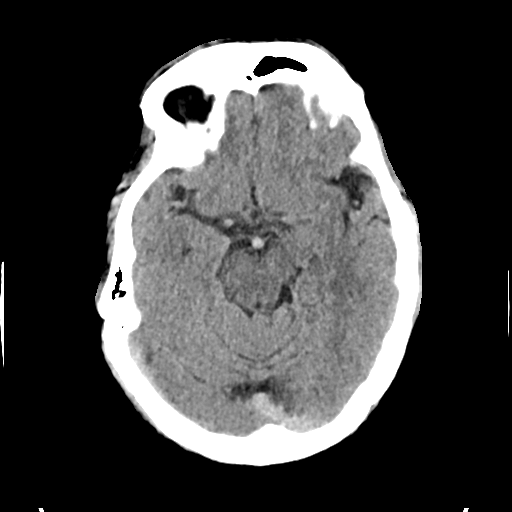
[im 17/34  brain]
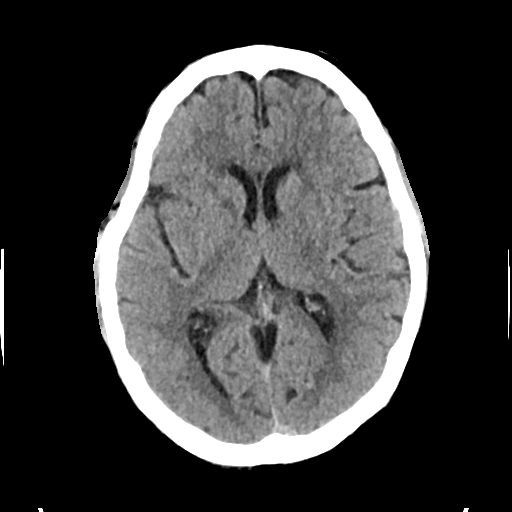
[im 21/34  brain]
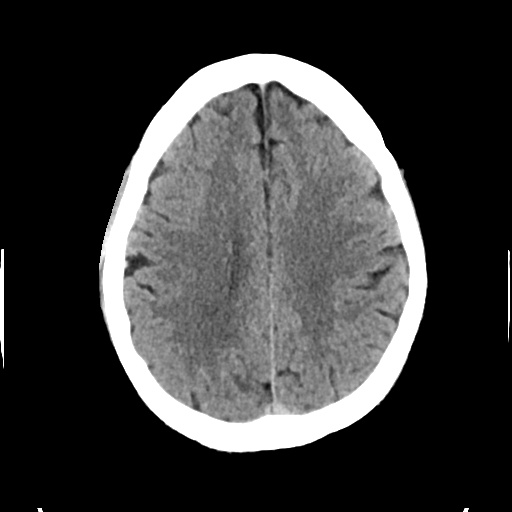
[im 21/34  bone]
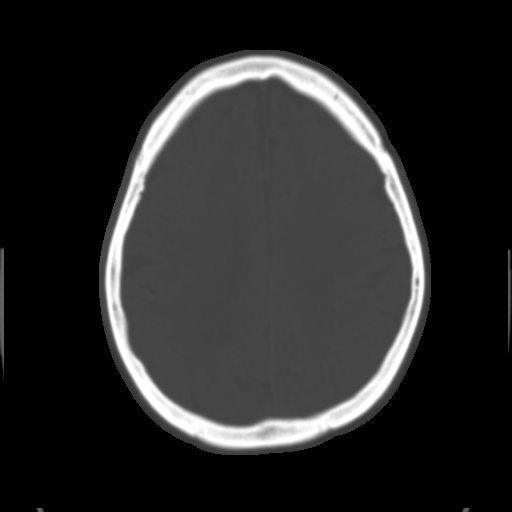
[im 25/34  brain]
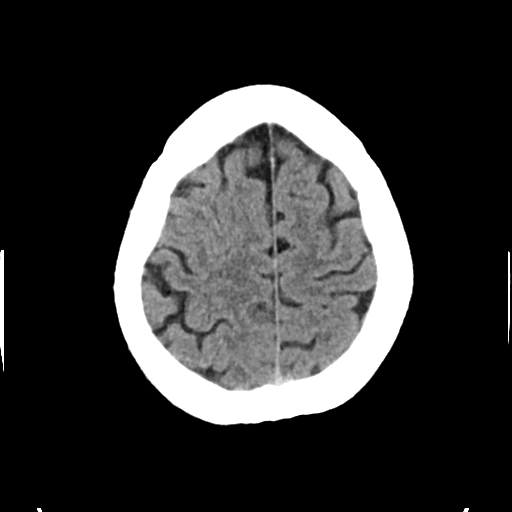
[im 29/34  brain]
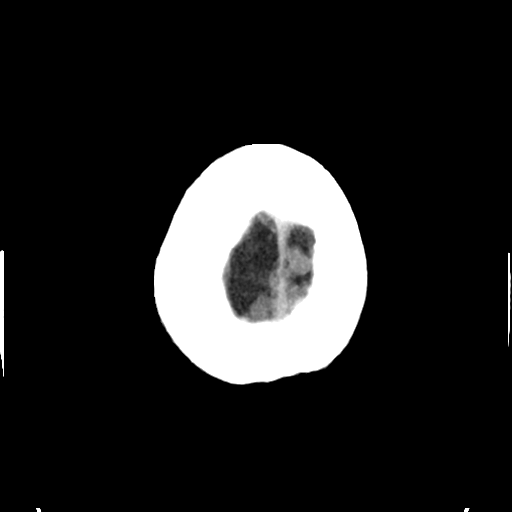

[Series 3: head bone · axial · 0.44mm/px · z∈[+1270,+1304]mm · 3 of 85 slices shown]
[im 9/85  bone]
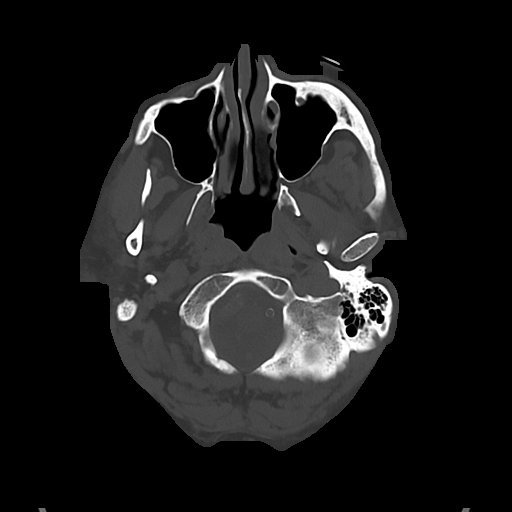
[im 17/85  bone]
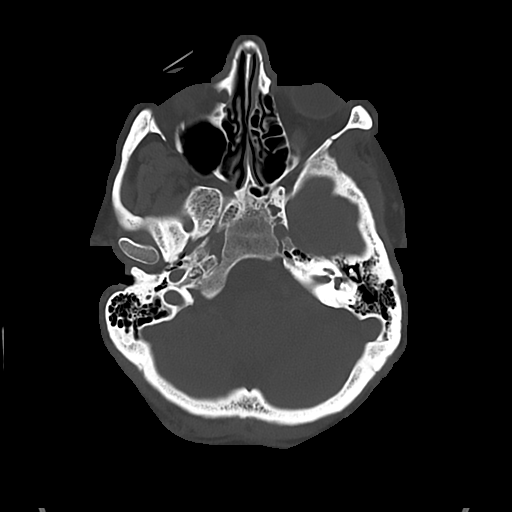
[im 26/85  bone]
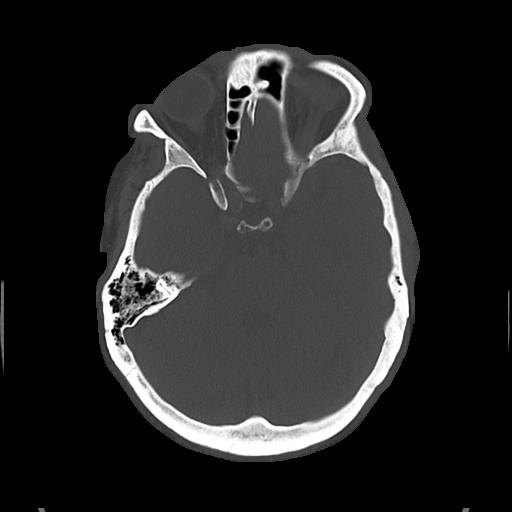

[Series 4: cor soft · coronal · 0.34mm/px · 3 of 75 slices shown]
[im 25/75  brain]
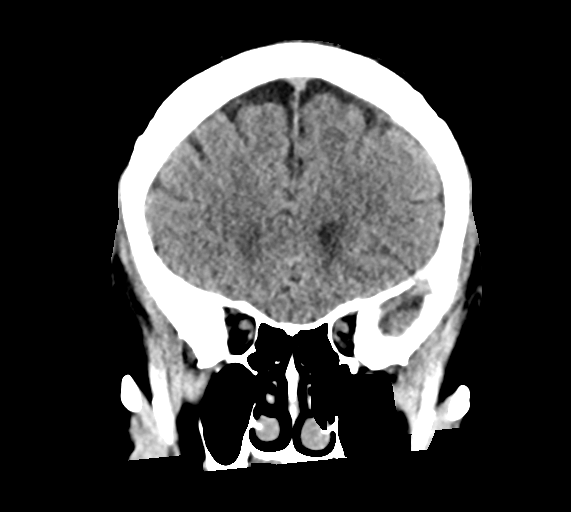
[im 33/75  brain]
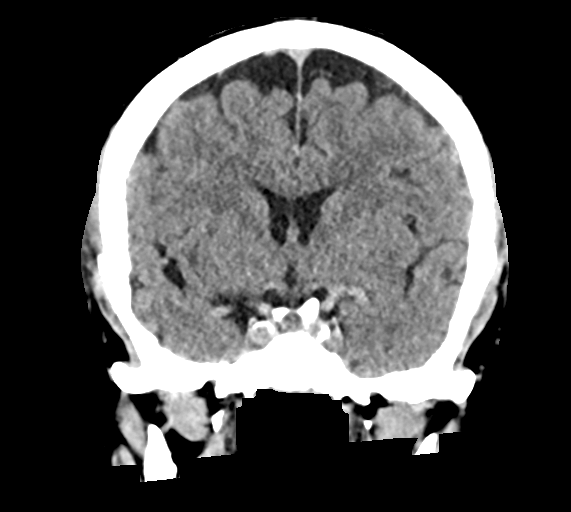
[im 42/75  brain]
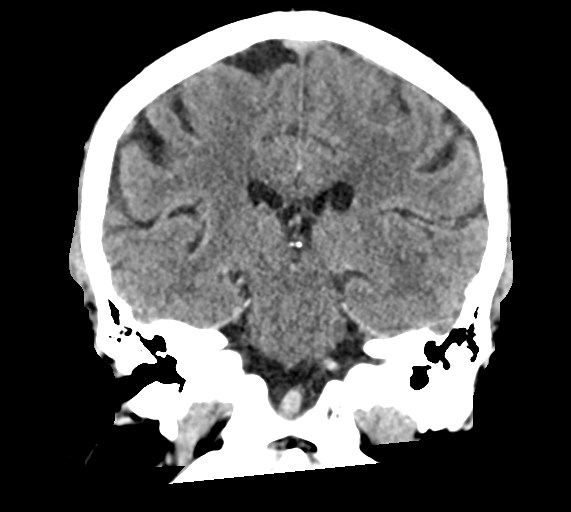

[Series 5: sag soft · sagittal · 0.36mm/px · 3 of 61 slices shown]
[im 21/61  brain]
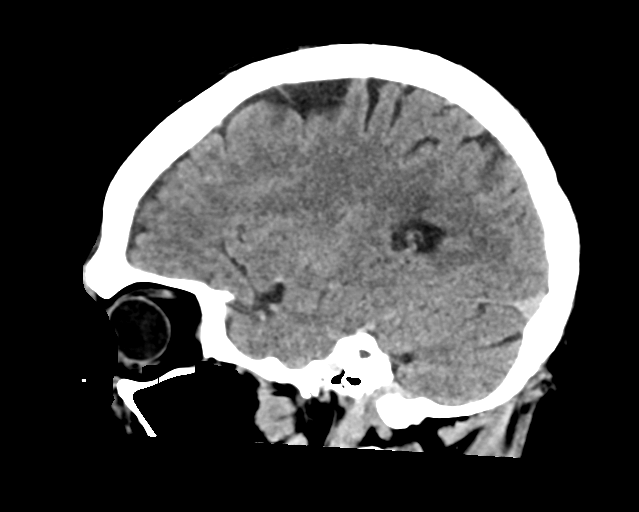
[im 31/61  brain]
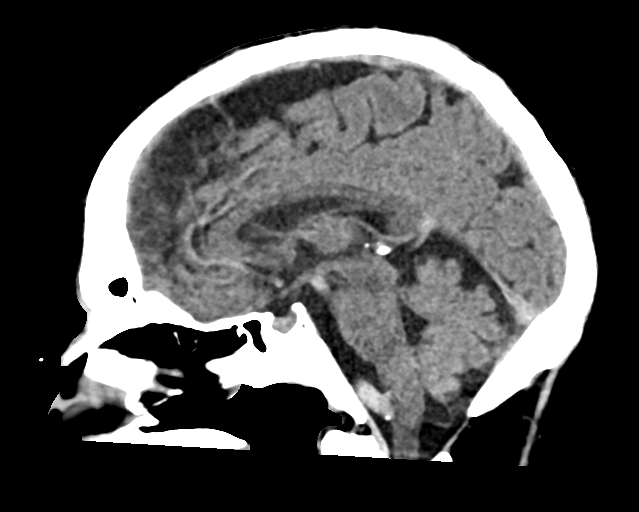
[im 41/61  brain]
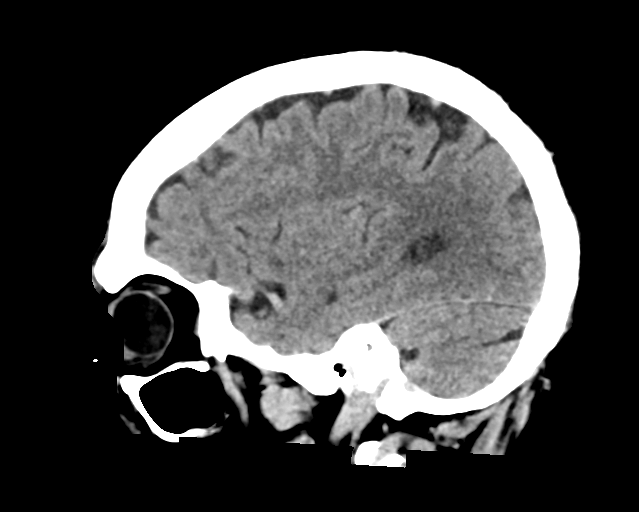

[16 of 47 positions shown; findings below may reference images not displayed]

FINDINGS: Brain: No intracranial hemorrhage, mass effect, or midline shift.
Brain volume is normal for age. No hydrocephalus. The basilar
cisterns are patent. Mild to moderate chronic small vessel ischemia.
No evidence of territorial infarct or acute ischemia. No extra-axial
or intracranial fluid collection.

Vascular: Generalized increased densities in the intravascular
structures likely related to residual IV contrast from cardiac
catheterization yesterday. No focal hyperdense vessel.

Skull: No fracture or focal lesion.

Sinuses/Orbits: Paranasal sinuses and mastoid air cells are clear.
The visualized orbits are unremarkable. Bilateral cataract
resection.

Other: None.
IMPRESSION: 1. No acute intracranial abnormality.
2. Age related atrophy. Mild to moderate chronic small vessel
ischemia.

## 2018-12-03 IMAGING — MR MR HEAD W/O CM
12 of 13 series · 43 of 48 positions shown · non-contrast
Comparison: Head CT earlier today. Brain MRI [DATE].

CLINICAL DATA: 73-year-old male status post cardiac angiogram
yesterday with new recurrent, transient garbled speech, blurred
vision and headache

EXAM:
MRI HEAD WITHOUT CONTRAST
TECHNIQUE: Multiplanar, multiecho pulse sequences of the brain and surrounding
structures were obtained without intravenous contrast.

[Series 9: DWI · axial · 3.0mm · 0.88mm/px · z∈[-155,-15]mm · 6 of 96 slices shown (1 of 4)]
[im 1/96]
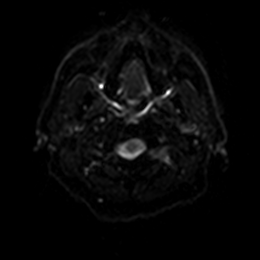
[im 20/96]
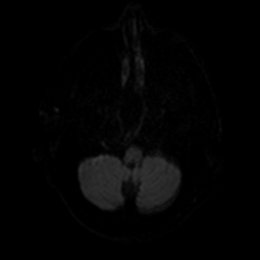
[im 39/96]
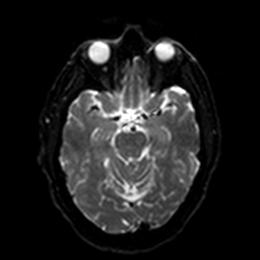
[im 58/96]
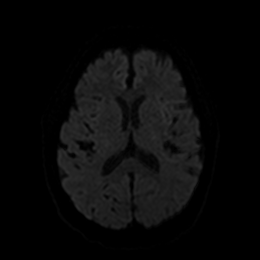
[im 77/96]
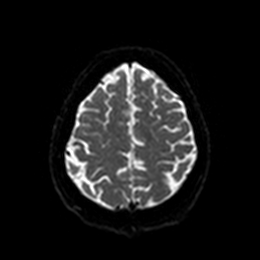
[im 96/96]
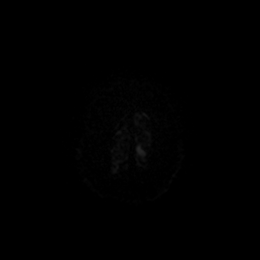

[Series 10: DWI · axial · 3.0mm · 0.88mm/px · z∈[-155,-15]mm · 3 of 48 slices shown (2 of 4)]
[im 1/48]
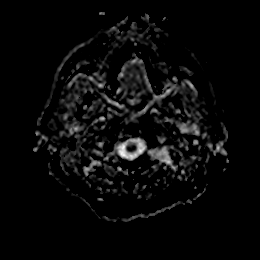
[im 24/48]
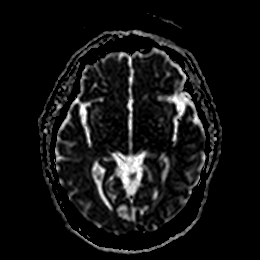
[im 48/48]
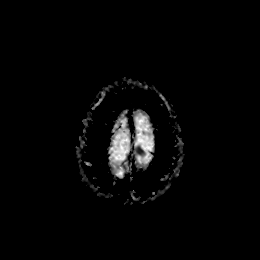

[Series 11: DWI · coronal · 4.0mm · 0.88mm/px · 5 of 68 slices shown (3 of 4)]
[im 1/68]
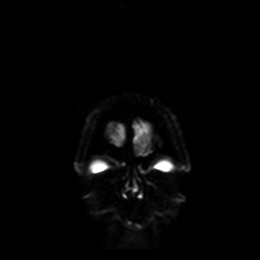
[im 17/68]
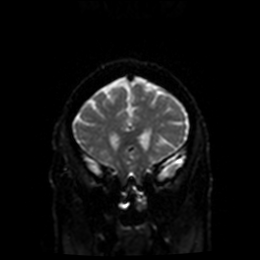
[im 34/68]
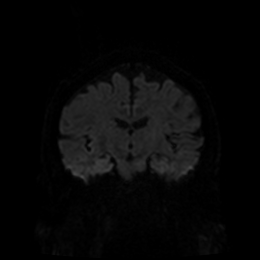
[im 51/68]
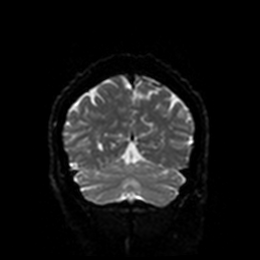
[im 68/68]
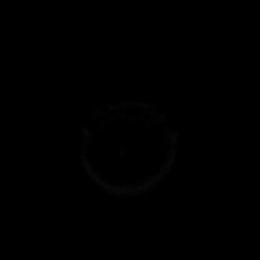

[Series 12: DWI · coronal · 4.0mm · 0.88mm/px · 3 of 34 slices shown (4 of 4)]
[im 1/34]
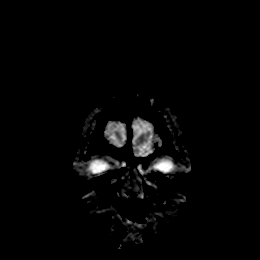
[im 17/34]
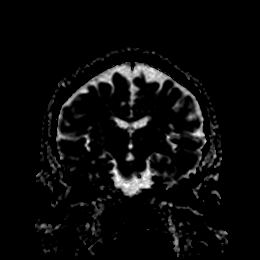
[im 34/34]
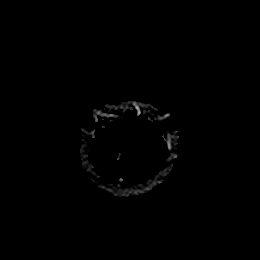

[Series 13: T1 · sagittal · 5.0mm · 0.75mm/px · 2 of 23 slices shown]
[im 1/23]
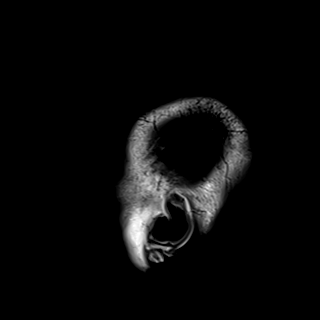
[im 23/23]
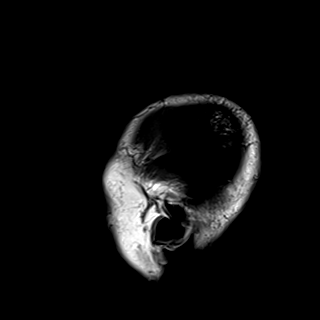

[Series 14: T2 · axial · 5.0mm · 0.72mm/px · z∈[-164,-23]mm · 2 of 25 slices shown (1 of 2)]
[im 1/25]
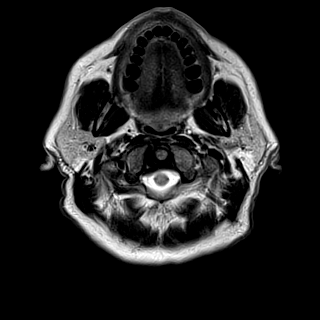
[im 25/25]
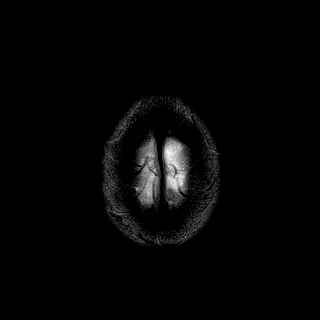

[Series 15: FLAIR · axial · 5.0mm · 0.45mm/px · z∈[-165,-24]mm · 2 of 25 slices shown]
[im 1/25]
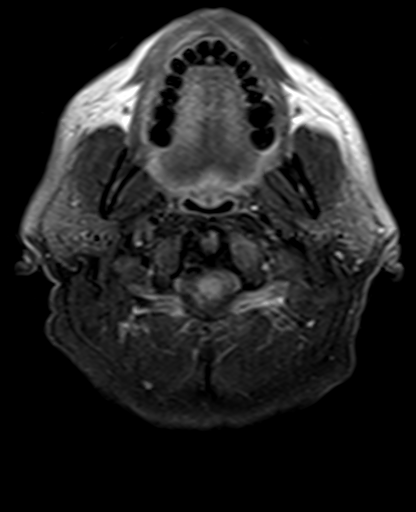
[im 25/25]
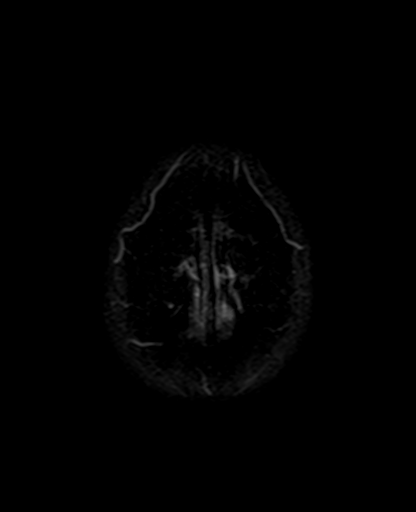

[Series 16: mag_images · axial · 3.0mm · 0.90mm/px · z∈[-173,+0]mm · 5 of 60 slices shown]
[im 1/60]
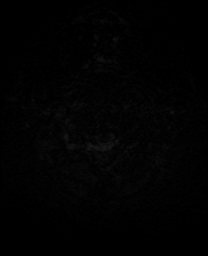
[im 15/60]
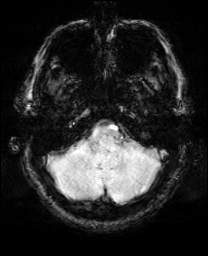
[im 30/60]
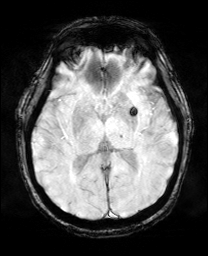
[im 45/60]
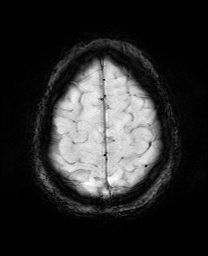
[im 60/60]
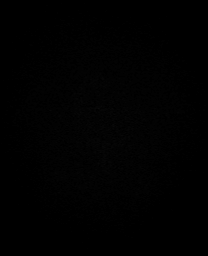

[Series 17: pha_images · axial · 3.0mm · 0.90mm/px · z∈[-173,+0]mm · 4 of 57 slices shown]
[im 1/57]
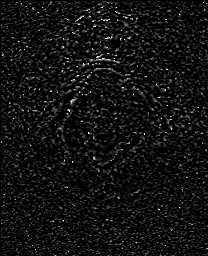
[im 19/57]
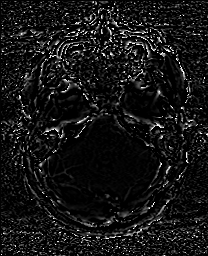
[im 38/57]
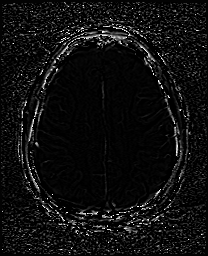
[im 57/57]
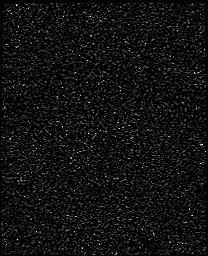

[Series 18: swi_images · axial · 3.0mm · 0.90mm/px · z∈[-173,+0]mm · 5 of 60 slices shown]
[im 1/60]
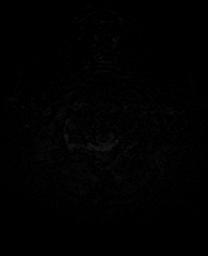
[im 15/60]
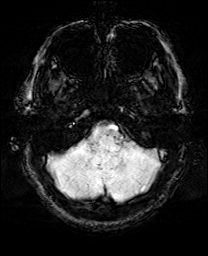
[im 30/60]
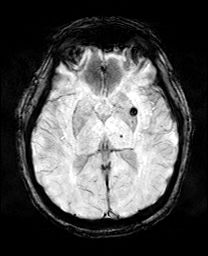
[im 45/60]
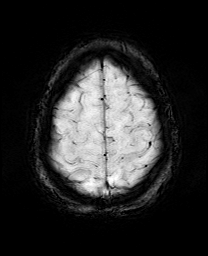
[im 60/60]
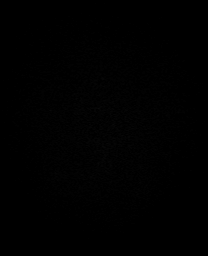

[Series 19: mip_images(sw) · axial · 24.0mm · 0.90mm/px · z∈[-163,-10]mm · 4 of 53 slices shown]
[im 1/53]
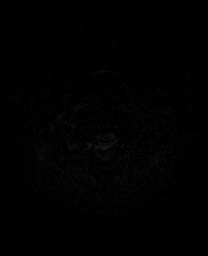
[im 18/53]
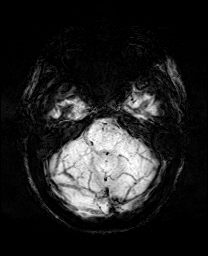
[im 35/53]
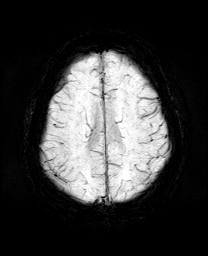
[im 53/53]
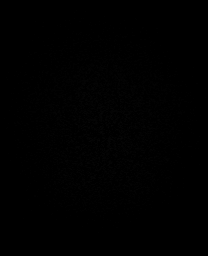

[Series 21: T2 · coronal · 5.0mm · 0.34mm/px · 2 of 29 slices shown (2 of 2)]
[im 1/29]
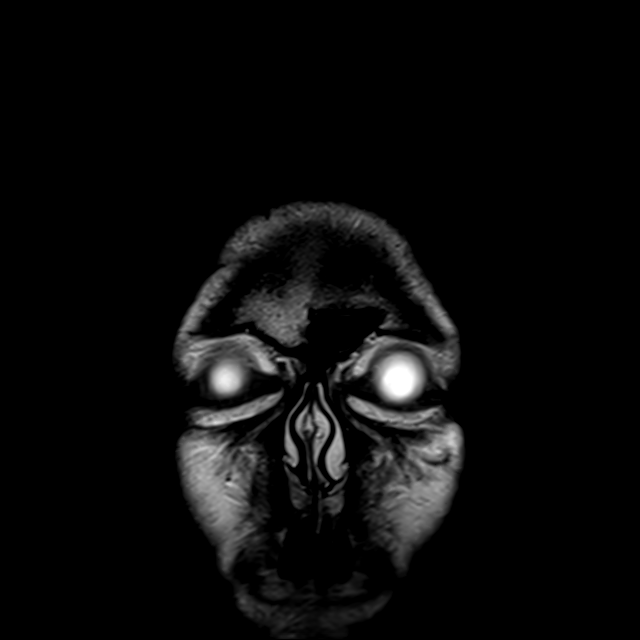
[im 29/29]
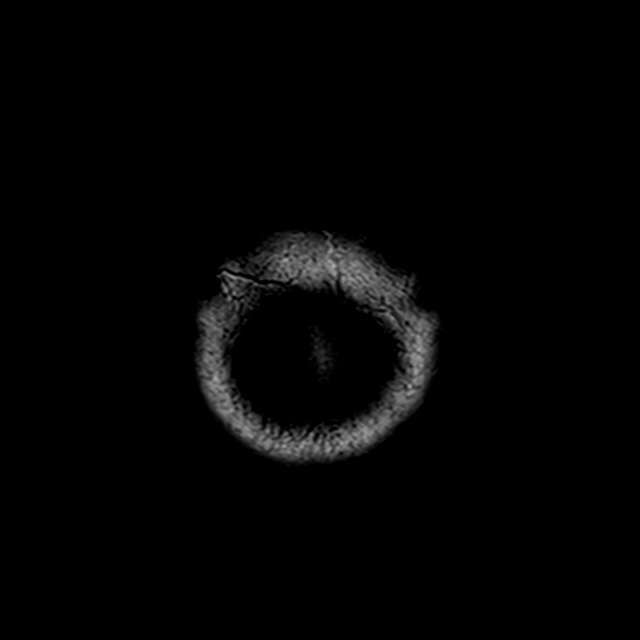

[43 of 48 positions shown; findings below may reference images not displayed]

FINDINGS: Brain: There is a small round cortical infarct in the anterior left
superior frontal gyrus best seen on series 11, image 61. Possible
subtle contralateral right anterior frontal lobe cortical infarct on
series 9, image 84. No other restricted diffusion identified.

Multiple chronic microhemorrhages in the left deep gray nuclei, that
on series 18, image 33 is new since [REDACTED]. Stable background
bilateral basal ganglia T2 heterogeneity may indicate a combination
of perivascular spaces and small chronic lacunar infarcts. Patchy
bilateral cerebral white matter T2 and FLAIR hyperintensity. No
cortical encephalomalacia identified.

No midline shift, mass effect, evidence of mass lesion,
ventriculomegaly, extra-axial collection or acute intracranial
hemorrhage. Cervicomedullary junction and pituitary are within
normal limits.

Vascular: Major intracranial vascular flow voids are stable.
Generalized intracranial artery dolichoectasia.

Skull and upper cervical spine: Negative visible cervical spine.
Visualized bone marrow signal is within normal limits.

Sinuses/Orbits: Stable and negative.

Other: Mastoids remain clear. Visible internal auditory structures
appear normal.
IMPRESSION: 1. Small acute cortical infarct in the anterior left superior
frontal gyrus, and possibly a 2nd subtle infarct in the
contralateral right frontal lobe. No associated hemorrhage or mass
effect.
2. Underlying chronic small vessel disease with mild progression
since a [DATE]. Generalized intracranial artery dolichoectasia.

## 2018-12-03 MED ORDER — SODIUM CHLORIDE 0.9% FLUSH
3.0000 mL | Freq: Once | INTRAVENOUS | Status: DC
Start: 2018-12-03 — End: 2018-12-04

## 2018-12-03 MED ORDER — DIPHENHYDRAMINE HCL 50 MG/ML IJ SOLN
25.0000 mg | Freq: Once | INTRAMUSCULAR | Status: AC
  Administered 2018-12-03: 25 mg via INTRAVENOUS
  Filled 2018-12-03: qty 1

## 2018-12-03 MED ORDER — METOCLOPRAMIDE HCL 5 MG/ML IJ SOLN
10.0000 mg | Freq: Once | INTRAMUSCULAR | Status: AC
  Administered 2018-12-03: 10 mg via INTRAVENOUS
  Filled 2018-12-03: qty 2

## 2018-12-03 NOTE — ED Notes (Signed)
Patient transported to MRI 

## 2018-12-03 NOTE — ED Provider Notes (Signed)
11:55 PM Assumed care from Dr. Darl Householder, please see their note for full history, physical and decision making until this point. In brief this is a 73 y.o. year old male who presented to the ED tonight with Headache     Patient here with 3 episodes of headache, slurred speech and blurry vision since his cardiac cath yesterday.  Felt to be likely complex migraines with pending MRI to evaluate for stroke.  MRI consistent with acute infarcts.  Discussed with neurology.  They will see and decide disposition.  Neurology felt it is related to catheterization. Has had all appropriate stroke studies in last couple weeks. Is on xarelto. Feels he is safe for discharge with close neuro follow up.   Discharge instructions, including strict return precautions for new or worsening symptoms, given. Patient and/or family verbalized understanding and agreement with the plan as described.   Labs, studies and imaging reviewed by myself and considered in medical decision making if ordered. Imaging interpreted by radiology.  Labs Reviewed  PROTIME-INR - Abnormal; Notable for the following components:      Result Value   Prothrombin Time 17.3 (*)    INR 1.4 (*)    All other components within normal limits  APTT - Abnormal; Notable for the following components:   aPTT 41 (*)    All other components within normal limits  CBC - Abnormal; Notable for the following components:   Hemoglobin 17.7 (*)    HCT 53.6 (*)    All other components within normal limits  COMPREHENSIVE METABOLIC PANEL - Abnormal; Notable for the following components:   Glucose, Bld 101 (*)    Creatinine, Ser 1.33 (*)    GFR calc non Af Amer 53 (*)    All other components within normal limits  I-STAT CHEM 8, ED - Abnormal; Notable for the following components:   Creatinine, Ser 1.30 (*)    Calcium, Ion 1.10 (*)    Hemoglobin 18.0 (*)    HCT 53.0 (*)    All other components within normal limits  DIFFERENTIAL  CBG MONITORING, ED  TROPONIN I  (HIGH SENSITIVITY)  TROPONIN I (HIGH SENSITIVITY)    MR BRAIN WO CONTRAST  Final Result    CT HEAD WO CONTRAST  Final Result      No follow-ups on file.    Merrily Pew, MD 12/04/18 [phone]

## 2018-12-03 NOTE — ED Notes (Signed)
Pt given diet Coke, crackers and cheese.

## 2018-12-03 NOTE — ED Provider Notes (Signed)
[facility] EMERGENCY DEPARTMENT Provider Note   CSN: [id number] Arrival date & time: 12/03/18  1446     History   Chief Complaint Chief Complaint  Patient presents with  . Headache    HPI Sean Willis is a 73 y.o. male hx of CAD s/p CABG, here presenting with headache, trouble speaking, blurry vision.  Patient just had a heart catheterization yesterday and apparently was a diagnostic cath with no additional stents.  States that shortly after the procedure, he had an episode of headache and episode of blurry vision and some speech difficulty that resolved on its own.  he was discharged home and had another episode yesterday.  This morning, around 11:45 AM he had similar episodes and was driving here and by the time he got here, he states that the slurred speech has resolved.  He denies any focal weakness.  No code stroke was activated in triage since his symptoms were resolved.      The history is provided by the patient.    Past Medical History:  Diagnosis Date  . Benign essential HTN 11/24/2013  . BPH (benign prostatic hyperplasia)   . CAD (coronary artery disease) 01/23/2013  . CAD (coronary artery disease), native coronary artery 04/2011   S/P CABG X4 [place]  . Carotid arterial disease ([place]) 11/24/2014  . Carotid artery stenosis    1-39% bilateral by dopplers 2018  . Chest wall pain    Right side, muscular,ortho,MRI. U/S abd ok. On lyrica  . Chronic insomnia   . Colon polyp 10/11  . Coronary artery disease 04/2011   s/p CABG in setting of MI in [place], [place], with initial PTCA of OM2 then CABG with LIMA to LAD, SVG to D1, SVG to OM1,/R1 and SVG to distal RCA  . DDD (degenerative disc disease), lumbar   . Dilatation of aorta (HCC)    4.5 cm ascending aortic aneurysm per chets ct 05-18-15 epic  . Dysrhythmia   . ED (erectile dysfunction)   . GERD (gastroesophageal reflux disease)   . Gout   . Gout   . History of kidney stones    . Hypertension    since mi in 2013 no htn  . Hypertriglyceridemia   . Kidney stone    uric acid, Dr [name]  renal insufficiency  . Mitral regurgitation 06/04/2016   Moderate by echo 2017  . Mixed hyperlipidemia 01/23/2013  . Myocardial infarction ([place])    2013  . OSA on CPAP 07/16/2016  . Osteoarthritis    of the knee  . Permanent atrial fibrillation ([place])    s/p DCCV 01/2016.  CHASDS2VASC score is 3  . Sleep apnea     Patient Active Problem List   Diagnosis Date Noted  . Abnormal nuclear cardiac imaging test 12/01/2018  . Aortic root dilatation ([place]) 08/13/2017  . OSA on CPAP 07/16/2016  . Mitral regurgitation 06/04/2016  . Persistent atrial fibrillation ([place])   . Carotid arterial disease ([place]) 11/24/2014  . Benign essential HTN 11/24/2013  . Dilatation of aorta (HCC)   . CAD (coronary artery disease) 01/23/2013  . Mixed hyperlipidemia 01/23/2013    Past Surgical History:  Procedure Laterality Date  . CARDIOVERSION [name] 01/20/2016   Procedure: CARDIOVERSION;  Surgeon: Thayer Headings, MD;  Location: [facility];  Service: Cardiovascular;  Laterality: [name];  . CARDIOVERSION [name] 03/23/2016   Procedure: CARDIOVERSION;  Surgeon: Sueanne Margarita, MD;  Location: [facility];  Service: Cardiovascular;  Laterality: [name];  .  CARDIOVERSION    . CATARACT EXTRACTION W/PHACO Right 10/26/2014   Procedure: CATARACT EXTRACTION PHACO AND INTRAOCULAR LENS PLACEMENT (IOC);  Surgeon: [name], MD;  Location: [facility];  Service: Ophthalmology;  Laterality: Right;  LOT PACK: [id number] H US:01:43.0 AP:27.8 CDE:28.68  . CATARACT EXTRACTION W/PHACO Left 11/13/2016   Procedure: CATARACT EXTRACTION PHACO AND INTRAOCULAR LENS PLACEMENT (IOC);  Surgeon: [name], MD;  Location: [facility];  Service: Ophthalmology;  Laterality: Left;  [place] 01:00.2 AP% 15.7 CDE 9.47 Fluid Pack lot # [id number]  . COLONOSCOPY WITH PROPOFOL [name] 09/27/2015   Procedure: COLONOSCOPY WITH PROPOFOL;  Surgeon: [name], MD;  Location: [facility];  Service: Endoscopy;  Laterality: [name];  . CORONARY ARTERY BYPASS GRAFT  04/2011   Severe multivessel ASCAD s/p Stemi w PTCA of OM2 then s/p CABG w LIMA to LAD,SVG to OM1 /RI and SVG to Distal RCA   . FINGER SURGERY     left finger for ganglion cyst  . KIDNEY STONE SURGERY    . LEFT HEART CATH AND CORS/GRAFTS ANGIOGRAPHY [name] 12/02/2018   Procedure: LEFT HEART CATH AND CORS/GRAFTS ANGIOGRAPHY;  Surgeon: Belva Crome, MD;  Location: [facility];  Service: Cardiovascular;  Laterality: [name];        Home Medications    Prior to Admission medications   Medication Sig Start Date End Date Taking? Authorizing Provider  acetaminophen (TYLENOL) 500 MG tablet Take 1,000 mg by mouth every 8 (eight) hours as needed for moderate pain or headache.    [provider]  allopurinol (ZYLOPRIM) 100 MG tablet Take 100 mg by mouth daily as needed (gout).  07/03/18   [provider]  aspirin EC 81 MG tablet Take 162 mg by mouth daily as needed (chest pain).    [provider]  Coenzyme Q10 (COQ-10 PO) Take 1 tablet by mouth daily.     [provider]  doxepin (SINEQUAN) 25 MG capsule Take 25 mg by mouth at bedtime.     [provider]  escitalopram (LEXAPRO) 20 MG tablet Take 10 mg by mouth at bedtime.    [provider]  GINKGO BILOBA PO Take 1 capsule by mouth daily.    [provider]  Homeopathic Products (EARACHE DROPS OT) Place 1-2 drops 3 (three) times daily as needed in ear(s) (for ear pain.).    [provider]  LYSINE PO Take 1 tablet by mouth daily as needed (fever blisters).    [provider]  metoprolol tartrate (LOPRESSOR) 50 MG tablet Take 1 tablet (50 mg total) by mouth 2 (two) times daily. 11/12/18   Sueanne Margarita, MD  mometasone (NASONEX) 50 MCG/ACT nasal spray Place 2 sprays into the nose daily as needed (allergies).  08/14/16   [provider]  pravastatin  (PRAVACHOL) 40 MG tablet Take 40 mg by mouth 2 (two) times daily.    [provider]  pravastatin (PRAVACHOL) 80 MG tablet Take 1 tablet (80 mg total) by mouth every evening. Patient not taking: Reported on 11/24/2018 11/11/18   Sueanne Margarita, MD  sacubitril-valsartan (ENTRESTO) 24-26 MG Take 1 tablet by mouth 2 (two) times daily. 11/20/18   Sueanne Margarita, MD  spironolactone (ALDACTONE) 25 MG tablet Take 0.5 tablets (12.5 mg total) by mouth daily. 11/20/18   Sueanne Margarita, MD  vitamin B-12 (CYANOCOBALAMIN) 1000 MCG tablet Take 1,000 mcg by mouth daily.    [provider]  XARELTO 20 MG TABS tablet TAKE 1 TABLET (20  MG TOTAL) BY MOUTH DAILY WITH SUPPER. 11/26/18   Turner, Eber Hong, MD  zolpidem (AMBIEN) 5 MG tablet Take 2.5-5 mg by mouth at bedtime as needed for sleep.  10/19/16   [provider]    Family History Family History  Problem Relation Age of Onset  . Alzheimer's disease Mother   . Cancer Mother   . CVA Father   . Arrhythmia Father   . Arrhythmia Sister     Social History Social History   Tobacco Use  . Smoking status: Never Smoker  . Smokeless tobacco: Never Used  Substance Use Topics  . Alcohol use: Yes    Comment: yes occassionally  . Drug use: No     Allergies   Celebrex [celecoxib]   Review of Systems Review of Systems  Neurological: Positive for headaches.  All other systems reviewed and are negative.    Physical Exam Updated Vital Signs BP (!) 144/125   Pulse 81   Temp 98 F (36.7 C) (Oral)   Resp 19   SpO2 98%   Physical Exam Vitals signs and nursing note reviewed.  Constitutional:      Appearance: He is well-developed.  HENT:     Head: Normocephalic.  Eyes:     Extraocular Movements: Extraocular movements intact.  Neck:     Musculoskeletal: Normal range of motion and neck supple.  Cardiovascular:     Rate and Rhythm: Normal rate and regular rhythm.  Pulmonary:     Effort: Pulmonary effort is normal.      Breath sounds: Normal breath sounds.  Abdominal:     General: Bowel sounds are normal.     Palpations: Abdomen is soft.  Musculoskeletal: Normal range of motion.  Skin:    General: Skin is warm.  Neurological:     Mental Status: He is alert and oriented to person, place, and time.     Comments: CN 2 - 12 intact. Nl strength throughout, nl sensation throughout, nl finger to nose bilaterally   Psychiatric:        Mood and Affect: Mood normal.        Behavior: Behavior normal.      ED Treatments / Results  Labs (all labs ordered are listed, but only abnormal results are displayed) Labs Reviewed  PROTIME-INR - Abnormal; Notable for the following components:      Result Value   Prothrombin Time 17.3 (*)    INR 1.4 (*)    All other components within normal limits  APTT - Abnormal; Notable for the following components:   aPTT 41 (*)    All other components within normal limits  CBC - Abnormal; Notable for the following components:   Hemoglobin 17.7 (*)    HCT 53.6 (*)    All other components within normal limits  COMPREHENSIVE METABOLIC PANEL - Abnormal; Notable for the following components:   Glucose, Bld 101 (*)    Creatinine, Ser 1.33 (*)    GFR calc non Af Amer 53 (*)    All other components within normal limits  I-STAT CHEM 8, ED - Abnormal; Notable for the following components:   Creatinine, Ser 1.30 (*)    Calcium, Ion 1.10 (*)    Hemoglobin 18.0 (*)    HCT 53.0 (*)    All other components within normal limits  DIFFERENTIAL  CBG MONITORING, ED  TROPONIN I (HIGH SENSITIVITY)    EKG EKG Interpretation  Date/Time:  Wednesday December 03 2018 15:01:53 EST Ventricular Rate:  85  PR Interval:    QRS Duration: 90 QT Interval:  376 QTC Calculation: 447 R Axis:   40 Text Interpretation: Atrial fibrillation with a competing junctional pacemaker Abnormal ECG No significant change since last tracing Confirmed by Wandra Arthurs [phone]) on 12/03/2018 4:58:54 PM    Radiology No results found.  Procedures Procedures (including critical care time)  Medications Ordered in ED Medications  sodium chloride flush (NS) 0.9 % injection 3 mL (has no administration in time range)  metoCLOPramide (REGLAN) injection 10 mg (has no administration in time range)  diphenhydrAMINE (BENADRYL) injection 25 mg (has no administration in time range)     Initial Impression / Assessment and Plan / ED Course  I have reviewed the triage vital signs and the nursing notes.  Pertinent labs & imaging results that were available during my care of the patient were reviewed by me and considered in my medical decision making (see chart for details).       Sean Willis is a 73 y.o. male here with slurred speech, headache after cath. Likely complex migraine vs side effect from anesthesia vs small stroke. He didn't get any lytics so I doubt hemorrhagic stroke. Will get labs, CT head, MRI brain. Will give migraine cocktail   11:24 PM MRI pending. Anticipate dc home if he has no stroke. Signed out to Dr. Dolly Rias    Final Clinical Impressions(s) / ED Diagnoses   Final diagnoses:  None    ED Discharge Orders    None       Drenda Freeze, MD 12/03/18 2324

## 2018-12-03 NOTE — ED Triage Notes (Signed)
Pt had heart cath yesterday, while in recovery room started getting garbled speech, blurred vision, and headache.  Lasted 30-45 min, pt was discharged.  Same symptoms happened while at home @ 4pm, lasted 1 hour.  Onset 11:45 am today same symptoms.  While en route with EMS at 1:50 symptoms resolved except the headache.   Smile symmetrical, hand grips equal, speech clear.   Called Dr. Langston Masker to evalate pt in triage.    SonMerry Proud [phone]

## 2018-12-03 NOTE — ED Notes (Signed)
Patient transported to CT 

## 2018-12-04 ENCOUNTER — Emergency Department (HOSPITAL_COMMUNITY): Payer: Medicare Other

## 2018-12-04 DIAGNOSIS — R519 Headache, unspecified: Secondary | ICD-10-CM

## 2018-12-04 IMAGING — CT CT ANGIO NECK
2 of 7 series · 8 of 33 positions shown · IV contrast (OMNI 350)
Comparison: Brain MRI [DATE].

CLINICAL DATA: 73-year-old male status post cardiac angiogram
yesterday with new recurrent, transient garbled speech, blurred
vision and headache. One or two small acute cortical infarcts in the
anterior frontal lobes on MRI earlier tonight.

EXAM:
CT ANGIOGRAPHY HEAD AND NECK
TECHNIQUE: Multidetector CT imaging of the head and neck was performed using
the standard protocol during bolus administration of intravenous
contrast. Multiplanar CT image reconstructions and MIPs were
obtained to evaluate the vascular anatomy. Carotid stenosis
measurements (when applicable) are obtained utilizing NASCET
criteria, using the distal internal carotid diameter as the
denominator.
CONTRAST:  75mL OMNIPAQUE IOHEXOL 350 MG/ML SOLN

[Series 5: cta neck · axial · 0.41mm/px · z∈[-130,-18]mm · 2 of 169 slices shown]
[im 57/169  soft-tissue]
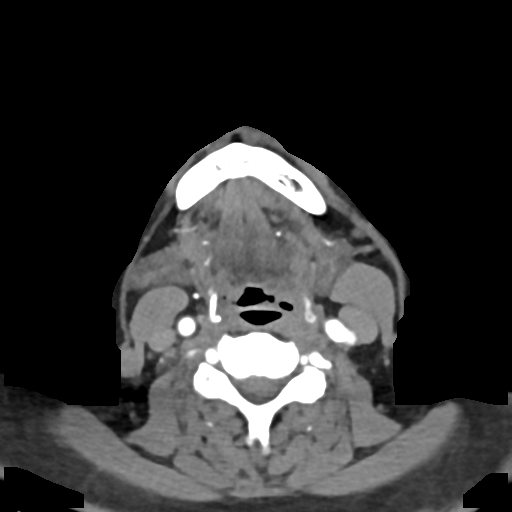
[im 113/169  soft-tissue]
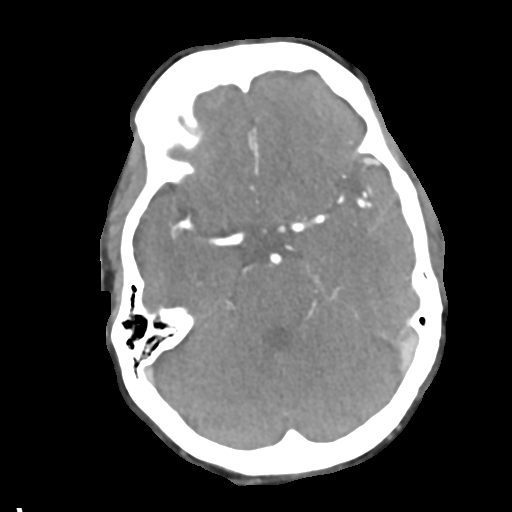

[Series 7: cta neck axial · axial · 0.39mm/px · z∈[-194,+46]mm · 6 of 337 slices shown]
[im 49/337  soft-tissue]
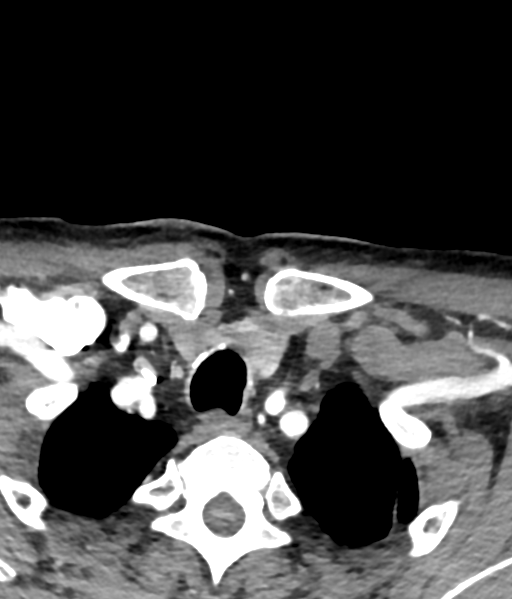
[im 97/337  bone]
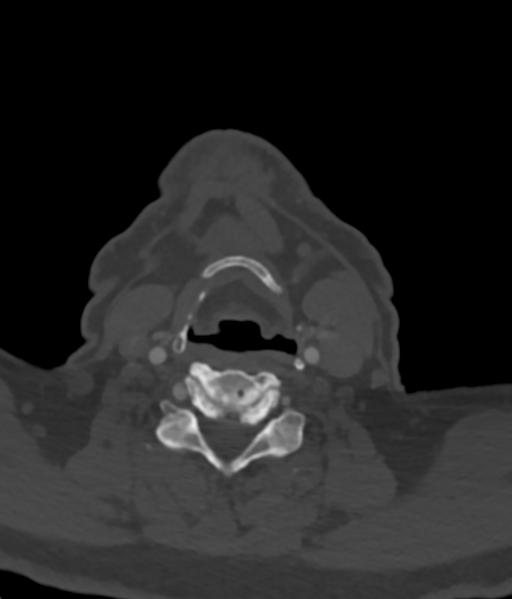
[im 145/337  soft-tissue]
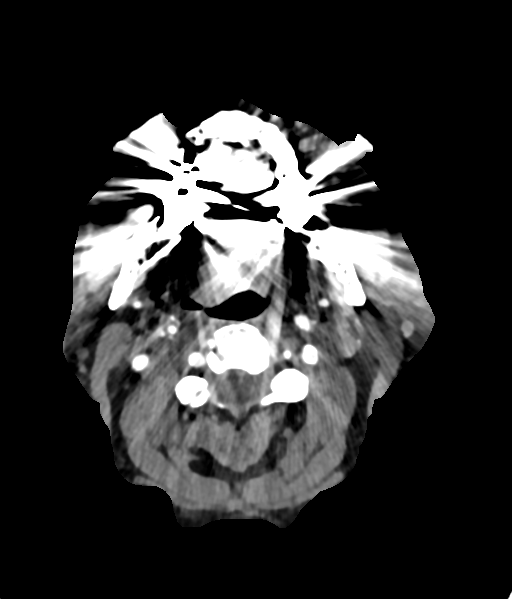
[im 193/337  bone]
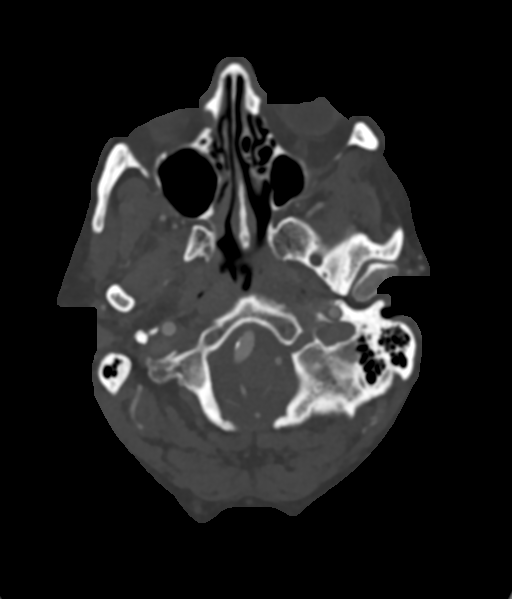
[im 241/337  soft-tissue]
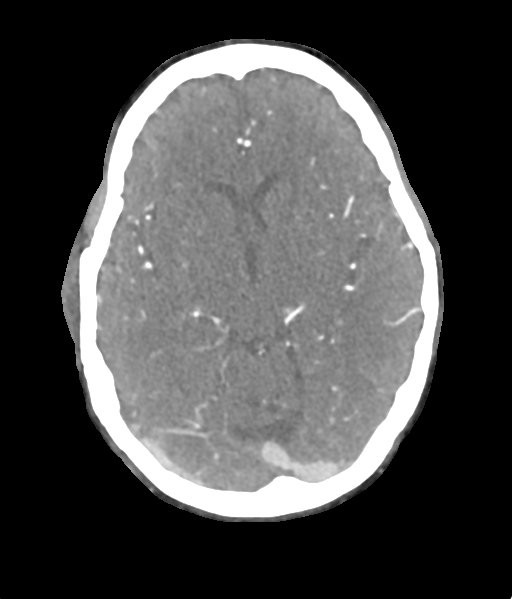
[im 289/337  bone]
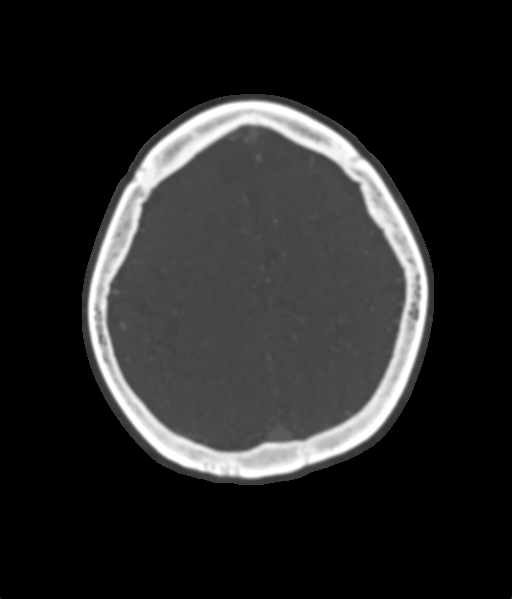

[8 of 33 positions shown; findings below may reference images not displayed]

FINDINGS: CTA NECK

Skeleton: Prior sternotomy. Widespread cervical spine degeneration.
Subtle degenerative appearing anterolisthesis at the cervicothoracic
junction. No acute osseous abnormality identified.

Upper chest: Prior CABG. Otherwise negative.

Other neck: Negative.

Aortic arch: 4 vessel arch configuration, the left vertebral arises
directly from the arch. Minimal arch atherosclerosis.

Right carotid system: Tortuous brachiocephalic artery with a mildly
kinked appearance. Negative right CCA origin. Mild soft plaque at
the posterior right ICA origin without stenosis.

Left carotid system: Tortuous proximal left CCA with minimal plaque
and no stenosis. Negative left carotid bifurcation, no cervical left
ICA stenosis.

Vertebral arteries:
No proximal right subclavian artery stenosis despite mild plaque.
Normal right vertebral artery origin. Dominant right vertebral
artery is patent and mildly ectatic to the skull base.

Left vertebral artery arises directly from the arch and is non
dominant. Patent left vertebral to the skull base without stenosis.

CTA HEAD

Posterior circulation: Right greater than left vertebral artery V4
segment calcified plaque without hemodynamically significant
stenosis. Dominant right vertebral. Patent PICA origins and
vertebrobasilar junction. Ectatic basilar artery is patent without
stenosis. Normal SCA and PCA origins. Posterior communicating
arteries are diminutive or absent. Bilateral PCA branches are within
normal limits.

Anterior circulation: Both ICA siphons are patent and somewhat
ectatic. On the left there is mild calcified plaque without
stenosis. Normal left ophthalmic artery origin. On the right there
is minimal plaque without stenosis. Normal right ophthalmic artery
origin. Patent carotid termini. Normal MCA and ACA origins. Mildly
dominant right A1. Normal anterior communicating artery and
bilateral ACA branches. Left MCA M1 segment bifurcates early without
stenosis. Left MCA branches are within normal limits. Right MCA M1
segment and trifurcation are patent without stenosis. Right MCA
branches are within normal limits.

Venous sinuses: Patent.

Anatomic variants: Non dominant left vertebral artery arises
directly from the arch.

Review of the MIP images confirms the above findings
IMPRESSION: 1. Negative for large vessel occlusion.
2. Arterial ectasia with minimal for age atherosclerosis. No
arterial stenosis.

## 2018-12-04 MED ORDER — BUTALBITAL-APAP-CAFFEINE 50-325-40 MG PO TABS: 1.0000 | ORAL_TABLET | Freq: Four times a day (QID) | ORAL | 0 refills | Status: AC | PRN

## 2018-12-04 MED ORDER — IOHEXOL 350 MG/ML SOLN
75.0000 mL | Freq: Once | INTRAVENOUS | Status: AC | PRN
  Administered 2018-12-04: 75 mL via INTRAVENOUS

## 2018-12-04 MED ORDER — BUTALBITAL-APAP-CAFFEINE 50-325-40 MG PO TABS
1.0000 | ORAL_TABLET | Freq: Once | ORAL | Status: AC
  Administered 2018-12-04: 1 via ORAL
  Filled 2018-12-04: qty 1

## 2018-12-04 NOTE — ED Notes (Signed)
Patient transported to CT 

## 2018-12-04 NOTE — Consult Note (Signed)
Requesting Physician: Dr. Francia Greaves    Chief Complaint: headache and difficulty with words   History obtained from: Patient and Chart     HPI:                                                                                                                                       Sean Willis is a 73 y.o. male with past medical history significant for coronary artery disease status post CABG with recent heart cath, atrial fibrillation on Xarelto, hypertension, hyperlipidemia obstructive sleep apnea presents to emergency department after having recurrent episodes of headache followed by difficulty getting words out and word salad since yesterday.  Patient has a history of migraines with language deficits and states that his symptoms were somewhat similar in the past.  However due to recent heart cath as well as being on atrial fibrillation and recurrent episodes, he was concerned and decided to bring patient to the emergency room.  Patient son who witnessed the episode states none of the episodes was associated with facial droop, arm weakness.  He would first have a headache, then have some difficulty getting words out, at times have blurry vision and dizziness and symptoms would resolve in 20 to 30 minutes.  Comprehension remains intact the entire time and patient recalls the event.  MRI brain was performed in the emergency room due to small areas of punctate infarcts in the right and left frontal lobes.  Neurology was consulted for further recommendations.   Past Medical History:  Diagnosis Date  . Benign essential HTN 11/24/2013  . BPH (benign prostatic hyperplasia)   . CAD (coronary artery disease) 01/23/2013  . CAD (coronary artery disease), native coronary artery 04/2011   S/P CABG X4 Sx done in [place]  . Carotid arterial disease ([place]) 11/24/2014  . Carotid artery stenosis    1-39% bilateral by dopplers 2018  . Chest wall pain    Right side, muscular,ortho,MRI. U/S abd ok. On  lyrica  . Chronic insomnia   . Colon polyp 10/11  . Coronary artery disease 04/2011   s/p CABG in setting of MI in [place], [place], with initial PTCA of OM2 then CABG with LIMA to LAD, SVG to D1, SVG to OM1,/R1 and SVG to distal RCA  . DDD (degenerative disc disease), lumbar   . Dilatation of aorta ([facility])    4.5 cm ascending aortic aneurysm per chets ct 05-18-15 epic  . Dysrhythmia   . ED (erectile dysfunction)   . GERD (gastroesophageal reflux disease)   . Gout   . Gout   . History of kidney stones   . Hypertension    since mi in 2013 no htn  . Hypertriglyceridemia   . Kidney stone    uric acid, Dr [name]  renal insufficiency  . Mitral regurgitation 06/04/2016   Moderate by echo 2017  . Mixed hyperlipidemia 01/23/2013  . Myocardial infarction ([place])  2013  . OSA on CPAP 07/16/2016  . Osteoarthritis    of the knee  . Permanent atrial fibrillation ([place])    s/p DCCV 01/2016.  CHASDS2VASC score is 3  . Sleep apnea     Past Surgical History:  Procedure Laterality Date  . CARDIOVERSION [name] 01/20/2016   Procedure: CARDIOVERSION;  Surgeon: Thayer Headings, MD;  Location: [facility];  Service: Cardiovascular;  Laterality: [name];  . CARDIOVERSION [name] 03/23/2016   Procedure: CARDIOVERSION;  Surgeon: Sueanne Margarita, MD;  Location: [facility];  Service: Cardiovascular;  Laterality: [name];  . CARDIOVERSION    . CATARACT EXTRACTION W/PHACO Right 10/26/2014   Procedure: CATARACT EXTRACTION PHACO AND INTRAOCULAR LENS PLACEMENT (IOC);  Surgeon: [name], MD;  Location: [facility];  Service: Ophthalmology;  Laterality: Right;  LOT PACK: [id number] H US:01:43.0 AP:27.8 CDE:28.68  . CATARACT EXTRACTION W/PHACO Left 11/13/2016   Procedure: CATARACT EXTRACTION PHACO AND INTRAOCULAR LENS PLACEMENT (IOC);  Surgeon: [name], MD;  Location: [facility];  Service: Ophthalmology;  Laterality: Left;  [place] 01:00.2 AP% 15.7 CDE 9.47 Fluid Pack lot # [id number]  . COLONOSCOPY WITH PROPOFOL [name] 09/27/2015    Procedure: COLONOSCOPY WITH PROPOFOL;  Surgeon: [name], MD;  Location: [facility];  Service: Endoscopy;  Laterality: [name];  . CORONARY ARTERY BYPASS GRAFT  04/2011   Severe multivessel ASCAD s/p Stemi w PTCA of OM2 then s/p CABG w LIMA to LAD,SVG to OM1 /RI and SVG to Distal RCA   . FINGER SURGERY     left finger for ganglion cyst  . KIDNEY STONE SURGERY    . LEFT HEART CATH AND CORS/GRAFTS ANGIOGRAPHY [name] 12/02/2018   Procedure: LEFT HEART CATH AND CORS/GRAFTS ANGIOGRAPHY;  Surgeon: Belva Crome, MD;  Location: [facility];  Service: Cardiovascular;  Laterality: [name];    Family History  Problem Relation Age of Onset  . Alzheimer's disease Mother   . Cancer Mother   . CVA Father   . Arrhythmia Father   . Arrhythmia Sister    Social History:  reports that he has never smoked. He has never used smokeless tobacco. He reports current alcohol use. He reports that he does not use drugs.  Allergies:  Allergies  Allergen Reactions  . Celebrex [Celecoxib]     Upset stomach    Medications:                                                                                                                        I reviewed home medications   ROS:  14 systems reviewed and negative except above    Examination:                                                                                                      General: Appears well-developed  Psych: Affect appropriate to situation Eyes: No scleral injection HENT: No OP obstrucion Head: Normocephalic.  Cardiovascular: Normal rate and regular rhythm.  Respiratory: Effort normal and breath sounds normal to anterior ascultation GI: Soft.  No distension. There is no tenderness.  Skin: WDI    Neurological Examination Mental Status: Alert, oriented, thought content appropriate.  Speech fluent  without evidence of aphasia. Able to follow 3 step commands without difficulty. Cranial Nerves: II: Visual fields grossly normal,  III,IV, VI: ptosis not present, extra-ocular motions intact bilaterally, pupils equal, round, reactive to light and accommodation V,VII: smile symmetric, facial light touch sensation normal bilaterally VIII: hearing normal bilaterally IX,X: uvula rises symmetrically XI: bilateral shoulder shrug XII: midline tongue extension Motor: Right : Upper extremity   5/5    Left:     Upper extremity   5/5  Lower extremity   5/5     Lower extremity   5/5 Tone and bulk:normal tone throughout; no atrophy noted Sensory: Pinprick and light touch intact throughout, bilaterally Deep Tendon Reflexes: 2+ and symmetric throughout Plantars: Right: downgoing   Left: downgoing Cerebellar: normal finger-to-nose, normal rapid alternating movements and normal heel-to-shin test Gait: normal gait and station     Lab Results: Basic Metabolic Panel: Recent Labs  Lab 12/01/18 0815 12/03/18 1510 12/03/18 1547  NA 137 137 137  K 4.4 4.6 4.4  CL 102 103 105  CO2 25 25  --   GLUCOSE 109* 101* 96  BUN 18 18 21   CREATININE 1.31* 1.33* 1.30*  CALCIUM 9.1 9.4  --     CBC: Recent Labs  Lab 12/01/18 0815 12/03/18 1510 12/03/18 1547  WBC 10.0 9.4  --   NEUTROABS  --  6.9  --   HGB 16.7 17.7* 18.0*  HCT 49.5 53.6* 53.0*  MCV 93 95.4  --   PLT 175 167  --     Coagulation Studies: Recent Labs    12/03/18 1510  LABPROT 17.3*  INR 1.4*    Imaging: Ct Angio Head W Or Wo Contrast  Result Date: 12/04/2018 CLINICAL DATA:  73 year old male status post cardiac angiogram yesterday with new recurrent, transient garbled speech, blurred vision and headache. One or two small acute cortical infarcts in the anterior frontal lobes on MRI earlier tonight. EXAM: CT ANGIOGRAPHY HEAD AND NECK TECHNIQUE: Multidetector CT imaging of the head and neck was performed using the standard  protocol during bolus administration of intravenous contrast. Multiplanar CT image reconstructions and MIPs were obtained to evaluate the vascular anatomy. Carotid stenosis measurements (when applicable) are obtained utilizing NASCET criteria, using the distal internal carotid diameter as the denominator. CONTRAST:  7mL OMNIPAQUE IOHEXOL 350 MG/ML SOLN COMPARISON:  Brain MRI 12/03/2018. FINDINGS: CTA NECK Skeleton: Prior sternotomy. Widespread cervical spine degeneration. Subtle degenerative appearing anterolisthesis at the cervicothoracic junction. No acute  osseous abnormality identified. Upper chest: Prior CABG. Otherwise negative. Other neck: Negative. Aortic arch: 4 vessel arch configuration, the left vertebral arises directly from the arch. Minimal arch atherosclerosis. Right carotid system: Tortuous brachiocephalic artery with a mildly kinked appearance. Negative right CCA origin. Mild soft plaque at the posterior right ICA origin without stenosis. Left carotid system: Tortuous proximal left CCA with minimal plaque and no stenosis. Negative left carotid bifurcation, no cervical left ICA stenosis. Vertebral arteries: No proximal right subclavian artery stenosis despite mild plaque. Normal right vertebral artery origin. Dominant right vertebral artery is patent and mildly ectatic to the skull base. Left vertebral artery arises directly from the arch and is non dominant. Patent left vertebral to the skull base without stenosis. CTA HEAD Posterior circulation: Right greater than left vertebral artery V4 segment calcified plaque without hemodynamically significant stenosis. Dominant right vertebral. Patent PICA origins and vertebrobasilar junction. Ectatic basilar artery is patent without stenosis. Normal SCA and PCA origins. Posterior communicating arteries are diminutive or absent. Bilateral PCA branches are within normal limits. Anterior circulation: Both ICA siphons are patent and somewhat ectatic. On the left  there is mild calcified plaque without stenosis. Normal left ophthalmic artery origin. On the right there is minimal plaque without stenosis. Normal right ophthalmic artery origin. Patent carotid termini. Normal MCA and ACA origins. Mildly dominant right A1. Normal anterior communicating artery and bilateral ACA branches. Left MCA M1 segment bifurcates early without stenosis. Left MCA branches are within normal limits. Right MCA M1 segment and trifurcation are patent without stenosis. Right MCA branches are within normal limits. Venous sinuses: Patent. Anatomic variants: Non dominant left vertebral artery arises directly from the arch. Review of the MIP images confirms the above findings IMPRESSION: 1. Negative for large vessel occlusion. 2. Arterial ectasia with minimal for age atherosclerosis. No arterial stenosis. Electronically Signed   By: Genevie Ann M.D.   On: 12/04/2018 01:42   Ct Head Wo Contrast  Result Date: 12/03/2018 CLINICAL DATA:  Headache, acute, normal neuro exam Possible stroke. Episodes of garbled speech, blurry vision, and headache. Cardiac catheterization yesterday. EXAM: CT HEAD WITHOUT CONTRAST TECHNIQUE: Contiguous axial images were obtained from the base of the skull through the vertex without intravenous contrast. COMPARISON:  Brain MRI 03/09/2018 FINDINGS: Brain: No intracranial hemorrhage, mass effect, or midline shift. Brain volume is normal for age. No hydrocephalus. The basilar cisterns are patent. Mild to moderate chronic small vessel ischemia. No evidence of territorial infarct or acute ischemia. No extra-axial or intracranial fluid collection. Vascular: Generalized increased densities in the intravascular structures likely related to residual IV contrast from cardiac catheterization yesterday. No focal hyperdense vessel. Skull: No fracture or focal lesion. Sinuses/Orbits: Paranasal sinuses and mastoid air cells are clear. The visualized orbits are unremarkable. Bilateral cataract  resection. Other: None. IMPRESSION: 1. No acute intracranial abnormality. 2. Age related atrophy. Mild to moderate chronic small vessel ischemia. Electronically Signed   By: Keith Rake M.D.   On: 12/03/2018 18:07   Ct Angio Neck W And/or Wo Contrast  Result Date: 12/04/2018 CLINICAL DATA:  73 year old male status post cardiac angiogram yesterday with new recurrent, transient garbled speech, blurred vision and headache. One or two small acute cortical infarcts in the anterior frontal lobes on MRI earlier tonight. EXAM: CT ANGIOGRAPHY HEAD AND NECK TECHNIQUE: Multidetector CT imaging of the head and neck was performed using the standard protocol during bolus administration of intravenous contrast. Multiplanar CT image reconstructions and MIPs were obtained to evaluate the vascular anatomy. Carotid  stenosis measurements (when applicable) are obtained utilizing NASCET criteria, using the distal internal carotid diameter as the denominator. CONTRAST:  28mL OMNIPAQUE IOHEXOL 350 MG/ML SOLN COMPARISON:  Brain MRI 12/03/2018. FINDINGS: CTA NECK Skeleton: Prior sternotomy. Widespread cervical spine degeneration. Subtle degenerative appearing anterolisthesis at the cervicothoracic junction. No acute osseous abnormality identified. Upper chest: Prior CABG. Otherwise negative. Other neck: Negative. Aortic arch: 4 vessel arch configuration, the left vertebral arises directly from the arch. Minimal arch atherosclerosis. Right carotid system: Tortuous brachiocephalic artery with a mildly kinked appearance. Negative right CCA origin. Mild soft plaque at the posterior right ICA origin without stenosis. Left carotid system: Tortuous proximal left CCA with minimal plaque and no stenosis. Negative left carotid bifurcation, no cervical left ICA stenosis. Vertebral arteries: No proximal right subclavian artery stenosis despite mild plaque. Normal right vertebral artery origin. Dominant right vertebral artery is patent and  mildly ectatic to the skull base. Left vertebral artery arises directly from the arch and is non dominant. Patent left vertebral to the skull base without stenosis. CTA HEAD Posterior circulation: Right greater than left vertebral artery V4 segment calcified plaque without hemodynamically significant stenosis. Dominant right vertebral. Patent PICA origins and vertebrobasilar junction. Ectatic basilar artery is patent without stenosis. Normal SCA and PCA origins. Posterior communicating arteries are diminutive or absent. Bilateral PCA branches are within normal limits. Anterior circulation: Both ICA siphons are patent and somewhat ectatic. On the left there is mild calcified plaque without stenosis. Normal left ophthalmic artery origin. On the right there is minimal plaque without stenosis. Normal right ophthalmic artery origin. Patent carotid termini. Normal MCA and ACA origins. Mildly dominant right A1. Normal anterior communicating artery and bilateral ACA branches. Left MCA M1 segment bifurcates early without stenosis. Left MCA branches are within normal limits. Right MCA M1 segment and trifurcation are patent without stenosis. Right MCA branches are within normal limits. Venous sinuses: Patent. Anatomic variants: Non dominant left vertebral artery arises directly from the arch. Review of the MIP images confirms the above findings IMPRESSION: 1. Negative for large vessel occlusion. 2. Arterial ectasia with minimal for age atherosclerosis. No arterial stenosis. Electronically Signed   By: Genevie Ann M.D.   On: 12/04/2018 01:42   Mr Brain Wo Contrast  Result Date: 12/03/2018 CLINICAL DATA:  73 year old male status post cardiac angiogram yesterday with new recurrent, transient garbled speech, blurred vision and headache EXAM: MRI HEAD WITHOUT CONTRAST TECHNIQUE: Multiplanar, multiecho pulse sequences of the brain and surrounding structures were obtained without intravenous contrast. COMPARISON:  Head CT earlier  today. Brain MRI 03/09/2018. FINDINGS: Brain: There is a small round cortical infarct in the anterior left superior frontal gyrus best seen on series 11, image 61. Possible subtle contralateral right anterior frontal lobe cortical infarct on series 9, image 84. No other restricted diffusion identified. Multiple chronic microhemorrhages in the left deep gray nuclei, that on series 18, image 33 is new since March. Stable background bilateral basal ganglia T2 heterogeneity may indicate a combination of perivascular spaces and small chronic lacunar infarcts. Patchy bilateral cerebral white matter T2 and FLAIR hyperintensity. No cortical encephalomalacia identified. No midline shift, mass effect, evidence of mass lesion, ventriculomegaly, extra-axial collection or acute intracranial hemorrhage. Cervicomedullary junction and pituitary are within normal limits. Vascular: Major intracranial vascular flow voids are stable. Generalized intracranial artery dolichoectasia. Skull and upper cervical spine: Negative visible cervical spine. Visualized bone marrow signal is within normal limits. Sinuses/Orbits: Stable and negative. Other: Mastoids remain clear. Visible internal auditory structures appear normal.  IMPRESSION: 1. Small acute cortical infarct in the anterior left superior frontal gyrus, and possibly a 2nd subtle infarct in the contralateral right frontal lobe. No associated hemorrhage or mass effect. 2. Underlying chronic small vessel disease with mild progression since a March MRI. 3. Generalized intracranial artery dolichoectasia. Electronically Signed   By: Genevie Ann M.D.   On: 12/03/2018 23:25     ASSESSMENT AND PLAN  73 year old male patient with history of migraines status post heart cath as well as atrial fibrillation on Xarelto with 3 episodes of headache associated with language difficulty.  Patient's headache improved after receiving migraine cocktail in the ER.  MRI brain showed 2 small areas of punctate  infarction which I think is likely representative of incidental findings given patient has A. fib and had to discontinue 1 dose of Xarelto for the procedure versus postprocedural incidental infarcts.  Location of the infarct does not explain language deficits.   Migraine with aura -Migraine cocktail  Incidental infarcts -Likely in the setting of A. fib on Xarelto having missed 1 dose, incidental infarcts in the setting of post heart catheter procedure -CTA head and neck does not show any significant stenosis, recent echocardiogram shows dilated left atrium.  No need to repeat echo is unlikely to change management as patient already on anticoagulation -Outpatient neurology follow-up -Counseled on stroke symptoms and told pain patient needs to contact to ER if he has symptoms concerning for stroke   Sushanth Aroor [facility] Pager Number [phone]

## 2018-12-16 ENCOUNTER — Ambulatory Visit: Payer: Medicare Other | Admitting: Cardiology

## 2018-12-17 NOTE — Progress Notes (Signed)
CARDIOLOGY OFFICE NOTE  Date:  12/23/2018    Sean Willis Date of Birth: 1945/07/04 Medical Record [id number]  PCP:  [name], MD  Cardiologist:  Radford Pax  Chief Complaint  Patient presents with  . Follow-up    Post cath visit    History of Present Illness: Sean Willis is a 73 y.o. male who presents today for a post cath visit. Seen for [name].   He has a history of CAD status post CABG 4 2013 in setting of MI in [place], [place] with initial PTCA of the OM 2 and subsequent CABG with LIMA to the LAD, SVG to diagonal 1, SVG to OM1/R1 and SVG to distal RCA.Other issues include moderately dilated aortic root ([place]), severe OSA with inability to tolerate CPAP, HTN, HLD, PAF with prior cardioversion twice in 2018, on chronic anticoagulation with Xarelto. He has failed to maintain NSR and is managed with rate control and did not wish to be on AAD therapy.   Seen about a month ago - doing ok but had had some indigestion and DOE at times. Wife had just passed with stomach and ovarian cancer. He thought he was depressed. Lexiscan was arranged - this was abnormal - echo also showed decreased EF - cardiac cath was recommended - see below. He has had multiple med changes - started Losartan, then Entresto and Aldactone.   The patient does not have symptoms concerning for COVID-19 infection (fever, chills, cough, or new shortness of breath).   Comes in today. Here alone. Has lots of questions. He seems to understand the results of the cath but we have reviewed again. Asking about other treatment modalities. He has not had any more "indigestion". He notes he will have some soreness - vague - not exertional. He took Entresto just twice - both days approximately 2 hours later - had dizziness, BP low, difficulty talking - actually ended up back in the ER - was told this was not a stroke but migraine - he really felt like it was from the [place] - he thinks he was  taking the Aldactone at least the first time this happened - not sure about the second - so stopped that too - has transitioned back to Losartan. BP now around 140/90. He has had no more "stroke like symptoms". He was using aspirin just prn. He does not have any NTG.   Past Medical History:  Diagnosis Date  . Benign essential HTN 11/24/2013  . BPH (benign prostatic hyperplasia)   . CAD (coronary artery disease) 01/23/2013  . CAD (coronary artery disease), native coronary artery 04/2011   S/P CABG X4 [place]  . Carotid arterial disease ([place]) 11/24/2014  . Carotid artery stenosis    1-39% bilateral by dopplers 2018  . Chest wall pain    Right side, muscular,ortho,MRI. U/S abd ok. On lyrica  . Chronic insomnia   . Colon polyp 10/11  . Coronary artery disease 04/2011   s/p CABG in setting of MI in [place], [place], with initial PTCA of OM2 then CABG with LIMA to LAD, SVG to D1, SVG to OM1,/R1 and SVG to distal RCA  . DDD (degenerative disc disease), lumbar   . Dilatation of aorta (H[facility])    4.5 cm ascending aortic aneurysm per chets ct 05-18-15 epic  . Dysrhythmia   . ED (erectile dysfunction)   . GERD (gastroesophageal reflux disease)   . Gout   . Gout   .  History of kidney stones   . Hypertension    since mi in 2013 no htn  . Hypertriglyceridemia   . Kidney stone    uric acid, Dr [name]  renal insufficiency  . Mitral regurgitation 06/04/2016   Moderate by echo 2017  . Mixed hyperlipidemia 01/23/2013  . Myocardial infarction ([place])    2013  . OSA on CPAP 07/16/2016  . Osteoarthritis    of the knee  . Permanent atrial fibrillation ([place])    s/p DCCV 01/2016.  CHASDS2VASC score is 3  . Sleep apnea     Past Surgical History:  Procedure Laterality Date  . CARDIOVERSION [name] 01/20/2016   Procedure: CARDIOVERSION;  Surgeon: Thayer Headings, MD;  Location: [facility];  Service: Cardiovascular;  Laterality: [name];  . CARDIOVERSION [name] 03/23/2016   Procedure: CARDIOVERSION;   Surgeon: Sueanne Margarita, MD;  Location: [facility];  Service: Cardiovascular;  Laterality: [name];  . CARDIOVERSION    . CATARACT EXTRACTION W/PHACO Right 10/26/2014   Procedure: CATARACT EXTRACTION PHACO AND INTRAOCULAR LENS PLACEMENT (IOC);  Surgeon: [name], MD;  Location: [facility];  Service: Ophthalmology;  Laterality: Right;  LOT PACK: [id number] H US:01:43.0 AP:27.8 CDE:28.68  . CATARACT EXTRACTION W/PHACO Left 11/13/2016   Procedure: CATARACT EXTRACTION PHACO AND INTRAOCULAR LENS PLACEMENT (IOC);  Surgeon: [name], MD;  Location: [facility];  Service: Ophthalmology;  Laterality: Left;  [place] 01:00.2 AP% 15.7 CDE 9.47 Fluid Pack lot # [id number]  . COLONOSCOPY WITH PROPOFOL [name] 09/27/2015   Procedure: COLONOSCOPY WITH PROPOFOL;  Surgeon: [name], MD;  Location: [facility];  Service: Endoscopy;  Laterality: [name];  . CORONARY ARTERY BYPASS GRAFT  04/2011   Severe multivessel ASCAD s/p Stemi w PTCA of OM2 then s/p CABG w LIMA to LAD,SVG to OM1 /RI and SVG to Distal RCA   . FINGER SURGERY     left finger for ganglion cyst  . KIDNEY STONE SURGERY    . LEFT HEART CATH AND CORS/GRAFTS ANGIOGRAPHY [name] 12/02/2018   Procedure: LEFT HEART CATH AND CORS/GRAFTS ANGIOGRAPHY;  Surgeon: Belva Crome, MD;  Location: [facility];  Service: Cardiovascular;  Laterality: [name];     Medications: Current Meds  Medication Sig  . acetaminophen (TYLENOL) 500 MG tablet Take 1,000 mg by mouth every 8 (eight) hours as needed for moderate pain or headache.  . allopurinol (ZYLOPRIM) 100 MG tablet Take 100 mg by mouth daily as needed (gout).   [name] aspirin EC 81 MG tablet Take 162 mg by mouth daily.  . butalbital-acetaminophen-caffeine (FIORICET) 50-325-40 MG tablet Take 1-2 tablets by mouth every 6 (six) hours as needed for headache.  . Coenzyme Q10 (COQ-10 PO) Take 1 tablet by mouth daily.   [name] doxepin (SINEQUAN) 25 MG capsule Take 25 mg by mouth at bedtime.   [name] escitalopram (LEXAPRO) 20  MG tablet Take 10 mg by mouth daily as needed (depression).   [name] GINKGO BILOBA PO Take 1 capsule by mouth daily.  . Homeopathic Products (EARACHE DROPS OT) Place 1-2 drops 3 (three) times daily as needed in ear(s) (for ear pain.).  [name] LYSINE PO Take 1 tablet by mouth daily as needed (fever blisters).  . metoprolol tartrate (LOPRESSOR) 50 MG tablet Take 1 tablet (50 mg total) by mouth 2 (two) times daily.  . mometasone (NASONEX) 50 MCG/ACT nasal spray Place 2 sprays into the nose daily as needed (allergies).   . pravastatin (PRAVACHOL) 80 MG tablet Take 40 mg by mouth 2 (two) times daily.  [name]  rivaroxaban (XARELTO) 20 MG TABS tablet Take 20 mg by mouth daily with supper.  . zolpidem (AMBIEN) 5 MG tablet Take 2.5-5 mg by mouth at bedtime as needed for sleep.   . [DISCONTINUED] losartan (COZAAR) 25 MG tablet Take 25 mg by mouth daily.     Allergies: Allergies  Allergen Reactions  . Celebrex [Celecoxib]     Upset stomach  . Entresto [Sacubitril-Valsartan]     Low BP/difficulty talking/migraine    Social History: The patient  reports that he has never smoked. He has never used smokeless tobacco. He reports current alcohol use. He reports that he does not use drugs.   Family History: The patient's family history includes Alzheimer's disease in his mother; Arrhythmia in his father and sister; CVA in his father; Cancer in his mother.   Review of Systems: Please see the history of present illness.   All other systems are reviewed and negative.   Physical Exam: VS:  BP (!) 134/92   Pulse 80   Ht 5' 9.5" (1.765 m)   Wt 187 lb 6.4 oz (85 kg)   SpO2 95%   BMI 27.28 kg/m  .  BMI Body mass index is 27.28 kg/m.  Wt Readings from Last 3 Encounters:  12/23/18 187 lb 6.4 oz (85 kg)  12/03/18 182 lb (82.6 kg)  12/02/18 182 lb (82.6 kg)    General: Pleasant. Well developed, well nourished and in no acute distress.   HEENT: Normal.  Neck: Supple, no JVD, carotid bruits, or masses noted.    Cardiac: Regular rate and rhythm. No murmurs, rubs, or gallops. No edema.  Respiratory:  Lungs are clear to auscultation bilaterally with normal work of breathing.  GI: Soft and nontender.  MS: No deformity or atrophy. Gait and ROM intact.  Skin: Warm and dry. Color is normal.  Neuro:  Strength and sensation are intact and no gross focal deficits noted.  Psych: Alert, appropriate and with normal affect. Cath site is fine.    LABORATORY DATA:  EKG:  EKG is not ordered today.  Lab Results  Component Value Date   WBC 9.4 12/03/2018   HGB 18.0 (H) 12/03/2018   HCT 53.0 (H) 12/03/2018   PLT 167 12/03/2018   GLUCOSE 96 12/03/2018   CHOL 145 11/07/2018   TRIG 167 (H) 11/07/2018   HDL 34 (L) 11/07/2018   LDLCALC 82 11/07/2018   ALT 26 12/03/2018   AST 25 12/03/2018   NA 137 12/03/2018   K 4.4 12/03/2018   CL 105 12/03/2018   CREATININE 1.30 (H) 12/03/2018   BUN 21 12/03/2018   CO2 25 12/03/2018   INR 1.4 (H) 12/03/2018     BNP (last 3 results) No results for input(s): BNP in the last 8760 hours.  ProBNP (last 3 results) No results for input(s): PROBNP in the last 8760 hours.   Other Studies Reviewed Today:  LEFT HEART CATH AND CORS/GRAFTS ANGIOGRAPHY 12/2018  Conclusion   Bypass graft failure with occlusion of the saphenous vein graft to the right coronary and atresia internal mammary graft to the LAD.  There is also loss of the end-to-side anastomosis of the sequential graft to the obtuse marginal.  Patent saphenous vein graft to the diagonal.  Patent bypass graft to the ramus intermedius  Widely patent left main  Severely diseased proximal to distal LAD with segmental 60 to 80% proximal to mid followed by diffuse disease up to 90% in the mid to distal vessel.  A potential target for  PCI could be the proximal segment.  The segment overlaps the diagonal which is severely diseased but supplied by vein graft.  Totally occluded ramus intermedius  Totally occluded  dominant obtuse marginal  Widely patent but diffusely diseased right coronary from proximal to distal and there is 70 to 80% stenosis in a moderate-sized second left ventricular branch.  LVEDP is normal.  There is severe mid anterior wall hypokinesis.  EF is approximately 35 to 40%.  RECOMMENDATION:   In absence of anterior wall ischemia, no treatment on the LAD was applied.  If symptoms persist consideration of proximal LAD stent via femoral approach could be contemplated.  This however will still leave severe diffuse mid and distal LAD disease.  Guideline directed therapy for LV systolic dysfunction.  Aggressive secondary prevention for coronary atherosclerosis.    ECHO IMPRESSIONS 11/2018   1. Left ventricular ejection fraction, by visual estimation, is 35 to 40%. The left ventricle has moderately decreased function. Left ventricular septal wall thickness was normal. There is no left ventricular hypertrophy.  2. Left ventricular diastolic parameters are indeterminate.  3. Moderately dilated left ventricular internal cavity size.  4. Diffuse hypokinesis.  5. Global right ventricle has normal systolic function.The right ventricular size is normal. No increase in right ventricular wall thickness.  6. Left atrial size was severely dilated.  7. Right atrial size was normal.  8. The mitral valve is normal in structure. Moderate mitral valve regurgitation.  9. The tricuspid valve is normal in structure. Tricuspid valve regurgitation is mild. 10. The aortic valve is tricuspid. Aortic valve regurgitation is mild. Mild to moderate aortic valve sclerosis/calcification without any evidence of aortic stenosis. 11. The pulmonic valve was grossly normal. Pulmonic valve regurgitation is mild. 12. Aortic dilatation noted. 13. There is moderate dilatation of the aortic root measuring 45 mm. 14. Mildly elevated pulmonary artery systolic pressure.   Myoview Study Highlights  11/2018    Nuclear stress EF: 47%.  No T wave inversion was noted during stress.  There was no ST segment deviation noted during stress.  Defect 1: There is a large defect of moderate severity.  This is an intermediate risk study.   Large size, moderate intensity fixed lateral wall perfusion defect consistent with scar. No reversible ischemia. LVEF 47% with lateral akinesis. This is an intermediate risk study. Compared to a prior study in 2015, there is a persistent lateral infarct, however, LVEF is higher.     ASSESSMENT & PLAN:    1. CAD - recent abnormal Myoview - remote CABG - now s/p cardiac cath - has noted bypass graft failure of the SVG to the RCA along with atresia of the LIMA to the LAD. There is also loss of the the end to side anastomosis of the sequential SVG to the OM.  Severe native LAD disease - felt to best manage medically in light of no ischemia on Myoview. Could consider PCI of the proximal LAD. His symptoms seem stable to me. Could consider adding Imdur if needed but will focus on getting him on a more guideline therapy for CHF.   2. HTN - Losartan has been recently added - increasing today.   3. ICM - chronic systolic HF - increasing Losartan to 50 mg a day - would not use Entresto due to probable side effect - hope to add back Aldactone. Needs lab today. Discussed salt restriction at length - this will be hard given his social situation and living alone now. Consider repeat limited echo in  3 months.   4. HLD - on statin  5. Persistent AF - on Xarelto - rate is controlled. Aspirin added due to CAD.   6. OSA - not treated - not able to tolerate  7. Dilated aorta - followed by Dr. [name]  8. COVID-19 Education: The signs and symptoms of COVID-19 were discussed with the patient and how to seek care for testing (follow up with PCP or arrange E-visit).  The importance of social distancing, staying at home, hand hygiene and wearing a mask when out in public were  discussed today.  Current medicines are reviewed with the patient today.  The patient does not have concerns regarding medicines other than what has been noted above.  The following changes have been made:  See above.  Labs/ tests ordered today include:    Orders Placed This Encounter  Procedures  . Basic metabolic panel  . CBC no Diff     Disposition:   FU with me in a few weeks.    Patient is agreeable to this plan and will call if any problems develop in the interim.   SignedTruitt Merle, NP  12/23/2018 11:58 AM  [facility] [address] [place], [place]  [address] Phone: [phone] Fax: [phone]

## 2018-12-23 ENCOUNTER — Ambulatory Visit (INDEPENDENT_AMBULATORY_CARE_PROVIDER_SITE_OTHER): Payer: Medicare Other | Admitting: Nurse Practitioner

## 2018-12-23 ENCOUNTER — Other Ambulatory Visit: Payer: Self-pay | Admitting: Nurse Practitioner

## 2018-12-23 ENCOUNTER — Other Ambulatory Visit: Payer: Self-pay

## 2018-12-23 ENCOUNTER — Encounter: Payer: Self-pay | Admitting: Nurse Practitioner

## 2018-12-23 ENCOUNTER — Other Ambulatory Visit: Payer: Medicare Other | Admitting: *Deleted

## 2018-12-23 VITALS — BP 134/92 | HR 80 | Ht 69.5 in | Wt 187.4 lb

## 2018-12-23 DIAGNOSIS — Z9889 Other specified postprocedural states: Secondary | ICD-10-CM

## 2018-12-23 DIAGNOSIS — I5022 Chronic systolic (congestive) heart failure: Secondary | ICD-10-CM

## 2018-12-23 DIAGNOSIS — E782 Mixed hyperlipidemia: Secondary | ICD-10-CM

## 2018-12-23 DIAGNOSIS — R9439 Abnormal result of other cardiovascular function study: Secondary | ICD-10-CM

## 2018-12-23 DIAGNOSIS — I255 Ischemic cardiomyopathy: Secondary | ICD-10-CM | POA: Diagnosis not present

## 2018-12-23 DIAGNOSIS — I2510 Atherosclerotic heart disease of native coronary artery without angina pectoris: Secondary | ICD-10-CM | POA: Diagnosis not present

## 2018-12-23 MED ORDER — NITROGLYCERIN 0.4 MG SL SUBL: 0.4000 mg | SUBLINGUAL_TABLET | SUBLINGUAL | 3 refills | Status: DC | PRN

## 2018-12-23 MED ORDER — LOSARTAN POTASSIUM 50 MG PO TABS: 50.0000 mg | ORAL_TABLET | Freq: Every day | ORAL | 3 refills | Status: DC

## 2018-12-23 NOTE — Patient Instructions (Addendum)
After Visit Summary:  We will be checking the following labs today - BMET & CBC   Medication Instructions:    Continue with your current medicines. BUT  I am increasing the Losartan to 50 mg a day - take 2 of your 25 mg tablets daily and use up - the RX for the 50 mg is at your pharmacy I have sent in a RX for NTG - Use your NTG under your tongue for recurrent chest pain. May take one tablet every 5 minutes. If you are still having discomfort after 3 tablets in 15 minutes, call 911.    If you need a refill on your cardiac medications before your next appointment, please call your pharmacy.     Testing/Procedures To Be Arranged:  [name]  Follow-Up:   See me in about 3 to 4 weeks - we will try to add back Spironolactone then  See [name] in about 3 months    At [facility], you and your health needs are our priority.  As part of our continuing mission to provide you with exceptional heart care, we have created designated Provider Care Teams.  These Care Teams include your primary Cardiologist (physician) and Advanced Practice Providers (APPs -  Physician Assistants and Nurse Practitioners) who all work together to provide you with the care you need, when you need it.  Special Instructions:  . Stay safe, stay home, wash your hands for at least 20 seconds and wear a mask when out in public.  . It was good to talk with you today.  . Salt restriction is imperative . Keep a check on your BP for [place].    Call the [place] office at [phone] if you have any questions, problems or concerns.      Low-Sodium Eating Plan Sodium, which is an element that makes up salt, helps you maintain a healthy balance of fluids in your body. Too much sodium can increase your blood pressure and cause fluid and waste to be held in your body. Your health care provider or dietitian may recommend following this plan if you have high blood pressure (hypertension),  kidney disease, liver disease, or heart failure. Eating less sodium can help lower your blood pressure, reduce swelling, and protect your heart, liver, and kidneys. What are tips for following this plan? General guidelines  Most people on this plan should limit their sodium intake to 1,500-2,000 mg (milligrams) of sodium each day. Reading food labels   The Nutrition Facts label lists the amount of sodium in one serving of the food. If you eat more than one serving, you must multiply the listed amount of sodium by the number of servings.  Choose foods with less than 140 mg of sodium per serving.  Avoid foods with 300 mg of sodium or more per serving. Shopping  Look for lower-sodium products, often labeled as "low-sodium" or "no salt added."  Always check the sodium content even if foods are labeled as "unsalted" or "no salt added".  Buy fresh foods. ? Avoid canned foods and premade or frozen meals. ? Avoid canned, cured, or processed meats  Buy breads that have less than 80 mg of sodium per slice. Cooking  Eat more home-cooked food and less restaurant, buffet, and fast food.  Avoid adding salt when cooking. Use salt-free seasonings or herbs instead of table salt or sea salt. Check with your health care provider or pharmacist before using salt substitutes.  Cook with plant-based  oils, such as canola, sunflower, or olive oil. Meal planning  When eating at a restaurant, ask that your food be prepared with less salt or no salt, if possible.  Avoid foods that contain MSG (monosodium glutamate). MSG is sometimes added to [place] food, bouillon, and some canned foods. What foods are recommended? The items listed may not be a complete list. Talk with your dietitian about what dietary choices are best for you. Grains Low-sodium cereals, including oats, puffed wheat and rice, and shredded wheat. Low-sodium crackers. Unsalted rice. Unsalted pasta. Low-sodium bread. Whole-grain breads and  whole-grain pasta. Vegetables Fresh or frozen vegetables. "No salt added" canned vegetables. "No salt added" tomato sauce and paste. Low-sodium or reduced-sodium tomato and vegetable juice. Fruits Fresh, frozen, or canned fruit. Fruit juice. Meats and other protein foods Fresh or frozen (no salt added) meat, poultry, seafood, and fish. Low-sodium canned tuna and salmon. Unsalted nuts. Dried peas, beans, and lentils without added salt. Unsalted canned beans. Eggs. Unsalted nut butters. Dairy Milk. Soy milk. Cheese that is naturally low in sodium, such as ricotta cheese, fresh mozzarella, or Swiss cheese Low-sodium or reduced-sodium cheese. Cream cheese. Yogurt. Fats and oils Unsalted butter. Unsalted margarine with no trans fat. Vegetable oils such as canola or olive oils. Seasonings and other foods Fresh and dried herbs and spices. Salt-free seasonings. Low-sodium mustard and ketchup. Sodium-free salad dressing. Sodium-free light mayonnaise. Fresh or refrigerated horseradish. Lemon juice. Vinegar. Homemade, reduced-sodium, or low-sodium soups. Unsalted popcorn and pretzels. Low-salt or salt-free chips. What foods are not recommended? The items listed may not be a complete list. Talk with your dietitian about what dietary choices are best for you. Grains Instant hot cereals. Bread stuffing, pancake, and biscuit mixes. Croutons. Seasoned rice or pasta mixes. Noodle soup cups. Boxed or frozen macaroni and cheese. Regular salted crackers. Self-rising flour. Vegetables Sauerkraut, pickled vegetables, and relishes. Olives. [place] fries. Onion rings. Regular canned vegetables (not low-sodium or reduced-sodium). Regular canned tomato sauce and paste (not low-sodium or reduced-sodium). Regular tomato and vegetable juice (not low-sodium or reduced-sodium). Frozen vegetables in sauces. Meats and other protein foods Meat or fish that is salted, canned, smoked, spiced, or pickled. Bacon, ham, sausage,  hotdogs, corned beef, chipped beef, packaged lunch meats, salt pork, jerky, pickled herring, anchovies, regular canned tuna, sardines, salted nuts. Dairy Processed cheese and cheese spreads. Cheese curds. Blue cheese. Feta cheese. String cheese. Regular cottage cheese. Buttermilk. Canned milk. Fats and oils Salted butter. Regular margarine. Ghee. Bacon fat. Seasonings and other foods Onion salt, garlic salt, seasoned salt, table salt, and sea salt. Canned and packaged gravies. Worcestershire sauce. Tartar sauce. Barbecue sauce. Teriyaki sauce. Soy sauce, including reduced-sodium. Steak sauce. Fish sauce. Oyster sauce. Cocktail sauce. Horseradish that you find on the shelf. Regular ketchup and mustard. Meat flavorings and tenderizers. Bouillon cubes. Hot sauce and Tabasco sauce. Premade or packaged marinades. Premade or packaged taco seasonings. Relishes. Regular salad dressings. Salsa. Potato and tortilla chips. Corn chips and puffs. Salted popcorn and pretzels. Canned or dried soups. Pizza. Frozen entrees and pot pies. Summary  Eating less sodium can help lower your blood pressure, reduce swelling, and protect your heart, liver, and kidneys.  Most people on this plan should limit their sodium intake to 1,500-2,000 mg (milligrams) of sodium each day.  Canned, boxed, and frozen foods are high in sodium. Restaurant foods, fast foods, and pizza are also very high in sodium. You also get sodium by adding salt to food.  Try to cook at home, eat  more fresh fruits and vegetables, and eat less fast food, canned, processed, or prepared foods. This information is not intended to replace advice given to you by your health care provider. Make sure you discuss any questions you have with your health care provider. Document Released: 06/09/2001 Document Revised: 11/30/2016 Document Reviewed: 12/12/2015 Elsevier Patient Education  2020 [employer].

## 2018-12-24 LAB — LIPID PANEL
Chol/HDL Ratio: 4.6 ratio (ref 0.0–5.0)
Cholesterol, Total: 162 mg/dL (ref 100–199)
HDL: 35 mg/dL — ABNORMAL LOW (ref >39)
LDL Chol Calc (NIH): 98 mg/dL (ref 0–99)
Triglycerides: 164 mg/dL — ABNORMAL HIGH (ref 0–149)
VLDL Cholesterol Cal: 29 mg/dL (ref 5–40)

## 2018-12-24 LAB — CBC
Hematocrit: 50 % (ref 37.5–51.0)
Hemoglobin: 16.3 g/dL (ref 13.0–17.7)
MCH: 31.5 pg (ref 26.6–33.0)
MCHC: 32.6 g/dL (ref 31.5–35.7)
MCV: 97 fL (ref 79–97)
Platelets: 173 10*3/uL (ref 150–450)
RBC: 5.17 x10E6/uL (ref 4.14–5.80)
RDW: 13.2 % (ref 11.6–15.4)
WBC: 7.3 10*3/uL (ref 3.4–10.8)

## 2018-12-24 LAB — BASIC METABOLIC PANEL
BUN/Creatinine Ratio: 13 (ref 10–24)
BUN: 16 mg/dL (ref 8–27)
CO2: 18 mmol/L — ABNORMAL LOW (ref 20–29)
Calcium: 9.4 mg/dL (ref 8.6–10.2)
Chloride: 103 mmol/L (ref 96–106)
Creatinine, Ser: 1.24 mg/dL (ref 0.76–1.27)
GFR calc Af Amer: 66 mL/min/{1.73_m2} (ref >59)
GFR calc non Af Amer: 57 mL/min/{1.73_m2} — ABNORMAL LOW (ref >59)
Glucose: 93 mg/dL (ref 65–99)
Potassium: 4.8 mmol/L (ref 3.5–5.2)
Sodium: 141 mmol/L (ref 134–144)

## 2018-12-24 LAB — ALT: ALT: 18 IU/L (ref 0–44)

## 2018-12-30 ENCOUNTER — Telehealth: Payer: Self-pay

## 2018-12-30 NOTE — Telephone Encounter (Signed)
-----   Message from Burtis Junes, NP sent at 12/29/2018  5:42 PM EST ----- Ok to report. See other lab note as well.  Lipids are fair - LDL not at goal - will plan to discuss further with him on return visit. Otherwise, would continue on current regimen.

## 2018-12-30 NOTE — Telephone Encounter (Signed)
-----   Message from Burtis Junes, NP sent at 12/29/2018  5:38 PM EST ----- Ok to report. Lipid result to follow as well.  Labs are stable - would continue on current regimen.

## 2018-12-30 NOTE — Telephone Encounter (Signed)
Spoke with pt, advised of lab results, no changes in medications.

## 2019-01-05 ENCOUNTER — Telehealth: Payer: Self-pay

## 2019-01-05 DIAGNOSIS — E782 Mixed hyperlipidemia: Secondary | ICD-10-CM

## 2019-01-05 DIAGNOSIS — Z951 Presence of aortocoronary bypass graft: Secondary | ICD-10-CM

## 2019-01-05 MED ORDER — ATORVASTATIN CALCIUM 80 MG PO TABS: 80.0000 mg | ORAL_TABLET | Freq: Every day | ORAL | 3 refills | Status: DC

## 2019-01-05 NOTE — Telephone Encounter (Signed)
-----   Message from Frederik Schmidt, RN sent at 01/05/2019  5:07 PM EST -----  ----- Message ----- From: Sueanne Margarita, MD Sent: 01/05/2019   5:04 PM EST To: [name], MD, [facility] Triage  Please stop Pravastatin and change to atorvastatin 80mg  daily and repeat FLP and ALT in 6 weeks

## 2019-01-05 NOTE — Telephone Encounter (Signed)
Spoke with patient about medication change per [name]. Patient verbalized understanding and agreed with plan.

## 2019-01-12 ENCOUNTER — Ambulatory Visit: Payer: Medicare Other | Admitting: Nurse Practitioner

## 2019-01-21 ENCOUNTER — Ambulatory Visit: Payer: Medicare Other | Attending: Internal Medicine

## 2019-01-21 DIAGNOSIS — Z23 Encounter for immunization: Secondary | ICD-10-CM

## 2019-01-21 NOTE — Progress Notes (Signed)
   Covid-19 Vaccination Clinic  Name:  Sean Willis    MRN: [id number] DOB: Jun 09, 1945  01/21/2019  [name] was observed post Covid-19 immunization for 15 minutes without incidence. He was provided with Vaccine Information Sheet and instruction to access the V-Safe system.   [name] was instructed to call 911 with any severe reactions post vaccine: [name] Difficulty breathing  . Swelling of your face and throat  . A fast heartbeat  . A bad rash all over your body  . Dizziness and weakness    Immunizations Administered    Name Date Dose VIS Date Route   Pfizer COVID-19 Vaccine 01/21/2019  5:44 PM 0.3 mL 12/12/2018 Intramuscular   Manufacturer: [place]   Lot: [id number]   [place]: [id number]

## 2019-02-04 ENCOUNTER — Other Ambulatory Visit: Payer: Self-pay | Admitting: Cardiology

## 2019-02-04 DIAGNOSIS — I4821 Permanent atrial fibrillation: Secondary | ICD-10-CM

## 2019-02-04 DIAGNOSIS — E782 Mixed hyperlipidemia: Secondary | ICD-10-CM

## 2019-02-04 DIAGNOSIS — G4733 Obstructive sleep apnea (adult) (pediatric): Secondary | ICD-10-CM

## 2019-02-04 DIAGNOSIS — I2510 Atherosclerotic heart disease of native coronary artery without angina pectoris: Secondary | ICD-10-CM

## 2019-02-04 DIAGNOSIS — I10 Essential (primary) hypertension: Secondary | ICD-10-CM

## 2019-02-04 DIAGNOSIS — I77819 Aortic ectasia, unspecified site: Secondary | ICD-10-CM

## 2019-02-04 DIAGNOSIS — I6523 Occlusion and stenosis of bilateral carotid arteries: Secondary | ICD-10-CM

## 2019-02-11 ENCOUNTER — Ambulatory Visit: Payer: Medicare Other | Attending: Internal Medicine

## 2019-02-11 DIAGNOSIS — Z23 Encounter for immunization: Secondary | ICD-10-CM | POA: Insufficient documentation

## 2019-02-11 NOTE — Progress Notes (Signed)
   Covid-19 Vaccination Clinic  Name:  Sean Willis    MRN: [id number] DOB: 23-Feb-1945  02/11/2019  Sean Willis was observed post Covid-19 immunization for 15 minutes without incidence. He was provided with Vaccine Information Sheet and instruction to access the V-Safe system.   Sean Willis was instructed to call 911 with any severe reactions post vaccine: [name] Difficulty breathing  . Swelling of your face and throat  . A fast heartbeat  . A bad rash all over your body  . Dizziness and weakness    Immunizations Administered    Name Date Dose VIS Date Route   Pfizer COVID-19 Vaccine 02/11/2019  4:01 PM 0.3 mL 12/12/2018 Intramuscular   Manufacturer: [place]   Lot: [id number]   [place]: [id number]

## 2019-02-16 ENCOUNTER — Other Ambulatory Visit: Payer: Self-pay

## 2019-02-16 ENCOUNTER — Other Ambulatory Visit: Payer: Medicare Other

## 2019-02-16 DIAGNOSIS — E782 Mixed hyperlipidemia: Secondary | ICD-10-CM | POA: Diagnosis not present

## 2019-02-16 DIAGNOSIS — G43909 Migraine, unspecified, not intractable, without status migrainosus: Secondary | ICD-10-CM | POA: Diagnosis not present

## 2019-02-16 DIAGNOSIS — H9190 Unspecified hearing loss, unspecified ear: Secondary | ICD-10-CM | POA: Diagnosis not present

## 2019-02-16 DIAGNOSIS — H9209 Otalgia, unspecified ear: Secondary | ICD-10-CM | POA: Diagnosis not present

## 2019-02-16 LAB — ALT: ALT: 51 IU/L — ABNORMAL HIGH (ref 0–44)

## 2019-02-16 LAB — LIPID PANEL
Chol/HDL Ratio: 2.9 ratio (ref 0.0–5.0)
Cholesterol, Total: 96 mg/dL — ABNORMAL LOW (ref 100–199)
HDL: 33 mg/dL — ABNORMAL LOW (ref >39)
LDL Chol Calc (NIH): 41 mg/dL (ref 0–99)
Triglycerides: 123 mg/dL (ref 0–149)
VLDL Cholesterol Cal: 22 mg/dL (ref 5–40)

## 2019-02-17 ENCOUNTER — Encounter: Payer: Self-pay | Admitting: Cardiology

## 2019-02-17 NOTE — Progress Notes (Signed)
Cardiology Office Note:    Date:  02/18/2019   ID:  Sean Willis, DOB 1945-10-03, MRN [id number]  PCP:  [name], MD  Cardiologist:  Fransico Him, MD    Referring MD: [name], MD   Chief Complaint  Patient presents with  . Coronary Artery Disease  . Hypertension  . Cardiomyopathy  . Hyperlipidemia  . Atrial Fibrillation    History of Present Illness:    Sean Willis is a 74 y.o. male with a hx of  CAD status post CABG 4 2013 in setting of MI in [place], [place] with initial PTCA of the OM 2 and subsequent CABG with LIMA to the LAD, SVG to diagonal 1, SVG to OM1/R1 and SVG to distal RCA.Other issues include moderately dilated aortic root ([place]), severe OSA with inability to tolerate CPAP, HTN, HLD, chronic AF with prior cardioversion twice in 2018, on chronic anticoagulation with Xarelto. He has failed to maintain NSR and is managed with rate control and did not wish to be on AAD therapy.   Seen in [place] and was doing ok but had had some indigestion and DOE at times. Wife had just passed with stomach and ovarian cancer. He thought he was depressed. Lexiscan was arranged - this was abnormal - echo also showed decreased EF - cardiac cath was done showing bypass graft failure of the SVG to the RCA along with atresia of the LIMA to the LAD. There is also loss of the the end to side anastomosis of the sequential SVG to the OM.  Severe native LAD disease - felt to best manage medically in light of no ischemia on Myoview. Could consider PCI of the proximal LAD.   He has had multiple med changes - started Losartan, then Entresto and Aldactone. The Delene Loll was stopped due to dizziness and hypotension and was transitioned back to Losartan.    Was seen back by Truitt Merle, NP in Dec 2020 and was doing well.  Plan was for repeat 2D echo in March to reassess LVF.  He is here today for followup and is doing well.  He denies any chest pain or pressure, SOB,  DOE, PND, orthopnea, LE edema, dizziness, palpitations or syncope. He is compliant with his meds and is tolerating meds with no SE.    Past Medical History:  Diagnosis Date  . Benign essential HTN 11/24/2013  . BPH (benign prostatic hyperplasia)   . CAD (coronary artery disease), native coronary artery 04/2011   S/P CABG X4 Sx [place]  . Carotid artery stenosis    1-39% bilateral by dopplers 2018  . Chest wall pain    Right side, muscular,ortho,MRI. U/S abd ok. On lyrica  . Chronic insomnia   . Colon polyp 10/11  . DDD (degenerative disc disease), lumbar   . Dilatation of aorta (HCC)    4.5 cm ascending aortic aneurysm per chets ct 05-18-15 epic  . Dysrhythmia   . ED (erectile dysfunction)   . GERD (gastroesophageal reflux disease)   . Gout   . History of kidney stones   . Kidney stone    uric acid, Dr [name]  renal insufficiency  . Mitral regurgitation 06/04/2016   Moderate by echo 2020  . Mixed hyperlipidemia 01/23/2013  . OSA on CPAP 07/16/2016  . Osteoarthritis    of the knee  . Permanent atrial fibrillation ([place])    s/p DCCV 01/2016.  CHASDS2VASC score is 3    Past Surgical History:  Procedure  Laterality Date  . CARDIOVERSION [name] 01/20/2016   Procedure: CARDIOVERSION;  Surgeon: Thayer Headings, MD;  Location: [facility];  Service: Cardiovascular;  Laterality: [name];  . CARDIOVERSION [name] 03/23/2016   Procedure: CARDIOVERSION;  Surgeon: Sueanne Margarita, MD;  Location: [facility];  Service: Cardiovascular;  Laterality: [name];  . CARDIOVERSION    . CATARACT EXTRACTION W/PHACO Right 10/26/2014   Procedure: CATARACT EXTRACTION PHACO AND INTRAOCULAR LENS PLACEMENT (IOC);  Surgeon: [name], MD;  Location: [facility];  Service: Ophthalmology;  Laterality: Right;  LOT PACK: [id number] H US:01:43.0 AP:27.8 CDE:28.68  . CATARACT EXTRACTION W/PHACO Left 11/13/2016   Procedure: CATARACT EXTRACTION PHACO AND INTRAOCULAR LENS PLACEMENT (IOC);  Surgeon: [name],  MD;  Location: [facility];  Service: Ophthalmology;  Laterality: Left;  [place] 01:00.2 AP% 15.7 CDE 9.47 Fluid Pack lot # [id number]  . COLONOSCOPY WITH PROPOFOL [name] 09/27/2015   Procedure: COLONOSCOPY WITH PROPOFOL;  Surgeon: [name], MD;  Location: [facility];  Service: Endoscopy;  Laterality: [name];  . CORONARY ARTERY BYPASS GRAFT  04/2011   Severe multivessel ASCAD s/p Stemi w PTCA of OM2 then s/p CABG w LIMA to LAD,SVG to OM1 /RI and SVG to Distal RCA   . FINGER SURGERY     left finger for ganglion cyst  . KIDNEY STONE SURGERY    . LEFT HEART CATH AND CORS/GRAFTS ANGIOGRAPHY [name] 12/02/2018   Procedure: LEFT HEART CATH AND CORS/GRAFTS ANGIOGRAPHY;  Surgeon: Belva Crome, MD;  Location: [facility];  Service: Cardiovascular;  Laterality: [name];    Current Medications: Current Meds  Medication Sig  . acetaminophen (TYLENOL) 500 MG tablet Take 1,000 mg by mouth every 8 (eight) hours as needed for moderate pain or headache.  [name] aspirin EC 81 MG tablet Take 162 mg by mouth daily.  [name] atorvastatin (LIPITOR) 80 MG tablet Take 1 tablet (80 mg total) by mouth daily.  . butalbital-acetaminophen-caffeine (FIORICET) 50-325-40 MG tablet Take 1-2 tablets by mouth every 6 (six) hours as needed for headache.  . doxepin (SINEQUAN) 25 MG capsule Take 25 mg by mouth at bedtime.   [name] escitalopram (LEXAPRO) 20 MG tablet Take 10 mg by mouth daily as needed (depression).   [name] GINKGO BILOBA PO Take 1 capsule by mouth daily.  . Homeopathic Products (EARACHE DROPS OT) Place 1-2 drops 3 (three) times daily as needed in ear(s) (for ear pain.).  [name] losartan (COZAAR) 50 MG tablet Take 1 tablet (50 mg total) by mouth daily.  [name] LYSINE PO Take 1 tablet by mouth daily as needed (fever blisters).  . metoprolol tartrate (LOPRESSOR) 50 MG tablet Take 1 tablet (50 mg total) by mouth 2 (two) times daily.  . nitroGLYCERIN (NITROSTAT) 0.4 MG SL tablet Place 1 tablet (0.4 mg total) under the tongue every 5 (five) minutes as  needed for chest pain.  [name] zolpidem (AMBIEN) 5 MG tablet Take 2.5-5 mg by mouth at bedtime as needed for sleep.      Allergies:   Celebrex [celecoxib] and Entresto [sacubitril-valsartan]   Social History   Socioeconomic History  . Marital status: Married    Spouse name: Not on file  . Number of children: Not on file  . Years of education: Not on file  . Highest education level: Not on file  Occupational History  . Not on file  Tobacco Use  . Smoking status: Never Smoker  . Smokeless tobacco: Never Used  Substance and Sexual Activity  . Alcohol use: Yes    Comment:  yes occassionally  . Drug use: No  . Sexual activity: Not on file  Other Topics Concern  . Not on file  Social History Narrative  . Not on file   Social Determinants of Health   Financial Resource Strain:   . Difficulty of Paying Living Expenses: Not on file  Food Insecurity:   . Worried About Charity fundraiser in the Last Year: Not on file  . Ran Out of Food in the Last Year: Not on file  Transportation Needs:   . Lack of Transportation (Medical): Not on file  . Lack of Transportation (Non-Medical): Not on file  Physical Activity:   . Days of Exercise per Week: Not on file  . Minutes of Exercise per Session: Not on file  Stress:   . Feeling of Stress : Not on file  Social Connections:   . Frequency of Communication with Friends and Family: Not on file  . Frequency of Social Gatherings with Friends and Family: Not on file  . Attends Religious Services: Not on file  . Active Member of Clubs or Organizations: Not on file  . Attends Archivist Meetings: Not on file  . Marital Status: Not on file     Family History: The patient's family history includes Alzheimer's disease in his mother; Arrhythmia in his father and sister; CVA in his father; Cancer in his mother.  ROS:   Please see the history of present illness.    ROS  All other systems reviewed and negative.   EKGs/Labs/Other Studies  Reviewed:    The following studies were reviewed today: Cardiac cath, 2D echo, outside labs from [facility]  EKG:  EKG is not ordered today.   Recent Labs: 12/23/2018: BUN 16; Creatinine, Ser 1.24; Hemoglobin 16.3; Platelets 173; Potassium 4.8; Sodium 141 02/16/2019: ALT 51   Recent Lipid Panel    Component Value Date/Time   CHOL 96 (L) 02/16/2019 0758   CHOL 123 01/14/2013 0923   TRIG 123 02/16/2019 0758   TRIG 141 01/14/2013 0923   HDL 33 (L) 02/16/2019 0758   HDL 34 (L) 01/14/2013 0923   CHOLHDL 2.9 02/16/2019 0758   CHOLHDL 5.8 (H) 12/20/2015 0914   VLDL 38 (H) 12/20/2015 0914   LDLCALC 41 02/16/2019 0758   LDLCALC 61 01/14/2013 0923    Physical Exam:    VS:  BP 124/86   Pulse 74   Ht 5' 9.5" (1.765 m)   Wt 187 lb 9.6 oz (85.1 kg)   SpO2 97%   BMI 27.31 kg/m     Wt Readings from Last 3 Encounters:  02/18/19 187 lb 9.6 oz (85.1 kg)  12/23/18 187 lb 6.4 oz (85 kg)  12/03/18 182 lb (82.6 kg)     GEN:  Well nourished, well developed in no acute distress HEENT: Normal NECK: No JVD; No carotid bruits LYMPHATICS: No lymphadenopathy CARDIAC: irregularly irregular, no murmurs, rubs, gallops RESPIRATORY:  Clear to auscultation without rales, wheezing or rhonchi  ABDOMEN: Soft, non-tender, non-distended MUSCULOSKELETAL:  No edema; No deformity  SKIN: Warm and dry NEUROLOGIC:  Alert and oriented x 3 PSYCHIATRIC:  Normal affect   ASSESSMENT:    1. Coronary artery disease involving native coronary artery of native heart without angina pectoris   2. Benign essential HTN   3. Ischemic cardiomyopathy   4. Mixed hyperlipidemia   5. Chronic atrial fibrillation (HCC)   6. OSA (obstructive sleep apnea)   7. Dilatation of aorta (HCC)   8. Secondary hypercoagulable state (  [place])    PLAN:    In order of problems listed above:  1.  ASCAD -status post CABG 4 2013 in setting of MI in [place], [place] with initial PTCA of the OM 2 and subsequent CABG with LIMA to the  LAD, SVG to diagonal 1, SVG to OM1/R1 and SVG to distal RCA. -repeat cath 12/2018 showed bypass graft failure of the SVG to the RCA along with atresia of the LIMA to the LAD. There is also loss of the the end to side anastomosis of the sequential SVG to the OM.  Severe native LAD disease - felt to best manage medically in light of no ischemia on Myoview.  -denies any anginal sx but has some intermittent chest discomfort after eating that goes away with antacids -continue ASA, BB, statin  2.  HTN -BP borderline controlled -DBP is still to high - he brought in readings from home and DBP is consistently in the 90's -add Arlyce Harman 12.5mg  daily and check BP daily at lunch and call with results in 1 week -continue Lopressor 50mg  BID and Losartan 50mg  daily -outside labs from PCP reviewed on [facility] and showed a normal Creatinine at 1.24 and K+ 4.8 last month -check BMET in 1 week  3.  IDCM -2D echo 11/2018 showed moderate LV dysfunction with EF 35-40% with severe LAE, Moderate MR and mild AR.   -no indication for AICD -continue ARB and BB -BP did not tolerate Entresto -he had been on spiro when on Entresto and both stopped for low BP but suspect mainly it was the Entresto -since DBP elevated will add low dose spiro  4.  HLD -LDL goal < 70 -LDL from outside labs from PCP on KPN reviewed showing an LDL of 63 in 2019 but decreased to 41 earlier this month -continue atorvastatin 80mg  daily  5.  Chronic atrial fibrillation -denies any palpitations -continue Lopressor 50mg  BID and Xarelto 20mg  daily  6.  OSA - intolerant to CPAP  7.  Dilated aorta -moderately dilated at 62mm -followed by Dr. [name] -continue statin -BP controlled  8.  Secondary hypercoagulable state/ chronic monitoring for anticoagulation -he denies any bleeding issues -outside labs from PCP on KPN showed Creatinine 1.24 and Hbg 16.3 last month -Calculated CrCl 131ml/min therefore will continue current dose of Xarelto 20mg   daily  Medication Adjustments/Labs and Tests Ordered: Current medicines are reviewed at length with the patient today.  Concerns regarding medicines are outlined above.  No orders of the defined types were placed in this encounter.  No orders of the defined types were placed in this encounter.   Signed, Fransico Him, MD  02/18/2019 9:33 AM    [place]

## 2019-02-18 ENCOUNTER — Encounter: Payer: Self-pay | Admitting: Cardiology

## 2019-02-18 ENCOUNTER — Telehealth: Payer: Self-pay

## 2019-02-18 ENCOUNTER — Ambulatory Visit (INDEPENDENT_AMBULATORY_CARE_PROVIDER_SITE_OTHER): Payer: Medicare Other | Admitting: Cardiology

## 2019-02-18 ENCOUNTER — Other Ambulatory Visit: Payer: Self-pay

## 2019-02-18 VITALS — BP 124/86 | HR 74 | Ht 69.5 in | Wt 187.6 lb

## 2019-02-18 DIAGNOSIS — D6869 Other thrombophilia: Secondary | ICD-10-CM

## 2019-02-18 DIAGNOSIS — I10 Essential (primary) hypertension: Secondary | ICD-10-CM

## 2019-02-18 DIAGNOSIS — E782 Mixed hyperlipidemia: Secondary | ICD-10-CM | POA: Diagnosis not present

## 2019-02-18 DIAGNOSIS — G4733 Obstructive sleep apnea (adult) (pediatric): Secondary | ICD-10-CM | POA: Diagnosis not present

## 2019-02-18 DIAGNOSIS — I77819 Aortic ectasia, unspecified site: Secondary | ICD-10-CM

## 2019-02-18 DIAGNOSIS — I482 Chronic atrial fibrillation, unspecified: Secondary | ICD-10-CM

## 2019-02-18 DIAGNOSIS — I2510 Atherosclerotic heart disease of native coronary artery without angina pectoris: Secondary | ICD-10-CM

## 2019-02-18 DIAGNOSIS — H9012 Conductive hearing loss, unilateral, left ear, with unrestricted hearing on the contralateral side: Secondary | ICD-10-CM | POA: Insufficient documentation

## 2019-02-18 DIAGNOSIS — Z79899 Other long term (current) drug therapy: Secondary | ICD-10-CM

## 2019-02-18 DIAGNOSIS — H6123 Impacted cerumen, bilateral: Secondary | ICD-10-CM | POA: Diagnosis not present

## 2019-02-18 DIAGNOSIS — I255 Ischemic cardiomyopathy: Secondary | ICD-10-CM

## 2019-02-18 MED ORDER — SPIRONOLACTONE 25 MG PO TABS: 12.5000 mg | ORAL_TABLET | Freq: Every day | ORAL | 3 refills | Status: DC

## 2019-02-18 NOTE — Telephone Encounter (Signed)
-----   Message from Sueanne Margarita, MD sent at 02/17/2019  9:32 AM EST ----- Normal lipids  but ALT mildly elevated - please have patient cut out OTC nutritional supplements and repeat LFTs in 4 weeks

## 2019-02-18 NOTE — Patient Instructions (Addendum)
Medication Instructions:  Your physician has recommended you make the following change in your medication:  1) START spironolactone 12.5 mg daily   *If you need a refill on your cardiac medications before your next appointment, please call your pharmacy*  Lab Work: BMET in 1 week - February 24th, 2021  If you have labs (blood work) drawn today and your tests are completely normal, you will receive your results only by: [name] MyChart Message (if you have MyChart) OR . A paper copy in the mail If you have any lab test that is abnormal or we need to change your treatment, we will call you to review the results.  Follow-Up: At [facility], you and your health needs are our priority.  As part of our continuing mission to provide you with exceptional heart care, we have created designated Provider Care Teams.  These Care Teams include your primary Cardiologist (physician) and Advanced Practice Providers (APPs -  Physician Assistants and Nurse Practitioners) who all work together to provide you with the care you need, when you need it.  Your next appointment:   3 month(s)  The format for your next appointment:   In Person  Provider:   Fransico Him, MD  Other Instructions Please take your blood pressure daily at lunch time for one week and call us with your readings.

## 2019-02-25 ENCOUNTER — Other Ambulatory Visit: Payer: Self-pay

## 2019-02-25 ENCOUNTER — Other Ambulatory Visit: Payer: Medicare Other | Admitting: *Deleted

## 2019-02-25 DIAGNOSIS — G4733 Obstructive sleep apnea (adult) (pediatric): Secondary | ICD-10-CM

## 2019-02-25 DIAGNOSIS — I77819 Aortic ectasia, unspecified site: Secondary | ICD-10-CM

## 2019-02-25 DIAGNOSIS — I10 Essential (primary) hypertension: Secondary | ICD-10-CM | POA: Diagnosis not present

## 2019-02-25 DIAGNOSIS — E782 Mixed hyperlipidemia: Secondary | ICD-10-CM | POA: Diagnosis not present

## 2019-02-25 DIAGNOSIS — I255 Ischemic cardiomyopathy: Secondary | ICD-10-CM | POA: Diagnosis not present

## 2019-02-25 DIAGNOSIS — I2510 Atherosclerotic heart disease of native coronary artery without angina pectoris: Secondary | ICD-10-CM

## 2019-02-25 DIAGNOSIS — I482 Chronic atrial fibrillation, unspecified: Secondary | ICD-10-CM

## 2019-02-25 DIAGNOSIS — D6869 Other thrombophilia: Secondary | ICD-10-CM | POA: Diagnosis not present

## 2019-02-25 LAB — BASIC METABOLIC PANEL
BUN/Creatinine Ratio: 16 (ref 10–24)
BUN: 19 mg/dL (ref 8–27)
CO2: 23 mmol/L (ref 20–29)
Calcium: 9 mg/dL (ref 8.6–10.2)
Chloride: 105 mmol/L (ref 96–106)
Creatinine, Ser: 1.2 mg/dL (ref 0.76–1.27)
GFR calc Af Amer: 69 mL/min/{1.73_m2} (ref >59)
GFR calc non Af Amer: 60 mL/min/{1.73_m2} (ref >59)
Glucose: 100 mg/dL — ABNORMAL HIGH (ref 65–99)
Potassium: 4.2 mmol/L (ref 3.5–5.2)
Sodium: 140 mmol/L (ref 134–144)

## 2019-03-02 ENCOUNTER — Telehealth: Payer: Self-pay

## 2019-03-02 DIAGNOSIS — Z79899 Other long term (current) drug therapy: Secondary | ICD-10-CM

## 2019-03-02 MED ORDER — SPIRONOLACTONE 25 MG PO TABS: 25.0000 mg | ORAL_TABLET | Freq: Every day | ORAL | 3 refills | Status: DC

## 2019-03-02 NOTE — Telephone Encounter (Signed)
Per [name]:    Increase spironolactone to 25mg  daily and repeat BMET in 1 week - diastolic BP too high - please repeat BP daily for a week and call with results in 1 weeks   Traci

## 2019-03-04 ENCOUNTER — Telehealth: Payer: Self-pay | Admitting: Cardiology

## 2019-03-04 NOTE — Telephone Encounter (Signed)
New message   Patient would like to know why he is scheduled for lab work? He states that he has already had lab work. Please advise.

## 2019-03-04 NOTE — Telephone Encounter (Signed)
Patient sent over BP readings on Monday 03/01 (see MyChart message).   Spoke with patient who agreed to increase dose of spironolactone. I have changed his lab appointment to 03/11. He will continue to record his BP and send [place] readings in one week. Patient verbalized understanding.

## 2019-03-05 ENCOUNTER — Other Ambulatory Visit: Payer: Self-pay | Admitting: Cardiology

## 2019-03-05 DIAGNOSIS — I6523 Occlusion and stenosis of bilateral carotid arteries: Secondary | ICD-10-CM

## 2019-03-05 DIAGNOSIS — I10 Essential (primary) hypertension: Secondary | ICD-10-CM

## 2019-03-05 DIAGNOSIS — E782 Mixed hyperlipidemia: Secondary | ICD-10-CM

## 2019-03-05 DIAGNOSIS — I2510 Atherosclerotic heart disease of native coronary artery without angina pectoris: Secondary | ICD-10-CM

## 2019-03-05 DIAGNOSIS — G4733 Obstructive sleep apnea (adult) (pediatric): Secondary | ICD-10-CM

## 2019-03-05 DIAGNOSIS — I77819 Aortic ectasia, unspecified site: Secondary | ICD-10-CM

## 2019-03-05 DIAGNOSIS — I4821 Permanent atrial fibrillation: Secondary | ICD-10-CM

## 2019-03-09 ENCOUNTER — Other Ambulatory Visit: Payer: Medicare Other

## 2019-03-12 ENCOUNTER — Other Ambulatory Visit: Payer: Medicare Other

## 2019-03-12 ENCOUNTER — Other Ambulatory Visit: Payer: Self-pay

## 2019-03-12 DIAGNOSIS — E782 Mixed hyperlipidemia: Secondary | ICD-10-CM

## 2019-03-12 DIAGNOSIS — Z79899 Other long term (current) drug therapy: Secondary | ICD-10-CM | POA: Diagnosis not present

## 2019-03-12 LAB — HEPATIC FUNCTION PANEL
ALT: 36 IU/L (ref 0–44)
AST: 32 IU/L (ref 0–40)
Albumin: 4.1 g/dL (ref 3.7–4.7)
Alkaline Phosphatase: 77 IU/L (ref 39–117)
Bilirubin Total: 0.7 mg/dL (ref 0.0–1.2)
Bilirubin, Direct: 0.19 mg/dL (ref 0.00–0.40)
Total Protein: 6.4 g/dL (ref 6.0–8.5)

## 2019-03-12 LAB — BASIC METABOLIC PANEL
BUN/Creatinine Ratio: 11 (ref 10–24)
BUN: 14 mg/dL (ref 8–27)
CO2: 21 mmol/L (ref 20–29)
Calcium: 8.6 mg/dL (ref 8.6–10.2)
Chloride: 105 mmol/L (ref 96–106)
Creatinine, Ser: 1.33 mg/dL — ABNORMAL HIGH (ref 0.76–1.27)
GFR calc Af Amer: 61 mL/min/{1.73_m2} (ref >59)
GFR calc non Af Amer: 53 mL/min/{1.73_m2} — ABNORMAL LOW (ref >59)
Glucose: 100 mg/dL — ABNORMAL HIGH (ref 65–99)
Potassium: 4.1 mmol/L (ref 3.5–5.2)
Sodium: 138 mmol/L (ref 134–144)

## 2019-03-12 NOTE — Addendum Note (Signed)
Addended by: Antonieta Iba on: 03/12/2019 07:50 AM   Modules accepted: Orders

## 2019-03-18 ENCOUNTER — Other Ambulatory Visit: Payer: Medicare Other

## 2019-05-17 NOTE — Progress Notes (Signed)
Cardiology Office Note:    Date:  05/18/2019   ID:  RYLE LORENZINI, DOB [birth year], MRN [id number]  PCP:  [name], MD  Cardiologist:  Fransico Him, MD    Referring MD: [name], MD   Chief Complaint  Patient presents with  . Coronary Artery Disease  . Hypertension  . Cardiomyopathy  . Hyperlipidemia  . Atrial Fibrillation    History of Present Illness:    Sean Willis is a 74 y.o. male with a hx of CAD status post CABG 4 2013 in setting of MI in [place] with initial PTCA of the OM 2 and subsequent CABG with LIMA to the LAD, SVG to diagonal 1, SVG to OM1/R1 and SVG to distal RCA.Other issues include moderately dilated aortic root ([place]), severe OSA with inability to tolerate CPAP, HTN, HLD, chronic AF with prior cardioversion twice in 2018, on chronic anticoagulation with Xarelto. He has failed to maintain NSR and is managed with rate control and did not wish to be on AAD therapy.   Seen in November and was doing ok but had had some indigestion and DOE at times. Wife had just passed with stomach and ovarian cancer. He thought he was depressed. Lexiscan was arranged - this was abnormal -echo also showed decreased EF -cardiac cath was done showing bypass graft failure of the SVG to the RCA along with atresia of the LIMA to the LAD. There is also loss of the the end to side anastomosis of the sequential SVG to the OM. Severe native LAD disease - felt to best manage medically in light of no ischemia on Myoview. Could consider PCI of the proximal LAD.  He has had multiple med changes - started Losartan, then Entresto and Aldactone.The Delene Loll was stopped due to dizziness and hypotension and was transitioned back to Losartan.    Was seen back by Truitt Merle, NP in Dec 2020 and was doing well.  Plan was for repeat 2D echo in March to reassess LVF but this was not done.   He is here today for followup and is doing well.  He will occasionally  get tightness in the upper chest and throat that resolves after taking a baby ASA.  This has not changed in severity or frequency since I saw him last. He has not had to take any NTG.  He denies any SOB, DOE(except walking up a hill), PND, orthopnea, LE edema, dizziness, palpitations or syncope. He is compliant with hia meds and is tolerating meds with no SE.    Past Medical History:  Diagnosis Date  . Benign essential HTN 11/24/2013  . BPH (benign prostatic hyperplasia)   . CAD (coronary artery disease), native coronary artery 04/2011   S/P CABG X4 [place]  . Carotid artery stenosis    1-39% bilateral by dopplers 2018  . Chest wall pain    Right side, muscular,ortho,MRI. U/S abd ok. On lyrica  . Chronic atrial fibrillation ([place])    s/p DCCV 01/2016.  CHASDS2VASC score is 3  . Chronic insomnia   . Colon polyp 10/11  . DDD (degenerative disc disease), lumbar   . Dilatation of aorta (HCC)    4.5 cm ascending aortic aneurysm per chets ct 05-18-15 epic  . Dysrhythmia   . ED (erectile dysfunction)   . GERD (gastroesophageal reflux disease)   . Gout   . History of kidney stones   . Kidney stone    uric acid, Dr [name]  renal  insufficiency  . Mitral regurgitation 06/04/2016   Moderate by echo 2020  . Mixed hyperlipidemia 01/23/2013  . OSA on CPAP 07/16/2016  . Osteoarthritis    of the knee    Past Surgical History:  Procedure Laterality Date  . CARDIOVERSION [name] 01/20/2016   Procedure: CARDIOVERSION;  Surgeon: Thayer Headings, MD;  Location: [facility];  Service: Cardiovascular;  Laterality: [name];  . CARDIOVERSION [name] 03/23/2016   Procedure: CARDIOVERSION;  Surgeon: Sueanne Margarita, MD;  Location: [facility];  Service: Cardiovascular;  Laterality: [name];  . CARDIOVERSION    . CATARACT EXTRACTION W/PHACO Right 10/26/2014   Procedure: CATARACT EXTRACTION PHACO AND INTRAOCULAR LENS PLACEMENT (IOC);  Surgeon: [name], MD;  Location: [facility];  Service:  Ophthalmology;  Laterality: Right;  LOT PACK: [id number] H US:01:43.0 AP:27.8 CDE:28.68  . CATARACT EXTRACTION W/PHACO Left 11/13/2016   Procedure: CATARACT EXTRACTION PHACO AND INTRAOCULAR LENS PLACEMENT (IOC);  Surgeon: [name], MD;  Location: [facility];  Service: Ophthalmology;  Laterality: Left;  [place] 01:00.2 AP% 15.7 CDE 9.47 Fluid Pack lot # [id number]  . COLONOSCOPY WITH PROPOFOL [name] 09/27/2015   Procedure: COLONOSCOPY WITH PROPOFOL;  Surgeon: [name], MD;  Location: [facility];  Service: Endoscopy;  Laterality: [name];  . CORONARY ARTERY BYPASS GRAFT  04/2011   Severe multivessel ASCAD s/p Stemi w PTCA of OM2 then s/p CABG w LIMA to LAD,SVG to OM1 /RI and SVG to Distal RCA   . FINGER SURGERY     left finger for ganglion cyst  . KIDNEY STONE SURGERY    . LEFT HEART CATH AND CORS/GRAFTS ANGIOGRAPHY [name] 12/02/2018   Procedure: LEFT HEART CATH AND CORS/GRAFTS ANGIOGRAPHY;  Surgeon: Belva Crome, MD;  Location: [facility];  Service: Cardiovascular;  Laterality: [name];    Current Medications: Current Meds  Medication Sig  . acetaminophen (TYLENOL) 500 MG tablet Take 1,000 mg by mouth every 8 (eight) hours as needed for moderate pain or headache.  [name] aspirin EC 81 MG tablet Take 162 mg by mouth daily.  [name] atorvastatin (LIPITOR) 80 MG tablet Take 1 tablet (80 mg total) by mouth daily.  . butalbital-acetaminophen-caffeine (FIORICET) 50-325-40 MG tablet Take 1-2 tablets by mouth every 6 (six) hours as needed for headache.  . doxepin (SINEQUAN) 25 MG capsule Take 25 mg by mouth at bedtime.   [name] escitalopram (LEXAPRO) 20 MG tablet Take 10 mg by mouth daily as needed (depression).   . Homeopathic Products (EARACHE DROPS OT) Place 1-2 drops 3 (three) times daily as needed in ear(s) (for ear pain.).  [name] losartan (COZAAR) 25 MG tablet Take 25 mg by mouth daily.  [name] LYSINE PO Take 1 tablet by mouth daily as needed (fever blisters).  . metoprolol tartrate (LOPRESSOR) 50 MG tablet Take  1 tablet (50 mg total) by mouth 2 (two) times daily.  . nitroGLYCERIN (NITROSTAT) 0.4 MG SL tablet Place 1 tablet (0.4 mg total) under the tongue every 5 (five) minutes as needed for chest pain.  . rivaroxaban (XARELTO) 20 MG TABS tablet Take 20 mg by mouth daily with supper.  [name] spironolactone (ALDACTONE) 25 MG tablet Take 1 tablet (25 mg total) by mouth daily.  [name] zolpidem (AMBIEN) 5 MG tablet Take 2.5-5 mg by mouth at bedtime as needed for sleep.   . [DISCONTINUED] losartan (COZAAR) 50 MG tablet Take 1 tablet (50 mg total) by mouth daily.     Allergies:   Celebrex [celecoxib] and Entresto [sacubitril-valsartan]   Social History   Socioeconomic  History  . Marital status: Married    Spouse name: Not on file  . Number of children: Not on file  . Years of education: Not on file  . Highest education level: Not on file  Occupational History  . Not on file  Tobacco Use  . Smoking status: Never Smoker  . Smokeless tobacco: Never Used  Substance and Sexual Activity  . Alcohol use: Yes    Comment: yes occassionally  . Drug use: No  . Sexual activity: Not on file  Other Topics Concern  . Not on file  Social History Narrative  . Not on file   Social Determinants of Health   Financial Resource Strain:   . Difficulty of Paying Living Expenses:   Food Insecurity:   . Worried About Charity fundraiser in the Last Year:   . Arboriculturist in the Last Year:   Transportation Needs:   . Film/video editor (Medical):   [name] Lack of Transportation (Non-Medical):   Physical Activity:   . Days of Exercise per Week:   . Minutes of Exercise per Session:   Stress:   . Feeling of Stress :   Social Connections:   . Frequency of Communication with Friends and Family:   . Frequency of Social Gatherings with Friends and Family:   . Attends Religious Services:   . Active Member of Clubs or Organizations:   . Attends Archivist Meetings:   [name] Marital Status:      Family  History: The patient's family history includes Alzheimer's disease in his mother; Arrhythmia in his father and sister; CVA in his father; Cancer in his mother.  ROS:   Please see the history of present illness.    ROS  All other systems reviewed and negative.   EKGs/Labs/Other Studies Reviewed:    The following studies were reviewed today: none  EKG:  EKG is not ordered today.    Recent Labs: 12/23/2018: Hemoglobin 16.3; Platelets 173 03/12/2019: ALT 36; BUN 14; Creatinine, Ser 1.33; Potassium 4.1; Sodium 138   Recent Lipid Panel    Component Value Date/Time   CHOL 96 (L) 02/16/2019 0758   CHOL 123 01/14/2013 0923   TRIG 123 02/16/2019 0758   TRIG 141 01/14/2013 0923   HDL 33 (L) 02/16/2019 0758   HDL 34 (L) 01/14/2013 0923   CHOLHDL 2.9 02/16/2019 0758   CHOLHDL 5.8 (H) 12/20/2015 0914   VLDL 38 (H) 12/20/2015 0914   LDLCALC 41 02/16/2019 0758   LDLCALC 61 01/14/2013 0923    Physical Exam:    VS:  BP (!) 124/92   Pulse 64   Ht 5' 9.5" (1.765 m)   Wt 187 lb 9.6 oz (85.1 kg)   SpO2 97%   BMI 27.31 kg/m     Wt Readings from Last 3 Encounters:  05/18/19 187 lb 9.6 oz (85.1 kg)  02/18/19 187 lb 9.6 oz (85.1 kg)  12/23/18 187 lb 6.4 oz (85 kg)     GEN:  Well nourished, well developed in no acute distress HEENT: Normal NECK: No JVD; No carotid bruits LYMPHATICS: No lymphadenopathy CARDIAC: RRR, no murmurs, rubs, gallops RESPIRATORY:  Clear to auscultation without rales, wheezing or rhonchi  ABDOMEN: Soft, non-tender, non-distended MUSCULOSKELETAL:  No edema; No deformity  SKIN: Warm and dry NEUROLOGIC:  Alert and oriented x 3 PSYCHIATRIC:  Normal affect   ASSESSMENT:    1. Coronary artery disease involving native coronary artery of native heart without angina pectoris  2. Benign essential HTN   3. Ischemic cardiomyopathy   4. Mixed hyperlipidemia   5. Persistent atrial fibrillation ([place])   6. OSA (obstructive sleep apnea)   7. Dilatation of aorta (HCC)     PLAN:    In order of problems listed above:  1.  ASCAD -status post CABG 4 2013 in setting of MI in [place] with initial PTCA of the OM 2 and subsequent CABG with LIMA to the LAD, SVG to diagonal 1, SVG to OM1/R1 and SVG to distal RCA. -repeat cath 12/2018 showed bypass graft failure of the SVG to the RCA along with atresia of the LIMA to the LAD. There is also loss of the the end to side anastomosis of the sequential SVG to the OM. Severe native LAD disease - felt to best manage medically in light of no ischemia on Myoview.  -he has chronic chest burning that goes away with ASA - he is not sure if it is indigestion or angina but he says that it goes away immediately with ASA -no ischemia on nuclear stress test 11/2018 -start Imdur 15mg  daily and followup with PA in 4 weeks -continue ASA, statin and BB  2.  HTN -BP borderline controlled today -continue spiro 25mg  daily, Lopressor 50mg  BID and Losartan 25mg  daily  3.  IDCM -2D echo 11/2018 showed moderate LV dysfunction with EF 35-40% with severe LAE, Moderate MR and mild AR.   -no indication for AICD -BP did not tolerate Entresto -continue Spiro 25mg  daily, Losartan 25mg  daily and Lopressor 50mg  BID  4.  HLD -LDL goal < 70 -LDL 41 in Feb 2021 -followed by PCP -continue atorvastatin 80mg  daily  5.  Chronic atrial fibrillation -HR well controlled -denies any palpitations -denies any bleeding on DOAC -continue Lopressor 50mg  BID and Xarelto 20mg  daily -CrCl 50.27 in March 2021  6.  OSA - intolerant to CPAP  7.  Dilated aorta -moderately dilated at 41mm -followed by Dr. [name] -continue statin -BP controlled   Medication Adjustments/Labs and Tests Ordered: Current medicines are reviewed at length with the patient today.  Concerns regarding medicines are outlined above.  No orders of the defined types were placed in this encounter.  No orders of the defined types were placed in this  encounter.   Signed, Fransico Him, MD  05/18/2019 9:34 AM    [place]

## 2019-05-18 ENCOUNTER — Encounter: Payer: Self-pay | Admitting: Cardiology

## 2019-05-18 ENCOUNTER — Ambulatory Visit (INDEPENDENT_AMBULATORY_CARE_PROVIDER_SITE_OTHER): Payer: Medicare Other | Admitting: Cardiology

## 2019-05-18 ENCOUNTER — Other Ambulatory Visit: Payer: Self-pay

## 2019-05-18 VITALS — BP 124/92 | HR 64 | Ht 69.5 in | Wt 187.6 lb

## 2019-05-18 DIAGNOSIS — I10 Essential (primary) hypertension: Secondary | ICD-10-CM

## 2019-05-18 DIAGNOSIS — E782 Mixed hyperlipidemia: Secondary | ICD-10-CM

## 2019-05-18 DIAGNOSIS — I4819 Other persistent atrial fibrillation: Secondary | ICD-10-CM

## 2019-05-18 DIAGNOSIS — I255 Ischemic cardiomyopathy: Secondary | ICD-10-CM | POA: Diagnosis not present

## 2019-05-18 DIAGNOSIS — I77819 Aortic ectasia, unspecified site: Secondary | ICD-10-CM | POA: Diagnosis not present

## 2019-05-18 DIAGNOSIS — I2510 Atherosclerotic heart disease of native coronary artery without angina pectoris: Secondary | ICD-10-CM

## 2019-05-18 DIAGNOSIS — G4733 Obstructive sleep apnea (adult) (pediatric): Secondary | ICD-10-CM | POA: Diagnosis not present

## 2019-05-18 MED ORDER — ISOSORBIDE MONONITRATE ER 30 MG PO TB24: 15.0000 mg | ORAL_TABLET | Freq: Every day | ORAL | 3 refills | Status: DC

## 2019-05-18 NOTE — Patient Instructions (Signed)
Medication Instructions:  Your physician has recommended you make the following change in your medication:  1) START taking Imdur (isosorbide mononitrate) 1/2 tablet (15 mg) daily  *If you need a refill on your cardiac medications before your next appointment, please call your pharmacy*  Follow-Up: At [facility], you and your health needs are our priority.  As part of our continuing mission to provide you with exceptional heart care, we have created designated Provider Care Teams.  These Care Teams include your primary Cardiologist (physician) and Advanced Practice Providers (APPs -  Physician Assistants and Nurse Practitioners) who all work together to provide you with the care you need, when you need it.    Your next appointment:   4 week(s)  The format for your next appointment:   In Person  Provider:   Melina Copa, PA-C or Ermalinda Barrios, PA-C  Follow up with [name] in 6 months.

## 2019-05-20 ENCOUNTER — Other Ambulatory Visit: Payer: Self-pay | Admitting: Surgery

## 2019-05-20 DIAGNOSIS — I712 Thoracic aortic aneurysm, without rupture, unspecified: Secondary | ICD-10-CM

## 2019-05-27 ENCOUNTER — Other Ambulatory Visit: Payer: Self-pay | Admitting: Cardiology

## 2019-05-28 NOTE — Telephone Encounter (Signed)
Pt last saw Dr Radford Pax 05/18/19, last labs 03/12/19 Creat 1.33, age 74, weight 85.1kg, CrCl 59.54, based on CrCl pt is on appropriate dosage of Xarelto 20mg  QD.  Will refill rx.

## 2019-05-29 DIAGNOSIS — Z961 Presence of intraocular lens: Secondary | ICD-10-CM | POA: Diagnosis not present

## 2019-05-29 DIAGNOSIS — H26491 Other secondary cataract, right eye: Secondary | ICD-10-CM | POA: Diagnosis not present

## 2019-06-01 ENCOUNTER — Other Ambulatory Visit: Payer: Self-pay | Admitting: Cardiology

## 2019-06-12 ENCOUNTER — Encounter: Payer: Self-pay | Admitting: Physician Assistant

## 2019-06-12 NOTE — Progress Notes (Signed)
Cardiology Office Note    Date:  06/22/2019   ID:  Sean Willis, DOB Mar 16, 1945, MRN [id number]  PCP:  [name], MD  Cardiologist:  Fransico Him, MD  Electrophysiologist:  None   Chief Complaint: f/u chest discomfort  History of Present Illness:   Sean Willis is a 74 y.o. male with history of CAD (s/p CABG x 4 2013 in setting of MI in [place], [place] with initial PTCA of the OM 2 and subsequent CABG with LIMA to the LAD, SVG to diagonal 1, SVG to OM1/R1 and SVG to distal RCA), aneurysmal disease of the ascending aorta and aortic root (followed by Dr. [name]), severe OSA with inability to tolerate CPAP, HTN, HLD, chronic AF, CKD stage II-III by labs who presents for follow-up. Regarding atrial fib, he had prior cardioversion but failed to maintain NSR. He did not wish to be on AAD, so has been managed with rate control.   He was seen in November 2020 with some indigestion and DOE at times. Wife had just passed with stomach and ovarian cancer. He thought he was depressed. Lexiscan was arranged which was abnormal. 2D echo also showed decreased EF 35-40%, moderately dilated LV, diffuse HK, normal RV, severe LAE, moderate MR, mild AI, moderate dilation of aortic root (42mm), mildly elevated PASP. Cardiac cath was done 12/02/18 showing bypass graft failure of the SVG to the RCA along with atresia of the LIMA to the LAD. There was also loss of the the end to side anastomosis of the sequential SVG to the OM. He had severe native LAD disease. It was felt to best manage medically in light of no ischemia on Myoview, could consider PCI of the proximal LAD. He had multiple med changes. He was started Losartan then Entresto and Aldactone. The Delene Loll was stopped due to dizziness and hypotension and was transitioned back to Losartan. He saw [name] back in follow-up 05/18/19 and reported occasional tightness in the upper chest and throat that specifically typically resolved after  taking a baby aspirin. [name] recommended to start Imdur 15mg  daily with close follow-up. Last labs 03/2019 showed K 4.1, Cr 1.33, LFTs wnl, 02/2019 LDL 41, trig 123, 12/2018 normal CBC.  He is seen back today in follow-up. Since starting the Imdur he has not had to take any additional ASA for any chest pain episodes. He is walking 10-12,000 steps per day. No dyspnea. He does notice the Imdur gives him a slight headache so sometimes he takes 1/3 of a tab. He also self-reduced his atorvastatin about a month ago to 1/2 tab daily due to some muscle aches in his foot. He reports the same thing happened in the past on pravastatin. His foot pain has improved with this change. No ulcers, sores or claudication noted. No bleeding reported.   Past Medical History:  Diagnosis Date  . Benign essential HTN 11/24/2013  . BPH (benign prostatic hyperplasia)   . CAD (coronary artery disease), native coronary artery 04/2011   S/P CABG X4 Sx [place]  . Carotid artery stenosis    1-39% bilateral by dopplers 2018  . Chest wall pain    Right side, muscular,ortho,MRI. U/S abd ok. On lyrica  . Chronic atrial fibrillation ([place])    s/p DCCV 01/2016, subsequent reversion, patient denied AAD  . Chronic insomnia   . Colon polyp 10/11  . DDD (degenerative disc disease), lumbar   . Dilatation of aorta ([facility])    4.5 cm ascending  aortic aneurysm per chets ct 05-18-15 epic  . ED (erectile dysfunction)   . GERD (gastroesophageal reflux disease)   . Gout   . History of kidney stones   . Kidney stone    uric acid, Dr [name]  renal insufficiency  . Mitral regurgitation 06/04/2016   Moderate by echo 2020  . Mixed hyperlipidemia 01/23/2013  . OSA on CPAP 07/16/2016  . Osteoarthritis    of the knee    Past Surgical History:  Procedure Laterality Date  . CARDIOVERSION [name] 01/20/2016   Procedure: CARDIOVERSION;  Surgeon: Thayer Headings, MD;  Location: [facility];  Service: Cardiovascular;  Laterality: [name];    . CARDIOVERSION [name] 03/23/2016   Procedure: CARDIOVERSION;  Surgeon: Sueanne Margarita, MD;  Location: [facility];  Service: Cardiovascular;  Laterality: [name];  . CARDIOVERSION    . CATARACT EXTRACTION W/PHACO Right 10/26/2014   Procedure: CATARACT EXTRACTION PHACO AND INTRAOCULAR LENS PLACEMENT (IOC);  Surgeon: [name], MD;  Location: [facility];  Service: Ophthalmology;  Laterality: Right;  LOT PACK: [id number] H US:01:43.0 AP:27.8 CDE:28.68  . CATARACT EXTRACTION W/PHACO Left 11/13/2016   Procedure: CATARACT EXTRACTION PHACO AND INTRAOCULAR LENS PLACEMENT (IOC);  Surgeon: [name], MD;  Location: [facility];  Service: Ophthalmology;  Laterality: Left;  [place] 01:00.2 AP% 15.7 CDE 9.47 Fluid Pack lot # [id number]  . COLONOSCOPY WITH PROPOFOL [name] 09/27/2015   Procedure: COLONOSCOPY WITH PROPOFOL;  Surgeon: [name], MD;  Location: [facility];  Service: Endoscopy;  Laterality: [name];  . CORONARY ARTERY BYPASS GRAFT  04/2011   Severe multivessel ASCAD s/p Stemi w PTCA of OM2 then s/p CABG w LIMA to LAD,SVG to OM1 /RI and SVG to Distal RCA   . FINGER SURGERY     left finger for ganglion cyst  . KIDNEY STONE SURGERY    . LEFT HEART CATH AND CORS/GRAFTS ANGIOGRAPHY [name] 12/02/2018   Procedure: LEFT HEART CATH AND CORS/GRAFTS ANGIOGRAPHY;  Surgeon: Belva Crome, MD;  Location: [facility];  Service: Cardiovascular;  Laterality: [name];    Current Medications: Current Meds  Medication Sig  . acetaminophen (TYLENOL) 500 MG tablet Take 1,000 mg by mouth every 8 (eight) hours as needed for moderate pain or headache.  [name] aspirin EC 81 MG tablet Take 81 mg by mouth daily.   . butalbital-acetaminophen-caffeine (FIORICET) 50-325-40 MG tablet Take 1-2 tablets by mouth every 6 (six) hours as needed for headache.  . doxepin (SINEQUAN) 25 MG capsule Take 25 mg by mouth at bedtime.   [name] escitalopram (LEXAPRO) 20 MG tablet Take 10 mg by mouth daily as needed (depression).   . Homeopathic  Products (EARACHE DROPS OT) Place 1-2 drops 3 (three) times daily as needed in ear(s) (for ear pain.).  [name] isosorbide mononitrate (IMDUR) 30 MG 24 hr tablet Take 0.5 tablets (15 mg total) by mouth daily.  [name] losartan (COZAAR) 25 MG tablet Take 12.5 mg by mouth daily.  [name] LYSINE PO Take 1 tablet by mouth daily as needed (fever blisters).  . metoprolol tartrate (LOPRESSOR) 50 MG tablet Take 1 tablet (50 mg total) by mouth 2 (two) times daily.  [name] spironolactone (ALDACTONE) 25 MG tablet Take 1 tablet (25 mg total) by mouth daily.  Alveda Reasons 20 MG TABS tablet TAKE 1 TABLET (20 MG TOTAL) BY MOUTH DAILY WITH SUPPER.  [name] zolpidem (AMBIEN) 5 MG tablet Take 2.5-5 mg by mouth at bedtime as needed for sleep.   . [DISCONTINUED] atorvastatin (LIPITOR) 80 MG tablet Take 1 tablet (  80 mg total) by mouth daily.  . [DISCONTINUED] losartan (COZAAR) 25 MG tablet TAKE 1 TABLET BY MOUTH EVERY DAY (Patient taking differently: 12.5 mg. )      Allergies:   Celebrex [celecoxib] and Entresto [sacubitril-valsartan]   Social History   Socioeconomic History  . Marital status: Married    Spouse name: Not on file  . Number of children: Not on file  . Years of education: Not on file  . Highest education level: Not on file  Occupational History  . Not on file  Tobacco Use  . Smoking status: Never Smoker  . Smokeless tobacco: Never Used  Substance and Sexual Activity  . Alcohol use: Yes    Comment: yes occassionally  . Drug use: No  . Sexual activity: Not on file  Other Topics Concern  . Not on file  Social History Narrative  . Not on file   Social Determinants of Health   Financial Resource Strain:   . Difficulty of Paying Living Expenses:   Food Insecurity:   . Worried About Charity fundraiser in the Last Year:   . Arboriculturist in the Last Year:   Transportation Needs:   . Film/video editor (Medical):   [name] Lack of Transportation (Non-Medical):   Physical Activity:   . Days of Exercise per Week:     . Minutes of Exercise per Session:   Stress:   . Feeling of Stress :   Social Connections:   . Frequency of Communication with Friends and Family:   . Frequency of Social Gatherings with Friends and Family:   . Attends Religious Services:   . Active Member of Clubs or Organizations:   . Attends Archivist Meetings:   [name] Marital Status:      Family History:  The patient's family history includes Alzheimer's disease in his mother; Arrhythmia in his father and sister; CVA in his father; Cancer in his mother.  ROS:   Please see the history of present illness.  All other systems are reviewed and otherwise negative.    EKGs/Labs/Other Studies Reviewed:    Studies reviewed are outlined and summarized above. Reports included below if pertinent.  LHC 12/2018  Bypass graft failure with occlusion of the saphenous vein graft to the right coronary and atresia internal mammary graft to the LAD.  There is also loss of the end-to-side anastomosis of the sequential graft to the obtuse marginal.  Patent saphenous vein graft to the diagonal.  Patent bypass graft to the ramus intermedius  Widely patent left main  Severely diseased proximal to distal LAD with segmental 60 to 80% proximal to mid followed by diffuse disease up to 90% in the mid to distal vessel.  A potential target for PCI could be the proximal segment.  The segment overlaps the diagonal which is severely diseased but supplied by vein graft.  Totally occluded ramus intermedius  Totally occluded dominant obtuse marginal  Widely patent but diffusely diseased right coronary from proximal to distal and there is 70 to 80% stenosis in a moderate-sized second left ventricular branch.  LVEDP is normal.  There is severe mid anterior wall hypokinesis.  EF is approximately 35 to 40%.  RECOMMENDATION:   In absence of anterior wall ischemia, no treatment on the LAD was applied.  If symptoms persist consideration of proximal  LAD stent via femoral approach could be contemplated.  This however will still leave severe diffuse mid and distal LAD disease.  Guideline directed  therapy for LV systolic dysfunction.  Aggressive secondary prevention for coronary atherosclerosis.  2D echo 11/2018  1. Left ventricular ejection fraction, by visual estimation, is 35 to  40%. The left ventricle has moderately decreased function. Left  ventricular septal wall thickness was normal. There is no left ventricular  hypertrophy.  2. Left ventricular diastolic parameters are indeterminate.  3. Moderately dilated left ventricular internal cavity size.  4. Diffuse hypokinesis.  5. Global right ventricle has normal systolic function.The right  ventricular size is normal. No increase in right ventricular wall  thickness.  6. Left atrial size was severely dilated.  7. Right atrial size was normal.  8. The mitral valve is normal in structure. Moderate mitral valve  regurgitation.  9. The tricuspid valve is normal in structure. Tricuspid valve  regurgitation is mild.  10. The aortic valve is tricuspid. Aortic valve regurgitation is mild.  Mild to moderate aortic valve sclerosis/calcification without any evidence  of aortic stenosis.  11. The pulmonic valve was grossly normal. Pulmonic valve regurgitation is  mild.  12. Aortic dilatation noted.  13. There is moderate dilatation of the aortic root measuring 45 mm.  14. Mildly elevated pulmonary artery systolic pressure.     EKG:  EKG is not ordered today  Recent Labs: 12/23/2018: Hemoglobin 16.3; Platelets 173 03/12/2019: ALT 36; BUN 14; Creatinine, Ser 1.33; Potassium 4.1; Sodium 138  Recent Lipid Panel    Component Value Date/Time   CHOL 96 (L) 02/16/2019 0758   CHOL 123 01/14/2013 0923   TRIG 123 02/16/2019 0758   TRIG 141 01/14/2013 0923   HDL 33 (L) 02/16/2019 0758   HDL 34 (L) 01/14/2013 0923   CHOLHDL 2.9 02/16/2019 0758   CHOLHDL 5.8 (H) 12/20/2015 0914     VLDL 38 (H) 12/20/2015 0914   LDLCALC 41 02/16/2019 0758   LDLCALC 61 01/14/2013 0923    PHYSICAL EXAM:    VS:  BP 110/68   Pulse 79   Ht 5' 9.5" (1.765 m)   Wt 188 lb (85.3 kg)   SpO2 97%   BMI 27.36 kg/m   BMI: Body mass index is 27.36 kg/m.  GEN: Well nourished, well developed WM, in no acute distress HEENT: normocephalic, atraumatic Neck: no JVD, carotid bruits, or masses Cardiac: irregularly irregular, rate controlled; no murmurs, rubs, or gallops, no edema, excellent pedal pulses, no distal ulcers noted Respiratory:  clear to auscultation bilaterally, normal work of breathing GI: soft, nontender, nondistended, + BS MS: no deformity or atrophy Skin: warm and dry, no rash Neuro:  Alert and Oriented x 3, Strength and sensation are intact, follows commands Psych: euthymic mood, full affect  Wt Readings from Last 3 Encounters:  06/22/19 188 lb (85.3 kg)  05/18/19 187 lb 9.6 oz (85.1 kg)  02/18/19 187 lb 9.6 oz (85.1 kg)     ASSESSMENT & PLAN:   1. Precordial pain with history of CAD - improved with Imdur. We discussed trialing this at night instead due to mild headache, but he prefers to continue taking it how he is doing so now - he feels he's found a happy medium of his symptoms. Continue present regimen otherwise as outlined today. He is no longer having to take extra ASA, only 81mg  as prescribed. Discussed observation for bleeding given dual blood thinner therapy. 2. TAA - has f/u scan scheduled with Dr. [name].  3. Essential HTN - controlled on present regimen. 4. Hyperlipidemia - he self-reduced atorvastatin due to h/o discomfort in toes related to  this. (States it feels like gout, but it's been ruled out in the past.) The toe discomfort has improved on lower dose. Continue 1/2 tablet for now - he declined rx at this time. We'll recheck CMET/lipids in 1 month to reassess LDL trend. LDL was previously well controlled on 80mg  dose. 5. Chronic atrial fib - CrCl remains  appropriate for anticoagulation dose. No bleeding reported. Rate controlled. Asymptomatic. Continue rate control strategy.  Disposition: F/u with [name] in 6 months.  Medication Adjustments/Labs and Tests Ordered: Current medicines are reviewed at length with the patient today.  Concerns regarding medicines are outlined above. Medication changes, Labs and Tests ordered today are summarized above and listed in the Patient Instructions accessible in Encounters.   Signed, Charlie Pitter, PA-C  06/22/2019 9:16 AM    [facility] [place], [place], [place]  [address] Phone: [phone]; Fax: [phone]

## 2019-06-22 ENCOUNTER — Encounter: Payer: Self-pay | Admitting: Physician Assistant

## 2019-06-22 ENCOUNTER — Other Ambulatory Visit: Payer: Self-pay

## 2019-06-22 ENCOUNTER — Ambulatory Visit (INDEPENDENT_AMBULATORY_CARE_PROVIDER_SITE_OTHER): Payer: Medicare Other | Admitting: Physician Assistant

## 2019-06-22 VITALS — BP 110/68 | HR 79 | Ht 69.5 in | Wt 188.0 lb

## 2019-06-22 DIAGNOSIS — I712 Thoracic aortic aneurysm, without rupture, unspecified: Secondary | ICD-10-CM

## 2019-06-22 DIAGNOSIS — E785 Hyperlipidemia, unspecified: Secondary | ICD-10-CM | POA: Diagnosis not present

## 2019-06-22 DIAGNOSIS — I482 Chronic atrial fibrillation, unspecified: Secondary | ICD-10-CM

## 2019-06-22 DIAGNOSIS — R072 Precordial pain: Secondary | ICD-10-CM

## 2019-06-22 DIAGNOSIS — I10 Essential (primary) hypertension: Secondary | ICD-10-CM

## 2019-06-22 DIAGNOSIS — I255 Ischemic cardiomyopathy: Secondary | ICD-10-CM | POA: Diagnosis not present

## 2019-06-22 NOTE — Patient Instructions (Addendum)
Medication Instructions:  Your physician recommends that you continue on your current medications as directed. Please refer to the Current Medication list given to you today.  *If you need a refill on your cardiac medications before your next appointment, please call your pharmacy*   Lab Work: 07/20/2019:  COME TO THE OFFICE FOR CMET & LIPID PANEL. YOU MAY COME ANYTIME AFTER 7:30 IN THE A.M. JUST BE SURE TO FAST, NOTHING TO EAT OR DRINK AFTER MIDNIGHT THE NIGHT BEFORE  If you have labs (blood work) drawn today and your tests are completely normal, you will receive your results only by: [name] MyChart Message (if you have MyChart) OR . A paper copy in the mail If you have any lab test that is abnormal or we need to change your treatment, we will call you to review the results.   Testing/Procedures: None ordered   Follow-Up: At [facility], you and your health needs are our priority.  As part of our continuing mission to provide you with exceptional heart care, we have created designated Provider Care Teams.  These Care Teams include your primary Cardiologist (physician) and Advanced Practice Providers (APPs -  Physician Assistants and Nurse Practitioners) who all work together to provide you with the care you need, when you need it.  We recommend signing up for the patient portal called "MyChart".  Sign up information is provided on this After Visit Summary.  MyChart is used to connect with patients for Virtual Visits (Telemedicine).  Patients are able to view lab/test results, encounter notes, upcoming appointments, etc.  Non-urgent messages can be sent to your provider as well.   To learn more about what you can do with MyChart, go to [email or web address].    Your next appointment:   6 month(s)  The format for your next appointment:   In Person  Provider:   You may see Fransico Him, MD or one of the following Advanced Practice Providers on your designated Care Team:    Melina Copa,  PA-C  Ermalinda Barrios, PA-C    Other Instructions

## 2019-06-29 ENCOUNTER — Other Ambulatory Visit: Payer: Medicare Other | Admitting: *Deleted

## 2019-06-29 ENCOUNTER — Other Ambulatory Visit: Payer: Self-pay

## 2019-06-29 DIAGNOSIS — I10 Essential (primary) hypertension: Secondary | ICD-10-CM

## 2019-06-29 DIAGNOSIS — I712 Thoracic aortic aneurysm, without rupture, unspecified: Secondary | ICD-10-CM

## 2019-06-29 DIAGNOSIS — R072 Precordial pain: Secondary | ICD-10-CM

## 2019-06-29 DIAGNOSIS — I482 Chronic atrial fibrillation, unspecified: Secondary | ICD-10-CM

## 2019-06-29 DIAGNOSIS — E785 Hyperlipidemia, unspecified: Secondary | ICD-10-CM

## 2019-06-29 LAB — COMPREHENSIVE METABOLIC PANEL
ALT: 30 IU/L (ref 0–44)
AST: 27 IU/L (ref 0–40)
Albumin/Globulin Ratio: 1.5 (ref 1.2–2.2)
Albumin: 4.2 g/dL (ref 3.7–4.7)
Alkaline Phosphatase: 96 IU/L (ref 48–121)
BUN/Creatinine Ratio: 13 (ref 10–24)
BUN: 17 mg/dL (ref 8–27)
Bilirubin Total: 0.5 mg/dL (ref 0.0–1.2)
CO2: 21 mmol/L (ref 20–29)
Calcium: 9.2 mg/dL (ref 8.6–10.2)
Chloride: 105 mmol/L (ref 96–106)
Creatinine, Ser: 1.28 mg/dL — ABNORMAL HIGH (ref 0.76–1.27)
GFR calc Af Amer: 64 mL/min/{1.73_m2} (ref >59)
GFR calc non Af Amer: 55 mL/min/{1.73_m2} — ABNORMAL LOW (ref >59)
Globulin, Total: 2.8 g/dL (ref 1.5–4.5)
Glucose: 114 mg/dL — ABNORMAL HIGH (ref 65–99)
Potassium: 4.1 mmol/L (ref 3.5–5.2)
Sodium: 140 mmol/L (ref 134–144)
Total Protein: 7 g/dL (ref 6.0–8.5)

## 2019-06-29 LAB — LIPID PANEL
Chol/HDL Ratio: 4.1 ratio (ref 0.0–5.0)
Cholesterol, Total: 128 mg/dL (ref 100–199)
HDL: 31 mg/dL — ABNORMAL LOW (ref >39)
LDL Chol Calc (NIH): 70 mg/dL (ref 0–99)
Triglycerides: 153 mg/dL — ABNORMAL HIGH (ref 0–149)
VLDL Cholesterol Cal: 27 mg/dL (ref 5–40)

## 2019-07-01 ENCOUNTER — Ambulatory Visit
Admission: RE | Admit: 2019-07-01 | Discharge: 2019-07-01 | Disposition: A | Discharge status: Home / Self Care | Payer: Medicare Other | Source: Ambulatory Visit | Attending: Surgery | Admitting: Surgery

## 2019-07-01 ENCOUNTER — Ambulatory Visit: Payer: Medicare Other | Admitting: Surgery

## 2019-07-01 DIAGNOSIS — I712 Thoracic aortic aneurysm, without rupture, unspecified: Secondary | ICD-10-CM

## 2019-07-01 IMAGING — CT CT ANGIO CHEST
2 of 6 series · 13 of 36 positions shown · IV contrast (iopamidol)
Comparison: [DATE], [DATE], most remote comparison
[DATE]

CLINICAL DATA: 74-year-old male with a history of thoracic aortic
aneurysm

EXAM:
CT ANGIOGRAPHY CHEST WITH CONTRAST
TECHNIQUE: Multidetector CT imaging of the chest was performed using the
standard protocol during bolus administration of intravenous
contrast. Multiplanar CT image reconstructions and MIPs were
obtained to evaluate the vascular anatomy.
CONTRAST:  75mL [SN] IOPAMIDOL ([SN]) INJECTION 76%

[Series 5: cta thorax 2.00 bv36 s3 axial arterial · axial · arterial · 0.65mm/px · z∈[+1519,+1803]mm · 12 of 168 slices shown]
[im 13/168  lung]
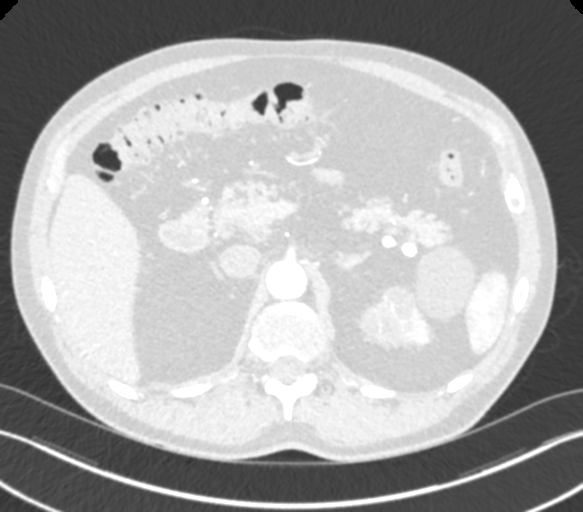
[im 26/168  mediastinal]
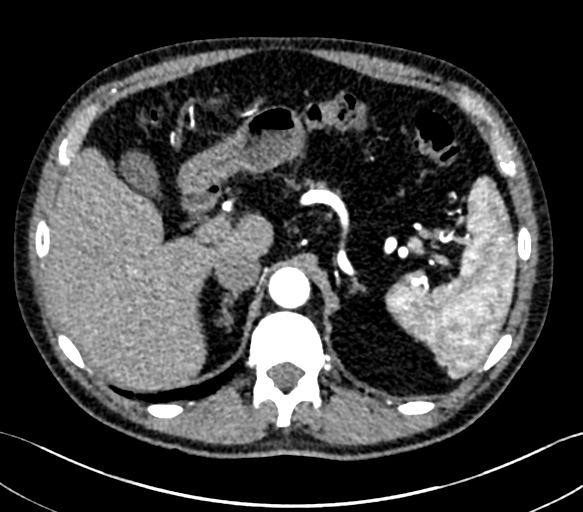
[im 39/168  lung]
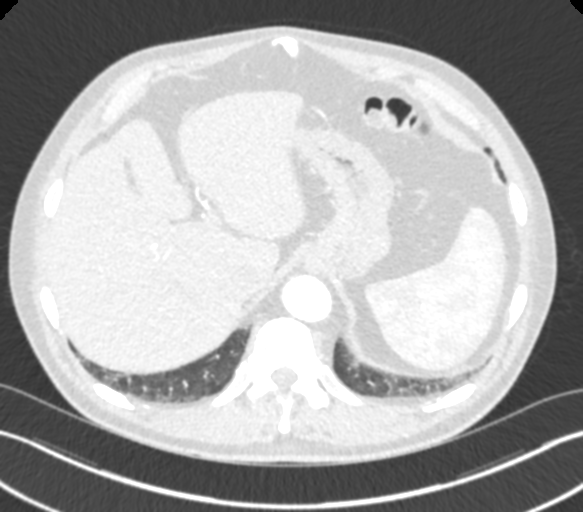
[im 52/168  mediastinal]
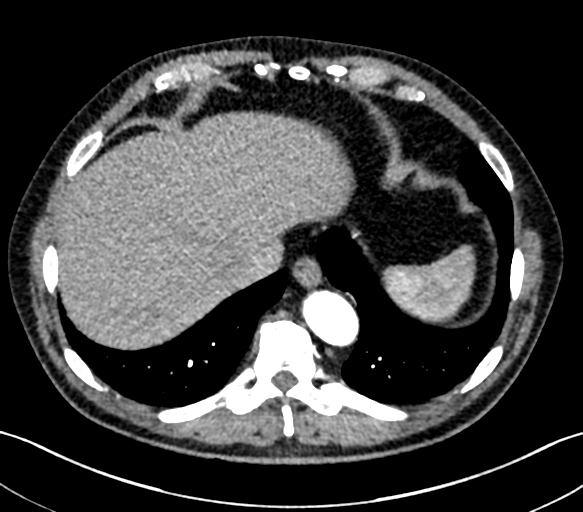
[im 65/168  lung]
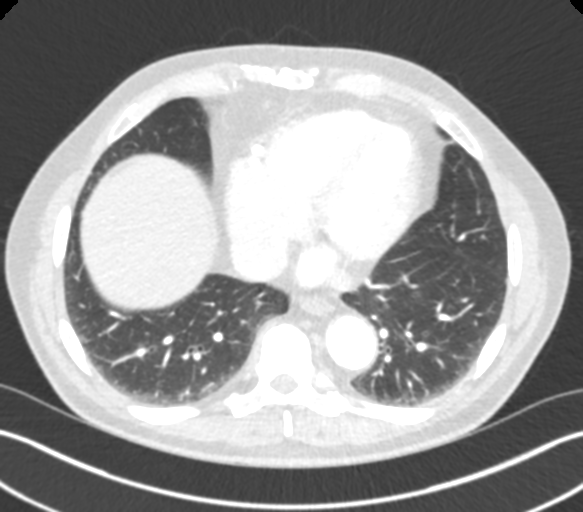
[im 78/168  mediastinal]
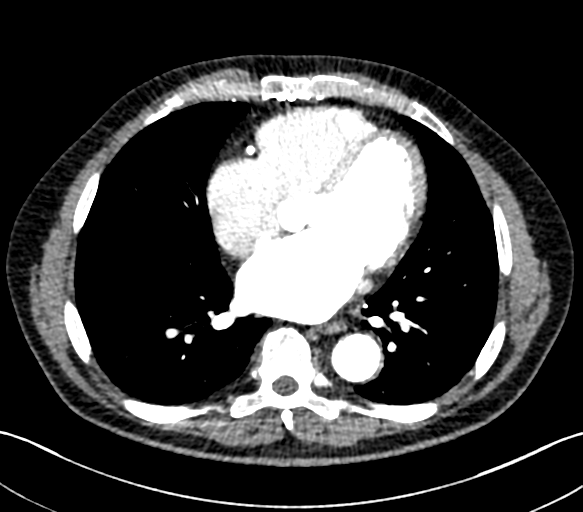
[im 90/168  lung]
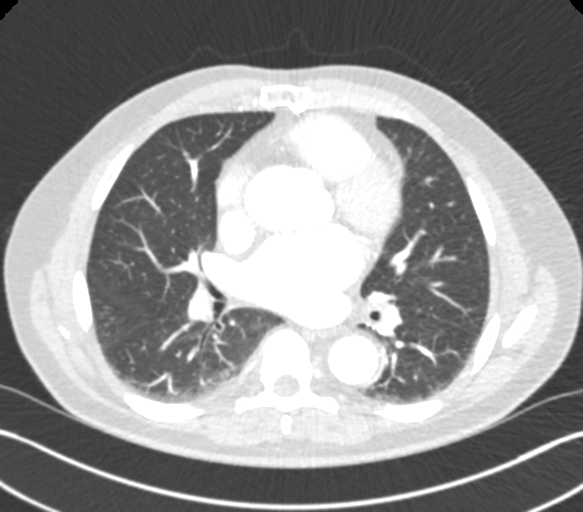
[im 103/168  mediastinal]
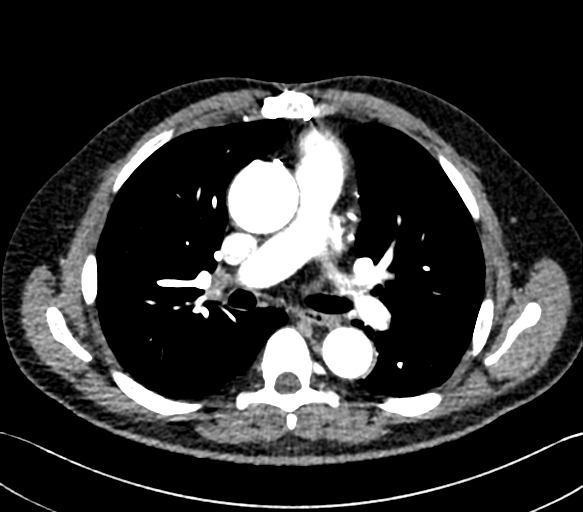
[im 116/168  lung]
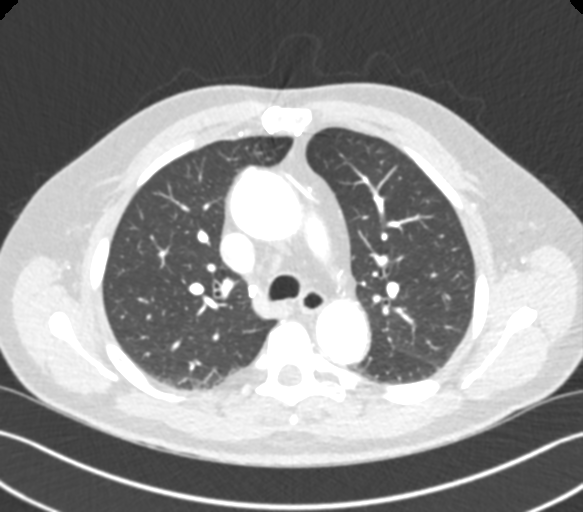
[im 129/168  mediastinal]
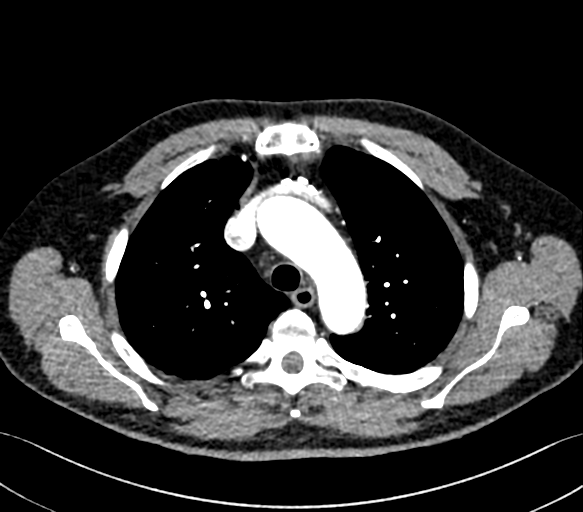
[im 142/168  lung]
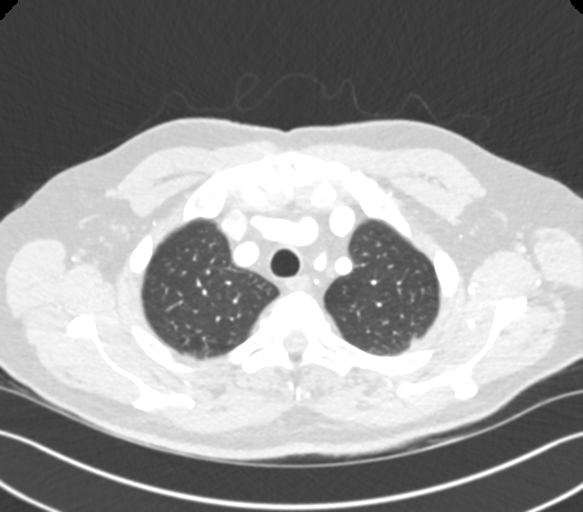
[im 155/168  mediastinal]
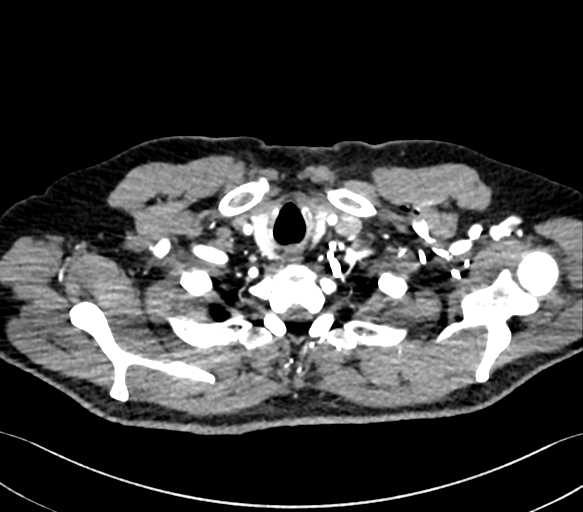

[Series 10: cta thorax 2.00 bv36 s3 cor st · coronal · 0.66mm/px · 1 of 163 slices shown]
[im 82/163  mediastinal]
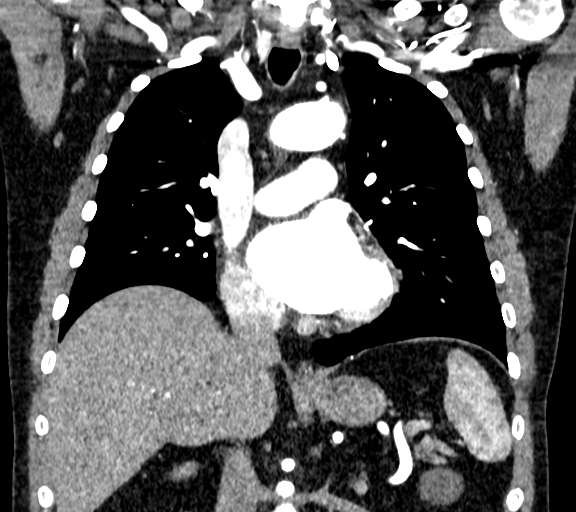

[13 of 36 positions shown; findings below may reference images not displayed]

FINDINGS: Cardiovascular:

Heart:

Unchanged heart size, borderline cardiomegaly. Surgical changes of
median sternotomy and CABG. Atherosclerotic changes of the native
coronary arteries. No pericardial fluid/thickening.

Aorta:

No significant calcifications of the aortic valve.

Greatest diameter of the ascending aorta estimated 44 mm. Aortic
arch without significant atherosclerotic changes. No dissection. No
periaortic fluid. Greatest diameter of the distal thoracic aorta at
the hiatus measures 2.5 cm.

Left vertebral artery originates between the left common carotid
artery and the left subclavian artery. No significant
atherosclerotic changes of the branch vessels.

Pulmonary arteries:

Timing of the contrast bolus not optimized for evaluation of
pulmonary arteries. Main pulmonary artery measures 2.5 cm.

Mediastinum/Nodes: Surgical changes of median sternotomy.
Unremarkable thoracic inlet.

Unremarkable course of the thoracic esophagus.

Lungs/Pleura: Central airways are clear. No pleural effusion. No
confluent airspace disease.

No pneumothorax.

Upper Abdomen: Small hiatal hernia. No acute finding of the upper
abdomen.

Musculoskeletal: No acute displaced fracture. Degenerative changes
of the spine.

Review of the MIP images confirms the above findings.
IMPRESSION: Unchanged appearance of the thoracic aorta, with the greatest
diameter of the ascending aorta estimated 44 mm.

Surgical changes of prior median sternotomy and CABG.

## 2019-07-01 MED ORDER — IOPAMIDOL (ISOVUE-370) INJECTION 76%
75.0000 mL | Freq: Once | INTRAVENOUS | Status: AC | PRN
  Administered 2019-07-01: 75 mL via INTRAVENOUS

## 2019-07-08 ENCOUNTER — Ambulatory Visit (INDEPENDENT_AMBULATORY_CARE_PROVIDER_SITE_OTHER): Payer: Medicare Other | Admitting: Surgery

## 2019-07-08 ENCOUNTER — Encounter: Payer: Self-pay | Admitting: Surgery

## 2019-07-08 ENCOUNTER — Other Ambulatory Visit: Payer: Self-pay

## 2019-07-08 VITALS — BP 126/81 | HR 69 | Temp 97.7°F | Resp 20 | Ht 69.5 in | Wt 186.0 lb

## 2019-07-08 DIAGNOSIS — I712 Thoracic aortic aneurysm, without rupture, unspecified: Secondary | ICD-10-CM

## 2019-07-08 DIAGNOSIS — I255 Ischemic cardiomyopathy: Secondary | ICD-10-CM | POA: Diagnosis not present

## 2019-07-08 NOTE — Progress Notes (Signed)
HPI:  The patient is a 73 year old gentleman who returns for follow-up of a 4.4 cm aortic root and 4.6 cm ascending aortic aneurysm that is stable dating back to 2015.  He said that he has been having some intermittent chest discomfort that is not necessarily related to exertion.  He was started on Imdur which is helped.  This has been followed by [name].  He had a nuclear stress test 11/2018 which showed a large fixed lateral wall perfusion defect consistent with scar with no reversible ischemia.  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  There was also loss of an end to side anastomosis of the sequential graft to the obtuse marginal.  There are patent bypass grafts to the diagonal and intermediate and a widely patent but diffusely diseased RCA. The LAD was severely and diffusely diseased. With no anterior wall ischemia is not felt that intervention on the LAD was necessary.   Current Outpatient Medications  Medication Sig Dispense Refill  . acetaminophen (TYLENOL) 500 MG tablet Take 1,000 mg by mouth every 8 (eight) hours as needed for moderate pain or headache.    [name] aspirin EC 81 MG tablet Take 81 mg by mouth daily.     [name] atorvastatin (LIPITOR) 80 MG tablet Take 40 mg by mouth daily.    . butalbital-acetaminophen-caffeine (FIORICET) 50-325-40 MG tablet Take 1-2 tablets by mouth every 6 (six) hours as needed for headache. 20 tablet 0  . doxepin (SINEQUAN) 25 MG capsule Take 25 mg by mouth at bedtime.     [name] escitalopram (LEXAPRO) 20 MG tablet Take 10 mg by mouth daily as needed (depression).     . Homeopathic Products (EARACHE DROPS OT) Place 1-2 drops 3 (three) times daily as needed in ear(s) (for ear pain.).    [name] isosorbide mononitrate (IMDUR) 30 MG 24 hr tablet Take 0.5 tablets (15 mg total) by mouth daily. 45 tablet 3  . losartan (COZAAR) 25 MG tablet Take 12.5 mg by mouth daily.    [name] LYSINE  PO Take 1 tablet by mouth daily as needed (fever blisters).    . metoprolol tartrate (LOPRESSOR) 50 MG tablet Take 1 tablet (50 mg total) by mouth 2 (two) times daily. 180 tablet 3  . spironolactone (ALDACTONE) 25 MG tablet Take 1 tablet (25 mg total) by mouth daily. 90 tablet 3  . XARELTO 20 MG TABS tablet TAKE 1 TABLET (20 MG TOTAL) BY MOUTH DAILY WITH SUPPER. 90 tablet 1  . zolpidem (AMBIEN) 5 MG tablet Take 2.5-5 mg by mouth at bedtime as needed for sleep.   1  . nitroGLYCERIN (NITROSTAT) 0.4 MG SL tablet Place 1 tablet (0.4 mg total) under the tongue every 5 (five) minutes as needed for chest pain. 25 tablet 3   No current facility-administered medications for this visit.    Physical Exam: BP 126/81   Pulse 69   Temp 97.7 F (36.5 C) (Temporal)   Resp 20   Ht 5' 9.5" (1.765 m)   Wt 186 lb (84.4 kg)   SpO2 97% Comment: RA  BMI 27.07 kg/m  He looks well. Cardiac exam shows a regular rate and rhythm with normal heart sounds.  There is no murmur. Lungs are clear.  Diagnostic Tests:  CLINICAL DATA:  74 year old male with a history of thoracic aortic aneurysm  EXAM: CT ANGIOGRAPHY CHEST WITH CONTRAST  TECHNIQUE: Multidetector CT  imaging of the chest was performed using the standard protocol during bolus administration of intravenous contrast. Multiplanar CT image reconstructions and MIPs were obtained to evaluate the vascular anatomy.  CONTRAST:  60mL ISOVUE-370 IOPAMIDOL (ISOVUE-370) INJECTION 76%  COMPARISON:  06/18/2018, 06/26/2017, most remote comparison 03/27/2013  FINDINGS: Cardiovascular:  Heart:  Unchanged heart size, borderline cardiomegaly. Surgical changes of median sternotomy and CABG. Atherosclerotic changes of the native coronary arteries. No pericardial fluid/thickening.  Aorta:  No significant calcifications of the aortic valve.  Greatest diameter of the ascending aorta estimated 44 mm. Aortic arch without significant atherosclerotic  changes. No dissection. No periaortic fluid. Greatest diameter of the distal thoracic aorta at the hiatus measures 2.5 cm.  Left vertebral artery originates between the left common carotid artery and the left subclavian artery. No significant atherosclerotic changes of the branch vessels.  Pulmonary arteries:  Timing of the contrast bolus not optimized for evaluation of pulmonary arteries. Main pulmonary artery measures 2.5 cm.  Mediastinum/Nodes: Surgical changes of median sternotomy. Unremarkable thoracic inlet.  Unremarkable course of the thoracic esophagus.  Lungs/Pleura: Central airways are clear. No pleural effusion. No confluent airspace disease.  No pneumothorax.  Upper Abdomen: Small hiatal hernia. No acute finding of the upper abdomen.  Musculoskeletal: No acute displaced fracture. Degenerative changes of the spine.  Review of the MIP images confirms the above findings.  IMPRESSION: Unchanged appearance of the thoracic aorta, with the greatest diameter of the ascending aorta estimated 44 mm.  Surgical changes of prior median sternotomy and CABG.  Signed,  Dulcy Fanny. Dellia Nims, RPVI  [facility]  [facility]   Electronically Signed   By: Corrie Mckusick D.O.   On: 07/01/2019 17:29   Impression:  He has a stable ascending aortic aneurysm with a diameter of 4.4 cm which has been fairly stable dating back to 2015.  This is well below the surgical threshold of 5.5 cm.  I reviewed the CT images with him and answered his questions.  I stressed the importance of continued good blood pressure control and preventing further enlargement and acute aortic dissection.  His chest pain symptoms have improved with starting Imdur and I do not think they are related to his aneurysm.  He will continue to follow-up with [name] concerning this.  I will plan to see him back in 2 years for a CTA of the  chest.  Plan:  He will return to see me in 2 years with a CTA of the chest to follow-up on his ascending aortic aneurysm.  I spent 15 minutes performing this established patient evaluation and > 50% of this time was spent face to face counseling and coordinating the care of this patient's aortic aneurysm.    Gaye Pollack, MD [facility] [phone]

## 2019-07-20 ENCOUNTER — Other Ambulatory Visit: Payer: Medicare Other

## 2019-09-08 ENCOUNTER — Ambulatory Visit: Payer: Medicare Other | Attending: Internal Medicine

## 2019-09-08 DIAGNOSIS — Z23 Encounter for immunization: Secondary | ICD-10-CM

## 2019-09-08 NOTE — Progress Notes (Signed)
   Covid-19 Vaccination Clinic  Name:  Sean Willis    MRN: [id number] DOB: August 29, 1945  09/08/2019  Sean Willis was observed post Covid-19 immunization for 30 minutes based on pre-vaccination screening without incident. He was provided with Vaccine Information Sheet and instruction to access the V-Safe system.   Sean Willis was instructed to call 911 with any severe reactions post vaccine: [name] Difficulty breathing  . Swelling of face and throat  . A fast heartbeat  . A bad rash all over body  . Dizziness and weakness

## 2019-09-14 DIAGNOSIS — R1084 Generalized abdominal pain: Secondary | ICD-10-CM | POA: Diagnosis not present

## 2019-09-14 DIAGNOSIS — R310 Gross hematuria: Secondary | ICD-10-CM | POA: Diagnosis not present

## 2019-09-14 DIAGNOSIS — Z87442 Personal history of urinary calculi: Secondary | ICD-10-CM | POA: Diagnosis not present

## 2019-09-22 DIAGNOSIS — Z87442 Personal history of urinary calculi: Secondary | ICD-10-CM | POA: Diagnosis not present

## 2019-09-22 DIAGNOSIS — R1084 Generalized abdominal pain: Secondary | ICD-10-CM | POA: Diagnosis not present

## 2019-09-22 DIAGNOSIS — R310 Gross hematuria: Secondary | ICD-10-CM | POA: Diagnosis not present

## 2019-09-27 ENCOUNTER — Other Ambulatory Visit: Payer: Self-pay | Admitting: Cardiology

## 2019-10-15 DIAGNOSIS — K8020 Calculus of gallbladder without cholecystitis without obstruction: Secondary | ICD-10-CM | POA: Diagnosis not present

## 2019-10-15 DIAGNOSIS — K5730 Diverticulosis of large intestine without perforation or abscess without bleeding: Secondary | ICD-10-CM | POA: Diagnosis not present

## 2019-10-15 DIAGNOSIS — R310 Gross hematuria: Secondary | ICD-10-CM | POA: Diagnosis not present

## 2019-10-15 DIAGNOSIS — N200 Calculus of kidney: Secondary | ICD-10-CM | POA: Diagnosis not present

## 2019-10-21 ENCOUNTER — Other Ambulatory Visit: Payer: Self-pay | Admitting: Cardiology

## 2019-10-22 NOTE — Telephone Encounter (Signed)
Pt last saw Melina Copa, PA on 06/22/19, last labs 06/29/19 Creat 1.28, age 74, weight 84.4kg, CrCl 60.44, based on CrCl pt is on appropriate dosage of Xarelto 20mg  QD.  Will refill rx.

## 2019-10-30 ENCOUNTER — Other Ambulatory Visit: Payer: Self-pay | Admitting: Cardiology

## 2019-10-30 DIAGNOSIS — N281 Cyst of kidney, acquired: Secondary | ICD-10-CM | POA: Diagnosis not present

## 2019-10-30 DIAGNOSIS — N3000 Acute cystitis without hematuria: Secondary | ICD-10-CM | POA: Diagnosis not present

## 2019-10-30 DIAGNOSIS — N200 Calculus of kidney: Secondary | ICD-10-CM | POA: Diagnosis not present

## 2019-11-11 DIAGNOSIS — N3000 Acute cystitis without hematuria: Secondary | ICD-10-CM | POA: Diagnosis not present

## 2019-11-12 ENCOUNTER — Telehealth: Payer: Self-pay | Admitting: Cardiology

## 2019-11-12 ENCOUNTER — Other Ambulatory Visit: Payer: Self-pay | Admitting: Urology

## 2019-11-12 NOTE — Telephone Encounter (Signed)
   [facility] Pre-operative Risk Assessment    HEARTCARE STAFF: - Please ensure there is not already an duplicate clearance open for this procedure. - Under Visit Info/Reason for Call, type in Other and utilize the format Clearance MM/DD/YY or Clearance TBD. Do not use dashes or single digits. - If request is for dental extraction, please clarify the # of teeth to be extracted.  Request for surgical clearance:  1. What type of surgery is being performed? Cystoscopy with left Uretheroscopy laser Lithopripsy   2. When is this surgery scheduled?  12-21-19  3. What type of clearance is required (medical clearance vs. Pharmacy clearance to hold med vs. Both)? Voth  4. Are there any medications that need to be held prior to surgery and how long? Xarelto- needs to be held 3 days prior   5. Practice name and name of physician performing surgery?  Dr Deniece Ree   6. What is the office phone number? [phone]   7.   What is the office fax number? [phone]  8.   Anesthesia type (None, local, MAC, general) ? General  Sean Willis 11/12/2019, 12:36 PM  _________________________________________________________________   (provider comments below)

## 2019-11-12 NOTE — Telephone Encounter (Signed)
   Primary Cardiologist: Fransico Him, MD  Chart reviewed as part of pre-operative protocol coverage. Patient was contacted 11/12/2019 in reference to pre-operative risk assessment for pending surgery as outlined below.  Sean Willis was last seen on 06/22/19 by [name].  Since that day, Sean Willis has done fine from a cardiac standpoint. He can complete 4 METs without anginal complaints.   Therefore, based on ACC/AHA guidelines, the patient would be at acceptable risk for the planned procedure without further cardiovascular testing.   The patient was advised that if he develops new symptoms prior to surgery to contact our office to arrange for a follow-up visit, and he verbalized understanding.  Per pharmacy recommendations, patient can hold xarelto 3 days prior to his upcoming procedure and should restart as soon as he is cleared to do so by his surgeon.  I will route this recommendation to the requesting party via Epic fax function and remove from pre-op pool. Please call with questions.  Abigail Butts, PA-C 11/12/2019, 4:43 PM

## 2019-11-12 NOTE — Telephone Encounter (Signed)
Patient with diagnosis of a fib on Xarelto for anticoagulation.    Procedure: Cystoscopy with left Uretheroscopy laser Lithopripsy  Date of procedure: 12/21/19  CHA2DS2-VASc Score = 3  This indicates a 3.2% annual risk of stroke. The patient's score is based upon: CHF History: 0 HTN History: 1 Diabetes History: 0 Stroke History: 0 Vascular Disease History: 1 Age Score: 1 Gender Score: 0    CrCl 70 mL/min Platelet count 173K  Per office protocol, patient can hold Xarelto for 3 days prior to procedure.

## 2019-11-17 DIAGNOSIS — E782 Mixed hyperlipidemia: Secondary | ICD-10-CM | POA: Diagnosis not present

## 2019-11-17 DIAGNOSIS — Z125 Encounter for screening for malignant neoplasm of prostate: Secondary | ICD-10-CM | POA: Diagnosis not present

## 2019-11-17 DIAGNOSIS — M109 Gout, unspecified: Secondary | ICD-10-CM | POA: Diagnosis not present

## 2019-11-17 DIAGNOSIS — I2510 Atherosclerotic heart disease of native coronary artery without angina pectoris: Secondary | ICD-10-CM | POA: Diagnosis not present

## 2019-11-17 DIAGNOSIS — G4700 Insomnia, unspecified: Secondary | ICD-10-CM | POA: Diagnosis not present

## 2019-11-17 DIAGNOSIS — N183 Chronic kidney disease, stage 3 unspecified: Secondary | ICD-10-CM | POA: Diagnosis not present

## 2019-11-17 DIAGNOSIS — I25810 Atherosclerosis of coronary artery bypass graft(s) without angina pectoris: Secondary | ICD-10-CM | POA: Diagnosis not present

## 2019-11-17 DIAGNOSIS — I4891 Unspecified atrial fibrillation: Secondary | ICD-10-CM | POA: Diagnosis not present

## 2019-11-17 DIAGNOSIS — I10 Essential (primary) hypertension: Secondary | ICD-10-CM | POA: Diagnosis not present

## 2019-11-17 DIAGNOSIS — I712 Thoracic aortic aneurysm, without rupture: Secondary | ICD-10-CM | POA: Diagnosis not present

## 2019-11-17 DIAGNOSIS — R7309 Other abnormal glucose: Secondary | ICD-10-CM | POA: Diagnosis not present

## 2019-11-17 DIAGNOSIS — F411 Generalized anxiety disorder: Secondary | ICD-10-CM | POA: Diagnosis not present

## 2019-12-17 ENCOUNTER — Other Ambulatory Visit (HOSPITAL_COMMUNITY): Payer: Medicare Other

## 2019-12-17 NOTE — Progress Notes (Signed)
Reviewed pt chart for pre-op interview.  Noted pt has an ef 35-40%.  This is below anesthesia  ambulatory surgery center guidelines.  Sent to anesthesia , Konrad Felix PA, stated ef to low needs to be moved to main OR.  Called and spoke w/ Coni, OR scheduler for Dr Abner Greenspan, via phone informed needs to moved to main OR.

## 2019-12-18 ENCOUNTER — Encounter (HOSPITAL_COMMUNITY)
Admission: RE | Admit: 2019-12-18 | Discharge: 2019-12-18 | Disposition: A | Discharge status: Home / Self Care | Payer: Medicare Other | Source: Ambulatory Visit | Attending: Urology | Admitting: Urology

## 2019-12-18 ENCOUNTER — Other Ambulatory Visit (HOSPITAL_COMMUNITY)
Admission: RE | Admit: 2019-12-18 | Discharge: 2019-12-18 | Disposition: A | Discharge status: Home / Self Care | Payer: Medicare Other | Source: Ambulatory Visit | Attending: Urology | Admitting: Urology

## 2019-12-18 ENCOUNTER — Other Ambulatory Visit: Payer: Self-pay

## 2019-12-18 ENCOUNTER — Encounter (HOSPITAL_COMMUNITY): Payer: Self-pay | Admitting: Urology

## 2019-12-18 DIAGNOSIS — N183 Chronic kidney disease, stage 3 unspecified: Secondary | ICD-10-CM | POA: Diagnosis not present

## 2019-12-18 DIAGNOSIS — Z7901 Long term (current) use of anticoagulants: Secondary | ICD-10-CM | POA: Insufficient documentation

## 2019-12-18 DIAGNOSIS — Z01812 Encounter for preprocedural laboratory examination: Secondary | ICD-10-CM | POA: Diagnosis not present

## 2019-12-18 DIAGNOSIS — I129 Hypertensive chronic kidney disease with stage 1 through stage 4 chronic kidney disease, or unspecified chronic kidney disease: Secondary | ICD-10-CM | POA: Insufficient documentation

## 2019-12-18 DIAGNOSIS — Z7982 Long term (current) use of aspirin: Secondary | ICD-10-CM | POA: Insufficient documentation

## 2019-12-18 DIAGNOSIS — Z20822 Contact with and (suspected) exposure to covid-19: Secondary | ICD-10-CM | POA: Diagnosis not present

## 2019-12-18 DIAGNOSIS — G4733 Obstructive sleep apnea (adult) (pediatric): Secondary | ICD-10-CM | POA: Diagnosis not present

## 2019-12-18 DIAGNOSIS — Z79899 Other long term (current) drug therapy: Secondary | ICD-10-CM | POA: Diagnosis not present

## 2019-12-18 DIAGNOSIS — N200 Calculus of kidney: Secondary | ICD-10-CM | POA: Diagnosis not present

## 2019-12-18 DIAGNOSIS — Z951 Presence of aortocoronary bypass graft: Secondary | ICD-10-CM | POA: Insufficient documentation

## 2019-12-18 LAB — BASIC METABOLIC PANEL
Anion gap: 9 (ref 5–15)
BUN: 19 mg/dL (ref 8–23)
CO2: 24 mmol/L (ref 22–32)
Calcium: 9.5 mg/dL (ref 8.9–10.3)
Chloride: 105 mmol/L (ref 98–111)
Creatinine, Ser: 1.32 mg/dL — ABNORMAL HIGH (ref 0.61–1.24)
GFR, Estimated: 57 mL/min — ABNORMAL LOW (ref >60)
Glucose, Bld: 125 mg/dL — ABNORMAL HIGH (ref 70–99)
Potassium: 4.9 mmol/L (ref 3.5–5.1)
Sodium: 138 mmol/L (ref 135–145)

## 2019-12-18 LAB — CBC
HCT: 49.7 % (ref 39.0–52.0)
Hemoglobin: 16.5 g/dL (ref 13.0–17.0)
MCH: 31.8 pg (ref 26.0–34.0)
MCHC: 33.2 g/dL (ref 30.0–36.0)
MCV: 95.8 fL (ref 80.0–100.0)
Platelets: 175 10*3/uL (ref 150–400)
RBC: 5.19 MIL/uL (ref 4.22–5.81)
RDW: 13.2 % (ref 11.5–15.5)
WBC: 8.4 10*3/uL (ref 4.0–10.5)
nRBC: 0 % (ref 0.0–0.2)

## 2019-12-18 LAB — SARS CORONAVIRUS 2 (TAT 6-24 HRS): SARS Coronavirus 2: NEGATIVE

## 2019-12-18 NOTE — Patient Instructions (Signed)
DUE TO COVID-19 ONLY ONE VISITOR IS ALLOWED TO COME WITH YOU AND STAY IN THE WAITING ROOM ONLY DURING PRE OP AND PROCEDURE.    COVID SWAB TESTING MUST BE COMPLETED ON:  Today, Dec. 17, 2021 at 12:30 PM   [address] [place], [place] [address]  (Must self quarantine after testing. Follow instructions on handout.)       Your procedure is scheduled on: Monday, Dec. 20, 2021   Report to [facility] Main  Entrance    Report to admitting at 12:00 PM   Call this number if you have problems the morning of surgery [phone]   Do not eat food :After Midnight.   May have liquids until 11:00 AM   day of surgery  CLEAR LIQUID DIET  Foods Allowed                                                                     Foods Excluded  Water, Black Coffee and tea, regular and decaf                             liquids that you cannot  Plain Jell-O in any flavor  (No red)                                           see through such as: Fruit ices (not with fruit pulp)                                     milk, soups, orange juice              Iced Popsicles (No red)                                    All solid food                                   Apple juices Sports drinks like Gatorade (No red) Lightly seasoned clear broth or consume(fat free) Sugar, honey syrup  Sample Menu Breakfast                                Lunch                                     Supper Cranberry juice                    Beef broth                            Chicken broth Jell-O  Grape juice                           Apple juice Coffee or tea                        Jell-O                                      Popsicle                                                Coffee or tea                        Coffee or tea      Oral Hygiene is also important to reduce your risk of infection.                                    Remember - BRUSH YOUR TEETH THE MORNING OF SURGERY WITH  YOUR REGULAR TOOTHPASTE   Do NOT smoke after Midnight   Take these medicines the morning of surgery with A SIP OF WATER: Atorvastatin, Metoprolol                                You may not have any metal on your body including jewelry, and body piercings             Do not wear lotions, powders, perfumes/cologne, or deodorant                          Men may shave face and neck.   Do not bring valuables to the hospital. [place].   Contacts, dentures or bridgework may not be worn into surgery.    Patients discharged the day of surgery will not be allowed to drive home.   Special Instructions: Bring a copy of your healthcare power of attorney and living will documents         the day of surgery if you haven't scanned them in before.              Please read over the following fact sheets you were given: IF YOU HAVE QUESTIONS ABOUT YOUR PRE OP INSTRUCTIONS PLEASE CALL [phone]   [place] - Preparing for Surgery Before surgery, you can play an important role.  Because skin is not sterile, your skin needs to be as free of germs as possible.  You can reduce the number of germs on your skin by washing with CHG (chlorahexidine gluconate) soap before surgery.  CHG is an antiseptic cleaner which kills germs and bonds with the skin to continue killing germs even after washing. Please DO NOT use if you have an allergy to CHG or antibacterial soaps.  If your skin becomes reddened/irritated stop using the CHG and inform your nurse when you arrive at Short Stay. Do not shave (including legs and underarms) for  at least 48 hours prior to the first CHG shower.  You may shave your face/neck.  Please follow these instructions carefully:  1.  Shower with CHG Soap the night before surgery and the  morning of surgery.  2.  If you choose to wash your hair, wash your hair first as usual with your normal  shampoo.  3.  After you shampoo, rinse your hair  and body thoroughly to remove the shampoo.                             4.  Use CHG as you would any other liquid soap.  You can apply chg directly to the skin and wash.  Gently with a scrungie or clean washcloth.  5.  Apply the CHG Soap to your body ONLY FROM THE NECK DOWN.   Do   not use on face/ open                           Wound or open sores. Avoid contact with eyes, ears mouth and   genitals (private parts).                       Wash face,  Genitals (private parts) with your normal soap.             6.  Wash thoroughly, paying special attention to the area where your    surgery  will be performed.  7.  Thoroughly rinse your body with warm water from the neck down.  8.  DO NOT shower/wash with your normal soap after using and rinsing off the CHG Soap.                9.  Pat yourself dry with a clean towel.            10.  Wear clean pajamas.            11.  Place clean sheets on your bed the night of your first shower and do not  sleep with pets. Day of Surgery : Do not apply any lotions/deodorants the morning of surgery.  Please wear clean clothes to the hospital/surgery center.  FAILURE TO FOLLOW THESE INSTRUCTIONS MAY RESULT IN THE CANCELLATION OF YOUR SURGERY  PATIENT SIGNATURE_________________________________  NURSE SIGNATURE__________________________________  ________________________________________________________________________

## 2019-12-18 NOTE — Progress Notes (Signed)
COVID Vaccine Completed: Yes  Date COVID Vaccine completed: x3 COVID vaccine manufacturer: Pfizer      PCP - [name], MD Cardiologist - Fransico Him, MD  last office visit D. Dunn PA 06/22/19 in epic, cardiac clearance 11/12/19 in epic  Chest x-ray - greater than 1 year in epic EKG -  Stress Test - 11/10/18 in epic ECHO - 11/19/18 in epic Cardiac Cath - 12/02/2018 in epic Pacemaker/ICD device last checked: [name]  Sleep Study - 06/04/2016 in epic CPAP - does not use   Fasting Blood Sugar - [name] Checks Blood Sugar _[name]____ times a day  Blood Thinner Instructions: Xarelto last dose 12/17/19 Aspirin Instructions: [name] Last Dose: [name]  Activity level:  Can go up a flight of stairs without stopping and without symptoms    Anesthesia review: CAD, OSA, Chronic A fib, History of CABG, History of MI, CKD   Patient denies shortness of breath, fever, cough and chest pain at PAT appointment   Patient verbalized understanding of instructions that were given to them at the PAT appointment. Patient was also instructed that they will need to review over the PAT instructions again at home before surgery.

## 2019-12-18 NOTE — Anesthesia Preprocedure Evaluation (Addendum)
Anesthesia Evaluation  Patient identified by MRN, date of birth, ID band Patient awake    Reviewed: Allergy & Precautions, NPO status , Patient's Chart, lab work & pertinent test results  Airway Mallampati: II  TM Distance: >3 FB Neck ROM: Full    Dental  (+) Dental Advisory Given   Pulmonary sleep apnea ,    Pulmonary exam normal breath sounds clear to auscultation       Cardiovascular hypertension, Pt. on home beta blockers and Pt. on medications + CAD, + Past MI, + CABG and +CHF  Normal cardiovascular exam+ dysrhythmias Atrial Fibrillation + Valvular Problems/Murmurs MR  Rhythm:Regular Rate:Normal  Echo 11/19/2018 1. Left ventricular ejection fraction, by visual estimation, is 35 to 40%. The left ventricle has moderately decreased function. Left ventricular septal wall thickness was normal. There is no left ventricular hypertrophy.  2. Left ventricular diastolic parameters are indeterminate.  3. Moderately dilated left ventricular internal cavity size.  4. Diffuse hypokinesis.  5. Global right ventricle has normal systolic function.The right ventricular size is normal. No increase in right ventricular wall thickness.  6. Left atrial size was severely dilated.  7. Right atrial size was normal.  8. The mitral valve is normal in structure. Moderate mitral valve regurgitation.  9. The tricuspid valve is normal in structure. Tricuspid valve regurgitation is mild.  10. The aortic valve is tricuspid. Aortic valve regurgitation is mild. Mild to moderate aortic valve sclerosis/calcification without any evidence of aortic stenosis.  11. The pulmonic valve was grossly normal. Pulmonic valve regurgitation is mild.  12. Aortic dilatation noted.  13. There is moderate dilatation of the aortic root measuring 45 mm.  14. Mildly elevated pulmonary artery systolic pressure.     Neuro/Psych TIA   GI/Hepatic Neg liver ROS, GERD  ,   Endo/Other  negative endocrine ROS  Renal/GU Renal disease     Musculoskeletal  (+) Arthritis ,   Abdominal   Peds  Hematology negative hematology ROS (+)   Anesthesia Other Findings   Reproductive/Obstetrics                           Anesthesia Physical Anesthesia Plan  ASA: III  Anesthesia Plan: General   Post-op Pain Management:    Induction: Intravenous  PONV Risk Score and Plan: 3 and Ondansetron, Dexamethasone and Treatment may vary due to age or medical condition  Airway Management Planned: LMA  Additional Equipment: None  Intra-op Plan:   Post-operative Plan: Extubation in OR  Informed Consent: I have reviewed the patients History and Physical, chart, labs and discussed the procedure including the risks, benefits and alternatives for the proposed anesthesia with the patient or authorized representative who has indicated his/her understanding and acceptance.     Dental advisory given  Plan Discussed with: CRNA  Anesthesia Plan Comments: (See PAT note 12/18/2019, Konrad Felix, PA-C)      Anesthesia Quick Evaluation

## 2019-12-18 NOTE — Patient Instructions (Addendum)
DUE TO COVID-19 ONLY ONE VISITOR IS ALLOWED TO COME WITH YOU AND STAY IN THE WAITING ROOM ONLY DURING PRE OP AND PROCEDURE.     COVID SWAB TESTING MUST BE COMPLETED ON:  Today @ 12:30   [address] [place], [place] [address]  (Must self quarantine after testing. Follow instructions on handout.)        Your procedure is scheduled on:  Monday, 12-21-19   Report to [facility] Main  Entrance   Report to admitting at 12:00 PM   Call this number if you have problems the morning of surgery [phone]   Do not eat food :After Midnight.   May have liquids until 11:00 AM  day of surgery  CLEAR LIQUID DIET  Foods Allowed                                                                     Foods Excluded  Water, Black Coffee and tea, regular and decaf               liquids that you cannot  Plain Jell-O in any flavor  (No red)                                     see through such as: Fruit ices (not with fruit pulp)                                      milk, soups, orange juice              Iced Popsicles (No red)                                      All solid food                                   Apple juices Sports drinks like Gatorade (No red) Lightly seasoned clear broth or consume(fat free) Sugar, honey syrup   Oral Hygiene is also important to reduce your risk of infection.                                    Remember - BRUSH YOUR TEETH THE MORNING OF SURGERY WITH YOUR REGULAR TOOTHPASTE   Do NOT smoke after Midnight   Take these medicines the morning of surgery with A SIP OF WATER:  Atorvastatin, Metoprolol                               You may not have any metal on your body including jewelry, and body piercings             Do not wear lotions, powders, perfumes/cologne, or deodorant              Men may shave face  and neck.   Do not bring valuables to the hospital. [place].   Contacts, dentures or bridgework may not be  worn into surgery.   Patients discharged the day of surgery will not be allowed to drive home.             Please read over the following fact sheets you were given: IF YOU HAVE QUESTIONS ABOUT YOUR PRE OP INSTRUCTIONS PLEASE CALL [phone]   [place] - Preparing for Surgery Before surgery, you can play an important role.  Because skin is not sterile, your skin needs to be as free of germs as possible.  You can reduce the number of germs on your skin by washing with CHG (chlorahexidine gluconate) soap before surgery.  CHG is an antiseptic cleaner which kills germs and bonds with the skin to continue killing germs even after washing. Please DO NOT use if you have an allergy to CHG or antibacterial soaps.  If your skin becomes reddened/irritated stop using the CHG and inform your nurse when you arrive at Short Stay. Do not shave (including legs and underarms) for at least 48 hours prior to the first CHG shower.  You may shave your face/neck.  Please follow these instructions carefully:  1.  Shower with CHG Soap the night before surgery and the  morning of surgery.  2.  If you choose to wash your hair, wash your hair first as usual with your normal  shampoo.  3.  After you shampoo, rinse your hair and body thoroughly to remove the shampoo.                             4.  Use CHG as you would any other liquid soap.  You can apply chg directly to the skin and wash.  Gently with a scrungie or clean washcloth.  5.  Apply the CHG Soap to your body ONLY FROM THE NECK DOWN.   Do   not use on face/ open                           Wound or open sores. Avoid contact with eyes, ears mouth and   genitals (private parts).                       Wash face,  Genitals (private parts) with your normal soap.             6.  Wash thoroughly, paying special attention to the area where your    surgery  will be performed.  7.  Thoroughly rinse your body with warm water from the neck down.  8.  DO NOT shower/wash  with your normal soap after using and rinsing off the CHG Soap.                9.  Pat yourself dry with a clean towel.            10.  Wear clean pajamas.            11.  Place clean sheets on your bed the night of your first shower and do not  sleep with pets. Day of Surgery : Do not apply any lotions/deodorants the morning of surgery.  Please wear clean clothes to the hospital/surgery center.  FAILURE TO FOLLOW THESE INSTRUCTIONS MAY RESULT IN THE  CANCELLATION OF YOUR SURGERY  PATIENT SIGNATURE_________________________________  NURSE SIGNATURE__________________________________  ________________________________________________________________________

## 2019-12-18 NOTE — Progress Notes (Signed)
Anesthesia Chart Review   Case: [id number] Date/Time: 12/21/19 1345   Procedure: CYSTOSCOPY/RETROGREADE/URETEROSCOPY/HOLMIUM LASER/STENT PLACEMENT (Left ) - ONLY NEEDS 60  MIN   Anesthesia type: General   Pre-op diagnosis: BILATERAL RENAL STONES   Location: [facility] ROOM 01 / [facility]   Surgeons: Janith Lima, MD      DISCUSSION:74 y.o. never smoker with h/o GERD, CAD (CABG 2013), IDCM (EF 30-40% on Echo 11/19/2018) OSA on CPAP, A-fib (Xarelto), CKD Stage III, bilateral renal stones scheduled for above procedure 12/21/2019 with Dr. Rexene Alberts.   Per cardiology preoperative risk assessment 11/12/2019, "Chart reviewed as part of pre-operative protocol coverage. Patient was contacted 11/12/2019 in reference to pre-operative risk assessment for pending surgery as outlined below.  ACELIN FERDIG was last seen on 06/22/19 by [name].  Since that day, JOVANNI RASH has done fine from a cardiac standpoint. He can complete 4 METs without anginal complaints.  Therefore, based on ACC/AHA guidelines, the patient would be at acceptable risk for the planned procedure without further cardiovascular testing.  The patient was advised that if he develops new symptoms prior to surgery to contact our office to arrange for a follow-up visit, and he verbalized understanding. Per pharmacy recommendations, patient can hold xarelto 3 days prior to his upcoming procedure and should restart as soon as he is cleared to do so by his surgeon."  Previously scheduled at [facility], moved due to EF of 30-40%.   Anticipate pt can proceed with planned procedure barring acute status change.   VS: There were no vitals taken for this visit.  PROVIDERS: [name], MD is PCP   Fransico Him, MD is Cardiologist  LABS: labs DOS, SDW (all labs ordered are listed, but only abnormal results are displayed)  Labs Reviewed  BASIC METABOLIC PANEL  CBC     IMAGES:   EKG: 12/18/2019 Rate 70 bpm  Atrial fibrillation    CV: Echo 11/19/2018 IMPRESSIONS    1. Left ventricular ejection fraction, by visual estimation, is 35 to  40%. The left ventricle has moderately decreased function. Left  ventricular septal wall thickness was normal. There is no left ventricular  hypertrophy.  2. Left ventricular diastolic parameters are indeterminate.  3. Moderately dilated left ventricular internal cavity size.  4. Diffuse hypokinesis.  5. Global right ventricle has normal systolic function.The right  ventricular size is normal. No increase in right ventricular wall  thickness.  6. Left atrial size was severely dilated.  7. Right atrial size was normal.  8. The mitral valve is normal in structure. Moderate mitral valve  regurgitation.  9. The tricuspid valve is normal in structure. Tricuspid valve  regurgitation is mild.  10. The aortic valve is tricuspid. Aortic valve regurgitation is mild.  Mild to moderate aortic valve sclerosis/calcification without any evidence  of aortic stenosis.  11. The pulmonic valve was grossly normal. Pulmonic valve regurgitation is  mild.  12. Aortic dilatation noted.  13. There is moderate dilatation of the aortic root measuring 45 mm.  14. Mildly elevated pulmonary artery systolic pressure.     Past Medical History:  Diagnosis Date  . Benign essential HTN 11/24/2013  . BPH (benign prostatic hyperplasia)    patient unaware  . CAD (coronary artery disease), native coronary artery 04/2011   S/P CABG X4 Sx done in [place]  . Carotid artery stenosis    1-39% bilateral by dopplers 2018  . Chest wall pain    Right side, muscular,ortho,MRI. U/S abd ok. On  lyrica  . Chronic atrial fibrillation ([place])    s/p DCCV 01/2016, subsequent reversion, patient denied AAD  . Chronic insomnia   . CKD (chronic kidney disease), stage III ([place])   . Colon polyp 10/11  . DDD (degenerative disc disease), lumbar   . Dilatation of aorta ([facility])    4.5 cm ascending aortic aneurysm  per chets ct 05-18-15 epic, diameter of the ascending aorta estimated 44 mm 07/01/19  . ED (erectile dysfunction)   . GERD (gastroesophageal reflux disease)   . Gout   . History of kidney stones   . History of migraine   . Kidney stone    uric acid, Dr [name]  renal insufficiency  . Mitral regurgitation 06/04/2016   Moderate by echo 2020  . Mixed hyperlipidemia 01/23/2013  . Myocardial infarction ([place])   . OSA on CPAP 07/16/2016  . Osteoarthritis    of the knee  . TIA (transient ischemic attack)    questionable Mini stroke per patient    Past Surgical History:  Procedure Laterality Date  . CARDIOVERSION [name] 01/20/2016   Procedure: CARDIOVERSION;  Surgeon: Thayer Headings, MD;  Location: [facility];  Service: Cardiovascular;  Laterality: [name];  . CARDIOVERSION [name] 03/23/2016   Procedure: CARDIOVERSION;  Surgeon: Sueanne Margarita, MD;  Location[facility];  Service: Cardiovascular;  Laterality: [name];  . CARDIOVERSION    . CATARACT EXTRACTION W/PHACO Right 10/26/2014   Procedure: CATARACT EXTRACTION PHACO AND INTRAOCULAR LENS PLACEMENT (IOC);  Surgeon: [name], MD;  Location: [facility];  Service: Ophthalmology;  Laterality: Right;  LOT PACK: [id number] H US:01:43.0 AP:27.8 CDE:28.68  . CATARACT EXTRACTION W/PHACO Left 11/13/2016   Procedure: CATARACT EXTRACTION PHACO AND INTRAOCULAR LENS PLACEMENT (IOC);  Surgeon: [name], MD;  Location: [facility] ORS;  Service: Ophthalmology;  Laterality: Left;  [place] 01:00.2 AP% 15.7 CDE 9.47 Fluid Pack lot # [id number]  . COLONOSCOPY WITH PROPOFOL [name] 09/27/2015   Procedure: COLONOSCOPY WITH PROPOFOL;  Surgeon: [name], MD;  [facility];  Service: Endoscopy;  Laterality: [name];  . CORONARY ARTERY BYPASS GRAFT  04/2011   Severe multivessel ASCAD s/p Stemi w PTCA of OM2 then s/p CABG w LIMA to LAD,SVG to OM1 /RI and SVG to Distal RCA   . FINGER SURGERY     left finger for ganglion cyst  . KIDNEY STONE SURGERY    . LEFT HEART  CATH AND CORS/GRAFTS ANGIOGRAPHY [name] 12/02/2018   Procedure: LEFT HEART CATH AND CORS/GRAFTS ANGIOGRAPHY;  Surgeon: Belva Crome, MD;  Location: [facility];  Service: Cardiovascular;  Laterality: [name];  . TONSILLECTOMY      MEDICATIONS: . acetaminophen (TYLENOL) 500 MG tablet  . allopurinol (ZYLOPRIM) 100 MG tablet  . aspirin EC 81 MG tablet  . atorvastatin (LIPITOR) 80 MG tablet  . doxepin (SINEQUAN) 25 MG capsule  . escitalopram (LEXAPRO) 20 MG tablet  . Homeopathic Products (EARACHE DROPS OT)  . isosorbide mononitrate (IMDUR) 30 MG 24 hr tablet  . losartan (COZAAR) 25 MG tablet  . LYSINE PO  . metoprolol tartrate (LOPRESSOR) 50 MG tablet  . nitroGLYCERIN (NITROSTAT) 0.4 MG SL tablet  . spironolactone (ALDACTONE) 25 MG tablet  . XARELTO 20 MG TABS tablet  . zolpidem (AMBIEN) 5 MG tablet   No current facility-administered medications for this encounter.    Konrad Felix, PA-C [facility]-Surgical Testing [phone]

## 2019-12-21 ENCOUNTER — Ambulatory Visit (HOSPITAL_COMMUNITY)
Admission: RE | Admit: 2019-12-21 | Discharge: 2019-12-21 | Disposition: A | Discharge status: Home / Self Care | Payer: Medicare Other | Attending: Urology | Admitting: Urology

## 2019-12-21 ENCOUNTER — Ambulatory Visit (HOSPITAL_COMMUNITY): Payer: Medicare Other

## 2019-12-21 ENCOUNTER — Encounter (HOSPITAL_COMMUNITY)
Admission: RE | Disposition: A | Discharge status: Home / Self Care | Payer: Self-pay | Source: Home / Self Care | Attending: Urology

## 2019-12-21 ENCOUNTER — Encounter (HOSPITAL_COMMUNITY): Payer: Self-pay | Admitting: Urology

## 2019-12-21 ENCOUNTER — Ambulatory Visit (HOSPITAL_COMMUNITY): Payer: Medicare Other | Admitting: Physician Assistant

## 2019-12-21 ENCOUNTER — Telehealth: Payer: Self-pay | Admitting: Cardiology

## 2019-12-21 DIAGNOSIS — Z8673 Personal history of transient ischemic attack (TIA), and cerebral infarction without residual deficits: Secondary | ICD-10-CM | POA: Insufficient documentation

## 2019-12-21 DIAGNOSIS — Z823 Family history of stroke: Secondary | ICD-10-CM | POA: Insufficient documentation

## 2019-12-21 DIAGNOSIS — N281 Cyst of kidney, acquired: Secondary | ICD-10-CM | POA: Insufficient documentation

## 2019-12-21 DIAGNOSIS — Z886 Allergy status to analgesic agent status: Secondary | ICD-10-CM | POA: Diagnosis not present

## 2019-12-21 DIAGNOSIS — I2510 Atherosclerotic heart disease of native coronary artery without angina pectoris: Secondary | ICD-10-CM | POA: Diagnosis not present

## 2019-12-21 DIAGNOSIS — N401 Enlarged prostate with lower urinary tract symptoms: Secondary | ICD-10-CM | POA: Insufficient documentation

## 2019-12-21 DIAGNOSIS — I110 Hypertensive heart disease with heart failure: Secondary | ICD-10-CM | POA: Diagnosis not present

## 2019-12-21 DIAGNOSIS — I4819 Other persistent atrial fibrillation: Secondary | ICD-10-CM | POA: Diagnosis not present

## 2019-12-21 DIAGNOSIS — I509 Heart failure, unspecified: Secondary | ICD-10-CM | POA: Diagnosis not present

## 2019-12-21 DIAGNOSIS — G4733 Obstructive sleep apnea (adult) (pediatric): Secondary | ICD-10-CM | POA: Diagnosis not present

## 2019-12-21 DIAGNOSIS — N3001 Acute cystitis with hematuria: Secondary | ICD-10-CM | POA: Diagnosis not present

## 2019-12-21 DIAGNOSIS — Z951 Presence of aortocoronary bypass graft: Secondary | ICD-10-CM | POA: Diagnosis not present

## 2019-12-21 DIAGNOSIS — N200 Calculus of kidney: Secondary | ICD-10-CM | POA: Insufficient documentation

## 2019-12-21 HISTORY — PX: CYSTOSCOPY/URETEROSCOPY/HOLMIUM LASER/STENT PLACEMENT: SHX6546

## 2019-12-21 HISTORY — DX: Chronic kidney disease, stage 3 unspecified: N18.30

## 2019-12-21 HISTORY — DX: Transient cerebral ischemic attack, unspecified: G45.9

## 2019-12-21 HISTORY — DX: Personal history of other diseases of the nervous system and sense organs: Z86.69

## 2019-12-21 SURGERY — CYSTOSCOPY/URETEROSCOPY/HOLMIUM LASER/STENT PLACEMENT
Anesthesia: General | Site: Ureter | Laterality: Left

## 2019-12-21 MED ORDER — PROPOFOL 10 MG/ML IV BOLUS
INTRAVENOUS | Status: DC | PRN
  Administered 2019-12-21: 150 mg via INTRAVENOUS

## 2019-12-21 MED ORDER — EPHEDRINE SULFATE-NACL 50-0.9 MG/10ML-% IV SOSY
PREFILLED_SYRINGE | INTRAVENOUS | Status: DC | PRN
  Administered 2019-12-21 (×7): 10 mg via INTRAVENOUS

## 2019-12-21 MED ORDER — SODIUM CHLORIDE 0.9 % IR SOLN
Status: DC | PRN
  Administered 2019-12-21: 6000 mL

## 2019-12-21 MED ORDER — GLYCOPYRROLATE 0.2 MG/ML IJ SOLN
INTRAMUSCULAR | Status: DC | PRN
  Administered 2019-12-21: .2 mg via INTRAVENOUS

## 2019-12-21 MED ORDER — FENTANYL CITRATE (PF) 100 MCG/2ML IJ SOLN: 25.0000 ug | INTRAMUSCULAR | Status: DC | PRN

## 2019-12-21 MED ORDER — ONDANSETRON HCL 4 MG/2ML IJ SOLN: 4.0000 mg | Freq: Once | INTRAMUSCULAR | Status: DC | PRN

## 2019-12-21 MED ORDER — GLYCOPYRROLATE PF 0.2 MG/ML IJ SOSY
PREFILLED_SYRINGE | INTRAMUSCULAR | Status: AC
  Filled 2019-12-21: qty 1

## 2019-12-21 MED ORDER — EPHEDRINE 5 MG/ML INJ
INTRAVENOUS | Status: AC
  Filled 2019-12-21: qty 20

## 2019-12-21 MED ORDER — LIDOCAINE HCL (PF) 2 % IJ SOLN
INTRAMUSCULAR | Status: AC
  Filled 2019-12-21: qty 5

## 2019-12-21 MED ORDER — LACTATED RINGERS IV SOLN: INTRAVENOUS | Status: DC

## 2019-12-21 MED ORDER — PHENYLEPHRINE HCL-NACL 10-0.9 MG/250ML-% IV SOLN
INTRAVENOUS | Status: DC | PRN
  Administered 2019-12-21: 20 ug/min via INTRAVENOUS

## 2019-12-21 MED ORDER — IOHEXOL 300 MG/ML  SOLN
INTRAMUSCULAR | Status: DC | PRN
  Administered 2019-12-21: 9 mL

## 2019-12-21 MED ORDER — DEXAMETHASONE SODIUM PHOSPHATE 10 MG/ML IJ SOLN
INTRAMUSCULAR | Status: AC
  Filled 2019-12-21: qty 1

## 2019-12-21 MED ORDER — CEPHALEXIN 500 MG PO CAPS: 500.0000 mg | ORAL_CAPSULE | Freq: Two times a day (BID) | ORAL | 0 refills | Status: AC

## 2019-12-21 MED ORDER — ONDANSETRON HCL 4 MG/2ML IJ SOLN
INTRAMUSCULAR | Status: DC | PRN
  Administered 2019-12-21: 4 mg via INTRAVENOUS

## 2019-12-21 MED ORDER — PROPOFOL 10 MG/ML IV BOLUS
INTRAVENOUS | Status: AC
  Filled 2019-12-21: qty 20

## 2019-12-21 MED ORDER — DEXAMETHASONE SODIUM PHOSPHATE 10 MG/ML IJ SOLN
INTRAMUSCULAR | Status: DC | PRN
  Administered 2019-12-21: 4 mg via INTRAVENOUS

## 2019-12-21 MED ORDER — HYDROCODONE-ACETAMINOPHEN 5-325 MG PO TABS: 1.0000 | ORAL_TABLET | Freq: Four times a day (QID) | ORAL | 0 refills | Status: DC | PRN

## 2019-12-21 MED ORDER — ONDANSETRON HCL 4 MG/2ML IJ SOLN
INTRAMUSCULAR | Status: AC
  Filled 2019-12-21: qty 2

## 2019-12-21 MED ORDER — FENTANYL CITRATE (PF) 100 MCG/2ML IJ SOLN
INTRAMUSCULAR | Status: AC
  Filled 2019-12-21: qty 2

## 2019-12-21 MED ORDER — CHLORHEXIDINE GLUCONATE 0.12 % MT SOLN
15.0000 mL | Freq: Once | OROMUCOSAL | Status: AC
  Administered 2019-12-21: 15 mL via OROMUCOSAL

## 2019-12-21 MED ORDER — DOCUSATE SODIUM 100 MG PO CAPS: 100.0000 mg | ORAL_CAPSULE | Freq: Every day | ORAL | 0 refills | Status: DC | PRN

## 2019-12-21 MED ORDER — CEFAZOLIN SODIUM-DEXTROSE 2-4 GM/100ML-% IV SOLN
2.0000 g | Freq: Once | INTRAVENOUS | Status: AC
  Administered 2019-12-21: 2 g via INTRAVENOUS
  Filled 2019-12-21: qty 100

## 2019-12-21 MED ORDER — FENTANYL CITRATE (PF) 100 MCG/2ML IJ SOLN
INTRAMUSCULAR | Status: DC | PRN
  Administered 2019-12-21: 50 ug via INTRAVENOUS

## 2019-12-21 MED ORDER — LIDOCAINE 2% (20 MG/ML) 5 ML SYRINGE
INTRAMUSCULAR | Status: DC | PRN
  Administered 2019-12-21: 40 mg via INTRAVENOUS

## 2019-12-21 MED ORDER — PHENYLEPHRINE HCL (PRESSORS) 10 MG/ML IV SOLN
INTRAVENOUS | Status: AC
  Filled 2019-12-21: qty 1

## 2019-12-21 MED ORDER — ORAL CARE MOUTH RINSE: 15.0000 mL | Freq: Once | OROMUCOSAL | Status: AC

## 2019-12-21 SURGICAL SUPPLY — 23 items
BAG URO CATCHER STRL LF (MISCELLANEOUS) ×2 IMPLANT
BASKET ZERO TIP NITINOL 2.4FR (BASKET) IMPLANT
CATH URET 5FR 28IN OPEN ENDED (CATHETERS) ×2 IMPLANT
CLOTH BEACON ORANGE TIMEOUT ST (SAFETY) ×2 IMPLANT
EXTRACTOR STONE NITINOL NGAGE (UROLOGICAL SUPPLIES) ×2 IMPLANT
FIBER LASER MOSES 200 DFL (Laser) IMPLANT
FIBER LASER TRACTIP 200 (UROLOGICAL SUPPLIES) ×2 IMPLANT
GLOVE BIOGEL M 7.0 STRL (GLOVE) ×2 IMPLANT
GOWN STRL REUS W/TWL LRG LVL3 (GOWN DISPOSABLE) ×2 IMPLANT
GUIDEWIRE STR DUAL SENSOR (WIRE) ×4 IMPLANT
GUIDEWIRE ZIPWRE .038 STRAIGHT (WIRE) IMPLANT
IV NS 1000ML (IV SOLUTION) ×2
IV NS 1000ML BAXH (IV SOLUTION) ×1 IMPLANT
KIT TURNOVER KIT A (KITS) IMPLANT
LASER FIB FLEXIVA PULSE ID 365 (Laser) IMPLANT
MANIFOLD NEPTUNE II (INSTRUMENTS) ×2 IMPLANT
PACK CYSTO (CUSTOM PROCEDURE TRAY) ×2 IMPLANT
SHEATH URETERAL 12FRX35CM (MISCELLANEOUS) ×2 IMPLANT
STENT CONTOUR 6FRX26X.038 (STENTS) ×2 IMPLANT
TRACTIP FLEXIVA PULS ID 200XHI (Laser) IMPLANT
TRACTIP FLEXIVA PULSE ID 200 (Laser)
TUBING CONNECTING 10 (TUBING) ×2 IMPLANT
TUBING UROLOGY SET (TUBING) ×2 IMPLANT

## 2019-12-21 NOTE — Op Note (Signed)
Operative Note  Preoperative diagnosis:  1.  Left renal stones  Postoperative diagnosis: 1.  Left renal stones  Procedure(s): 1.  Cystoscopy 2. Left retrograde pyelogram 3. Left ureteroscopy with laser lithotripsy and basket extraction of stone 4. Left ureteral stent placement 5. Fluoroscopy <1 hour with intraoperative interpretation  Surgeon: Rexene Alberts, MD  Assistants:  None  Anesthesia:  General  Complications:  None  EBL:  minimal  Specimens: 1. Stones for stone analysis  Drains/Catheters: 1.  6Fr x26cm left ureteral stent  Intraoperative findings:   1. Multiple stones ranging from 3-27mm throughout upper pole, interpole and lower pole calyces all succesfully fragmented, basket extracted. No significant bleeding. No ureteral trauma.  Indication:  Sean Willis is a 74 y.o. male with bilateral nephrolithiasis.CT A/P 10/30/2019 revealed bilateral nonobstructing nephrolithiasis. He had a 2 mm right mid kidney nonobstructive calculus. And there about 7 nonobstructive left renal calculi, the largest measuring 0.6 cm.  He presents today for definitive treatment of his left renal stones.  Description of procedure: After informed consent was obtained from the patient, the patient was identified and taken to the operating room and placed in the supine position.  General anesthesia was administered as well as perioperative IV antibiotics.  At the beginning of the case, a time-out was performed to properly identify the patient, the surgery to be performed, and the surgical site.  Sequential compression devices were applied to the lower extremities at the beginning of the case for DVT prophylaxis.  The patient was then placed in the dorsal lithotomy supine position, prepped and draped in sterile fashion.  Preliminary scout fluoroscopy revealed that there were several calcifications within the upper, interpolar and lower pole of the left renal system.  We then passed the 21-French  rigid cystoscope through the urethra and into the bladder under vision without any difficulty , noting a normal urethra without strictures and a mildly obstructing prostate.  A systematic evaluation of the bladder revealed no evidence of any suspicious bladder lesions.  Ureteral orifices were in normal position.    Under cystoscopic and flouroscopic guidance, we cannulated the left ureteral orifice with a 5-French open-ended ureteral catheter and a gentle retrograde pyelogram was performed, revealing a normal caliber ureter without any filling defects. There was no hydronephrosis of the collecting system. A 0.038 sensor wire was then passed up to the level of the renal pelvis and secured to the drape as a safety wire. The ureteral catheter and cystoscope were removed, leaving the safety wire in place.   A semi-rigid ureteroscope was passed alongside the wire up the distal ureter which appeared normal. A second 0.038 sensor wire was passed under direct vision and the semirigid scope was removed.  The inner sheath of a 12/14 French access sheath was then passed without difficulty.  Then a 12/14Fr ureteral access sheath was carefully advanced up the ureter to the level of the UPJ over this wire under fluoroscopic guidance. The flexible ureteroscope was advanced into the collecting system via the access sheath. The collecting system was inspected. The calculus was identified at the left upper, interpolar and lower pole calyces. Using the 240 micron holmium laser fiber, the stones were fragmented completely. An engage basket was used to remove the fragments under visual guidance. These were sent for chemical analysis. With the ureteroscope in the kidney, a gentle pyelogram was performed to delineate the calyceal system and we evaluated the calyces systematically. We encountered a stone within the left lower pole.  This was  again fragmented and basket extracted. The rest of the stone fragments were very tiny and these  were  irrigated away gently. The calyces were re-inspected and there were no significant stone fragment residual.   We then withdrew the ureteroscope back down the ureter along with the access sheath, noting no evidence of any stones along the course of the ureter.  Prior to removing the ureteroscope, we did pass the Glidewire back up to the ureter to the renal pelvis. Once the ureteroscope was removed, the Glidewire was backloaded through the rigid cystoscope, which was then advanced down the urethra and into the bladder. We then used the Glidewire under direct vision through the rigid cystoscope and under fluoroscopic guidance and passed up a 6-French, 26 cm double-pigtail ureteral stent up ureter, making sure that the proximal and distal ends coiled within the kidney and bladder respectively.  Note that we left a long tether string attached to the distal end of the ureteral stent and it exited the urethral meatus and was secured to the penile shaft with a tegaderm adhesive.  The cystoscope was then advanced back into the bladder under vision.  We were able to see the distal stent coiling nicely within the bladder.  The bladder was then emptied with irrigation solution.  The cystoscope was then removed.    The patient tolerated the procedure well and there was no complication. Patient was awoken from anesthesia and taken to the recovery room in stable condition. I was present and scrubbed for the entirety of the case.  Plan:  Patient will be discharged home.  He will remove his stent on Thursday morning.  Matt [facility]  Pager: [phone]

## 2019-12-21 NOTE — Telephone Encounter (Signed)
New Message   Pt c/o medication issue:  1. Name of Medication: XARELTO 20 MG TABS tablet  2. How are you currently taking this medication (dosage and times per day)? Pt stopped taking 12/16 for Procedure   3. Are you having a reaction (difficulty breathing--STAT)? No   4. What is your medication issue? Pt is calling, he is now heading home from the hospital from a procedure. He is wondering if tonight or tomorrow he is suppose to start back taking Xarelto   Please call

## 2019-12-21 NOTE — Transfer of Care (Signed)
Immediate Anesthesia Transfer of Care Note  Patient: Sean Willis  Procedure(s) Performed: CYSTOSCOPY/RETROGREADE PYLEOGRAM/URETEROSCOPY/HOLMIUM LASER/STENT PLACEMENT (Left Ureter)  Patient Location: PACU  Anesthesia Type:General  Level of Consciousness: awake, alert  and patient cooperative  Airway & Oxygen Therapy: Patient Spontanous Breathing and Patient connected to face mask oxygen  Post-op Assessment: Report given to RN and Post -op Vital signs reviewed and stable  Post vital signs: Reviewed and stable  Last Vitals:  Vitals Value Taken Time  BP 135/102 12/21/19 1432  Temp 36.4 C 12/21/19 1432  Pulse 83 12/21/19 1434  Resp 9 12/21/19 1434  SpO2 100 % 12/21/19 1434  Vitals shown include unvalidated device data.  Last Pain:  Vitals:   12/21/19 1432  TempSrc:   PainSc: 0-No pain         Complications: No complications documented.

## 2019-12-21 NOTE — Telephone Encounter (Signed)
Spoke with the patient and advised him that he should start back on xarelto as soon as his surgeon clears him. Patient verbalized understanding.

## 2019-12-21 NOTE — H&P (Signed)
Office Visit Report     10/30/2019   --------------------------------------------------------------------------------   Sean Willis  MRN: [id number]  DOB: 09-Sep-1945, 74 year old Male  SSN: [id number]   PRIMARY CARE:  [name], MD  REFERRING:  Ammie Dalton, NP  PROVIDER:  Rexene Alberts, M.D.  LOCATION:  [facility] [phone]     --------------------------------------------------------------------------------   CC/HPI: Sean Willis is a 74 year old male seen in follow-up for gross hematuria evaluation.   He was supposed to undergo cystoscopy today however he is symptomatic for urinary tract infection. Urinalysis is indicative of a UTI.   CT A/P 10/30/2019 revealed bilateral nonobstructing nephrolithiasis. He had a 2 mm right mid kidney nonobstructive calculus. And there about 7 nonobstructive left renal calculi, the largest measuring 0.6 cm.   He previously presented with left-sided flank pain on 09/2019. He has had a history of urolithiasis and passed these previously. He is also had ureteroscopy in the past.   Today he complains of dysuria, gross hematuria, urinary frequency and urgency. He denies fevers or chills.     ALLERGIES: No Allergies    MEDICATIONS: Tamsulosin Hcl 0.4 mg capsule 1 capsule PO Daily  Atorvastatin Calcium  Co Q10  Doxepin Hcl 10 mg capsule  Hydrocodone-Acetaminophen 5 mg-325 mg tablet 1 tablet PO Q 6 H PRN  Metoprolol Tartrate 25 mg tablet Oral  Nasonex 50 mcg/actuation aerosol, spray with pump 0 Nasal  Xeralto  Zolpidem Tartrate 5 mg tablet     GU PSH: Cystoscopy - 2019 Locm 300-399Mg /Ml Iodine,1Ml - 10/15/2019, 2019       PSH Notes: Heart Surgery   NON-GU PSH: Cataract surgery, Left     GU PMH: Gross hematuria - 10/15/2019, - 09/22/2019, - 09/14/2019, - 2019, - 2019 Flank Pain - 09/22/2019, - 09/14/2019 History of urolithiasis - 09/22/2019, - 09/14/2019 Microscopic hematuria - 2018 BPH w/LUTS, Benign prostatic  hyperplasia with urinary obstruction - 2016 Nocturia, Nocturia - 2016 Renal calculus, Uric acid nephrolithiasis - 2016, Bilateral kidney stones, - 2015 Renal cyst, Renal cyst, acquired - 2016, Renal cysts, acquired, bilateral, - 2015 ED due to arterial insufficiency, Erectile dysfunction due to arterial insufficiency - 2014 Hydronephrosis Unspec, Hydronephrosis, right - 2014 RLQ pain, Abdominal pain, RLQ (right lower quadrant) - 2014 Ureteral calculus, Calculus of distal right ureter - 2014, Calculus of left ureter, - 2014, Calculus of ureter, - 2014      PMH Notes:  2011-07-27 11:28:51 - Note: Acute Myocardial Infarction  2005-12-20 14:51:12 - Note: Peptic Ulcer  2011-01-30 10:14:54 - Note: Skin Rash In The Groin Entire Area   NON-GU PMH: Encounter for general adult medical examination without abnormal findings, Encounter for preventive health examination - 2015 Pain in unspecified hip, Joint pain, hip - 2014 Personal history of other diseases of the circulatory system, History of hypertension - 2014 Personal history of other diseases of the digestive system, History of esophageal reflux - 2014    FAMILY HISTORY: Family Health Status Number - Runs In Family Urologic Disorder - Runs In Family   SOCIAL HISTORY: Marital Status: Married Preferred Language: English; Ethnicity: Not Hispanic Or Latino; Race: White Loss adjuster, chartered.  Does not drink caffeine.     Notes: Never A Smoker, Occupation:, Marital History - Currently Married, Caffeine Use   REVIEW OF SYSTEMS:    GU Review Male:   Patient denies frequent urination, hard to postpone urination, burning/ pain with urination, get up at night to urinate, leakage of urine, stream starts and stops, trouble starting  your stream, have to strain to urinate , erection problems, and penile pain.  Gastrointestinal (Upper):   Patient denies nausea, vomiting, and indigestion/ heartburn.  Gastrointestinal (Lower):   Patient denies diarrhea and  constipation.  Constitutional:   Patient denies fever, night sweats, weight loss, and fatigue.  Skin:   Patient denies skin rash/ lesion and itching.  Eyes:   Patient denies double vision and blurred vision.  Ears/ Nose/ Throat:   Patient denies sore throat and sinus problems.  Hematologic/Lymphatic:   Patient denies swollen glands and easy bruising.  Cardiovascular:   Patient denies leg swelling and chest pains.  Respiratory:   Patient denies cough and shortness of breath.  Endocrine:   Patient denies excessive thirst.  Musculoskeletal:   Patient denies back pain and joint pain.  Neurological:   Patient denies headaches and dizziness.  Psychologic:   Patient denies depression and anxiety.   VITAL SIGNS:      10/30/2019 10:49 AM  Weight 185 lb / 83.91 kg  Height 69 in / 175.26 cm  BP 124/78 mmHg  Pulse 82 /min  Temperature 97.7 F / 36.5 C  BMI 27.3 kg/m   MULTI-SYSTEM PHYSICAL EXAMINATION:    Constitutional: Well-nourished. No physical deformities. Normally developed. Good grooming.  Respiratory: No labored breathing, no use of accessory muscles.   Cardiovascular: Normal temperature, normal extremity pulses, no swelling, no varicosities.  Gastrointestinal: No mass, no tenderness, no rigidity, non obese abdomen.     Complexity of Data:  Source Of History:  Patient, Medical Record Summary  Records Review:   AUA Symptom Score, Previous Doctor Records, Previous Hospital Records, Previous Patient Records  Urine Test Review:   Urinalysis  X-Ray Review: C.T. Abdomen/Pelvis: Reviewed Films. Reviewed Report.     07/28/11 03/30/09  PSA  Total PSA 0.47  0.53    Notes:                     CLINICAL DATA: Gross hematuria 2 weeks ago lasting for 2 days.  History of renal calculi.   EXAM:  CT ABDOMEN AND PELVIS WITHOUT AND WITH CONTRAST   TECHNIQUE:  Multidetector CT imaging of the abdomen and pelvis was performed  following the standard protocol before and following the bolus   administration of intravenous contrast.   CONTRAST: 125 cc Omnipaque 300   COMPARISON: 07/10/2017   FINDINGS:  Lower chest: Left anterior descending, circumflex, and right  coronary atherosclerosis. Descending thoracic aortic  atherosclerosis.   Hepatobiliary: Small dependent gallstones in the gallbladder.  Otherwise unremarkable.   Pancreas: Unremarkable   Spleen: Unremarkable   Adrenals/Urinary Tract: Both adrenal glands appear normal. Stable  bilateral hypodense renal lesions compatible with cysts, some of  these lesions are technically too small to characterize. Stable  precontrast hyperdense 0.6 cm lesion of the right mid kidney  posteriorly on image 42/2. A 1.4 by 0.8 cm hyperdense lesion of the  right kidney lower pole on image 50/2 is likewise stable. Several  tiny hyperdense lesions of both kidneys peripherally are likewise  similar to prior and probably complex cysts, but too small to  characterize.   2 mm right mid kidney nonobstructive renal calculus. There are about  7 nonobstructive left renal calculi, 1 of the larger calculi in the  left mid kidney measures 0.6 cm in long axis.   No hydronephrosis or hydroureter. No additional filling defects or  abnormal enhancement along the urothelium identified.   Stomach/Bowel: Sigmoid colon diverticulosis.   Vascular/Lymphatic: Aortoiliac  atherosclerotic vascular disease.   Reproductive: Unremarkable   Other: No supplemental non-categorized findings.   Musculoskeletal: Prior median sternotomy. Grade 1 degenerative  anterolisthesis at L4-5. Lumbar spondylosis and degenerative disc  disease causing impingement at L4-5 and potentially at L2-3.   IMPRESSION:  1. Bilateral nonobstructive nephrolithiasis.  2. Stable bilateral hypodense renal lesions compatible with cysts,  some of these lesions are technically too small to characterize.  3. There are also stable hyperdense precontrast lesions along both  kidneys,  too small to characterize, but most likely to be complex  cysts.  4. Other imaging findings of potential clinical significance:  Coronary atherosclerosis. Cholelithiasis. Sigmoid colon  diverticulosis. Lumbar spondylosis and degenerative disc disease  causing impingement at L4-5 and potentially at L2-3.  5. Aortic atherosclerosis.   Aortic Atherosclerosis (ICD10-I70.0).    Electronically Signed  By: Van Clines M.D.  On: 10/15/2019 10:02   PROCEDURES:          Urinalysis w/Scope Dipstick Dipstick Cont'd Micro  Color: Yellow Bilirubin: Neg mg/dL WBC/hpf: 10 - 20/hpf  Appearance: Slightly Cloudy Ketones: Neg mg/dL RBC/hpf: 0 - 2/hpf  Specific Gravity: 1.025 Blood: Neg ery/uL Bacteria: Few (10-25/hpf)  pH: 5.5 Protein: Neg mg/dL Cystals: NS (Not Seen)  Glucose: Neg mg/dL Urobilinogen: 0.2 mg/dL Casts: NS (Not Seen)    Nitrites: Neg Trichomonas: Not Present    Leukocyte Esterase: 1+ leu/uL Mucous: Present      Epithelial Cells: 0 - 5/hpf      Yeast: NS (Not Seen)      Sperm: Not Present    ASSESSMENT:      ICD-10 Details  1 GU:   Flank Pain - R10.84   2   Gross hematuria - R31.0   3   Renal calculus - N20.0   4   Acute Cystitis/UTI - N30.00   5   Renal cyst - N28.1    PLAN:            Medications New Meds: Bactrim Ds 800 mg-160 mg tablet 1 tablet PO BID   #14  0 Refill(s)            Orders Labs CULTURE, URINE          Schedule Return Visit/Planned Activity: 2 Weeks - Cystoscopy, Office Visit          Document Letter(s):  Created for Patient: Clinical Summary         Notes:   #1. Bilateral renal calculi: CT A/P 10/30/2019 reveals a 2 mm right renal calculus as well as 7 separate left renal stones the largest measuring 6 mm. As he is asymptomatic from the right, he elects to hold off on treating this time. However as he is symptomatic on the left, he wishes to pursue treatment. We discussed options for treatment including surveillance, ureteroscopy, ESWL or  PCNL. He elects left ureteroscopy with laser lithotripsy. Risks and benefits discussed today. Will arrange.   #2Johney Maine hematuria: CT A/P 10/30/2019 with bilateral nonobstructing stones as above. Also noted were bilateral hypodense renal lesions compatible cysts, some too small to characterize. Return to clinic in 2-3 weeks for cystoscopy to complete gross hematuria evaluation.   #3: Bilateral renal cysts: CT A/P 10/29 bilateral hypodense renal lesions compatible cysts, some too small to characterize. We will plan to surveil again in a year.   4. Acute cystitis: Symptomatic for urinary tract infection today. Urinalysis indicative of UTI. Will send urine for culture. Will treat prophylactically with Bactrim.   CC: Benita Stabile,  MD          Next Appointment:      Next Appointment: 11/23/2019 10:00 AM    Appointment Type: Male Cysto    Location: [facility] [phone]    Provider: Rexene Alberts, M.D.    Reason for Visit: 1 mo ov/cysto-BELL PT/ resch from 10/30/19 due to UTI       Signed by Rexene Alberts, M.D. on 10/30/19 at 11:04 AM (EDT)  Urology Preoperative H&P   Chief Complaint: Bilateral renal stones  History of Present Illness: Sean Willis is a 74 y.o. male with bilateral renal stones here for cysto, L URS/LL, basket extraction of his left sided renal stones. UCx 11/11/2019 was negative. Denies fevers, chills, dysuria.    Past Medical History:  Diagnosis Date  . Benign essential HTN 11/24/2013  . BPH (benign prostatic hyperplasia)    patient unaware  . CAD (coronary artery disease), native coronary artery 04/2011   S/P CABG X4 Sx done [place]  . Carotid artery stenosis    1-39% bilateral by dopplers 2018  . Chest wall pain    Right side, muscular,ortho,MRI. U/S abd ok. On lyrica  . Chronic atrial fibrillation ([place])    s/p DCCV 01/2016, subsequent reversion, patient denied AAD  . Chronic insomnia   . CKD (chronic kidney disease), stage  III ([place])   . Colon polyp 10/11  . DDD (degenerative disc disease), lumbar   . Dilatation of aorta ([facility])    4.5 cm ascending aortic aneurysm per chets ct 05-18-15 epic, diameter of the ascending aorta estimated 44 mm 07/01/19  . ED (erectile dysfunction)   . GERD (gastroesophageal reflux disease)   . Gout   . History of kidney stones   . History of migraine   . Kidney stone    uric acid, Dr [name]  renal insufficiency  . Mitral regurgitation 06/04/2016   Moderate by echo 2020  . Mixed hyperlipidemia 01/23/2013  . Myocardial infarction ([place])   . OSA on CPAP 07/16/2016  . Osteoarthritis    of the knee  . TIA (transient ischemic attack)    questionable Mini stroke per patient    Past Surgical History:  Procedure Laterality Date  . CARDIOVERSION [name] 01/20/2016   Procedure: CARDIOVERSION;  Surgeon: Thayer Headings, MD;  Location: [facility];  Service: Cardiovascular;  Laterality: [name];  . CARDIOVERSION [name] 03/23/2016   Procedure: CARDIOVERSION;  Surgeon: Sueanne Margarita, MD;  Location: [facility];  Service: Cardiovascular;  Laterality: [name];  . CARDIOVERSION    . CATARACT EXTRACTION W/PHACO Right 10/26/2014   Procedure: CATARACT EXTRACTION PHACO AND INTRAOCULAR LENS PLACEMENT (IOC);  Surgeon: [name], MD;  Location: [facility];  Service: Ophthalmology;  Laterality: Right;  LOT PACK: [id number] H US:01:43.0 AP:27.8 CDE:28.68  . CATARACT EXTRACTION W/PHACO Left 11/13/2016   Procedure: CATARACT EXTRACTION PHACO AND INTRAOCULAR LENS PLACEMENT (IOC);  Surgeon: [name], MD;  Location: [facility];  Service: Ophthalmology;  Laterality: Left;  [place] 01:00.2 AP% 15.7 CDE 9.47 Fluid Pack lot # [id number]  . COLONOSCOPY WITH PROPOFOL [name] 09/27/2015   Procedure: COLONOSCOPY WITH PROPOFOL;  Surgeon: [name], MD;  Location: [facility];  Service: Endoscopy;  Laterality: [name];  . CORONARY ARTERY BYPASS GRAFT  04/2011   Severe multivessel ASCAD s/p Stemi w PTCA of OM2 then s/p CABG w  LIMA to LAD,SVG to OM1 /RI and SVG to Distal RCA   . FINGER SURGERY     left finger for ganglion cyst  .  KIDNEY STONE SURGERY    . LEFT HEART CATH AND CORS/GRAFTS ANGIOGRAPHY [name] 12/02/2018   Procedure: LEFT HEART CATH AND CORS/GRAFTS ANGIOGRAPHY;  Surgeon: Belva Crome, MD;  Location: [facility];  Service: Cardiovascular;  Laterality: [name];  . TONSILLECTOMY      Allergies:  Allergies  Allergen Reactions  . Celebrex [Celecoxib]     Upset stomach  . Entresto [Sacubitril-Valsartan]     Low BP/difficulty talking/migraine    Family History  Problem Relation Age of Onset  . Alzheimer's disease Mother   . Cancer Mother   . CVA Father   . Arrhythmia Father   . Arrhythmia Sister     Social History:  reports that he has never smoked. He has never used smokeless tobacco. He reports current alcohol use. He reports that he does not use drugs.  ROS: A complete review of systems was performed.  All systems are negative except for pertinent findings as noted.  Physical Exam:  Vital signs in last 24 hours: Temp:  [97.7 F (36.5 C)] 97.7 F (36.5 C) (12/20 1129) Pulse Rate:  [61] 61 (12/20 1129) Resp:  [18] 18 (12/20 1129) BP: (139)/(96) 139/96 (12/20 1129) SpO2:  [95 %] 95 % (12/20 1129) Weight:  [88.1 kg] 88.1 kg (12/20 1130) Constitutional:  Alert and oriented, No acute distress Cardiovascular: Regular rate and rhythm Respiratory: Normal respiratory effort, Lungs clear bilaterally GI: Abdomen is soft, nontender, nondistended, no abdominal masses GU: No CVA tenderness Lymphatic: No lymphadenopathy Neurologic: Grossly intact, no focal deficits Psychiatric: Normal mood and affect  Laboratory Data:  No results for input(s): WBC, HGB, HCT, PLT in the last 72 hours.  No results for input(s): NA, K, CL, GLUCOSE, BUN, CALCIUM, CREATININE in the last 72 hours.  Invalid input(s): CO3   No results found for this or any previous visit (from the past 24 hour(s)). Recent  Results (from the past 240 hour(s))  SARS CORONAVIRUS 2 (TAT 6-24 HRS) Nasopharyngeal Nasopharyngeal Swab     Status: None   Collection Time: 12/18/19  2:03 PM   Specimen: Nasopharyngeal Swab  Result Value Ref Range Status   SARS Coronavirus 2 NEGATIVE NEGATIVE Final    Comment: (NOTE) SARS-CoV-2 target nucleic acids are NOT DETECTED.  The SARS-CoV-2 RNA is generally detectable in upper and lower respiratory specimens during the acute phase of infection. Negative results do not preclude SARS-CoV-2 infection, do not rule out co-infections with other pathogens, and should not be used as the sole basis for treatment or other patient management decisions. Negative results must be combined with clinical observations, patient history, and epidemiological information. The expected result is Negative.  Fact Sheet for Patients: SugarRoll.be  Fact Sheet for Healthcare Providers: https://www.woods-mathews.com/  This test is not yet approved or cleared by the [place] FDA and  has been authorized for detection and/or diagnosis of SARS-CoV-2 by FDA under an Emergency Use Authorization (EUA). This EUA will remain  in effect (meaning this test can be used) for the duration of the COVID-19 declaration under Se ction 564(b)(1) of the Act, 21 U.S.C. section 360bbb-3(b)(1), unless the authorization is terminated or revoked sooner.  Performed at [facility], [address], [place], [place] [address]     Renal Function: Recent Labs    12/18/19 1116  CREATININE 1.32*   Estimated Creatinine Clearance: 54 mL/min (A) (by C-G formula based on SCr of 1.32 mg/dL (H)).  Radiologic Imaging: No results found.  I independently reviewed the above imaging studies.  Assessment and Plan Sean Willis is a 74 y.o. male with bilateral renal stones here for cysto, L URS/LL, basket extraction of his left sided renal stones. UCx 11/11/2019 was  negative.   Matt R. Travaris Kosh MD 12/21/2019, 11:50 AM  [facility] Pager: [phone]): [phone]

## 2019-12-21 NOTE — Discharge Instructions (Signed)
   Activity:  You are encouraged to ambulate frequently (about every hour during waking hours) to help prevent blood clots from forming in your legs or lungs.     Diet: You should advance your diet as instructed by your physician.  It will be normal to have some bloating, nausea, and abdominal discomfort intermittently.   Prescriptions:  You will be provided a prescription for pain medication to take as needed.  If your pain is not severe enough to require the prescription pain medication, you may take extra strength Tylenol instead which will have less side effects.  You should also take a prescribed stool softener to avoid straining with bowel movements as the prescription pain medication may constipate you.  What to call us about: You should call the office [phone]) if you develop fever > 101 or develop persistent vomiting. Activity:  You are encouraged to ambulate frequently (about every hour during waking hours) to help prevent blood clots from forming in your legs or lungs.   You have a left ureteral stent draining your left kidney.  Remove this on Thursday morning by pulling on the attached string taped to your penis.

## 2019-12-21 NOTE — Anesthesia Procedure Notes (Signed)
Procedure Name: LMA Insertion Date/Time: 12/21/2019 12:38 PM Performed by: Eben Burow, CRNA Pre-anesthesia Checklist: Patient identified, Emergency Drugs available, Suction available, Patient being monitored and Timeout performed Patient Re-evaluated:Patient Re-evaluated prior to induction Oxygen Delivery Method: Circle system utilized Preoxygenation: Pre-oxygenation with 100% oxygen Induction Type: IV induction Ventilation: Mask ventilation without difficulty LMA: LMA inserted LMA Size: 4.0 Placement Confirmation: positive ETCO2 Tube secured with: Tape Dental Injury: Teeth and Oropharynx as per pre-operative assessment

## 2019-12-21 NOTE — Anesthesia Postprocedure Evaluation (Signed)
Anesthesia Post Note  Patient: Sean Willis  Procedure(s) Performed: CYSTOSCOPY/RETROGREADE PYLEOGRAM/URETEROSCOPY/HOLMIUM LASER/STENT PLACEMENT (Left Ureter)     Patient location during evaluation: PACU Anesthesia Type: General Level of consciousness: sedated and patient cooperative Pain management: pain level controlled Vital Signs Assessment: post-procedure vital signs reviewed and stable Respiratory status: spontaneous breathing Cardiovascular status: stable Anesthetic complications: no   No complications documented.  Last Vitals:  Vitals:   12/21/19 1447 12/21/19 1502  BP: (!) 134/101 (!) 140/108  Pulse: 94 90  Resp: (!) 9 12  Temp:  (!) 36.4 C  SpO2: 97% 96%    Last Pain:  Vitals:   12/21/19 1502  TempSrc:   PainSc: 0-No pain                 Nolon Nations

## 2019-12-21 NOTE — OR Nursing (Signed)
Called anestheisa about patient's vital signs and he okayed patient to go home and take home medications.

## 2019-12-22 ENCOUNTER — Encounter (HOSPITAL_COMMUNITY): Payer: Self-pay | Admitting: Urology

## 2019-12-23 DIAGNOSIS — N200 Calculus of kidney: Secondary | ICD-10-CM | POA: Diagnosis not present

## 2019-12-23 DIAGNOSIS — R1084 Generalized abdominal pain: Secondary | ICD-10-CM | POA: Diagnosis not present

## 2019-12-23 DIAGNOSIS — N3000 Acute cystitis without hematuria: Secondary | ICD-10-CM | POA: Diagnosis not present

## 2019-12-24 DIAGNOSIS — N200 Calculus of kidney: Secondary | ICD-10-CM | POA: Diagnosis not present

## 2020-01-14 ENCOUNTER — Other Ambulatory Visit: Payer: Self-pay | Admitting: Cardiology

## 2020-01-21 ENCOUNTER — Ambulatory Visit: Payer: Medicare Other | Admitting: Cardiology

## 2020-02-01 DIAGNOSIS — N281 Cyst of kidney, acquired: Secondary | ICD-10-CM | POA: Diagnosis not present

## 2020-02-01 DIAGNOSIS — Z87442 Personal history of urinary calculi: Secondary | ICD-10-CM | POA: Diagnosis not present

## 2020-02-17 ENCOUNTER — Encounter: Payer: Self-pay | Admitting: Cardiology

## 2020-02-17 ENCOUNTER — Other Ambulatory Visit: Payer: Self-pay

## 2020-02-17 ENCOUNTER — Ambulatory Visit (INDEPENDENT_AMBULATORY_CARE_PROVIDER_SITE_OTHER): Payer: Medicare Other | Admitting: Cardiology

## 2020-02-17 VITALS — BP 116/80 | HR 75 | Ht 69.0 in | Wt 194.0 lb

## 2020-02-17 DIAGNOSIS — I2510 Atherosclerotic heart disease of native coronary artery without angina pectoris: Secondary | ICD-10-CM | POA: Diagnosis not present

## 2020-02-17 DIAGNOSIS — I4819 Other persistent atrial fibrillation: Secondary | ICD-10-CM

## 2020-02-17 DIAGNOSIS — E782 Mixed hyperlipidemia: Secondary | ICD-10-CM | POA: Diagnosis not present

## 2020-02-17 DIAGNOSIS — I77810 Thoracic aortic ectasia: Secondary | ICD-10-CM | POA: Diagnosis not present

## 2020-02-17 DIAGNOSIS — I340 Nonrheumatic mitral (valve) insufficiency: Secondary | ICD-10-CM | POA: Diagnosis not present

## 2020-02-17 DIAGNOSIS — I255 Ischemic cardiomyopathy: Secondary | ICD-10-CM

## 2020-02-17 DIAGNOSIS — I10 Essential (primary) hypertension: Secondary | ICD-10-CM

## 2020-02-17 MED ORDER — PANTOPRAZOLE SODIUM 40 MG PO TBEC: 40.0000 mg | DELAYED_RELEASE_TABLET | Freq: Every day | ORAL | 3 refills | Status: DC

## 2020-02-17 NOTE — Patient Instructions (Signed)
Medication Instructions:  Your physician has recommended you make the following change in your medication:  1: Start protonix 40 mg daily 2: Take 1/2 tablet of Imdur daily *If you need a refill on your cardiac medications before your next appointment, please call your pharmacy*     Testing/Procedures: Your physician has requested that you have an echocardiogram. Echocardiography is a painless test that uses sound waves to create images of your heart. It provides your doctor with information about the size and shape of your heart and how well your heart's chambers and valves are working. This procedure takes approximately one hour. There are no restrictions for this procedure.    Follow-Up: At [facility], [facility], [facility], you and your health needs are our priority.  As part of our continuing mission to provide you with exceptional heart care, we have created designated Provider Care Teams.  These Care Teams include your primary Cardiologist (physician) and Advanced Practice Providers (APPs -  Physician Assistants and Nurse Practitioners) who all work together to provide you with the care you need, when you need it.  Your next appointment:   4 week(s)  The format for your next appointment:   In Person  Provider:   You will see one of the following Advanced Practice Providers on your designated Care Team:    Melina Copa, PA-C  Ermalinda Barrios, PA-C  Then, Fransico Him, MD will plan to see you again in 6 month(s).

## 2020-02-17 NOTE — Progress Notes (Signed)
Cardiology Office Note:    Date:  02/17/2020   ID:  KRIS NO, DOB [birth year], MRN [id number]  PCP:  [name], MD  Cardiologist:  Fransico Him, MD    Referring MD: [name], MD   Chief Complaint  Patient presents with  . Coronary Artery Disease  . Hypertension  . Hyperlipidemia  . Atrial Fibrillation  . Cardiomyopathy    History of Present Illness:    Sean Willis is a 75 y.o. male with a hx of CAD status post CABG 4 2013 in setting of MI in [place] with initial PTCA of the OM 2 and subsequent CABG with LIMA to the LAD, SVG to diagonal 1, SVG to OM1/R1 and SVG to distal RCA.Other issues include moderately dilated aortic root ([place]), severe OSA with inability to tolerate CPAP, HTN, HLD, chronic AF with prior cardioversion twice in 2018, on chronic anticoagulation with Xarelto. He has failed to maintain NSR and is managed with rate control and did not wish to be on AAD therapy.   Seen in November and was doing ok but had had some indigestion and DOE at times. Wife had just passed with stomach and ovarian cancer. He thought he was depressed. Lexiscan was arranged - this was abnormal -echo also showed decreased EF -cardiac cath was done showing bypass graft failure of the SVG to the RCA along with atresia of the LIMA to the LAD. There is also loss of the the end to side anastomosis of the sequential SVG to the OM. Severe native LAD disease - felt to best manage medically in light of no ischemia on Myoview. Could consider PCI of the proximal LAD.  He has had multiple med changes - started Losartan, then Entresto and Aldactone.The Delene Loll was stopped due to dizziness and hypotension and was transitioned back to Losartan.    Was seen back by Truitt Merle, NP in Dec 2020 and was doing well.  Plan was for repeat 2D echo in March to reassess LVF but this was not done.   He is here today for followup and is doing well.  He tells me that he  has been under a lot of stress due to a terminally ill friend.  He has been having some intermittent CP that is atypical in that it occurs when he lays down at night but he can go outside and exert himself with no problems.  He tells me that he never had CP  With his CAD in the past and his anginal equivalent is left arm pain that he has not had.  He thinks he is having heartburn.  He thinks the Imdur has helped. He has very little DOE and only happens when walking long distances. He denies any PND, orthopnea, LE edema, dizziness, palpitations or syncope. He is compliant with his meds and is tolerating meds with no SE.    Past Medical History:  Diagnosis Date  . Benign essential HTN 11/24/2013  . BPH (benign prostatic hyperplasia)    patient unaware  . CAD (coronary artery disease), native coronary artery 04/2011   S/P CABG X4 Sx [place]  . Carotid artery stenosis    1-39% bilateral by dopplers 2018  . Chest wall pain    Right side, muscular,ortho,MRI. U/S abd ok. On lyrica  . Chronic atrial fibrillation ([place])    s/p DCCV 01/2016, subsequent reversion, patient denied AAD  . Chronic insomnia   . CKD (chronic kidney disease), stage III ([place])   .  Colon polyp 10/11  . DDD (degenerative disc disease), lumbar   . Dilatation of aorta (HCC)    4.5 cm ascending aortic aneurysm per chets ct 05-18-15 epic, diameter of the ascending aorta estimated 44 mm 07/01/19  . ED (erectile dysfunction)   . GERD (gastroesophageal reflux disease)   . Gout   . History of kidney stones   . History of migraine   . Kidney stone    uric acid, Dr [name]  renal insufficiency  . Mitral regurgitation 06/04/2016   Moderate by echo 2020  . Mixed hyperlipidemia 01/23/2013  . Myocardial infarction ([place])   . OSA on CPAP 07/16/2016  . Osteoarthritis    of the knee  . TIA (transient ischemic attack)    questionable Mini stroke per patient    Past Surgical History:  Procedure Laterality Date  . CARDIOVERSION  [name] 01/20/2016   Procedure: CARDIOVERSION;  Surgeon: Thayer Headings, MD;  Location: [facility];  Service: Cardiovascular;  Laterality: [name];  . CARDIOVERSION [name] 03/23/2016   Procedure: CARDIOVERSION;  Surgeon: Sueanne Margarita, MD;  Location: [facility];  Service: Cardiovascular;  Laterality: [name];  . CARDIOVERSION    . CATARACT EXTRACTION W/PHACO Right 10/26/2014   Procedure: CATARACT EXTRACTION PHACO AND INTRAOCULAR LENS PLACEMENT (IOC);  Surgeon: [name], MD;  Location: [facility];  Service: Ophthalmology;  Laterality: Right;  LOT PACK: [id number] H US:01:43.0 AP:27.8 CDE:28.68  . CATARACT EXTRACTION W/PHACO Left 11/13/2016   Procedure: CATARACT EXTRACTION PHACO AND INTRAOCULAR LENS PLACEMENT (IOC);  Surgeon: [name], MD;  Location: [facility];  Service: Ophthalmology;  Laterality: Left;  [place] 01:00.2 AP% 15.7 CDE 9.47 Fluid Pack lot # [id number]  . COLONOSCOPY WITH PROPOFOL [name] 09/27/2015   Procedure: COLONOSCOPY WITH PROPOFOL;  Surgeon: [name], MD;  Location: [facility];  Service: Endoscopy;  Laterality: [name];  . CORONARY ARTERY BYPASS GRAFT  04/2011   Severe multivessel ASCAD s/p Stemi w PTCA of OM2 then s/p CABG w LIMA to LAD,SVG to OM1 /RI and SVG to Distal RCA   . CYSTOSCOPY/URETEROSCOPY/HOLMIUM LASER/STENT PLACEMENT Left 12/21/2019   Procedure: CYSTOSCOPY/RETROGREADE PYLEOGRAM/URETEROSCOPY/HOLMIUM LASER/STENT PLACEMENT;  Surgeon: Janith Lima, MD;  Location: [facility];  Service: Urology;  Laterality: Left;  ONLY NEEDS 60  MIN  . FINGER SURGERY     left finger for ganglion cyst  . KIDNEY STONE SURGERY    . LEFT HEART CATH AND CORS/GRAFTS ANGIOGRAPHY [name] 12/02/2018   Procedure: LEFT HEART CATH AND CORS/GRAFTS ANGIOGRAPHY;  Surgeon: Belva Crome, MD;  Location: [facility];  Service: Cardiovascular;  Laterality: [name];  . TONSILLECTOMY      Current Medications: Current Meds  Medication Sig  . allopurinol (ZYLOPRIM) 100 MG tablet Take 100 mg by mouth daily.   [name] atorvastatin (LIPITOR) 80 MG tablet TAKE 1 TABLET BY MOUTH EVERY DAY  . Butalbital-APAP-Caffeine 50-325-40 MG capsule Take 1 capsule by mouth as needed for migraine.  . docusate sodium (COLACE) 100 MG capsule Take 1 capsule (100 mg total) by mouth daily as needed for up to 30 doses.  [name] doxepin (SINEQUAN) 25 MG capsule Take 25 mg by mouth at bedtime.  [name] escitalopram (LEXAPRO) 10 MG tablet Take 10 mg by mouth daily as needed (depression).  [name] HYDROcodone-acetaminophen (NORCO/VICODIN) 5-325 MG tablet Take 1 tablet by mouth every 6 (six) hours as needed for up to 15 doses for moderate pain.  . isosorbide mononitrate (IMDUR) 30 MG 24 hr tablet Take 0.5 tablets (15 mg total) by mouth daily.  [name]  losartan (COZAAR) 25 MG tablet Take 25 mg by mouth daily as needed (high blood pressure).  . metoprolol tartrate (LOPRESSOR) 50 MG tablet TAKE 1 TABLET BY MOUTH TWICE A DAY  . mometasone (NASONEX) 50 MCG/ACT nasal spray Place 2 sprays into the nose daily as needed (congestion).  . tamsulosin (FLOMAX) 0.4 MG CAPS capsule Take 0.4 mg by mouth daily.  Alveda Reasons 20 MG TABS tablet TAKE 1 TABLET (20 MG TOTAL) BY MOUTH DAILY WITH SUPPER.  [name] zolpidem (AMBIEN) 5 MG tablet Take 5 mg by mouth at bedtime.     Allergies:   Celebrex [celecoxib] and Entresto [sacubitril-valsartan]   Social History   Socioeconomic History  . Marital status: Widowed    Spouse name: Not on file  . Number of children: Not on file  . Years of education: Not on file  . Highest education level: Not on file  Occupational History  . Not on file  Tobacco Use  . Smoking status: Never Smoker  . Smokeless tobacco: Never Used  Vaping Use  . Vaping Use: Never used  Substance and Sexual Activity  . Alcohol use: Yes    Comment: yes occassionally  . Drug use: No  . Sexual activity: Not on file  Other Topics Concern  . Not on file  Social History Narrative  . Not on file   Social Determinants of Health   Financial Resource Strain: Not  on file  Food Insecurity: Not on file  Transportation Needs: Not on file  Physical Activity: Not on file  Stress: Not on file  Social Connections: Not on file     Family History: The patient's family history includes Alzheimer's disease in his mother; Arrhythmia in his father and sister; CVA in his father; Cancer in his mother.  ROS:   Please see the history of present illness.    ROS  All other systems reviewed and negative.   EKGs/Labs/Other Studies Reviewed:    The following studies were reviewed today: none  EKG:  EKG is not ordered today.    Recent Labs: 06/29/2019: ALT 30 12/18/2019: BUN 19; Creatinine, Ser 1.32; Hemoglobin 16.5; Platelets 175; Potassium 4.9; Sodium 138   Recent Lipid Panel    Component Value Date/Time   CHOL 128 06/29/2019 0838   CHOL 123 01/14/2013 0923   TRIG 153 (H) 06/29/2019 0838   TRIG 141 01/14/2013 0923   HDL 31 (L) 06/29/2019 0838   HDL 34 (L) 01/14/2013 0923   CHOLHDL 4.1 06/29/2019 0838   CHOLHDL 5.8 (H) 12/20/2015 0914   VLDL 38 (H) 12/20/2015 0914   LDLCALC 70 06/29/2019 0838   LDLCALC 61 01/14/2013 0923    Physical Exam:    VS:  BP 116/80   Pulse 75   Ht 5\' 9"  (1.753 m)   Wt 194 lb (88 kg)   SpO2 96%   BMI 28.65 kg/m     Wt Readings from Last 3 Encounters:  02/17/20 194 lb (88 kg)  12/21/19 194 lb 3.2 oz (88.1 kg)  12/18/19 191 lb (86.6 kg)     GEN: Well nourished, well developed in no acute distress HEENT: Normal NECK: No JVD; No carotid bruits LYMPHATICS: No lymphadenopathy CARDIAC:RRR, no murmurs, rubs, gallops RESPIRATORY:  Clear to auscultation without rales, wheezing or rhonchi  ABDOMEN: Soft, non-tender, non-distended MUSCULOSKELETAL:  No edema; No deformity  SKIN: Warm and dry NEUROLOGIC:  Alert and oriented x 3 PSYCHIATRIC:  Normal affect    ASSESSMENT:    1. Coronary artery disease  involving native coronary artery of native heart without angina pectoris   2. Benign essential HTN   3. Ischemic  cardiomyopathy   4. Mixed hyperlipidemia   5. Persistent atrial fibrillation ([place])   6. Aortic root dilatation (HCC)   7. Nonrheumatic mitral valve regurgitation    PLAN:    In order of problems listed above:  1.  ASCAD -status post CABG 4 2013 in setting of MI in [place] with initial PTCA of the OM 2 and subsequent CABG with LIMA to the LAD, SVG to diagonal 1, SVG to OM1/R1 and SVG to distal RCA. -repeat cath 12/2018 showed bypass graft failure of the SVG to the RCA along with atresia of the LIMA to the LAD. There is also loss of the the end to side anastomosis of the sequential SVG to the OM. Severe native LAD disease  -felt to best manage medically in light of no ischemia on Myoview.  -no ischemia on nuclear stress test 11/2018 -he has had some atypical CP that I think is likely GERD with possible esophageal spasm.  It only comes on with rest laying down at night and never occurs when walking or working out in the yard -continue ASA, statin and BB -he has only been taking his Imdur when he gets "heartburn" and I have told him to take the Imdur 15mg  daily -start Protonix 40mg  daily  2.  HTN -BP is well controlled on exam today -continue Lopressor 50mg  BID and Losartan 25mg  daily -SCR reviewed on KPN from PCP and was normal at 1 and K+ 4.8  3.  IDCM -2D echo 11/2018 showed moderate LV dysfunction with EF 35-40% with severe LAE, Moderate MR and mild AR.   -no indication for AICD -BP did not tolerate Entresto -continue  Losartan 25mg  daily and Lopressor 50mg  BID  4.  HLD -LDL goal < 70 -LDL 70 in June 2021 -followed by PCP -continue atorvastatin 80mg  daily  5.  Chronic atrial fibrillation -his HR is well controlled on exam today and he denies any palpitations -denies any bleeding on DOAC -continue Lopressor 50mg  BID and Xarelto 20mg  daily -CrCl 50.27 in March 2021  6.  Dilated aorta -moderately dilated at 87mm -followed by Dr. [name] -continue  statin -BP controlled  7.  Mitral regurgitation -moderate on echo 12/2018 -repeat echo to make sure this is stable  Followup with Melina Copa PA in 4 weeks and me in 6 months   Medication Adjustments/Labs and Tests Ordered: Current medicines are reviewed at length with the patient today.  Concerns regarding medicines are outlined above.  No orders of the defined types were placed in this encounter.  No orders of the defined types were placed in this encounter.   Signed, Fransico Him, MD  02/17/2020 11:35 AM    [place]

## 2020-02-17 NOTE — Addendum Note (Signed)
Addended by: Edman Circle on: 02/17/2020 11:49 AM   Modules accepted: Orders

## 2020-03-08 ENCOUNTER — Other Ambulatory Visit: Payer: Self-pay | Admitting: Cardiology

## 2020-03-11 ENCOUNTER — Ambulatory Visit (HOSPITAL_COMMUNITY): Payer: Medicare Other | Attending: Internal Medicine

## 2020-03-11 ENCOUNTER — Other Ambulatory Visit: Payer: Self-pay

## 2020-03-11 DIAGNOSIS — R350 Frequency of micturition: Secondary | ICD-10-CM | POA: Diagnosis not present

## 2020-03-11 DIAGNOSIS — N200 Calculus of kidney: Secondary | ICD-10-CM | POA: Diagnosis not present

## 2020-03-11 DIAGNOSIS — I340 Nonrheumatic mitral (valve) insufficiency: Secondary | ICD-10-CM | POA: Diagnosis not present

## 2020-03-11 DIAGNOSIS — N401 Enlarged prostate with lower urinary tract symptoms: Secondary | ICD-10-CM | POA: Diagnosis not present

## 2020-03-11 DIAGNOSIS — N281 Cyst of kidney, acquired: Secondary | ICD-10-CM | POA: Diagnosis not present

## 2020-03-11 LAB — ECHOCARDIOGRAM COMPLETE: S' Lateral: 4.5 cm

## 2020-03-13 ENCOUNTER — Encounter: Payer: Self-pay | Admitting: Cardiology

## 2020-03-14 DIAGNOSIS — N200 Calculus of kidney: Secondary | ICD-10-CM | POA: Diagnosis not present

## 2020-03-14 NOTE — Progress Notes (Signed)
Cardiology Office Note    Date:  03/16/2020   ID:  [name], DOB 1945-05-24, MRN [id number]   PCP:  [name], [place]  Cardiologist:  Fransico Him, MD  Advanced Practice Provider:  No care team member to display Electrophysiologist:  None   [phone]   Chief Complaint  Patient presents with  . Follow-up    History of Present Illness:  Sean Willis is a 75 y.o. male with a hx of CAD status post CABG 4 2013 in setting of MI in [place], [place] with initial PTCA of the OM 2 and subsequent CABG with LIMA to the LAD, SVG to diagonal 1, SVG to OM1/R1 and SVG to distal RCA. Other issues include moderately dilated aortic root ([place]), severe OSA with inability to tolerate CPAP, HTN, HLD, chronic AF with prior cardioversion twice in 2018, on chronic anticoagulation with Xarelto. He has failed to maintain NSR and is managed with rate control and did not wish to be on AAD therapy.     Chest pain and DOE Lexiscan was arranged - this was abnormal - echo also showed decreased EF - cardiac cath was done showing bypass graft failure of the SVG to the RCA along with atresia of the LIMA to the LAD. There is also loss of the the end to side anastomosis of the sequential SVG to the OM.  Severe native LAD disease - felt to best manage medically in light of no ischemia on Myoview. Could consider PCI of the proximal LAD.   He has had multiple med changes - started Losartan, then Entresto and Aldactone. The Delene Loll was stopped due to dizziness and hypotension and was transitioned back to Losartan.     Patient saw [name] 02/17/20 with some atypical chest pain felt to be GERD. He was taking Imdur when he got heartburn and she asked him to take it daily.Echo 03/11/20-EF 35-40% similar to before.  Patient comes in for f/u. Chest pain improved with Protonix. Says it was GERD. Gets 8000-10,000 steps daily. Chronic DOE. No edema, palpitation.  Stopped his losartan when she asked him to take Imdur daily.   Past Medical History:  Diagnosis Date  . Benign essential HTN 11/24/2013  . BPH (benign prostatic hyperplasia)    patient unaware  . CAD (coronary artery disease), native coronary artery 04/2011   S/P CABG X4 Sx done in [name]  . Carotid artery stenosis    1-39% bilateral by dopplers 2018  . Chest wall pain    Right side, muscular,ortho,MRI. U/S abd ok. On lyrica  . Chronic atrial fibrillation ([place])    s/p DCCV 01/2016, subsequent reversion, patient denied AAD  . Chronic insomnia   . CKD (chronic kidney disease), stage III ([place])   . Colon polyp 10/11  . DDD (degenerative disc disease), lumbar   . Dilatation of aorta (HCC)    aortic root is mildly enlarged at 52mm and moderately enlarged ascending aorta at 35mm by echo 03/2020  . ED (erectile dysfunction)   . GERD (gastroesophageal reflux disease)   . Gout   . History of kidney stones   . History of migraine   . Ischemic cardiomyopathy    EF 35-40% with moderate RV dysfunction by echo 03/2020 and unchanged from echo 2018  . Kidney stone    uric acid, Dr [name]  renal insufficiency  . Mitral regurgitation 06/04/2016   Moderate by echo 2020  . Mixed hyperlipidemia 01/23/2013  .  Myocardial infarction ([place])   . OSA on CPAP 07/16/2016  . Osteoarthritis    of the knee  . TIA (transient ischemic attack)    questionable Mini stroke per patient    Past Surgical History:  Procedure Laterality Date  . CARDIOVERSION [name] 01/20/2016   Procedure: CARDIOVERSION;  Surgeon: Thayer Headings, MD;  Location: [facility];  Service: Cardiovascular;  Laterality: [name];  . CARDIOVERSION [name] 03/23/2016   Procedure: CARDIOVERSION;  Surgeon: Sueanne Margarita, MD;  Location[facility];  Service: Cardiovascular;  Laterality: [name];  . CARDIOVERSION    . CATARACT EXTRACTION W/PHACO Right 10/26/2014   Procedure: CATARACT EXTRACTION PHACO AND INTRAOCULAR LENS PLACEMENT (IOC);  Surgeon:  [name], MD;  Location: [facility];  Service: Ophthalmology;  Laterality: Right;  LOT PACK: [id number] H US:01:43.0 AP:27.8 CDE:28.68  . CATARACT EXTRACTION W/PHACO Left 11/13/2016   Procedure: CATARACT EXTRACTION PHACO AND INTRAOCULAR LENS PLACEMENT (IOC);  Surgeon: [name], MD;  Location: [facility];  Service: Ophthalmology;  Laterality: Left;  [place] 01:00.2 AP% 15.7 CDE 9.47 Fluid Pack lot # [id number]  . COLONOSCOPY WITH PROPOFOL [name] 09/27/2015   Procedure: COLONOSCOPY WITH PROPOFOL;  Surgeon: [name], MD;  Location: [facility];  Service: Endoscopy;  Laterality: [name];  . CORONARY ARTERY BYPASS GRAFT  04/2011   Severe multivessel ASCAD s/p Stemi w PTCA of OM2 then s/p CABG w LIMA to LAD,SVG to OM1 /RI and SVG to Distal RCA   . CYSTOSCOPY/URETEROSCOPY/HOLMIUM LASER/STENT PLACEMENT Left 12/21/2019   Procedure: CYSTOSCOPY/RETROGREADE PYLEOGRAM/URETEROSCOPY/HOLMIUM LASER/STENT PLACEMENT;  Surgeon: Janith Lima, MD;  Location: [facility];  Service: Urology;  Laterality: Left;  ONLY NEEDS 60  MIN  . FINGER SURGERY     left finger for ganglion cyst  . KIDNEY STONE SURGERY    . LEFT HEART CATH AND CORS/GRAFTS ANGIOGRAPHY [name] 12/02/2018   Procedure: LEFT HEART CATH AND CORS/GRAFTS ANGIOGRAPHY;  Surgeon: Belva Crome, MD;  Location: [facility];  Service: Cardiovascular;  Laterality: [name];  . TONSILLECTOMY      Current Medications: Current Meds  Medication Sig  . allopurinol (ZYLOPRIM) 100 MG tablet Take 100 mg by mouth daily.  [name] atorvastatin (LIPITOR) 80 MG tablet TAKE 1 TABLET BY MOUTH EVERY DAY  . Butalbital-APAP-Caffeine 50-325-40 MG capsule Take 1 capsule by mouth as needed for migraine.  . docusate sodium (COLACE) 100 MG capsule Take 1 capsule (100 mg total) by mouth daily as needed for up to 30 doses.  [name] doxepin (SINEQUAN) 25 MG capsule Take 25 mg by mouth at bedtime.  [name] escitalopram (LEXAPRO) 10 MG tablet Take 10 mg by mouth daily as needed (depression).  [name]  HYDROcodone-acetaminophen (NORCO/VICODIN) 5-325 MG tablet Take 1 tablet by mouth every 6 (six) hours as needed for up to 15 doses for moderate pain.  . isosorbide mononitrate (IMDUR) 30 MG 24 hr tablet TAKE 1/2 TABLETS (15 MG TOTAL) BY MOUTH DAILY.  [name] losartan (COZAAR) 25 MG tablet Take 25 mg by mouth daily as needed (high blood pressure).  . metoprolol tartrate (LOPRESSOR) 50 MG tablet TAKE 1 TABLET BY MOUTH TWICE A DAY  . mometasone (NASONEX) 50 MCG/ACT nasal spray Place 2 sprays into the nose daily as needed (congestion).  . nitroGLYCERIN (NITROSTAT) 0.4 MG SL tablet Place 1 tablet (0.4 mg total) under the tongue every 5 (five) minutes as needed for chest pain.  . pantoprazole (PROTONIX) 40 MG tablet Take 1 tablet (40 mg total) by mouth daily.  . tamsulosin (FLOMAX) 0.4 MG CAPS capsule  Take 0.4 mg by mouth daily.  Alveda Reasons 20 MG TABS tablet TAKE 1 TABLET (20 MG TOTAL) BY MOUTH DAILY WITH SUPPER.  [name] zolpidem (AMBIEN) 5 MG tablet Take 5 mg by mouth at bedtime.     Allergies:   Celebrex [celecoxib] and Entresto [sacubitril-valsartan]   Social History   Socioeconomic History  . Marital status: Widowed    Spouse name: Not on file  . Number of children: Not on file  . Years of education: Not on file  . Highest education level: Not on file  Occupational History  . Not on file  Tobacco Use  . Smoking status: Never Smoker  . Smokeless tobacco: Never Used  Vaping Use  . Vaping Use: Never used  Substance and Sexual Activity  . Alcohol use: Yes    Comment: yes occassionally  . Drug use: No  . Sexual activity: Not on file  Other Topics Concern  . Not on file  Social History Narrative  . Not on file   Social Determinants of Health   Financial Resource Strain: Not on file  Food Insecurity: Not on file  Transportation Needs: Not on file  Physical Activity: Not on file  Stress: Not on file  Social Connections: Not on file     Family History:  The patient's family history includes  Alzheimer's disease in his mother; Arrhythmia in his father and sister; CVA in his father; Cancer in his mother.   ROS:   Please see the history of present illness.    ROS All other systems reviewed and are negative.   PHYSICAL EXAM:   VS:  BP 116/82   Pulse 64   Ht 5\' 9"  (1.753 m)   Wt 196 lb (88.9 kg)   SpO2 94%   BMI 28.94 kg/m   Physical Exam  GEN: Well nourished, well developed, in no acute distress  Neck: no JVD, carotid bruits, or masses Cardiac:RRR; no murmurs, rubs, or gallops  Respiratory:  clear to auscultation bilaterally, normal work of breathing GI: soft, nontender, nondistended, + BS Ext: without cyanosis, clubbing, or edema, Good distal pulses bilaterally Neuro:  Alert and Oriented x 3 Psych: euthymic mood, full affect  Wt Readings from Last 3 Encounters:  03/16/20 196 lb (88.9 kg)  02/17/20 194 lb (88 kg)  12/21/19 194 lb 3.2 oz (88.1 kg)      Studies/Labs Reviewed:   EKG:  EKG is not ordered today.    Recent Labs: 06/29/2019: ALT 30 12/18/2019: BUN 19; Creatinine, Ser 1.32; Hemoglobin 16.5; Platelets 175; Potassium 4.9; Sodium 138   Lipid Panel    Component Value Date/Time   CHOL 128 06/29/2019 0838   CHOL 123 01/14/2013 0923   TRIG 153 (H) 06/29/2019 0838   TRIG 141 01/14/2013 0923   HDL 31 (L) 06/29/2019 0838   HDL 34 (L) 01/14/2013 0923   CHOLHDL 4.1 06/29/2019 0838   CHOLHDL 5.8 (H) 12/20/2015 0914   VLDL 38 (H) 12/20/2015 0914   LDLCALC 70 06/29/2019 0838   LDLCALC 61 01/14/2013 0923    Additional studies/ records that were reviewed today include:  Echo 03/11/20 IMPRESSIONS     1. Left ventricular ejection fraction, by estimation, is 35 to 40%. The  left ventricle has moderately decreased function. The left ventricle  demonstrates global hypokinesis. Left ventricular diastolic parameters are  indeterminate.   2. Right ventricular systolic function is moderately reduced. The right  ventricular size is moderately enlarged.   3. Left  atrial size was severely  dilated.   4. Right atrial size was severely dilated.   5. The mitral valve is normal in structure. Mild mitral valve  regurgitation. No evidence of mitral stenosis.   6. The aortic valve is tricuspid. Aortic valve regurgitation is not  visualized. No aortic stenosis is present.   7. Aortic dilatation noted. There is mild dilatation of the aortic root,  measuring 43 mm. There is moderate dilatation of the ascending aorta,  measuring 45 mm.   Comparison(s): A prior study was performed on 11/19/2018. Increase in  atrial size with decrease in right ventricular function.     Risk Assessment/Calculations:         ASSESSMENT:    1. Coronary artery disease involving coronary bypass graft of native heart without angina pectoris   2. Essential hypertension   3. Ischemic cardiomyopathy   4. Permanent atrial fibrillation ([place])   5. Hyperlipidemia, unspecified hyperlipidemia type   6. Aortic root dilatation (HCC)   7. Mitral valve insufficiency, unspecified etiology      PLAN:  In order of problems listed above:  CAD with history of MI in 2013 initially treated with PTCA of the OM 2 and subsequent CABG x4 in 2013 in [place].  Repeat cath 12/2018 graft failure of the SVG to the RCA along with atresia of the LIMA to the LAD and loss of end-to-side anastomosis of the sequential SVG to OM.  Medical management recommended in light of no ischemia on Myoview 11/2018.  Atypical chest pain last office visit asked to take Imdur daily started on Protonix and doing much better  Hypertension BP controlled  Ischemic cardiomyopathy ejection fraction 35 to 40% on echo 11/2018 with moderate MR.  Did not tolerate Entresto and back on losartan but stopped a month ago when started on Imdur. Echo 03/11/20 EF 35-40%. I asked him to restart losartan and refer him to AHF clinic for further treatment.  Permanent atrial fibrillation on DOAC  Hyperlipidemia on lipitor  80 mg LDL 70 06/2019  Aorta 45 mm followed by Dr. Heloise Beecham MR on echo 12/2018  Shared Decision Making/Informed Consent        Medication Adjustments/Labs and Tests Ordered: Current medicines are reviewed at length with the patient today.  Concerns regarding medicines are outlined above.  Medication changes, Labs and Tests ordered today are listed in the Patient Instructions below. Patient Instructions  Medication Instructions:  Resume Losartan 25 mg daily   *If you need a refill on your cardiac medications before your next appointment, please call your pharmacy*   Lab Work: Your physician recommends that you return for lab work in: 2 weeks   If you have labs (blood work) drawn today and your tests are completely normal, you will receive your results only by: [name] MyChart Message (if you have MyChart) OR . A paper copy in the mail If you have any lab test that is abnormal or we need to change your treatment, we will call you to review the results.   Testing/Procedures: None ordered    Follow-Up: At [facility], you and your health needs are our priority.  As part of our continuing mission to provide you with exceptional heart care, we have created designated Provider Care Teams.  These Care Teams include your primary Cardiologist (physician) and Advanced Practice Providers (APPs -  Physician Assistants and Nurse Practitioners) who all work together to provide you with the care you need, when you need it.  We recommend signing up for  the patient portal called "MyChart".  Sign up information is provided on this After Visit Summary.  MyChart is used to connect with patients for Virtual Visits (Telemedicine).  Patients are able to view lab/test results, encounter notes, upcoming appointments, etc.  Non-urgent messages can be sent to your provider as well.   To learn more about what you can do with MyChart, go to [email or web address].    Your next appointment:   2  month(s)  The format for your next appointment:   In Person  Provider:   You may see Fransico Him, MD or one of the following Advanced Practice Providers on your designated Care Team:    Melina Copa, PA-C  Ermalinda Barrios, PA-C    Other Instructions Your physician has referred you to the heart failure clinic      Signed, Ermalinda Barrios, PA-C  03/16/2020 1:22 PM    [address], [place], [place]  [address] Phone: [phone]; Fax: [phone]

## 2020-03-16 ENCOUNTER — Other Ambulatory Visit: Payer: Self-pay

## 2020-03-16 ENCOUNTER — Ambulatory Visit (INDEPENDENT_AMBULATORY_CARE_PROVIDER_SITE_OTHER): Payer: Medicare Other | Admitting: Physician Assistant

## 2020-03-16 ENCOUNTER — Encounter: Payer: Self-pay | Admitting: Physician Assistant

## 2020-03-16 VITALS — BP 116/82 | HR 64 | Ht 69.0 in | Wt 196.0 lb

## 2020-03-16 DIAGNOSIS — I10 Essential (primary) hypertension: Secondary | ICD-10-CM

## 2020-03-16 DIAGNOSIS — I340 Nonrheumatic mitral (valve) insufficiency: Secondary | ICD-10-CM | POA: Diagnosis not present

## 2020-03-16 DIAGNOSIS — I77810 Thoracic aortic ectasia: Secondary | ICD-10-CM

## 2020-03-16 DIAGNOSIS — I255 Ischemic cardiomyopathy: Secondary | ICD-10-CM

## 2020-03-16 DIAGNOSIS — I25810 Atherosclerosis of coronary artery bypass graft(s) without angina pectoris: Secondary | ICD-10-CM | POA: Diagnosis not present

## 2020-03-16 DIAGNOSIS — I4821 Permanent atrial fibrillation: Secondary | ICD-10-CM

## 2020-03-16 DIAGNOSIS — E785 Hyperlipidemia, unspecified: Secondary | ICD-10-CM

## 2020-03-16 NOTE — Patient Instructions (Signed)
Medication Instructions:  Resume Losartan 25 mg daily   *If you need a refill on your cardiac medications before your next appointment, please call your pharmacy*   Lab Work: Your physician recommends that you return for lab work in: 2 weeks   If you have labs (blood work) drawn today and your tests are completely normal, you will receive your results only by: [name] MyChart Message (if you have MyChart) OR . A paper copy in the mail If you have any lab test that is abnormal or we need to change your treatment, we will call you to review the results.   Testing/Procedures: None ordered    Follow-Up: At [facility], you and your health needs are our priority.  As part of our continuing mission to provide you with exceptional heart care, we have created designated Provider Care Teams.  These Care Teams include your primary Cardiologist (physician) and Advanced Practice Providers (APPs -  Physician Assistants and Nurse Practitioners) who all work together to provide you with the care you need, when you need it.  We recommend signing up for the patient portal called "MyChart".  Sign up information is provided on this After Visit Summary.  MyChart is used to connect with patients for Virtual Visits (Telemedicine).  Patients are able to view lab/test results, encounter notes, upcoming appointments, etc.  Non-urgent messages can be sent to your provider as well.   To learn more about what you can do with MyChart, go to [email or web address].    Your next appointment:   2 month(s)  The format for your next appointment:   In Person  Provider:   You may see Fransico Him, MD or one of the following Advanced Practice Providers on your designated Care Team:    Melina Copa, PA-C  Ermalinda Barrios, PA-C    Other Instructions Your physician has referred you to the heart failure clinic

## 2020-03-31 DIAGNOSIS — H26492 Other secondary cataract, left eye: Secondary | ICD-10-CM | POA: Diagnosis not present

## 2020-04-01 ENCOUNTER — Other Ambulatory Visit: Payer: Self-pay

## 2020-04-01 ENCOUNTER — Other Ambulatory Visit: Payer: Medicare Other | Admitting: *Deleted

## 2020-04-01 DIAGNOSIS — I10 Essential (primary) hypertension: Secondary | ICD-10-CM

## 2020-04-01 LAB — BASIC METABOLIC PANEL
BUN/Creatinine Ratio: 13 (ref 10–24)
BUN: 15 mg/dL (ref 8–27)
CO2: 21 mmol/L (ref 20–29)
Calcium: 9.1 mg/dL (ref 8.6–10.2)
Chloride: 103 mmol/L (ref 96–106)
Creatinine, Ser: 1.2 mg/dL (ref 0.76–1.27)
Glucose: 110 mg/dL — ABNORMAL HIGH (ref 65–99)
Potassium: 4.1 mmol/L (ref 3.5–5.2)
Sodium: 139 mmol/L (ref 134–144)
eGFR: 63 mL/min/{1.73_m2} (ref >59)

## 2020-04-10 ENCOUNTER — Other Ambulatory Visit: Payer: Self-pay | Admitting: Cardiology

## 2020-04-11 NOTE — Telephone Encounter (Signed)
Pt's age 75, wt 88.9 kg, SCr 1.2, CrCl 67.91, last ov w/ ML 03/16/20.

## 2020-04-21 DIAGNOSIS — N183 Chronic kidney disease, stage 3 unspecified: Secondary | ICD-10-CM | POA: Diagnosis not present

## 2020-04-21 DIAGNOSIS — I4891 Unspecified atrial fibrillation: Secondary | ICD-10-CM | POA: Diagnosis not present

## 2020-04-21 DIAGNOSIS — I10 Essential (primary) hypertension: Secondary | ICD-10-CM | POA: Diagnosis not present

## 2020-04-21 DIAGNOSIS — Z0000 Encounter for general adult medical examination without abnormal findings: Secondary | ICD-10-CM | POA: Diagnosis not present

## 2020-04-21 DIAGNOSIS — E782 Mixed hyperlipidemia: Secondary | ICD-10-CM | POA: Diagnosis not present

## 2020-04-21 DIAGNOSIS — I252 Old myocardial infarction: Secondary | ICD-10-CM | POA: Diagnosis not present

## 2020-04-21 DIAGNOSIS — I712 Thoracic aortic aneurysm, without rupture: Secondary | ICD-10-CM | POA: Diagnosis not present

## 2020-04-21 DIAGNOSIS — M109 Gout, unspecified: Secondary | ICD-10-CM | POA: Diagnosis not present

## 2020-04-21 DIAGNOSIS — I25810 Atherosclerosis of coronary artery bypass graft(s) without angina pectoris: Secondary | ICD-10-CM | POA: Diagnosis not present

## 2020-04-21 DIAGNOSIS — R7303 Prediabetes: Secondary | ICD-10-CM | POA: Diagnosis not present

## 2020-04-21 DIAGNOSIS — F411 Generalized anxiety disorder: Secondary | ICD-10-CM | POA: Diagnosis not present

## 2020-04-21 DIAGNOSIS — G43909 Migraine, unspecified, not intractable, without status migrainosus: Secondary | ICD-10-CM | POA: Diagnosis not present

## 2020-05-16 ENCOUNTER — Ambulatory Visit: Payer: Medicare Other | Admitting: Cardiology

## 2020-06-10 ENCOUNTER — Other Ambulatory Visit: Payer: Self-pay | Admitting: Cardiology

## 2020-06-15 ENCOUNTER — Encounter: Payer: Self-pay | Admitting: Cardiology

## 2020-06-15 ENCOUNTER — Ambulatory Visit (INDEPENDENT_AMBULATORY_CARE_PROVIDER_SITE_OTHER): Payer: Medicare Other | Admitting: Cardiology

## 2020-06-15 ENCOUNTER — Other Ambulatory Visit: Payer: Self-pay

## 2020-06-15 VITALS — BP 108/70 | HR 80 | Ht 69.0 in | Wt 192.6 lb

## 2020-06-15 DIAGNOSIS — I340 Nonrheumatic mitral (valve) insufficiency: Secondary | ICD-10-CM | POA: Diagnosis not present

## 2020-06-15 DIAGNOSIS — I10 Essential (primary) hypertension: Secondary | ICD-10-CM | POA: Diagnosis not present

## 2020-06-15 DIAGNOSIS — I255 Ischemic cardiomyopathy: Secondary | ICD-10-CM

## 2020-06-15 DIAGNOSIS — E782 Mixed hyperlipidemia: Secondary | ICD-10-CM

## 2020-06-15 DIAGNOSIS — I2510 Atherosclerotic heart disease of native coronary artery without angina pectoris: Secondary | ICD-10-CM | POA: Diagnosis not present

## 2020-06-15 DIAGNOSIS — I77810 Thoracic aortic ectasia: Secondary | ICD-10-CM | POA: Diagnosis not present

## 2020-06-15 DIAGNOSIS — I482 Chronic atrial fibrillation, unspecified: Secondary | ICD-10-CM

## 2020-06-15 LAB — LIPID PANEL
Chol/HDL Ratio: 7.2 ratio — ABNORMAL HIGH (ref 0.0–5.0)
Cholesterol, Total: 210 mg/dL — ABNORMAL HIGH (ref 100–199)
HDL: 29 mg/dL — ABNORMAL LOW (ref >39)
LDL Chol Calc (NIH): 125 mg/dL — ABNORMAL HIGH (ref 0–99)
Triglycerides: 316 mg/dL — ABNORMAL HIGH (ref 0–149)
VLDL Cholesterol Cal: 56 mg/dL — ABNORMAL HIGH (ref 5–40)

## 2020-06-15 LAB — COMPREHENSIVE METABOLIC PANEL
ALT: 26 IU/L (ref 0–44)
AST: 21 IU/L (ref 0–40)
Albumin/Globulin Ratio: 1.8 (ref 1.2–2.2)
Albumin: 4.5 g/dL (ref 3.7–4.7)
Alkaline Phosphatase: 92 IU/L (ref 44–121)
BUN/Creatinine Ratio: 13 (ref 10–24)
BUN: 17 mg/dL (ref 8–27)
Bilirubin Total: 0.6 mg/dL (ref 0.0–1.2)
CO2: 21 mmol/L (ref 20–29)
Calcium: 9.4 mg/dL (ref 8.6–10.2)
Chloride: 104 mmol/L (ref 96–106)
Creatinine, Ser: 1.36 mg/dL — ABNORMAL HIGH (ref 0.76–1.27)
Globulin, Total: 2.5 g/dL (ref 1.5–4.5)
Glucose: 129 mg/dL — ABNORMAL HIGH (ref 65–99)
Potassium: 4.6 mmol/L (ref 3.5–5.2)
Sodium: 139 mmol/L (ref 134–144)
Total Protein: 7 g/dL (ref 6.0–8.5)
eGFR: 55 mL/min/{1.73_m2} — ABNORMAL LOW (ref >59)

## 2020-06-15 MED ORDER — ISOSORBIDE MONONITRATE ER 30 MG PO TB24: 15.0000 mg | ORAL_TABLET | Freq: Every day | ORAL | 1 refills | Status: DC

## 2020-06-15 MED ORDER — ATORVASTATIN CALCIUM 80 MG PO TABS: 1.0000 | ORAL_TABLET | Freq: Every day | ORAL | 1 refills | Status: DC

## 2020-06-15 MED ORDER — RIVAROXABAN 20 MG PO TABS: 20.0000 mg | ORAL_TABLET | Freq: Every day | ORAL | 1 refills | Status: DC

## 2020-06-15 MED ORDER — LOSARTAN POTASSIUM 25 MG PO TABS: 25.0000 mg | ORAL_TABLET | Freq: Every day | ORAL | 1 refills | Status: DC | PRN

## 2020-06-15 MED ORDER — METOPROLOL TARTRATE 50 MG PO TABS: 50.0000 mg | ORAL_TABLET | Freq: Two times a day (BID) | ORAL | 1 refills | Status: DC

## 2020-06-15 NOTE — Patient Instructions (Addendum)
Medication Instructions:  Your physician recommends that you continue on your current medications as directed. Please refer to the Current Medication list given to you today.  *If you need a refill on your cardiac medications before your next appointment, please call your pharmacy*   Lab Work: TODAY: CMET and FLP  If you have labs (blood work) drawn today and your tests are completely normal, you will receive your results only by: [place] (if you have MyChart) OR A paper copy in the mail If you have any lab test that is abnormal or we need to change your treatment, we will call you to review the results.  Follow-Up: At [facility], you and your health needs are our priority.  As part of our continuing mission to provide you with exceptional heart care, we have created designated Provider Care Teams.  These Care Teams include your primary Cardiologist (physician) and Advanced Practice Providers (APPs -  Physician Assistants and Nurse Practitioners) who all work together to provide you with the care you need, when you need it.  Your next appointment:   6 month(s)  The format for your next appointment:   In Person  Provider:   You may see Fransico Him, MD or one of the following Advanced Practice Providers on your designated Care Team:   Melina Copa, PA-C Ermalinda Barrios, PA-C

## 2020-06-15 NOTE — Addendum Note (Signed)
Addended by: Antonieta Iba on: 06/15/2020 10:02 AM   Modules accepted: Orders

## 2020-06-15 NOTE — Progress Notes (Addendum)
Cardiology Office Note:    Date:  06/15/2020   ID:  Sean Willis, DOB 23-Mar-1945, MRN [id number]  PCP:  [name], MD  Cardiologist:  Fransico Him, MD    Referring MD: [name], MD   Chief Complaint  Patient presents with   Coronary Artery Disease   Hypertension   Hyperlipidemia   Atrial Fibrillation   Cardiomyopathy   Mitral Regurgitation     History of Present Illness:    Sean Willis is a 75 y.o. male with a hx of CAD status post CABG 4 2013 in setting of MI in [place], [place] with initial PTCA of the OM 2 and subsequent CABG with LIMA to the LAD, SVG to diagonal 1, SVG to OM1/R1 and SVG to distal RCA. Other issues include moderately dilated aortic root ([place]), severe OSA with inability to tolerate CPAP, HTN, HLD, chronic AF with prior cardioversion twice in 2018, on chronic anticoagulation with Xarelto. He has failed to maintain NSR and is managed with rate control and did not wish to be on AAD therapy.    Seen in November and was doing ok but had had some indigestion and DOE at times. Wife had just passed with stomach and ovarian cancer. He thought he was depressed. Lexiscan was arranged - this was abnormal - echo also showed decreased EF - cardiac cath was done showing bypass graft failure of the SVG to the RCA along with atresia of the LIMA to the LAD. There is also loss of the the end to side anastomosis of the sequential SVG to the OM.  Severe native LAD disease - felt to best manage medically in light of no ischemia on Myoview. Could consider PCI of the proximal LAD.   He has had multiple med changes - started Losartan, then Entresto and Aldactone. The Delene Loll was stopped due to dizziness and hypotension and was transitioned back to Losartan.     He is here today for followup and is doing well.  He denies any chest pain or pressure, SOB, DOE(except when working out in the heat and humidity), PND, orthopnea, LE edema, dizziness, palpitations  or syncope. He is compliant with his meds and is tolerating meds with no SE.     Past Medical History:  Diagnosis Date   Benign essential HTN 11/24/2013   BPH (benign prostatic hyperplasia)    patient unaware   CAD (coronary artery disease), native coronary artery 04/2011   S/P CABG X4 Sx done in [place]   Carotid artery stenosis    1-39% bilateral by dopplers 2018   Chest wall pain    Right side, muscular,ortho,MRI. U/S abd ok. On lyrica   Chronic atrial fibrillation ([place])    s/p DCCV 01/2016, subsequent reversion, patient denied AAD   Chronic insomnia    CKD (chronic kidney disease), stage III (HCC)    Colon polyp 10/11   DDD (degenerative disc disease), lumbar    Dilatation of aorta (HCC)    aortic root is mildly enlarged at 1mm and moderately enlarged ascending aorta at 64mm by echo 03/2020   ED (erectile dysfunction)    GERD (gastroesophageal reflux disease)    Gout    History of kidney stones    History of migraine    Ischemic cardiomyopathy    EF 35-40% with moderate RV dysfunction by echo 03/2020 and unchanged from echo 2018   Kidney stone    uric acid, Dr [name]  renal insufficiency   Mitral regurgitation 06/04/2016  Moderate by echo 2020   Mixed hyperlipidemia 01/23/2013   Myocardial infarction ([place])    OSA on CPAP 07/16/2016   Osteoarthritis    of the knee   TIA (transient ischemic attack)    questionable Mini stroke per patient    Past Surgical History:  Procedure Laterality Date   CARDIOVERSION [name] 01/20/2016   Procedure: CARDIOVERSION;  Surgeon: Thayer Headings, MD;  Location: [facility];  Service: Cardiovascular;  Laterality: [name];   CARDIOVERSION [name] 03/23/2016   Procedure: CARDIOVERSION;  Surgeon: Sueanne Margarita, MD;  Location: [facility];  Service: Cardiovascular;  Laterality: [name];   CARDIOVERSION     CATARACT EXTRACTION W/PHACO Right 10/26/2014   Procedure: CATARACT EXTRACTION PHACO AND INTRAOCULAR LENS PLACEMENT (Impact);  Surgeon: [name], MD;  Location: [facility];  Service: Ophthalmology;  Laterality: Right;  LOT PACK: [id number] H US:01:43.0 AP:27.8 CDE:28.68   CATARACT EXTRACTION W/PHACO Left 11/13/2016   Procedure: CATARACT EXTRACTION PHACO AND INTRAOCULAR LENS PLACEMENT (IOC);  Surgeon: [name], MD;  Location: [facility];  Service: Ophthalmology;  Laterality: Left;  [place] 01:00.2 AP% 15.7 CDE 9.47 Fluid Pack lot # [id number]   COLONOSCOPY WITH PROPOFOL [name] 09/27/2015   Procedure: COLONOSCOPY WITH PROPOFOL;  Surgeon: [name], MD;  Location: [facility];  Service: Endoscopy;  Laterality: [name];   CORONARY ARTERY BYPASS GRAFT  04/2011   Severe multivessel ASCAD s/p Stemi w PTCA of OM2 then s/p CABG w LIMA to LAD,SVG to OM1 /RI and SVG to Distal RCA    CYSTOSCOPY/URETEROSCOPY/HOLMIUM LASER/STENT PLACEMENT Left 12/21/2019   Procedure: CYSTOSCOPY/RETROGREADE PYLEOGRAM/URETEROSCOPY/HOLMIUM LASER/STENT PLACEMENT;  Surgeon: Janith Lima, MD;  Location: [facility];  Service: Urology;  Laterality: Left;  ONLY NEEDS 60  MIN   FINGER SURGERY     left finger for ganglion cyst   KIDNEY STONE SURGERY     LEFT HEART CATH AND CORS/GRAFTS ANGIOGRAPHY [name] 12/02/2018   Procedure: LEFT HEART CATH AND CORS/GRAFTS ANGIOGRAPHY;  Surgeon: Belva Crome, MD;  Location: [facility];  Service: Cardiovascular;  Laterality: [name];   TONSILLECTOMY      Current Medications: Current Meds  Medication Sig   allopurinol (ZYLOPRIM) 100 MG tablet Take 100 mg by mouth daily.   atorvastatin (LIPITOR) 80 MG tablet TAKE 1 TABLET BY MOUTH EVERY DAY   Butalbital-APAP-Caffeine 50-325-40 MG capsule Take 1 capsule by mouth as needed for migraine.   docusate sodium (COLACE) 100 MG capsule Take 1 capsule (100 mg total) by mouth daily as needed for up to 30 doses.   doxepin (SINEQUAN) 25 MG capsule Take 25 mg by mouth at bedtime.   escitalopram (LEXAPRO) 10 MG tablet Take 10 mg by mouth daily as needed (depression).   HYDROcodone-acetaminophen  (NORCO/VICODIN) 5-325 MG tablet Take 1 tablet by mouth every 6 (six) hours as needed for up to 15 doses for moderate pain.   isosorbide mononitrate (IMDUR) 30 MG 24 hr tablet TAKE 1/2 TABLETS (15 MG TOTAL) BY MOUTH DAILY.   losartan (COZAAR) 25 MG tablet Take 25 mg by mouth daily as needed (high blood pressure).   metoprolol tartrate (LOPRESSOR) 50 MG tablet TAKE 1 TABLET BY MOUTH TWICE A DAY   mometasone (NASONEX) 50 MCG/ACT nasal spray Place 2 sprays into the nose daily as needed (congestion).   pantoprazole (PROTONIX) 40 MG tablet Take 1 tablet (40 mg total) by mouth daily.   tamsulosin (FLOMAX) 0.4 MG CAPS capsule Take 0.4 mg by mouth daily.   XARELTO 20 MG TABS tablet TAKE 1  TABLET (20 MG TOTAL) BY MOUTH DAILY WITH SUPPER.   zolpidem (AMBIEN) 5 MG tablet Take 5 mg by mouth at bedtime.     Allergies:   Celebrex [celecoxib] and Entresto [sacubitril-valsartan]   Social History   Socioeconomic History   Marital status: Widowed    Spouse name: Not on file   Number of children: Not on file   Years of education: Not on file   Highest education level: Not on file  Occupational History   Not on file  Tobacco Use   Smoking status: Never   Smokeless tobacco: Never  Vaping Use   Vaping Use: Never used  Substance and Sexual Activity   Alcohol use: Yes    Comment: yes occassionally   Drug use: No   Sexual activity: Not on file  Other Topics Concern   Not on file  Social History Narrative   Not on file   Social Determinants of Health   Financial Resource Strain: Not on file  Food Insecurity: Not on file  Transportation Needs: Not on file  Physical Activity: Not on file  Stress: Not on file  Social Connections: Not on file     Family History: The patient's family history includes Alzheimer's disease in his mother; Arrhythmia in his father and sister; CVA in his father; Cancer in his mother.  ROS:   Please see the history of present illness.    ROS  All other systems  reviewed and negative.   EKGs/Labs/Other Studies Reviewed:    The following studies were reviewed today: none  EKG:  EKG is not ordered today.    Recent Labs: 06/29/2019: ALT 30 12/18/2019: Hemoglobin 16.5; Platelets 175 04/01/2020: BUN 15; Creatinine, Ser 1.20; Potassium 4.1; Sodium 139   Recent Lipid Panel    Component Value Date/Time   CHOL 128 06/29/2019 0838   CHOL 123 01/14/2013 0923   TRIG 153 (H) 06/29/2019 0838   TRIG 141 01/14/2013 0923   HDL 31 (L) 06/29/2019 0838   HDL 34 (L) 01/14/2013 0923   CHOLHDL 4.1 06/29/2019 0838   CHOLHDL 5.8 (H) 12/20/2015 0914   VLDL 38 (H) 12/20/2015 0914   LDLCALC 70 06/29/2019 0838   LDLCALC 61 01/14/2013 0923    Physical Exam:    VS:  BP 108/70   Pulse 80   Ht 5\' 9"  (1.753 m)   Wt 192 lb 9.6 oz (87.4 kg)   SpO2 95%   BMI 28.44 kg/m     Wt Readings from Last 3 Encounters:  06/15/20 192 lb 9.6 oz (87.4 kg)  03/16/20 196 lb (88.9 kg)  02/17/20 194 lb (88 kg)    GEN: Well nourished, well developed in no acute distress HEENT: Normal NECK: No JVD; No carotid bruits LYMPHATICS: No lymphadenopathy CARDIAC:irregularly irrgular no murmurs, rubs, gallops RESPIRATORY:  Clear to auscultation without rales, wheezing or rhonchi  ABDOMEN: Soft, non-tender, non-distended MUSCULOSKELETAL:  No edema; No deformity  SKIN: Warm and dry NEUROLOGIC:  Alert and oriented x 3 PSYCHIATRIC:  Normal affect   ASSESSMENT:    1. Coronary artery disease involving native coronary artery of native heart without angina pectoris   2. Benign essential HTN   3. Ischemic cardiomyopathy   4. Mixed hyperlipidemia   5. Chronic atrial fibrillation (HCC)   6. Aortic root dilatation (HCC)   7. Nonrheumatic mitral valve regurgitation    PLAN:    In order of problems listed above:  1.  ASCAD -status post CABG 4 2013 in setting of MI  in [place], [place] with initial PTCA of the OM 2 and subsequent CABG with LIMA to the LAD, SVG to diagonal 1,  SVG to OM1/R1 and SVG to distal RCA. -repeat cath 12/2018 showed bypass graft failure of the SVG to the RCA along with atresia of the LIMA to the LAD. There is also loss of the the end to side anastomosis of the sequential SVG to the OM.  Severe native LAD disease  -felt to best manage medically in light of no ischemia on Myoview.  -no ischemia on nuclear stress test 11/2018 -he denies any anginal symptoms -no ASA due to DOAC -continue Imdur 30mg  daily, statin and BB   2.  HTN -BP is adequately controlled on exam today -Continue prescription drug management with Lopressor 50mg  BID and Losartan 25mg  daily >>refilled for 6 months -I have personally reviewed and interpreted outside labs performed by patient's PCP which showed SCr 1.35, K+ 4.2 in April 2022   3.  IDCM -2D echo  showed moderate LV and RV dysfunction with EF 35-40% with severe LAE, mild MR  -no indication for AICD -BP did not tolerate Entresto -he appears euvolemic on exam today and weight is stable -Continue prescription drug management with losartan 25mg  daily and Lopressor 50mg  BID >>refilled for 6 months   4.  HLD -LDL goal < 70 -I have personally reviewed and interpreted outside labs performed by patient's PCP which showed LDL 116, HLD 25, TAGS 466 (nonfasting), ALT 17 in April  -check FLP and ALT -Continue prescription drug management with Atorvastatin 80mg  daily >>refilled for 6 months   5.  Chronic atrial fibrillation -his HR remains well controlled on exam today and he denies any palpitations -denies any bleeding on DOAC -I have personally reviewed and interpreted outside labs performed by patient's PCP which showed Hbg 15.6  -repeat BMET today to assess CrCl -Continue prescription drug management with Lopressor 50mg  BID and Xarelto 20mg  daily  >>refilled for 6 months   6.  Dilated aorta -moderately dilated at 6mm -followed by Dr. [name] -continue statin -BP controlled  7.  Mitral regurgitation -moderate  on echo 12/2018 but mild on repeat echo 03/2020  Followup 6 months   Medication Adjustments/Labs and Tests Ordered: Current medicines are reviewed at length with the patient today.  Concerns regarding medicines are outlined above.  No orders of the defined types were placed in this encounter.  No orders of the defined types were placed in this encounter.   Signed, Fransico Him, MD  06/15/2020 9:46 AM    [facility]

## 2020-06-22 ENCOUNTER — Telehealth: Payer: Self-pay

## 2020-06-22 NOTE — Telephone Encounter (Signed)
-----   Message from Rollen Sox, [facility] sent at 06/16/2020 11:51 AM EDT ----- LDL increased significantly while still taking atorvastatin 80mg .  Had he been taking as directed?  If so, recommend referral to lipid clinic for [facility]

## 2020-06-22 NOTE — Telephone Encounter (Signed)
Spoke with the patient who states that he has not been taking his atorvastatin regularly. He states that it has been causing him muscle pains. I have scheduled him for an office visit with PharmD in lipid clinic.

## 2020-07-09 DIAGNOSIS — Z20822 Contact with and (suspected) exposure to covid-19: Secondary | ICD-10-CM | POA: Diagnosis not present

## 2020-07-11 ENCOUNTER — Ambulatory Visit (INDEPENDENT_AMBULATORY_CARE_PROVIDER_SITE_OTHER): Payer: Medicare Other | Admitting: Pharmacist

## 2020-07-11 ENCOUNTER — Other Ambulatory Visit: Payer: Self-pay

## 2020-07-11 DIAGNOSIS — E782 Mixed hyperlipidemia: Secondary | ICD-10-CM | POA: Diagnosis not present

## 2020-07-11 DIAGNOSIS — I255 Ischemic cardiomyopathy: Secondary | ICD-10-CM | POA: Diagnosis not present

## 2020-07-11 MED ORDER — ROSUVASTATIN CALCIUM 20 MG PO TABS: 20.0000 mg | ORAL_TABLET | Freq: Every day | ORAL | 3 refills | Status: DC

## 2020-07-11 MED ORDER — VASCEPA 1 G PO CAPS
2.0000 g | ORAL_CAPSULE | Freq: Two times a day (BID) | ORAL | 3 refills | Status: DC
Start: 2020-07-11 — End: 2022-05-29

## 2020-07-11 NOTE — Patient Instructions (Addendum)
Your LDL cholesterol is 125 and your goal is < 70 -Stop atorvastatin -Start rosuvastatin 20mg - 1 tablet once daily  Your triglycerides are 316 and your goal is < 150 -Start Vascepa - 2 capsules twice daily with food  Recheck fasting cholesterol in 2 months on Monday September 12th any time after 7:30am  Call Jinny Blossom, PharmD with any trouble tolerating your medication [phone]

## 2020-07-11 NOTE — Progress Notes (Signed)
Patient ID: Sean Willis                 DOB: Jun 24, 1945                    MRN: [id number]     HPI: AAHIL FREDIN is a 75 y.o. male patient referred to lipid clinic by Dr Radford Pax. PMH is significant for CAD s/p CABG x4v in 2013 in setting of MI, severe OSA, HTN, HLD, afib, and moderately dilated aortic root. Cath 12/2018 showed bypass graft failure with occlusion of saphenous vein graft to right coronary and atresia internal mammary graft ot LAD, severely diseased prox to distal LAD with 60-80% prox to mid followed by diffuse disease up to 90% in mid to distal vessel, totally occluded ramus intermedius and dominant obtuse marginal, EF 35-40%. CAD to be managed medically in light of no ischemia on myoview.  Pt presents today in good spirits. He reported slow onset of joint pain in his hands and feet with atorvastatin that occurred after 6-9 months of therapy. He had x-rays done and was evaluated for gout but this was negative. He ran out of atorvastatin earlier this year and noticed that his joint pain resolved when he stopped his statin. Previously took pravastatin and had no trouble tolerating therapy, this was just changed ot high intensity statin therapy due to his cardiac history. He was not fasting when most recent labs were checked either. He decreased his atorvastatin dose to 40mg  daily over the past month and has been feeling better on this.  Current Medications: none Intolerances: atorvastatin 80mg  daily - myalgias, pravastatin 20-80mg  daily - changed to more potent statin Risk Factors: CAD s/p CABG LDL goal: 70mg /dL  Diet: Eating more oatmeal for breakfast 3 days a week, otherwise ham biscuit and unsweet tea. Lunch - sandwich, burger, or hotdog. Dinner - [employer] for vegetables. Diet soda. No alcohol, trying to drink more water. Snacks on fruit. Does like potatoes and bread.   Exercise: [place] active [place], golfs  Family History: Alzheimer's disease in his mother;  Arrhythmia in his father and sister; CVA in his father; Cancer in his mother.  Social History: Occasional alcohol use, denies drug and tobacco use.  Labs: 06/15/20: TC 210, TG 316, HDL 29, LDL 125 (no LLT) 06/29/19:  TC 128, TG 153, HDL 33, LDL 70 (atorvastatin 80mg  daily)  Past Medical History:  Diagnosis Date   Benign essential HTN 11/24/2013   BPH (benign prostatic hyperplasia)    patient unaware   CAD (coronary artery disease), native coronary artery 04/2011   S/P CABG X4 Sx done in [place]   Carotid artery stenosis    1-39% bilateral by dopplers 2018   Chest wall pain    Right side, muscular,ortho,MRI. U/S abd ok. On lyrica   Chronic atrial fibrillation ([place])    s/p DCCV 01/2016, subsequent reversion, patient denied AAD   Chronic insomnia    CKD (chronic kidney disease), stage III (HCC)    Colon polyp 10/11   DDD (degenerative disc disease), lumbar    Dilatation of aorta (HCC)    aortic root is mildly enlarged at 69mm and moderately enlarged ascending aorta at 2mm by echo 03/2020   ED (erectile dysfunction)    GERD (gastroesophageal reflux disease)    Gout    History of kidney stones    History of migraine    Ischemic cardiomyopathy    EF 35-40% with moderate RV dysfunction by  echo 03/2020 and unchanged from echo 2018   Kidney stone    uric acid, Dr [name]  renal insufficiency   Mitral regurgitation 06/04/2016   Moderate by echo 2020   Mixed hyperlipidemia 01/23/2013   Myocardial infarction ([place])    OSA on CPAP 07/16/2016   Osteoarthritis    of the knee   TIA (transient ischemic attack)    questionable Mini stroke per patient    Current Outpatient Medications on File Prior to Visit  Medication Sig Dispense Refill   allopurinol (ZYLOPRIM) 100 MG tablet Take 100 mg by mouth daily.     atorvastatin (LIPITOR) 80 MG tablet Take 1 tablet (80 mg total) by mouth daily. 90 tablet 1   Butalbital-APAP-Caffeine 50-325-40 MG capsule Take 1 capsule by mouth as needed for  migraine.     docusate sodium (COLACE) 100 MG capsule Take 1 capsule (100 mg total) by mouth daily as needed for up to 30 doses. 30 capsule 0   doxepin (SINEQUAN) 25 MG capsule Take 25 mg by mouth at bedtime.     escitalopram (LEXAPRO) 10 MG tablet Take 10 mg by mouth daily as needed (depression).     HYDROcodone-acetaminophen (NORCO/VICODIN) 5-325 MG tablet Take 1 tablet by mouth every 6 (six) hours as needed for up to 15 doses for moderate pain. 15 tablet 0   isosorbide mononitrate (IMDUR) 30 MG 24 hr tablet Take 0.5 tablets (15 mg total) by mouth daily. 45 tablet 1   losartan (COZAAR) 25 MG tablet Take 1 tablet (25 mg total) by mouth daily as needed (high blood pressure). 90 tablet 1   metoprolol tartrate (LOPRESSOR) 50 MG tablet Take 1 tablet (50 mg total) by mouth 2 (two) times daily. 180 tablet 1   mometasone (NASONEX) 50 MCG/ACT nasal spray Place 2 sprays into the nose daily as needed (congestion).     nitroGLYCERIN (NITROSTAT) 0.4 MG SL tablet Place 1 tablet (0.4 mg total) under the tongue every 5 (five) minutes as needed for chest pain. 25 tablet 3   pantoprazole (PROTONIX) 40 MG tablet Take 1 tablet (40 mg total) by mouth daily. 90 tablet 3   rivaroxaban (XARELTO) 20 MG TABS tablet Take 1 tablet (20 mg total) by mouth daily with supper. 90 tablet 1   tamsulosin (FLOMAX) 0.4 MG CAPS capsule Take 0.4 mg by mouth daily.     zolpidem (AMBIEN) 5 MG tablet Take 5 mg by mouth at bedtime.  1   No current facility-administered medications on file prior to visit.    Allergies  Allergen Reactions   Celebrex [Celecoxib]     Upset stomach   Entresto [Sacubitril-Valsartan]     Low BP/difficulty talking/migraine    Assessment/Plan:  1. Hyperlipidemia - LDL increased to 125 off of atorvastatin and TG increased to 316 (also not fasting when labs were drawn). LDL goal < 70 due to CAD, TG goal < 150. Will stop atorvastatin secondary to joint pain and start rosuvastatin 20mg  daily. Will also start  Vascepa 2g BID for TG lowering and CV benefit - pt has commercial insurance so copay is affordable with copay card. Recheck fasting labs in 2 months. Can increase statin, add ezetimibe, or discuss PCSK9i therapy if additional LLT is needed.  Keara Pagliarulo E. Khloi Rawl, PharmD, BCACP, [place] [address], [place], [place] [address] Phone: [phone]; Fax: [phone] 07/11/2020 8:49 AM

## 2020-07-12 ENCOUNTER — Ambulatory Visit: Payer: Medicare Other

## 2020-09-09 ENCOUNTER — Other Ambulatory Visit: Payer: Medicare Other | Admitting: *Deleted

## 2020-09-09 ENCOUNTER — Other Ambulatory Visit: Payer: Self-pay

## 2020-09-09 DIAGNOSIS — E782 Mixed hyperlipidemia: Secondary | ICD-10-CM | POA: Diagnosis not present

## 2020-09-09 LAB — HEPATIC FUNCTION PANEL
ALT: 27 IU/L (ref 0–44)
AST: 23 IU/L (ref 0–40)
Albumin: 4.4 g/dL (ref 3.7–4.7)
Alkaline Phosphatase: 81 IU/L (ref 44–121)
Bilirubin Total: 0.5 mg/dL (ref 0.0–1.2)
Bilirubin, Direct: 0.15 mg/dL (ref 0.00–0.40)
Total Protein: 6.7 g/dL (ref 6.0–8.5)

## 2020-09-09 LAB — LIPID PANEL
Chol/HDL Ratio: 4.8 ratio (ref 0.0–5.0)
Cholesterol, Total: 133 mg/dL (ref 100–199)
HDL: 28 mg/dL — ABNORMAL LOW (ref >39)
LDL Chol Calc (NIH): 72 mg/dL (ref 0–99)
Triglycerides: 195 mg/dL — ABNORMAL HIGH (ref 0–149)
VLDL Cholesterol Cal: 33 mg/dL (ref 5–40)

## 2020-09-12 ENCOUNTER — Other Ambulatory Visit: Payer: Medicare Other

## 2020-10-18 DIAGNOSIS — I4891 Unspecified atrial fibrillation: Secondary | ICD-10-CM | POA: Diagnosis not present

## 2020-10-18 DIAGNOSIS — G4700 Insomnia, unspecified: Secondary | ICD-10-CM | POA: Diagnosis not present

## 2020-10-18 DIAGNOSIS — I25810 Atherosclerosis of coronary artery bypass graft(s) without angina pectoris: Secondary | ICD-10-CM | POA: Diagnosis not present

## 2020-10-18 DIAGNOSIS — E782 Mixed hyperlipidemia: Secondary | ICD-10-CM | POA: Diagnosis not present

## 2020-10-18 DIAGNOSIS — Z23 Encounter for immunization: Secondary | ICD-10-CM | POA: Diagnosis not present

## 2020-10-18 DIAGNOSIS — R7303 Prediabetes: Secondary | ICD-10-CM | POA: Diagnosis not present

## 2020-10-18 DIAGNOSIS — I712 Thoracic aortic aneurysm, without rupture, unspecified: Secondary | ICD-10-CM | POA: Diagnosis not present

## 2020-10-18 DIAGNOSIS — M109 Gout, unspecified: Secondary | ICD-10-CM | POA: Diagnosis not present

## 2020-10-18 DIAGNOSIS — I10 Essential (primary) hypertension: Secondary | ICD-10-CM | POA: Diagnosis not present

## 2020-10-18 DIAGNOSIS — D6869 Other thrombophilia: Secondary | ICD-10-CM | POA: Diagnosis not present

## 2020-11-13 ENCOUNTER — Other Ambulatory Visit: Payer: Self-pay | Admitting: Cardiology

## 2020-12-18 ENCOUNTER — Other Ambulatory Visit: Payer: Self-pay | Admitting: Cardiology

## 2020-12-19 NOTE — Telephone Encounter (Signed)
Xarelto 20 mg refill request received. Pt is 75 years old, weight- 87.4 kg, Crea- 1.36 on 06/15/20, last seen by [name] on 06/15/20, Diagnosis-afib, CrCl- 58.02; Dose is appropriate based on dosing criteria. Will send in refill to requested pharmacy.

## 2021-01-19 ENCOUNTER — Encounter: Payer: Self-pay | Admitting: Cardiology

## 2021-01-19 ENCOUNTER — Telehealth (INDEPENDENT_AMBULATORY_CARE_PROVIDER_SITE_OTHER): Payer: Medicare Other | Admitting: Cardiology

## 2021-01-19 ENCOUNTER — Other Ambulatory Visit: Payer: Self-pay

## 2021-01-19 ENCOUNTER — Encounter (HOSPITAL_BASED_OUTPATIENT_CLINIC_OR_DEPARTMENT_OTHER): Payer: Self-pay | Admitting: *Deleted

## 2021-01-19 ENCOUNTER — Emergency Department (HOSPITAL_BASED_OUTPATIENT_CLINIC_OR_DEPARTMENT_OTHER): Payer: Medicare Other

## 2021-01-19 ENCOUNTER — Observation Stay (HOSPITAL_BASED_OUTPATIENT_CLINIC_OR_DEPARTMENT_OTHER)
Admission: EM | Admit: 2021-01-19 | Discharge: 2021-01-21 | Disposition: A | Discharge status: Home / Self Care | Payer: Medicare Other | Attending: Family Medicine | Admitting: Family Medicine

## 2021-01-19 VITALS — Ht 69.0 in | Wt 193.0 lb

## 2021-01-19 DIAGNOSIS — I77810 Thoracic aortic ectasia: Secondary | ICD-10-CM

## 2021-01-19 DIAGNOSIS — I2510 Atherosclerotic heart disease of native coronary artery without angina pectoris: Secondary | ICD-10-CM

## 2021-01-19 DIAGNOSIS — I6782 Cerebral ischemia: Principal | ICD-10-CM | POA: Insufficient documentation

## 2021-01-19 DIAGNOSIS — Z7901 Long term (current) use of anticoagulants: Secondary | ICD-10-CM | POA: Insufficient documentation

## 2021-01-19 DIAGNOSIS — N1831 Chronic kidney disease, stage 3a: Secondary | ICD-10-CM | POA: Insufficient documentation

## 2021-01-19 DIAGNOSIS — I4819 Other persistent atrial fibrillation: Secondary | ICD-10-CM | POA: Diagnosis not present

## 2021-01-19 DIAGNOSIS — Z8673 Personal history of transient ischemic attack (TIA), and cerebral infarction without residual deficits: Secondary | ICD-10-CM | POA: Insufficient documentation

## 2021-01-19 DIAGNOSIS — E782 Mixed hyperlipidemia: Secondary | ICD-10-CM | POA: Diagnosis present

## 2021-01-19 DIAGNOSIS — Z20822 Contact with and (suspected) exposure to covid-19: Secondary | ICD-10-CM | POA: Diagnosis not present

## 2021-01-19 DIAGNOSIS — I130 Hypertensive heart and chronic kidney disease with heart failure and stage 1 through stage 4 chronic kidney disease, or unspecified chronic kidney disease: Secondary | ICD-10-CM | POA: Diagnosis not present

## 2021-01-19 DIAGNOSIS — I10 Essential (primary) hypertension: Secondary | ICD-10-CM | POA: Diagnosis present

## 2021-01-19 DIAGNOSIS — R4781 Slurred speech: Secondary | ICD-10-CM | POA: Diagnosis not present

## 2021-01-19 DIAGNOSIS — N183 Chronic kidney disease, stage 3 unspecified: Secondary | ICD-10-CM | POA: Diagnosis present

## 2021-01-19 DIAGNOSIS — I340 Nonrheumatic mitral (valve) insufficiency: Secondary | ICD-10-CM

## 2021-01-19 DIAGNOSIS — I482 Chronic atrial fibrillation, unspecified: Secondary | ICD-10-CM

## 2021-01-19 DIAGNOSIS — R2689 Other abnormalities of gait and mobility: Secondary | ICD-10-CM | POA: Insufficient documentation

## 2021-01-19 DIAGNOSIS — I5020 Unspecified systolic (congestive) heart failure: Secondary | ICD-10-CM | POA: Insufficient documentation

## 2021-01-19 DIAGNOSIS — I255 Ischemic cardiomyopathy: Secondary | ICD-10-CM | POA: Diagnosis not present

## 2021-01-19 DIAGNOSIS — G459 Transient cerebral ischemic attack, unspecified: Secondary | ICD-10-CM

## 2021-01-19 DIAGNOSIS — H539 Unspecified visual disturbance: Secondary | ICD-10-CM

## 2021-01-19 DIAGNOSIS — Z79899 Other long term (current) drug therapy: Secondary | ICD-10-CM | POA: Diagnosis not present

## 2021-01-19 DIAGNOSIS — Z951 Presence of aortocoronary bypass graft: Secondary | ICD-10-CM | POA: Insufficient documentation

## 2021-01-19 DIAGNOSIS — R42 Dizziness and giddiness: Secondary | ICD-10-CM | POA: Diagnosis not present

## 2021-01-19 LAB — URINALYSIS, MICROSCOPIC (REFLEX)

## 2021-01-19 LAB — BASIC METABOLIC PANEL
Anion gap: 9 (ref 5–15)
BUN: 24 mg/dL — ABNORMAL HIGH (ref 8–23)
CO2: 23 mmol/L (ref 22–32)
Calcium: 9 mg/dL (ref 8.9–10.3)
Chloride: 105 mmol/L (ref 98–111)
Creatinine, Ser: 1.41 mg/dL — ABNORMAL HIGH (ref 0.61–1.24)
GFR, Estimated: 52 mL/min — ABNORMAL LOW (ref >60)
Glucose, Bld: 107 mg/dL — ABNORMAL HIGH (ref 70–99)
Potassium: 4 mmol/L (ref 3.5–5.1)
Sodium: 137 mmol/L (ref 135–145)

## 2021-01-19 LAB — URINALYSIS, ROUTINE W REFLEX MICROSCOPIC
Bilirubin Urine: NEGATIVE
Glucose, UA: NEGATIVE mg/dL
Ketones, ur: NEGATIVE mg/dL
Leukocytes,Ua: NEGATIVE
Nitrite: NEGATIVE
Protein, ur: NEGATIVE mg/dL
Specific Gravity, Urine: 1.025 (ref 1.005–1.030)
pH: 5.5 (ref 5.0–8.0)

## 2021-01-19 LAB — CBC WITH DIFFERENTIAL/PLATELET
Abs Immature Granulocytes: 0.03 10*3/uL (ref 0.00–0.07)
Basophils Absolute: 0.1 10*3/uL (ref 0.0–0.1)
Basophils Relative: 1 %
Eosinophils Absolute: 0.2 10*3/uL (ref 0.0–0.5)
Eosinophils Relative: 2 %
HCT: 47.2 % (ref 39.0–52.0)
Hemoglobin: 16.2 g/dL (ref 13.0–17.0)
Immature Granulocytes: 0 %
Lymphocytes Relative: 21 %
Lymphs Abs: 1.6 10*3/uL (ref 0.7–4.0)
MCH: 32.3 pg (ref 26.0–34.0)
MCHC: 34.3 g/dL (ref 30.0–36.0)
MCV: 94.2 fL (ref 80.0–100.0)
Monocytes Absolute: 0.8 10*3/uL (ref 0.1–1.0)
Monocytes Relative: 10 %
Neutro Abs: 5.2 10*3/uL (ref 1.7–7.7)
Neutrophils Relative %: 66 %
Platelets: 155 10*3/uL (ref 150–400)
RBC: 5.01 MIL/uL (ref 4.22–5.81)
RDW: 13.8 % (ref 11.5–15.5)
WBC: 8 10*3/uL (ref 4.0–10.5)
nRBC: 0 % (ref 0.0–0.2)

## 2021-01-19 LAB — TROPONIN I (HIGH SENSITIVITY)
Troponin I (High Sensitivity): 4 ng/L (ref <18)
Troponin I (High Sensitivity): 4 ng/L (ref <18)

## 2021-01-19 LAB — RAPID URINE DRUG SCREEN, HOSP PERFORMED
Amphetamines: NOT DETECTED
Barbiturates: POSITIVE — AB
Benzodiazepines: NOT DETECTED
Cocaine: NOT DETECTED
Opiates: NOT DETECTED
Tetrahydrocannabinol: NOT DETECTED

## 2021-01-19 LAB — HEPATIC FUNCTION PANEL
ALT: 23 U/L (ref 0–44)
AST: 20 U/L (ref 15–41)
Albumin: 4.2 g/dL (ref 3.5–5.0)
Alkaline Phosphatase: 69 U/L (ref 38–126)
Bilirubin, Direct: <0.1 mg/dL (ref 0.0–0.2)
Total Bilirubin: 0.6 mg/dL (ref 0.3–1.2)
Total Protein: 7.3 g/dL (ref 6.5–8.1)

## 2021-01-19 LAB — RESP PANEL BY RT-PCR (FLU A&B, COVID) ARPGX2
Influenza A by PCR: NEGATIVE
Influenza B by PCR: NEGATIVE
SARS Coronavirus 2 by RT PCR: NEGATIVE

## 2021-01-19 LAB — ETHANOL: Alcohol, Ethyl (B): <10 mg/dL (ref <10)

## 2021-01-19 LAB — APTT: aPTT: 33 seconds (ref 24–36)

## 2021-01-19 LAB — PROTIME-INR
INR: 1.1 (ref 0.8–1.2)
Prothrombin Time: 14.6 seconds (ref 11.4–15.2)

## 2021-01-19 LAB — CBG MONITORING, ED: Glucose-Capillary: 87 mg/dL (ref 70–99)

## 2021-01-19 MED ORDER — PANTOPRAZOLE SODIUM 40 MG PO TBEC
40.0000 mg | DELAYED_RELEASE_TABLET | Freq: Every day | ORAL | Status: DC
  Administered 2021-01-21: 40 mg via ORAL
  Filled 2021-01-19: qty 1

## 2021-01-19 MED ORDER — RIVAROXABAN 20 MG PO TABS
20.0000 mg | ORAL_TABLET | Freq: Every day | ORAL | Status: DC
  Administered 2021-01-20: 20 mg via ORAL
  Filled 2021-01-19: qty 1

## 2021-01-19 MED ORDER — LOSARTAN POTASSIUM 25 MG PO TABS: 25.0000 mg | ORAL_TABLET | Freq: Every day | ORAL | Status: DC | PRN

## 2021-01-19 MED ORDER — LOSARTAN POTASSIUM 25 MG PO TABS
25.0000 mg | ORAL_TABLET | Freq: Every day | ORAL | Status: DC
  Administered 2021-01-19 – 2021-01-20 (×2): 25 mg via ORAL
  Filled 2021-01-19 (×2): qty 1

## 2021-01-19 MED ORDER — METOPROLOL TARTRATE 50 MG PO TABS
50.0000 mg | ORAL_TABLET | Freq: Two times a day (BID) | ORAL | Status: DC
Start: 2021-01-19 — End: 2021-01-21
  Administered 2021-01-19 – 2021-01-21 (×4): 50 mg via ORAL
  Filled 2021-01-19: qty 1
  Filled 2021-01-19 (×2): qty 2
  Filled 2021-01-19: qty 1

## 2021-01-19 NOTE — Progress Notes (Signed)
Cardiology Office Note:    Date:  01/19/2021   ID:  Sean Willis, DOB 24-Aug-1945, MRN [id number]  PCP:  Sean Low, MD  Cardiologist:  Sean Him, MD    Referring MD: Sean Low, MD   Chief Complaint  Patient presents with   Coronary Artery Disease   Hypertension   Hyperlipidemia   Atrial Fibrillation   Congestive Heart Failure   Cardiomyopathy     History of Present Illness:    Sean Willis is a 76 y.o. male with a hx of CAD status post CABG 4 2013 in setting of MI in [place], [place] with initial PTCA of the OM 2 and subsequent CABG with LIMA to the LAD, SVG to diagonal 1, SVG to OM1/R1 and SVG to distal RCA. Other issues include moderately dilated aortic root ([place]), severe OSA with inability to tolerate CPAP, HTN, HLD, chronic AF with prior cardioversion twice in 2018, on chronic anticoagulation with Xarelto. He has failed to maintain NSR and is managed with rate control and did not wish to be on AAD therapy.    Repeat cath 12/20 done for dyspnea on exertion and new LV dysfunction by echo showed bypass graft failure of the SVG to the RCA along with atresia of the LIMA to the LAD. There is also loss of the the end to side anastomosis of the sequential SVG to the OM.  Severe native LAD disease - felt to best manage medically in light of no ischemia on Myoview. Could consider PCI of the proximal LAD.   He has had multiple med changes - started Losartan, then Entresto and Aldactone. The Delene Loll was stopped due to dizziness and hypotension and was transitioned back to Losartan.     He is here today for followup and is doing well.  He tells me that he was in  gambling and had been standing up for a long period of time and went to sit down.  He then broke out in a sweat and his vision went blurry.  He thought maybe he was having an atypical migraine and went back to his room and took a Butalbital.  His speech got very garbled for a while but  everything resolved.  He has not called his Neurologist.  He says that since then he has had pain in both his arm mainly in his shoulders. It is mainly in his rotator cuff and more noticeable at night and is a 1/10.  He has been having some problems with heart burn and last week started some Nexium 1-2 times weekly and the heartburn went away.  He denies any SOB, DOE, PND, orthopnea, LE edema, palpitations or syncope.  He is compliant with this meds and is tolerating meds with no SE.     Past Medical History:  Diagnosis Date   Benign essential HTN 11/24/2013   BPH (benign prostatic hyperplasia)    patient unaware   CAD (coronary artery disease), native coronary artery 04/2011   S/P CABG X4 Sx done [place]   Carotid artery stenosis    1-39% bilateral by dopplers 2018   Chest wall pain    Right side, muscular,ortho,MRI. U/S abd ok. On lyrica   Chronic atrial fibrillation ([place])    s/p DCCV 01/2016, subsequent reversion, patient denied AAD   Chronic insomnia    CKD (chronic kidney disease), stage III (HCC)    Colon polyp 10/11   DDD (degenerative disc disease), lumbar    Dilatation of aorta ([place])  aortic root is mildly enlarged at 66mm and moderately enlarged ascending aorta at 82mm by echo 03/2020   ED (erectile dysfunction)    GERD (gastroesophageal reflux disease)    Gout    History of kidney stones    History of migraine    Ischemic cardiomyopathy    EF 35-40% with moderate RV dysfunction by echo 03/2020 and unchanged from echo 2018   Kidney stone    uric acid, Dr [name]  renal insufficiency   Mitral regurgitation 06/04/2016   Moderate by echo 2020   Mixed hyperlipidemia 01/23/2013   Myocardial infarction ([place])    OSA on CPAP 07/16/2016   Osteoarthritis    of the knee   TIA (transient ischemic attack)    questionable Mini stroke per patient    Past Surgical History:  Procedure Laterality Date   CARDIOVERSION [name] 01/20/2016   Procedure: CARDIOVERSION;  Surgeon: Thayer Headings, MD;  Location: [facility];  Service: Cardiovascular;  Laterality: [name];   CARDIOVERSION [name] 03/23/2016   Procedure: CARDIOVERSION;  Surgeon: Sueanne Margarita, MD;  Location: [facility];  Service: Cardiovascular;  Laterality: [name];   CARDIOVERSION     CATARACT EXTRACTION W/PHACO Right 10/26/2014   Procedure: CATARACT EXTRACTION PHACO AND INTRAOCULAR LENS PLACEMENT ([place]);  Surgeon: [name], MD;  Location: [facility];  Service: Ophthalmology;  Laterality: Right;  LOT PACK: [id number] H US:01:43.0 AP:27.8 CDE:28.68   CATARACT EXTRACTION W/PHACO Left 11/13/2016   Procedure: CATARACT EXTRACTION PHACO AND INTRAOCULAR LENS PLACEMENT ([facility]);  Surgeon: [name], MD;  Location: [facility];  Service: Ophthalmology;  Laterality: Left;  [place] 01:00.2 AP% 15.7 CDE 9.47 Fluid Pack lot # [id number]   COLONOSCOPY WITH PROPOFOL [name] 09/27/2015   Procedure: COLONOSCOPY WITH PROPOFOL;  Surgeon: [name], MD;  Location: [facility];  Service: Endoscopy;  Laterality: [name];   CORONARY ARTERY BYPASS GRAFT  04/2011   Severe multivessel ASCAD s/p Stemi w PTCA of OM2 then s/p CABG w LIMA to LAD,SVG to OM1 /RI and SVG to Distal RCA    CYSTOSCOPY/URETEROSCOPY/HOLMIUM LASER/STENT PLACEMENT Left 12/21/2019   Procedure: CYSTOSCOPY/RETROGREADE PYLEOGRAM/URETEROSCOPY/HOLMIUM LASER/STENT PLACEMENT;  Surgeon: Janith Lima, MD;  Location: [facility];  Service: Urology;  Laterality: Left;  ONLY NEEDS 60  MIN   FINGER SURGERY     left finger for ganglion cyst   KIDNEY STONE SURGERY     LEFT HEART CATH AND CORS/GRAFTS ANGIOGRAPHY [name] 12/02/2018   Procedure: LEFT HEART CATH AND CORS/GRAFTS ANGIOGRAPHY;  Surgeon: Belva Crome, MD;  Location: [facility];  Service: Cardiovascular;  Laterality: [name];   TONSILLECTOMY      Current Medications: Current Meds  Medication Sig   allopurinol (ZYLOPRIM) 100 MG tablet Take 100 mg by mouth daily.   Butalbital-APAP-Caffeine 50-325-40 MG capsule Take 1 capsule by  mouth as needed for migraine.   docusate sodium (COLACE) 100 MG capsule Take 1 capsule (100 mg total) by mouth daily as needed for up to 30 doses.   doxepin (SINEQUAN) 25 MG capsule Take 25 mg by mouth at bedtime.   escitalopram (LEXAPRO) 10 MG tablet Take 10 mg by mouth daily as needed (depression).   esomeprazole (NEXIUM) 20 MG packet Take 20 mg by mouth daily before breakfast.   isosorbide mononitrate (IMDUR) 30 MG 24 hr tablet Take 0.5 tablets (15 mg total) by mouth daily.   losartan (COZAAR) 25 MG tablet Take 1 tablet (25 mg total) by mouth daily as needed (high blood pressure).   metoprolol tartrate (LOPRESSOR) 50  MG tablet TAKE 1 TABLET BY MOUTH TWICE A DAY   mometasone (NASONEX) 50 MCG/ACT nasal spray Place 2 sprays into the nose daily as needed (congestion).   nitroGLYCERIN (NITROSTAT) 0.4 MG SL tablet Place 1 tablet (0.4 mg total) under the tongue every 5 (five) minutes as needed for chest pain.   rosuvastatin (CRESTOR) 20 MG tablet Take 1 tablet (20 mg total) by mouth daily.   tamsulosin (FLOMAX) 0.4 MG CAPS capsule Take 0.4 mg by mouth daily.   VASCEPA 1 g capsule Take 2 capsules (2 g total) by mouth 2 (two) times daily. (Patient taking differently: Take 1 g by mouth daily.)   XARELTO 20 MG TABS tablet TAKE 1 TABLET BY MOUTH DAILY WITH SUPPER.   zolpidem (AMBIEN) 5 MG tablet Take 5 mg by mouth at bedtime.     Allergies:   Atorvastatin, Celebrex [celecoxib], and Entresto [sacubitril-valsartan]   Social History   Socioeconomic History   Marital status: Widowed    Spouse name: Not on file   Number of children: Not on file   Years of education: Not on file   Highest education level: Not on file  Occupational History   Not on file  Tobacco Use   Smoking status: Never   Smokeless tobacco: Never  Vaping Use   Vaping Use: Never used  Substance and Sexual Activity   Alcohol use: Yes    Comment: yes occassionally   Drug use: No   Sexual activity: Not on file  Other Topics  Concern   Not on file  Social History Narrative   Not on file   Social Determinants of Health   Financial Resource Strain: Not on file  Food Insecurity: Not on file  Transportation Needs: Not on file  Physical Activity: Not on file  Stress: Not on file  Social Connections: Not on file     Family History: The patient's family history includes Alzheimer's disease in his mother; Arrhythmia in his father and sister; CVA in his father; Cancer in his mother.  ROS:   Please see the history of present illness.    ROS  All other systems reviewed and negative.   EKGs/Labs/Other Studies Reviewed:    The following studies were reviewed today: none  EKG:  EKG is not ordered today.    Recent Labs: 06/15/2020: BUN 17; Creatinine, Ser 1.36; Potassium 4.6; Sodium 139 09/09/2020: ALT 27   Recent Lipid Panel    Component Value Date/Time   CHOL 133 09/09/2020 0832   CHOL 123 01/14/2013 0923   TRIG 195 (H) 09/09/2020 0832   TRIG 141 01/14/2013 0923   HDL 28 (L) 09/09/2020 0832   HDL 34 (L) 01/14/2013 0923   CHOLHDL 4.8 09/09/2020 0832   CHOLHDL 5.8 (H) 12/20/2015 0914   VLDL 38 (H) 12/20/2015 0914   LDLCALC 72 09/09/2020 0832   LDLCALC 61 01/14/2013 0923    Physical Exam:    VS:  Ht 5\' 9"  (1.753 m)    Wt 193 lb (87.5 kg)    BMI 28.50 kg/m     Wt Readings from Last 3 Encounters:  01/19/21 193 lb (87.5 kg)  06/15/20 192 lb 9.6 oz (87.4 kg)  03/16/20 196 lb (88.9 kg)    Well nourished, well developed male in no acute distress. Well appearing, alert and conversant, regular work of breathing,  good skin color  Eyes- anicteric mouth- oral mucosa is pink  neuro- grossly intact skin- no apparent rash or lesions or cyanosis  ASSESSMENT:  1. Coronary artery disease involving native coronary artery of native heart without angina pectoris   2. Benign essential HTN   3. Ischemic cardiomyopathy   4. Mixed hyperlipidemia   5. Chronic atrial fibrillation (HCC)   6. Aortic root  dilatation (HCC)   7. Nonrheumatic mitral valve regurgitation    PLAN:    In order of problems listed above:  1.  ASCAD -status post CABG 4 2013 in setting of MI in [place], [place] with initial PTCA of the OM 2 and subsequent CABG with LIMA to the LAD, SVG to diagonal 1, SVG to OM1/R1 and SVG to distal RCA. -repeat cath 12/2018 showed bypass graft failure of the SVG to the RCA along with atresia of the LIMA to the LAD. There is also loss of the the end to side anastomosis of the sequential SVG to the OM.  Severe native LAD disease  -felt to best manage medically in light of no ischemia on Myoview.  -no ischemia on nuclear stress test 11/2018 -He has not had any anginal symptoms recently but has been having his bilateral shoulder pain that seems to worse at night when he is quiet and has been constant.  -He tells me that when he had angina in the past he had severe weakness in his arms but no pain and he has not weakness currently>>I do not think his sx are related to his heart.  The shoulder pain improves with biofreeze. -Continue prescription drug management with Imdur 30 mg daily, Lopressor 50 mg twice daily and atorvastatin 80 mg daily with as needed refills -no ASA due to DOAC   2.  HTN -His BP is well controlled on exam today. -Continue prescription drug management Lopressor 50 mg twice daily and losartan 25 mg daily with as needed refills   3.  IDCM -2D echo 3/22  showed moderate LV and RV dysfunction with EF 35-40% with severe LAE, mild MR  -repeat cath 12/2018 showed bypass graft failure of the SVG to the RCA along with atresia of the LIMA to the LAD. There is also loss of the the end to side anastomosis of the sequential SVG to the OM.  Severe native LAD disease>> medical management recommended -no indication for AICD -BP did not tolerate Entresto -His weight remains stable and he has not had any shortness of breath or lower extremity edema -Continue prescription drug  management with losartan 25 mg daily Lopressor 50 mg twice daily with as needed refills -Check bmet   4.  HLD -LDL goal < 70 -I have personally reviewed and interpreted outside labs performed by patient's PCP which showed LDL 72, HDL 28, triglycerides 195 on 05/07/2018 -Repeat FLP and ALT -Continue prescription drug management with atorvastatin 80 mg daily with as needed refills   5.  Chronic atrial fibrillation -Heart rate is controlled -He denies any palpitations recently -No bleeding problems on DOAC -We will check bmet, CBC  -Continue prescription drug management with Xarelto 20 mg daily and Lopressor 50 mg twice daily with as needed refills  6.  Dilated aorta -moderately dilated at 67mm -followed by Dr. [name] -continue statin -BP controlled  7.  Mitral regurgitation -moderate on echo 12/2018 but mild on repeat echo 03/2020  8.  Dizziness -he had been standing up for 30 min at a gambling table when he became very dizzy with slurred speech and got nauseated.  He went to sit down and then broke out in a sweat.  His sister said that his  speech was very garbled.  He went back t his room and it seemed to subside -? Whether he had a vagal event or orthostatic hypotension>>he is in the car coming back from [facility] and feels fine right now but shoulders still hurt -he has a hx of TIAs and with the garbled speech I have recommended that he go to the ER when he gets home for evaluation.  He will go to [facility] for evaluatoin.    Followup with me in 6 months   Medication Adjustments/Labs and Tests Ordered: Current medicines are reviewed at length with the patient today.  Concerns regarding medicines are outlined above.  No orders of the defined types were placed in this encounter.   No orders of the defined types were placed in this encounter.    Signed, Sean Him, MD  01/19/2021 1:58 PM    [place]

## 2021-01-19 NOTE — Discharge Instructions (Addendum)
Please return immediately should you decide you wish to be admitted for the remainder of your evaluation.  Know that leaving before a work up is complete means that we are unable to vouch for your safety. The risk of leaving includes the potential for completed stroke, severe morbidity, up to and including the risk of death. We welcome you to return at any time.  Get help right away if: You have chest pain or an irregular heartbeat. You have any symptoms of a stroke. "BE FAST" is an easy way to remember the main warning signs of a stroke. B - Balance. Signs are dizziness, sudden trouble walking, or loss of balance. E - Eyes. Signs are trouble seeing or a sudden change in vision. F - Face. Signs are sudden weakness or numbness of the face, or the face or eyelid drooping on one side. A - Arms. Signs are weakness or numbness in an arm. This happens suddenly and usually on one side of the body. S - Speech. Signs are sudden trouble speaking, slurred speech, or trouble understanding what people say. T - Time. Time to call emergency services. Write down what time symptoms started. You have other signs of a stroke, such as: A sudden, severe headache with no known cause. Nausea or vomiting. Seizure.

## 2021-01-19 NOTE — Patient Instructions (Signed)
Medication Instructions:  Your physician recommends that you continue on your current medications as directed. Please refer to the Current Medication list given to you today.  *If you need a refill on your cardiac medications before your next appointment, please call your pharmacy*  Lab Work: Fasting lipids, CMET, and CBC If you have labs (blood work) drawn today and your tests are completely normal, you will receive your results only by: [place] (if you have MyChart) OR A paper copy in the mail If you have any lab test that is abnormal or we need to change your treatment, we will call you to review the results.  Follow-Up: At [facility], you and your health needs are our priority.  As part of our continuing mission to provide you with exceptional heart care, we have created designated Provider Care Teams.  These Care Teams include your primary Cardiologist (physician) and Advanced Practice Providers (APPs -  Physician Assistants and Nurse Practitioners) who all work together to provide you with the care you need, when you need it.  Your next appointment:   6 month(s)  The format for your next appointment:   In Person  Provider:   Fransico Him, MD

## 2021-01-19 NOTE — Addendum Note (Signed)
Addended by: Antonieta Iba on: 01/19/2021 02:08 PM   Modules accepted: Orders

## 2021-01-19 NOTE — Plan of Care (Signed)
[facility] will assume care on arrival to accepting facility. Until arrival, care as per EDP. However, [facility] available 24/7 for questions and assistance. ° °Nursing staff, please page [facility] Admits and Consults ([phone]) as soon as the patient arrives the hospital.   °

## 2021-01-19 NOTE — ED Triage Notes (Addendum)
Yesterday am he became lightheaded, broke into a sweat and had blurred vision. He sat for 15 minutes then walked to the bedroom to lay down. States he had the same thing happen to him 15 months ago when he was diagnosed with a complicated migraine.  Today he feels fine.

## 2021-01-19 NOTE — ED Provider Notes (Addendum)
[facility] Provider Note   CSN: [id number] Arrival date & time: 01/19/21  1538     History  CAD status post bypass graft, history of CVA, history of A. fib on Xarelto No chief complaint on file.   Sean Willis is a 76 y.o. male who presents for neurologic abnormality yesterday.  [place] his [place].  He states he had sudden onset of lightheadedness, diaphoresis, nausea, dysarthria versus aphasia, and blurry vision lasting approximately 20 minutes.  Patient states that he had a similar episode of this back in late 2020 and was hospitalized and had a work-up and was told that he had a complicated migraine.  Review of EMR shows that the patient did in fact have 2 incidental infarcts noted that were suspected to be status post heart cath procedure.  Patient states that symptoms resolved and he was able to get back up to his room.  He took a butalbital that was prescribed to him previously when he had his "complicated migraine."  And he has been symptom-free since that time.  He called his PCP today who sent him in for further evaluation.  He denies chest pain, shortness of breath, fevers, chills, unilateral weakness.  HPI     Home Medications Prior to Admission medications   Medication Sig Start Date End Date Taking? Authorizing Provider  allopurinol (ZYLOPRIM) 100 MG tablet Take 100 mg by mouth daily.    [provider]  Butalbital-APAP-Caffeine [id number] MG capsule Take 1 capsule by mouth as needed for migraine. 10/30/19   [provider]  docusate sodium (COLACE) 100 MG capsule Take 1 capsule (100 mg total) by mouth daily as needed for up to 30 doses. 12/21/19   Janith Lima, MD  doxepin (SINEQUAN) 25 MG capsule Take 25 mg by mouth at bedtime.    [provider]  escitalopram (LEXAPRO) 10 MG tablet Take 10 mg by mouth daily as needed (depression). 11/17/19   [provider]   esomeprazole (NEXIUM) 20 MG packet Take 20 mg by mouth daily before breakfast.    [provider]  isosorbide mononitrate (IMDUR) 30 MG 24 hr tablet Take 0.5 tablets (15 mg total) by mouth daily. 06/15/20   Sueanne Margarita, MD  losartan (COZAAR) 25 MG tablet Take 1 tablet (25 mg total) by mouth daily as needed (high blood pressure). 06/15/20   Sueanne Margarita, MD  metoprolol tartrate (LOPRESSOR) 50 MG tablet TAKE 1 TABLET BY MOUTH TWICE A DAY 11/14/20   Turner, Eber Hong, MD  mometasone (NASONEX) 50 MCG/ACT nasal spray Place 2 sprays into the nose daily as needed (congestion).    [provider]  nitroGLYCERIN (NITROSTAT) 0.4 MG SL tablet Place 1 tablet (0.4 mg total) under the tongue every 5 (five) minutes as needed for chest pain. 12/23/18 01/19/21  Burtis Junes, NP  rosuvastatin (CRESTOR) 20 MG tablet Take 1 tablet (20 mg total) by mouth daily. 07/11/20   Sueanne Margarita, MD  tamsulosin (FLOMAX) 0.4 MG CAPS capsule Take 0.4 mg by mouth daily. 09/14/19   [provider]  VASCEPA 1 g capsule Take 2 capsules (2 g total) by mouth 2 (two) times daily. Patient taking differently: Take 1 g by mouth daily. 07/11/20   Turner, Eber Hong, MD  XARELTO 20 MG TABS tablet TAKE 1 TABLET BY MOUTH DAILY WITH SUPPER. 12/19/20   Turner, Eber Hong, MD  zolpidem (AMBIEN) 5 MG tablet Take 5 mg  by mouth at bedtime. 10/19/16   [provider]      Allergies    Atorvastatin, Celebrex [celecoxib], and Entresto [sacubitril-valsartan]    Review of Systems   Review of Systems As per HPI Physical Exam Updated Vital Signs BP (!) 143/109    Pulse 72    Temp 97.6 F (36.4 C) (Oral)    Resp 16    Ht 5\' 9"  (1.753 m)    Wt 87.5 kg    SpO2 97%    BMI 28.50 kg/m  Physical Exam Vitals and nursing note reviewed.  Constitutional:      General: He is not in acute distress.    Appearance: He is well-developed. He is not diaphoretic.  HENT:     Head: Normocephalic and atraumatic.  Eyes:      General: No scleral icterus.    Conjunctiva/sclera: Conjunctivae normal.  Cardiovascular:     Rate and Rhythm: Normal rate and regular rhythm.     Heart sounds: Normal heart sounds.  Pulmonary:     Effort: Pulmonary effort is normal. No respiratory distress.     Breath sounds: Normal breath sounds.  Abdominal:     Palpations: Abdomen is soft.     Tenderness: There is no abdominal tenderness.  Musculoskeletal:     Cervical back: Normal range of motion and neck supple.  Skin:    General: Skin is warm and dry.  Neurological:     General: No focal deficit present.     Mental Status: He is alert.     Cranial Nerves: No cranial nerve deficit.     Sensory: No sensory deficit.     Motor: No weakness.     Coordination: Coordination normal.     Gait: Gait normal.     Deep Tendon Reflexes: Reflexes normal.     Comments: Speech is clear and goal oriented, follows commands Major Cranial nerves without deficit, no facial droop Normal strength in upper and lower extremities bilaterally including dorsiflexion and plantar flexion, strong and equal grip strength Sensation normal to light and sharp touch Moves extremities without ataxia, coordination intact Normal finger to nose and rapid alternating movements Neg romberg, no pronator drift Normal gait Normal heel-shin and balance   Psychiatric:        Behavior: Behavior normal.    ED Results / Procedures / Treatments   Labs (all labs ordered are listed, but only abnormal results are displayed) Labs Reviewed  BASIC METABOLIC PANEL - Abnormal; Notable for the following components:      Result Value   Glucose, Bld 107 (*)    BUN 24 (*)    Creatinine, Ser 1.41 (*)    GFR, Estimated 52 (*)    All other components within normal limits  RESP PANEL BY RT-PCR (FLU A&B, COVID) ARPGX2  CBC WITH DIFFERENTIAL/PLATELET  ETHANOL  PROTIME-INR  APTT  RAPID URINE DRUG SCREEN, HOSP PERFORMED  URINALYSIS, ROUTINE W REFLEX MICROSCOPIC  HEPATIC  FUNCTION PANEL  CBG MONITORING, ED  TROPONIN I (HIGH SENSITIVITY)    EKG EKG Interpretation  Date/Time:  Thursday January 19 2021 15:53:16 EST Ventricular Rate:  91 PR Interval:  142 QRS Duration: 103 QT Interval:  376 QTC Calculation: 463 R Axis:   32 Text Interpretation: Atrial fibrillation similar to 2021 Confirmed by Sherwood Gambler [phone]) on 01/19/2021 4:12:07 PM  Radiology No results found.  Procedures Procedures    Medications Ordered in ED Medications - No data to display  ED Course/ Medical Decision Making/  A&P Clinical Course as of 01/20/21 1336  Thu Jan 19, 2021  1735 CBC WITH DIFFERENTIAL WNL [AH]  9563 Basic metabolic panel(!) Mild renal insufficiency appears to be at baseline [AH]  1736 Troponin I (High Sensitivity) [AH]  1907 Permissive hypertension, no antiplatelts Per Coleen stack [AH]  1953 Resp Panel by RT-PCR (Flu A&B, Covid) Nasopharyngeal Swab [AH]  1953 Urine rapid drug screen (hosp performed)(!) [AH]  1953 Urinalysis, Routine w reflex microscopic Nasopharyngeal Swab(!) [AH]  1953 Troponin I (High Sensitivity) [AH]  1953 Ethanol Results wnl, except Pos uds for barbiturates- took Fiorcet. [AH]    Clinical Course User Index [AH] Margarita Mail, PA-C                           Medical Decision Making Amount and/or Complexity of Data Reviewed Labs: ordered. Decision-making details documented in ED Course. Radiology: ordered.  Risk Prescription drug management. Decision regarding hospitalization.   This patient presents to the ED for concern of transiant speech problem, blurry vision light headedness, this involves an extensive number of treatment options, and is a complaint that carries with it a high risk of complications and morbidity.  The differential diagnosis includes Stroke, TIA, encephalopathy, cardiac event, recrudescence.   Co morbidities that complicate the patient evaluation  CAD, Previous CVA, permanent AFIB on Xarelto     Additional history obtained:  Additional history obtained from sister at bedside    Lab Tests:  I Ordered, and personally interpreted labs.  The pertinent results include:  as detailed in ed course   Imaging Studies ordered:  I ordered imaging studies including CT head I independently visualized and interpreted imaging which showed No acute findings I agree with the radiologist interpretation   Cardiac Monitoring:  The patient was maintained on a cardiac monitor.  I personally viewed and interpreted the cardiac monitored which showed an underlying rhythm of: Rate controlled Afib   Medicines ordered and prescription drug management:    Test Considered:  CTA/ MRI- further eval for Stroke, Last cta showed no evident cerebrovascular disease     Consultations Obtained:  I requested consultation with the Dr. Quinn Axe of Neurology,  and discussed lab and imaging findings as well as pertinent plan - they recommend: admission for TIA work up   Problem List / ED Course:  TIA   Reevaluation:     Social Determinants of Health:  Close OP fu/ health insurance   Dispostion:  After consideration of the diagnostic results and the patients response to treatment, I feel that the patent would benefit from Inpatient admission. Case discussed with Dr. Marlowe Sax who admitted the pateint.      Final Clinical Impression(s) / ED Diagnoses Final diagnoses:  None    Rx / DC Orders ED Discharge Orders     None         Margarita Mail, PA-C 01/20/21 [place], PA-C 01/20/21 1347    Sherwood Gambler, MD 01/23/21 [phone]

## 2021-01-20 ENCOUNTER — Observation Stay (HOSPITAL_BASED_OUTPATIENT_CLINIC_OR_DEPARTMENT_OTHER): Payer: Medicare Other

## 2021-01-20 ENCOUNTER — Encounter (HOSPITAL_COMMUNITY): Payer: Self-pay | Admitting: Internal Medicine

## 2021-01-20 ENCOUNTER — Observation Stay (HOSPITAL_COMMUNITY): Payer: Medicare Other

## 2021-01-20 DIAGNOSIS — I4819 Other persistent atrial fibrillation: Secondary | ICD-10-CM | POA: Diagnosis not present

## 2021-01-20 DIAGNOSIS — G319 Degenerative disease of nervous system, unspecified: Secondary | ICD-10-CM | POA: Diagnosis not present

## 2021-01-20 DIAGNOSIS — I5020 Unspecified systolic (congestive) heart failure: Secondary | ICD-10-CM | POA: Diagnosis not present

## 2021-01-20 DIAGNOSIS — R479 Unspecified speech disturbances: Secondary | ICD-10-CM

## 2021-01-20 DIAGNOSIS — H539 Unspecified visual disturbance: Secondary | ICD-10-CM | POA: Diagnosis not present

## 2021-01-20 DIAGNOSIS — I10 Essential (primary) hypertension: Secondary | ICD-10-CM

## 2021-01-20 DIAGNOSIS — E782 Mixed hyperlipidemia: Secondary | ICD-10-CM

## 2021-01-20 DIAGNOSIS — Z20822 Contact with and (suspected) exposure to covid-19: Secondary | ICD-10-CM | POA: Diagnosis not present

## 2021-01-20 DIAGNOSIS — I2510 Atherosclerotic heart disease of native coronary artery without angina pectoris: Secondary | ICD-10-CM | POA: Diagnosis not present

## 2021-01-20 DIAGNOSIS — R4781 Slurred speech: Secondary | ICD-10-CM | POA: Diagnosis present

## 2021-01-20 DIAGNOSIS — N183 Chronic kidney disease, stage 3 unspecified: Secondary | ICD-10-CM | POA: Diagnosis present

## 2021-01-20 DIAGNOSIS — I6782 Cerebral ischemia: Secondary | ICD-10-CM | POA: Diagnosis not present

## 2021-01-20 DIAGNOSIS — N1831 Chronic kidney disease, stage 3a: Secondary | ICD-10-CM

## 2021-01-20 DIAGNOSIS — Z79899 Other long term (current) drug therapy: Secondary | ICD-10-CM | POA: Diagnosis not present

## 2021-01-20 DIAGNOSIS — Z8673 Personal history of transient ischemic attack (TIA), and cerebral infarction without residual deficits: Secondary | ICD-10-CM | POA: Diagnosis not present

## 2021-01-20 DIAGNOSIS — I130 Hypertensive heart and chronic kidney disease with heart failure and stage 1 through stage 4 chronic kidney disease, or unspecified chronic kidney disease: Secondary | ICD-10-CM | POA: Diagnosis not present

## 2021-01-20 DIAGNOSIS — Z951 Presence of aortocoronary bypass graft: Secondary | ICD-10-CM | POA: Diagnosis not present

## 2021-01-20 DIAGNOSIS — Z7901 Long term (current) use of anticoagulants: Secondary | ICD-10-CM | POA: Diagnosis not present

## 2021-01-20 LAB — ECHOCARDIOGRAM COMPLETE
Height: 69 in
S' Lateral: 4.1 cm
Weight: 3088 oz

## 2021-01-20 IMAGING — MR MR HEAD W/O CM
8 of 10 series · 34 of 48 positions shown · non-contrast
Comparison: Prior head CT examinations [DATE]. Brain MRI
[DATE]. CT angiogram head/neck [DATE].

CLINICAL DATA: Provided history: Stroke, follow-up. Additional
history provided: Episode of sudden onset lightheadedness,
diaphoresis, nausea, dysarthria versus aphasia, blurry vision.

EXAM:
MRI HEAD WITHOUT CONTRAST
TECHNIQUE: Multiplanar, multiecho pulse sequences of the brain and surrounding
structures were obtained without intravenous contrast.

[Series 3: DWI · axial · 3.0mm · 1.09mm/px · z∈[-109,+47]mm · 8 of 108 slices shown (1 of 4)]
[im 1/108]
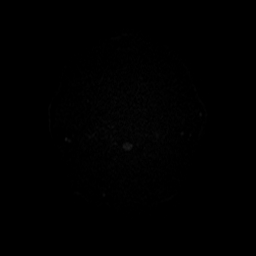
[im 12/108]
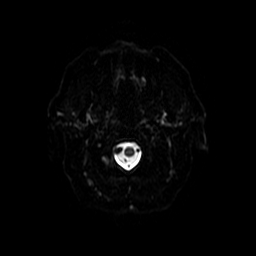
[im 36/108]
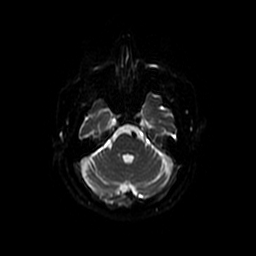
[im 48/108]
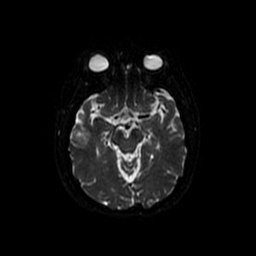
[im 60/108]
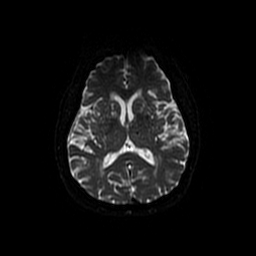
[im 72/108]
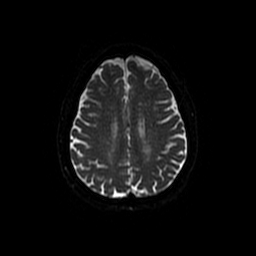
[im 96/108]
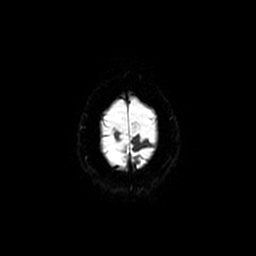
[im 108/108]
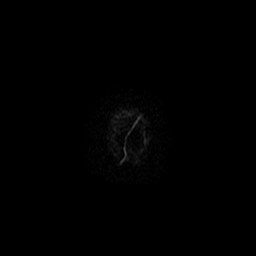

[Series 4: DWI · coronal · 5.0mm · 1.09mm/px · 7 of 74 slices shown (2 of 4)]
[im 1/74]
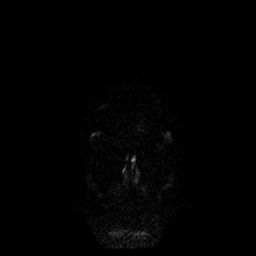
[im 13/74]
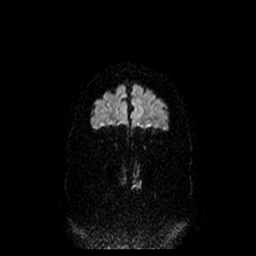
[im 25/74]
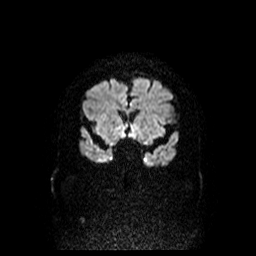
[im 37/74]
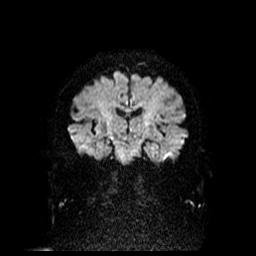
[im 49/74]
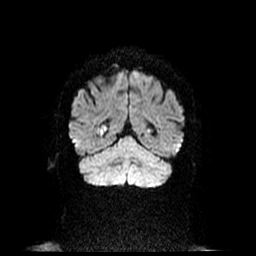
[im 61/74]
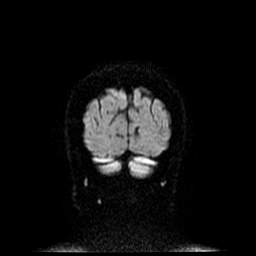
[im 74/74]
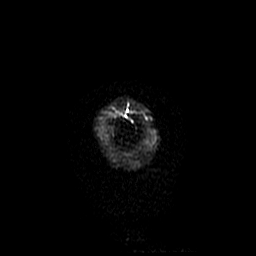

[Series 5: T1 · sagittal · 5.0mm · 0.47mm/px · 2 of 25 slices shown]
[im 1/25]
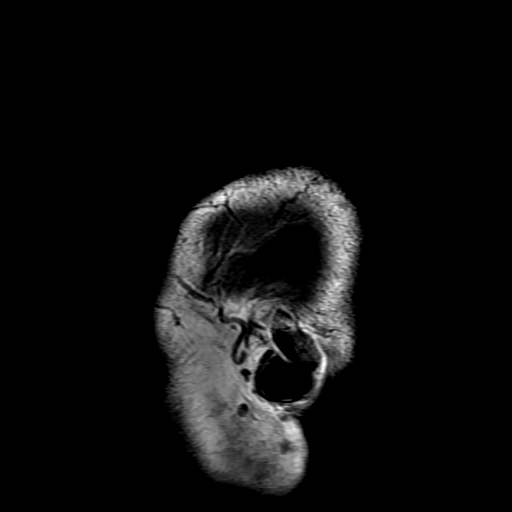
[im 25/25]
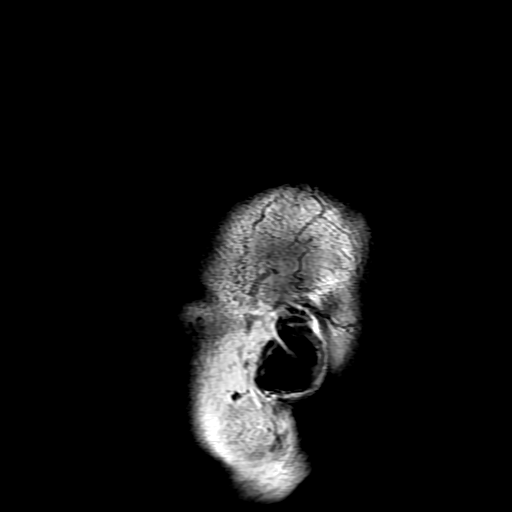

[Series 6: T2 · axial · 5.0mm · 0.43mm/px · z∈[-102,+51]mm · 3 of 27 slices shown (1 of 2)]
[im 1/27]
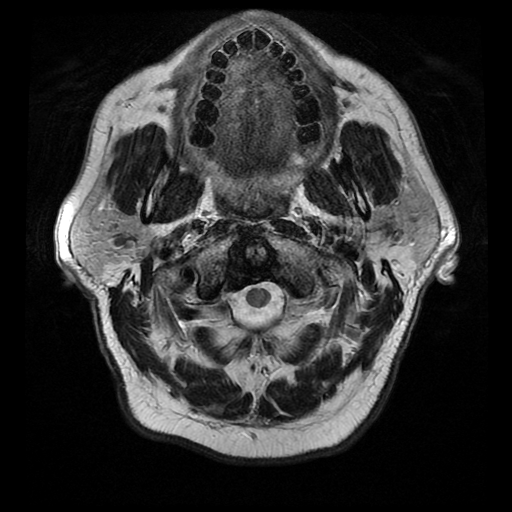
[im 14/27]
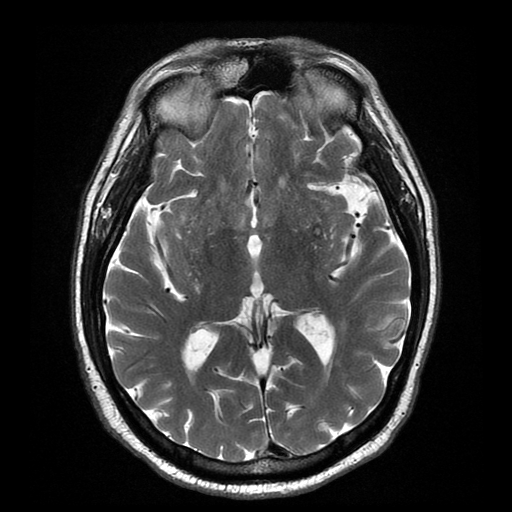
[im 27/27]
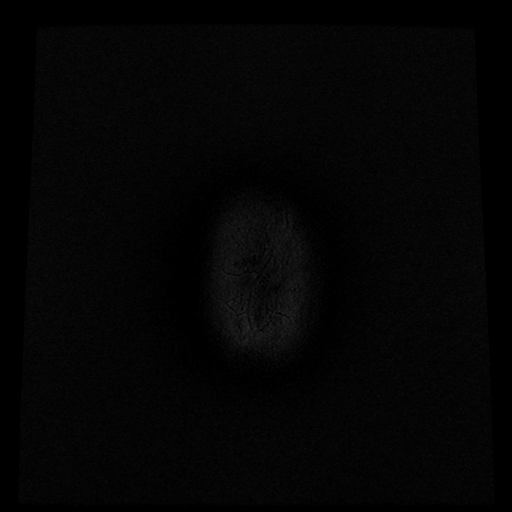

[Series 7: FLAIR · axial · 3.0mm · 0.43mm/px · z∈[-102,+51]mm · 3 of 27 slices shown]
[im 1/27]
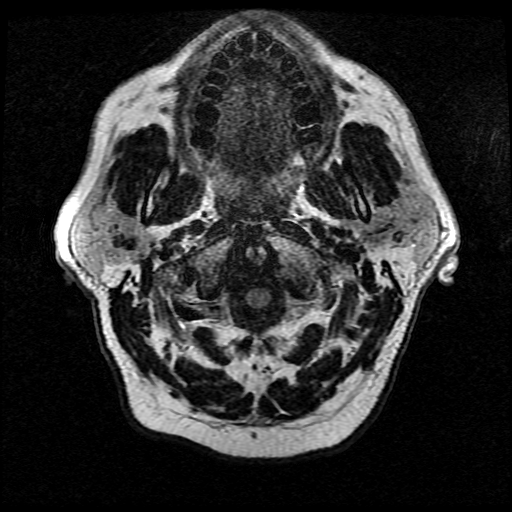
[im 14/27]
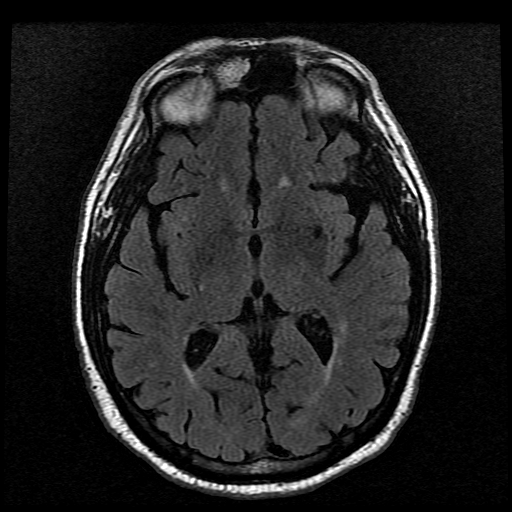
[im 27/27]
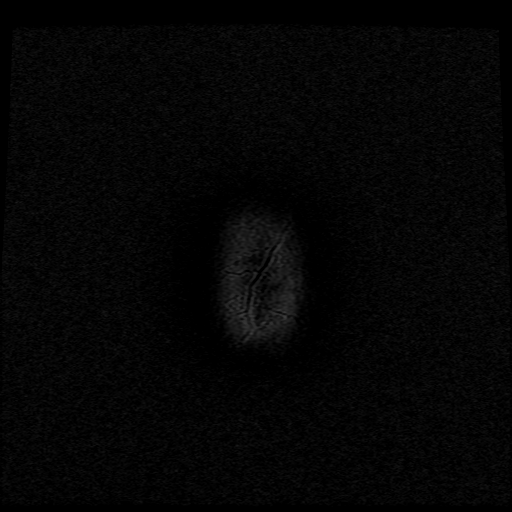

[Series 10: T2 · coronal · 5.0mm · 0.39mm/px · 3 of 28 slices shown (2 of 2)]
[im 1/28]
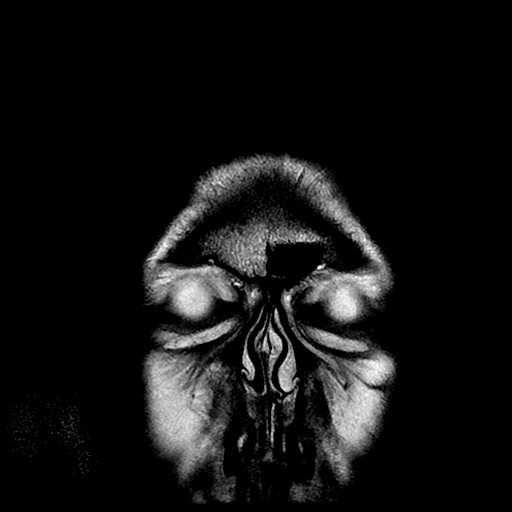
[im 14/28]
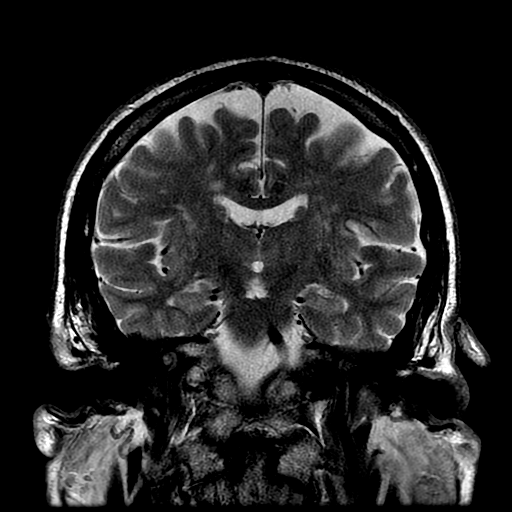
[im 28/28]
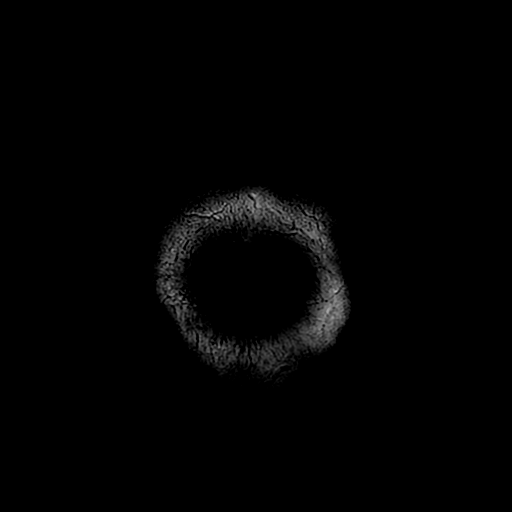

[Series 300: DWI · axial · 3.0mm · 1.09mm/px · z∈[-109,+47]mm · 5 of 54 slices shown (3 of 4)]
[im 1/54]
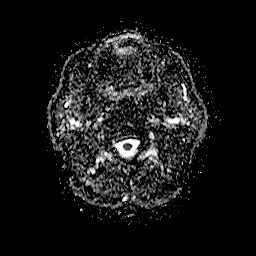
[im 14/54]
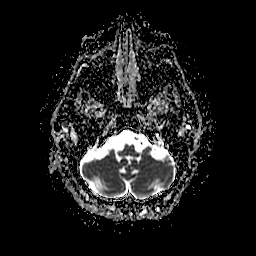
[im 27/54]
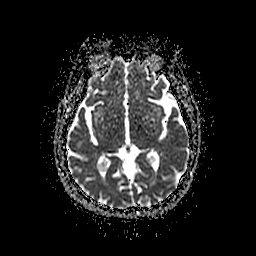
[im 40/54]
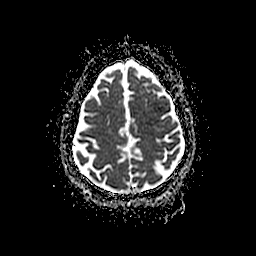
[im 54/54]
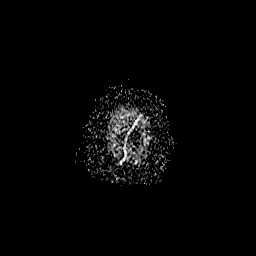

[Series 400: DWI · coronal · 5.0mm · 1.09mm/px · 3 of 37 slices shown (4 of 4)]
[im 1/37]
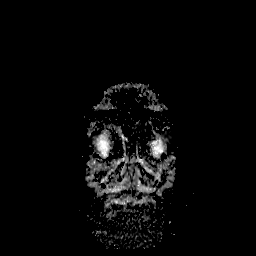
[im 19/37]
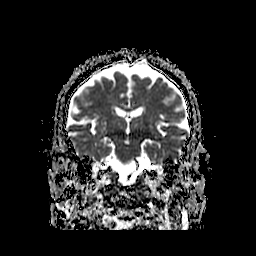
[im 37/37]
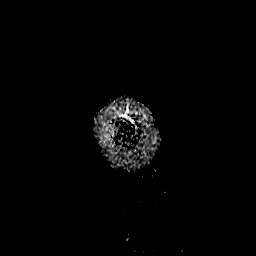

[34 of 48 positions shown; findings below may reference images not displayed]

FINDINGS: Brain:

Mild generalized cerebral atrophy.

Moderate multifocal T2 FLAIR hyperintense signal abnormality within
the cerebral white matter, nonspecific but compatible with chronic
small vessel ischemic disease.

As before, there are multiple small T2 hyperintense foci within the
bilateral basal ganglia/internal capsules, likely reflecting a
combination of prominent perivascular spaces and chronic lacunar
infarcts.

Redemonstrated chronic microhemorrhages within the left basal
ganglia and thalamus, as well as right midbrain.

There is no acute infarct.

No evidence of an intracranial mass.

No extra-axial fluid collection.

No midline shift.

Vascular: Maintained flow voids within the proximal large arterial
vessels.

Skull and upper cervical spine: No focal suspicious marrow lesion.

Sinuses/Orbits: Visualized orbits show no acute finding. Trace
mucosal thickening within the bilateral ethmoid, right sphenoid and
bilateral maxillary sinuses.
IMPRESSION: No evidence of acute intracranial abnormality.

Moderate chronic small vessel ischemic disease within the cerebral
white matter, not significantly changed from the prior brain MRI of
[DATE].

As before, there are multiple small T2 hyperintense foci within the
bilateral basal ganglia/internal capsules, likely reflecting a
combination of prominent perivascular spaces and chronic lacunar
infarcts.

Mild generalized cerebral atrophy.

## 2021-01-20 MED ORDER — DOCUSATE SODIUM 100 MG PO CAPS: 100.0000 mg | ORAL_CAPSULE | Freq: Every day | ORAL | Status: DC | PRN

## 2021-01-20 MED ORDER — ISOSORBIDE MONONITRATE ER 30 MG PO TB24
15.0000 mg | ORAL_TABLET | Freq: Every day | ORAL | Status: DC
Start: 2021-01-21 — End: 2021-01-21
  Administered 2021-01-21: 15 mg via ORAL
  Filled 2021-01-20: qty 1

## 2021-01-20 MED ORDER — DOXEPIN HCL 25 MG PO CAPS
25.0000 mg | ORAL_CAPSULE | Freq: Every day | ORAL | Status: DC
  Administered 2021-01-20: 25 mg via ORAL
  Filled 2021-01-20 (×2): qty 1

## 2021-01-20 MED ORDER — ALPRAZOLAM 0.5 MG PO TABS
0.5000 mg | ORAL_TABLET | Freq: Every evening | ORAL | Status: DC | PRN
  Administered 2021-01-20: 0.5 mg via ORAL
  Filled 2021-01-20: qty 1

## 2021-01-20 MED ORDER — ZOLPIDEM TARTRATE 5 MG PO TABS: 5.0000 mg | ORAL_TABLET | Freq: Every day | ORAL | Status: DC

## 2021-01-20 MED ORDER — ACETAMINOPHEN 325 MG PO TABS
650.0000 mg | ORAL_TABLET | Freq: Once | ORAL | Status: AC
  Administered 2021-01-20: 650 mg via ORAL
  Filled 2021-01-20: qty 2

## 2021-01-20 MED ORDER — BUTALBITAL-APAP-CAFFEINE 50-325-40 MG PO TABS: 1.0000 | ORAL_TABLET | Freq: Four times a day (QID) | ORAL | Status: DC | PRN

## 2021-01-20 MED ORDER — FLUTICASONE PROPIONATE 50 MCG/ACT NA SUSP
2.0000 | Freq: Every day | NASAL | Status: DC | PRN
  Filled 2021-01-20: qty 16

## 2021-01-20 MED ORDER — ACETAMINOPHEN 650 MG RE SUPP: 650.0000 mg | RECTAL | Status: DC | PRN

## 2021-01-20 MED ORDER — ACETAMINOPHEN 325 MG PO TABS
650.0000 mg | ORAL_TABLET | ORAL | Status: DC | PRN
  Administered 2021-01-20: 650 mg via ORAL
  Filled 2021-01-20: qty 2

## 2021-01-20 MED ORDER — TAMSULOSIN HCL 0.4 MG PO CAPS
0.4000 mg | ORAL_CAPSULE | Freq: Every day | ORAL | Status: DC
  Administered 2021-01-20: 0.4 mg via ORAL
  Filled 2021-01-20 (×2): qty 1

## 2021-01-20 MED ORDER — SENNOSIDES-DOCUSATE SODIUM 8.6-50 MG PO TABS: 1.0000 | ORAL_TABLET | Freq: Every evening | ORAL | Status: DC | PRN

## 2021-01-20 MED ORDER — ROSUVASTATIN CALCIUM 20 MG PO TABS
20.0000 mg | ORAL_TABLET | Freq: Every day | ORAL | Status: DC
Start: 2021-01-20 — End: 2021-01-21
  Administered 2021-01-20 – 2021-01-21 (×2): 20 mg via ORAL
  Filled 2021-01-20 (×2): qty 1

## 2021-01-20 MED ORDER — STROKE: EARLY STAGES OF RECOVERY BOOK
Freq: Once | Status: AC
  Filled 2021-01-20: qty 1

## 2021-01-20 MED ORDER — ALLOPURINOL 100 MG PO TABS
100.0000 mg | ORAL_TABLET | Freq: Every day | ORAL | Status: DC
  Administered 2021-01-21: 100 mg via ORAL
  Filled 2021-01-20: qty 1

## 2021-01-20 MED ORDER — SODIUM CHLORIDE 0.9 % IV SOLN: INTRAVENOUS | Status: DC

## 2021-01-20 MED ORDER — ACETAMINOPHEN 160 MG/5ML PO SOLN: 650.0000 mg | ORAL | Status: DC | PRN

## 2021-01-20 NOTE — ED Provider Notes (Signed)
Patient admitted for lightheadedness dysarthria blurred vision.  Awaiting bed availability at [facility].  Was alerted by the nurse that patient is experiencing some more word difficulty.  He says this is typical of his complex migraine that he struggled with for many years.  Asking to take his Fioricet.  Patient is otherwise neuro intact.  I feel this is reasonable at this time.  Patient counseled to let us know if he has any further neurologic symptoms.   Hayden Rasmussen, MD 01/20/21 [place]ly [place]

## 2021-01-20 NOTE — Progress Notes (Signed)
°  Echocardiogram 2D Echocardiogram has been performed.  Sean Willis 01/20/2021, 4:42 PM

## 2021-01-20 NOTE — ED Notes (Addendum)
Patients wife came out of room to inform this nurse that patient feels his migraine is coming back. States he has been getting this recently, normally takes his fioricet and the symptoms improve. Dr. Melina Copa called to bedside to examine patient. Pt able to speak full sentences and move all extremities equally. Dr. Melina Copa gives OK for patient to take his home Fioricet. Pt states he feels his speech is getting "different", and his vision is changing.

## 2021-01-20 NOTE — Evaluation (Signed)
Physical Therapy Evaluation Patient Details Name: Sean Willis MRN: [id number] DOB: 1945/09/09 Today's Date: 01/20/2021  History of Present Illness  76 yo male presents to [facility] on 1/19 with sudden onset lightheadedness, diaphoresis, nausea, blurred vision lasting 20 minutes. MRI shows no acute intracranial abnormality, multiple small T2 hyperintense foci within bilat basal ganglia/internal capsules same as 12/2018. CTH no acute changes. PMH includes complex migraines with admission 12/2018, CAD status post CABG 4 2013, HTN, afib, CKD, cardiomyopathy, HLD, OA.  Clinical Impression   Pt presents with [facility] balance, strength, gait parameters and speed, and activity tolerance. Pt ambulated great hallway distance and ascended flight of steps with no deficits noted, scored 24/24 on DGI indicating no increased fall risk. Pt endorses being back to baseline. PT reviewed s/s consistent with CVA/TIA in case of further instances of symptoms, pt appreciates that his migraine symptoms and CVA symptoms overlap quite a bit and states he will come to be evaluated if symptoms arise in the future. PT to sign off, no further needs.     Recommendations for follow up therapy are one component of a multi-disciplinary discharge planning process, led by the attending physician.  Recommendations may be updated based on patient status, additional functional criteria and insurance authorization.  Follow Up Recommendations No PT follow up    Assistance Recommended at Discharge None  Patient can return home with the following       Equipment Recommendations None recommended by PT  Recommendations for Other Services       Functional Status Assessment Patient has not had a recent decline in their functional status     Precautions / Restrictions Precautions Precautions: None Restrictions Weight Bearing Restrictions: No      Mobility  Bed Mobility Overal bed mobility: Independent                   Transfers Overall transfer level: Independent                      Ambulation/Gait Ambulation/Gait assistance: Independent Gait Distance (Feet): 500 Feet Assistive device: None Gait Pattern/deviations: WFL(Within Functional Limits) Gait velocity: wfl     General Gait Details: pt with great gait speed, no evidence of imbalance even during balance challenges (see balance section)  Stairs Stairs: Yes Stairs assistance: Modified independent (Device/Increase time) Stair Management: No rails, Alternating pattern, Forwards Number of Stairs: 10 General stair comments: slightly increased time, no need for rail use  Wheelchair Mobility    Modified Rankin (Stroke Patients Only) Modified Rankin (Stroke Patients Only) Pre-Morbid Rankin Score: No symptoms Modified Rankin: No symptoms     Balance Overall balance assessment: Independent                               Standardized Balance Assessment Standardized Balance Assessment : Dynamic Gait Index   Dynamic Gait Index Level Surface: Normal Change in Gait Speed: Normal Gait with Horizontal Head Turns: Normal Gait with Vertical Head Turns: Normal Gait and Pivot Turn: Normal Step Over Obstacle: Normal Step Around Obstacles: Normal Steps: Normal Total Score: 24       Pertinent Vitals/Pain Pain Assessment Pain Assessment: No/denies pain    Home Living Family/patient expects to be discharged to:: Private residence Living Arrangements: Alone Available Help at Discharge: Family Type of Home: House Home Access: Stairs to enter   Technical brewer of Steps: 4     Home Equipment: None  Prior Function Prior Level of Function : Independent/Modified Independent             Mobility Comments: pt enjoys golfing, playing basketball with his granddaughter, working in his yard       Hand Dominance   Dominant Hand: Right    Extremity/Trunk Assessment   Upper Extremity  Assessment Upper Extremity Assessment: Overall WFL for tasks assessed    Lower Extremity Assessment Lower Extremity Assessment: Overall WFL for tasks assessed    Cervical / Trunk Assessment Cervical / Trunk Assessment: Normal  Communication   Communication: No difficulties  Cognition Arousal/Alertness: Awake/alert Behavior During Therapy: WFL for tasks assessed/performed Overall Cognitive Status: Within Functional Limits for tasks assessed                                          General Comments      Exercises     Assessment/Plan    PT Assessment Patient does not need any further PT services  PT Problem List         PT Treatment Interventions      PT Goals (Current goals can be found in the Care Plan section)  Acute Rehab PT Goals PT Goal Formulation: With patient Time For Goal Achievement: 01/20/21 Potential to Achieve Goals: Good    Frequency       Co-evaluation               AM-PAC PT "6 Clicks" Mobility  Outcome Measure Help needed turning from your back to your side while in a flat bed without using bedrails?: None Help needed moving from lying on your back to sitting on the side of a flat bed without using bedrails?: None Help needed moving to and from a bed to a chair (including a wheelchair)?: None Help needed standing up from a chair using your arms (e.g., wheelchair or bedside chair)?: None Help needed to walk in hospital room?: None Help needed climbing 3-5 steps with a railing? : None 6 Click Score: 24    End of Session   Activity Tolerance: Patient tolerated treatment well Patient left: in bed;with family/visitor present Nurse Communication: Mobility status PT Visit Diagnosis: Other abnormalities of gait and mobility (R26.89)    Time: 1649-1700 PT Time Calculation (min) (ACUTE ONLY): 11 min   Charges:   PT Evaluation $PT Eval Low Complexity: 1 Low        Dolores Ewing S, PT DPT Acute Rehabilitation Services Pager  [phone]  Office [phone]   Roxine Caddy E Ruffin Pyo 01/20/2021, 5:27 PM

## 2021-01-20 NOTE — ED Notes (Signed)
Report given to [facility]. 

## 2021-01-20 NOTE — Progress Notes (Signed)
°  Transition of Care [facility]) Screening Note   Patient Details  Name: Sean Willis Date of Birth: 1945-03-03   Transition of Care [facility]) CM/SW Contact:    Pollie Friar, RN Phone Number: 01/20/2021, 3:15 PM    Transition of Care Department [facility]) has reviewed patient and no TOC needs have been identified at this time. We will continue to monitor patient advancement through interdisciplinary progression rounds. If new patient transition needs arise, please place a TOC consult.

## 2021-01-20 NOTE — H&P (Signed)
History and Physical    Sean Willis OIZ:[id number] DOB: 09-25-1945 DOA: 01/19/2021  Referring MD/NP/PA: Shela Leff, MD PCP: [name], MD  Patient coming from: transfer from [place]  Chief Complaint: Slurred speech and blurry vision  I have personally briefly reviewed patient's old medical records in [place]   HPI: Sean Willis is a 76 y.o. male with medical history significant of hypertension, hyperlipidemia, chronic atrial fibrillation on anticoagulation, CAD  s/p CABGx4v in2013,  and BPH presents after an episode of slurred speech and blurry vision that occurred 2 days ago.  He was in  gambling at the time when symptoms occurred.  He recalls feeling lightheaded prior to having the slurred speech and vision changes.   He has had to sit down, but noted that symptoms persisted.  After getting back to his hotel room he was able to take Fioricet which he normally takes for migraines, although at that time he did not feel like he had a headache.  After taking a nap and waking up patient reported feeling better.  He has had some continued discomfort in his shoulders, but denies any focal weakness.  He reports that he has had similar symptoms like this previously in the past in 2020 and 2021 where he was worked up for the possibility of a TIA/stroke.  Notes that he was told symptoms were likely caused by complex migraines. The only thing that was different than with the previous episodes was that he broke out into a cold sweat.  He recently patient notes that he has been under a lot of stress, fatigue, and not sleeping well with Ambien.  He had had an televisit with his cardiologist yesterday and due to his symptoms he was advised to go to the emergency department for further evaluation.  ED Course: CT scan of the brain showed stable chronic small vessel ischemic changes with no acute abnormality.  Labs from 1/19 significant for BUN 24, creatinine 1.41, alcohol  level was undetectable, and high-sensitivity troponins negative x2.  Urinalysis did not note any significant signs of infection.  UDS positive for barbiturates(likely from patient Fioricet).  Review of Systems  Constitutional:  Positive for diaphoresis and malaise/fatigue. Negative for fever.  HENT:  Negative for nosebleeds.   Eyes:  Negative for photophobia and pain.  Respiratory:  Negative for cough and shortness of breath.   Cardiovascular:  Negative for chest pain and leg swelling.  Gastrointestinal:  Negative for abdominal pain, nausea and vomiting.  Genitourinary:  Negative for dysuria.  Musculoskeletal:  Positive for joint pain. Negative for falls.  Neurological:  Positive for dizziness and speech change. Negative for focal weakness and loss of consciousness.  Psychiatric/Behavioral:  The patient has insomnia.    Past Medical History:  Diagnosis Date   Benign essential HTN 11/24/2013   BPH (benign prostatic hyperplasia)    patient unaware   CAD (coronary artery disease), native coronary artery 04/2011   S/P CABG X4 Sx done [place]   Carotid artery stenosis    1-39% bilateral by dopplers 2018   Chest wall pain    Right side, muscular,ortho,MRI. U/S abd ok. On lyrica   Chronic atrial fibrillation ([place])    s/p DCCV 01/2016, subsequent reversion, patient denied AAD   Chronic insomnia    CKD (chronic kidney disease), stage III (HCC)    Colon polyp 10/11   DDD (degenerative disc disease), lumbar    Dilatation of aorta (HCC)    aortic root is mildly  enlarged at 47mm and moderately enlarged ascending aorta at 53mm by echo 03/2020   ED (erectile dysfunction)    GERD (gastroesophageal reflux disease)    Gout    History of kidney stones    History of migraine    Ischemic cardiomyopathy    EF 35-40% with moderate RV dysfunction by echo 03/2020 and unchanged from echo 2018   Kidney stone    uric acid, Dr [name]  renal insufficiency   Mitral regurgitation 06/04/2016    Moderate by echo 2020   Mixed hyperlipidemia 01/23/2013   Myocardial infarction ([place])    OSA on CPAP 07/16/2016   Osteoarthritis    of the knee   TIA (transient ischemic attack)    questionable Mini stroke per patient    Past Surgical History:  Procedure Laterality Date   CARDIOVERSION [name] 01/20/2016   Procedure: CARDIOVERSION;  Surgeon: Thayer Headings, MD;  Location: [facility];  Service: Cardiovascular;  Laterality: [name];   CARDIOVERSION [name] 03/23/2016   Procedure: CARDIOVERSION;  Surgeon: Sueanne Margarita, MD;  Location: [facility];  Service: Cardiovascular;  Laterality: [name];   CARDIOVERSION     CATARACT EXTRACTION W/PHACO Right 10/26/2014   Procedure: CATARACT EXTRACTION PHACO AND INTRAOCULAR LENS PLACEMENT ([place]);  Surgeon: [name], MD;  Location: [facility];  Service: Ophthalmology;  Laterality: Right;  LOT PACK: [id number] H US:01:43.0 AP:27.8 CDE:28.68   CATARACT EXTRACTION W/PHACO Left 11/13/2016   Procedure: CATARACT EXTRACTION PHACO AND INTRAOCULAR LENS PLACEMENT ([facility]);  Surgeon: [name], MD;  Location: [facility];  Service: Ophthalmology;  Laterality: Left;  [place] 01:00.2 AP% 15.7 CDE 9.47 Fluid Pack lot # [id number]   COLONOSCOPY WITH PROPOFOL [name] 09/27/2015   Procedure: COLONOSCOPY WITH PROPOFOL;  Surgeon: [name], MD;  Location: [facility];  Service: Endoscopy;  Laterality: [name];   CORONARY ARTERY BYPASS GRAFT  04/2011   Severe multivessel ASCAD s/p Stemi w PTCA of OM2 then s/p CABG w LIMA to LAD,SVG to OM1 /RI and SVG to Distal RCA    CYSTOSCOPY/URETEROSCOPY/HOLMIUM LASER/STENT PLACEMENT Left 12/21/2019   Procedure: CYSTOSCOPY/RETROGREADE PYLEOGRAM/URETEROSCOPY/HOLMIUM LASER/STENT PLACEMENT;  Surgeon: Janith Lima, MD;  Location: [facility];  Service: Urology;  Laterality: Left;  ONLY NEEDS 60  MIN   FINGER SURGERY     left finger for ganglion cyst   KIDNEY STONE SURGERY     LEFT HEART CATH AND CORS/GRAFTS ANGIOGRAPHY [name] 12/02/2018   Procedure: LEFT  HEART CATH AND CORS/GRAFTS ANGIOGRAPHY;  Surgeon: Belva Crome, MD;  Location: [facility];  Service: Cardiovascular;  Laterality: [name];   TONSILLECTOMY       reports that he has never smoked. He has never used smokeless tobacco. He reports that he does not currently use alcohol. He reports that he does not use drugs.  Allergies  Allergen Reactions   Atorvastatin     Joint pain on 80mg  daily   Celebrex [Celecoxib]     Upset stomach   Entresto [Sacubitril-Valsartan]     Low BP/difficulty talking/migraine    Family History  Problem Relation Age of Onset   Alzheimer's disease Mother    Cancer Mother    CVA Father    Arrhythmia Father    Arrhythmia Sister     Prior to Admission medications   Medication Sig Start Date End Date Taking? Authorizing Provider  XARELTO 20 MG TABS tablet TAKE 1 TABLET BY MOUTH DAILY WITH SUPPER. 12/19/20  Yes Turner, Traci R, MD  allopurinol (ZYLOPRIM) 100 MG tablet Take 100 mg by  mouth daily.    [provider]  Butalbital-APAP-Caffeine [phone] MG capsule Take 1 capsule by mouth as needed for migraine. 10/30/19   [provider]  docusate sodium (COLACE) 100 MG capsule Take 1 capsule (100 mg total) by mouth daily as needed for up to 30 doses. 12/21/19   Janith Lima, MD  doxepin (SINEQUAN) 25 MG capsule Take 25 mg by mouth at bedtime.    [provider]  escitalopram (LEXAPRO) 10 MG tablet Take 10 mg by mouth daily as needed (depression). 11/17/19   [provider]  esomeprazole (NEXIUM) 20 MG packet Take 20 mg by mouth daily before breakfast.    [provider]  isosorbide mononitrate (IMDUR) 30 MG 24 hr tablet Take 0.5 tablets (15 mg total) by mouth daily. 06/15/20   Sueanne Margarita, MD  losartan (COZAAR) 25 MG tablet Take 1 tablet (25 mg total) by mouth daily as needed (high blood pressure). 06/15/20   Sueanne Margarita, MD  metoprolol tartrate (LOPRESSOR) 50 MG tablet TAKE 1 TABLET BY MOUTH TWICE A  DAY 11/14/20   Turner, Eber Hong, MD  mometasone (NASONEX) 50 MCG/ACT nasal spray Place 2 sprays into the nose daily as needed (congestion).    [provider]  nitroGLYCERIN (NITROSTAT) 0.4 MG SL tablet Place 1 tablet (0.4 mg total) under the tongue every 5 (five) minutes as needed for chest pain. 12/23/18 01/19/21  Burtis Junes, NP  rosuvastatin (CRESTOR) 20 MG tablet Take 1 tablet (20 mg total) by mouth daily. 07/11/20   Sueanne Margarita, MD  tamsulosin (FLOMAX) 0.4 MG CAPS capsule Take 0.4 mg by mouth daily. 09/14/19   [provider]  VASCEPA 1 g capsule Take 2 capsules (2 g total) by mouth 2 (two) times daily. Patient taking differently: Take 1 g by mouth daily. 07/11/20   Sueanne Margarita, MD  zolpidem (AMBIEN) 5 MG tablet Take 5 mg by mouth at bedtime. 10/19/16   [provider]    Physical Exam:  Constitutional: Elderly male in no acute distress Vitals:   01/20/21 1030 01/20/21 1040 01/20/21 1045 01/20/21 1149  BP: (!) 143/111  (!) 153/113 (!) 150/113  Pulse: 80  80 74  Resp: 13  13 20   Temp:  97.6 F (36.4 C)  97.6 F (36.4 C)  TempSrc:  Oral  Oral  SpO2: 97%  97% 96%  Weight:      Height:       Eyes: PERRL, lids and conjunctivae normal ENMT: Mucous membranes are moist. Posterior pharynx clear of any exudate or lesions.  Neck: normal, supple, no masses, no thyromegaly Respiratory: clear to auscultation bilaterally, no wheezing, no crackles. Normal respiratory effort.  Cardiovascular: Regular rate and rhythm, no murmurs / rubs / gallops. No extremity edema.  Abdomen: no tenderness, no masses palpated. No hepatosplenomegaly. Bowel sounds positive.  Musculoskeletal: no clubbing / cyanosis. No joint deformity upper and lower extremities. Good ROM, no contractures. Normal muscle tone.  Skin: no rashes, lesions, ulcers. No induration Neurologic: CN 2-12 grossly intact. Sensation intact, DTR normal. Strength 5/5 in all 4.  Psychiatric: Normal judgment and  insight. Alert and oriented x 3. Normal mood.     Labs on Admission: I have personally reviewed following labs and imaging studies  CBC: Recent Labs  Lab 01/19/21 1602  WBC 8.0  NEUTROABS 5.2  HGB 16.2  HCT 47.2  MCV 94.2  PLT 102   Basic Metabolic Panel: Recent Labs  Lab 01/19/21 1602  NA 137  K 4.0  CL 105  CO2 23  GLUCOSE 107*  BUN 24*  CREATININE 1.41*  CALCIUM 9.0   GFR: Estimated Creatinine Clearance: 49.6 mL/min (A) (by C-G formula based on SCr of 1.41 mg/dL (H)). Liver Function Tests: Recent Labs  Lab 01/19/21 1602  AST 20  ALT 23  ALKPHOS 69  BILITOT 0.6  PROT 7.3  ALBUMIN 4.2   No results for input(s): LIPASE, AMYLASE in the last 168 hours. No results for input(s): AMMONIA in the last 168 hours. Coagulation Profile: Recent Labs  Lab 01/19/21 1602  INR 1.1   Cardiac Enzymes: No results for input(s): CKTOTAL, CKMB, CKMBINDEX, TROPONINI in the last 168 hours. BNP (last 3 results) No results for input(s): PROBNP in the last 8760 hours. HbA1C: No results for input(s): HGBA1C in the last 72 hours. CBG: Recent Labs  Lab 01/19/21 1752  GLUCAP 87   Lipid Profile: No results for input(s): CHOL, HDL, LDLCALC, TRIG, CHOLHDL, LDLDIRECT in the last 72 hours. Thyroid Function Tests: No results for input(s): TSH, T4TOTAL, FREET4, T3FREE, THYROIDAB in the last 72 hours. Anemia Panel: No results for input(s): VITAMINB12, FOLATE, FERRITIN, TIBC, IRON, RETICCTPCT in the last 72 hours. Urine analysis:    Component Value Date/Time   COLORURINE YELLOW 01/19/2021 [place] 01/19/2021 1755   LABSPEC 1.025 01/19/2021 1755   PHURINE 5.5 01/19/2021 1755   GLUCOSEU NEGATIVE 01/19/2021 1755   HGBUR SMALL (A) 01/19/2021 [place] NEGATIVE 01/19/2021 1755   KETONESUR NEGATIVE 01/19/2021 1755   PROTEINUR NEGATIVE 01/19/2021 1755   NITRITE NEGATIVE 01/19/2021 1755   LEUKOCYTESUR NEGATIVE 01/19/2021 1755   Sepsis Labs: Recent  Results (from the past 240 hour(s))  Resp Panel by RT-PCR (Flu A&B, Covid) Nasopharyngeal Swab     Status: None   Collection Time: 01/19/21  5:55 PM   Specimen: Nasopharyngeal Swab; Nasopharyngeal(NP) swabs in vial transport medium  Result Value Ref Range Status   SARS Coronavirus 2 by RT PCR NEGATIVE NEGATIVE Final    Comment: (NOTE) SARS-CoV-2 target nucleic acids are NOT DETECTED.  The SARS-CoV-2 RNA is generally detectable in upper respiratory specimens during the acute phase of infection. The lowest concentration of SARS-CoV-2 viral copies this assay can detect is 138 copies/mL. A negative result does not preclude SARS-Cov-2 infection and should not be used as the sole basis for treatment or other patient management decisions. A negative result may occur with  improper specimen collection/handling, submission of specimen other than nasopharyngeal swab, presence of viral mutation(s) within the areas targeted by this assay, and inadequate number of viral copies(<138 copies/mL). A negative result must be combined with clinical observations, patient history, and epidemiological information. The expected result is Negative.  Fact Sheet for Patients:  EntrepreneurPulse.com.au  Fact Sheet for Healthcare Providers:  IncredibleEmployment.be  This test is no t yet approved or cleared by the [place] FDA and  has been authorized for detection and/or diagnosis of SARS-CoV-2 by FDA under an Emergency Use Authorization (EUA). This EUA will remain  in effect (meaning this test can be used) for the duration of the COVID-19 declaration under Section 564(b)(1) of the Act, 21 U.S.C.section 360bbb-3(b)(1), unless the authorization is terminated  or revoked sooner.       Influenza A by PCR NEGATIVE NEGATIVE Final   Influenza B by PCR NEGATIVE NEGATIVE Final    Comment: (NOTE) The Xpert Xpress SARS-CoV-2/FLU/RSV plus assay is intended as an aid in the  diagnosis of influenza from  Nasopharyngeal swab specimens and should not be used as a sole basis for treatment. Nasal washings and aspirates are unacceptable for Xpert Xpress SARS-CoV-2/FLU/RSV testing.  Fact Sheet for Patients: EntrepreneurPulse.com.au  Fact Sheet for Healthcare Providers: IncredibleEmployment.be  This test is not yet approved or cleared by the [place] FDA and has been authorized for detection and/or diagnosis of SARS-CoV-2 by FDA under an Emergency Use Authorization (EUA). This EUA will remain in effect (meaning this test can be used) for the duration of the COVID-19 declaration under Section 564(b)(1) of the Act, 21 U.S.C. section 360bbb-3(b)(1), unless the authorization is terminated or revoked.  Performed at [facility], [address] [place], [place] [address]      Radiological Exams on Admission: CT Head Wo Contrast  Result Date: 01/19/2021 CLINICAL DATA:  Blurred vision, dizziness, diaphoresis, slurred speech yesterday EXAM: CT HEAD WITHOUT CONTRAST TECHNIQUE: Contiguous axial images were obtained from the base of the skull through the vertex without intravenous contrast. RADIATION DOSE REDUCTION: This exam was performed according to the departmental dose-optimization program which includes automated exposure control, adjustment of the mA and/or kV according to patient size and/or use of iterative reconstruction technique. COMPARISON:  12/03/2018 FINDINGS: Brain: Stable hypodensities within the periventricular white matter and bilateral basal ganglia, consistent with chronic small vessel ischemic changes. No acute infarct or hemorrhage. Lateral ventricles and midline structures are unremarkable. No acute extra-axial fluid collections. No mass effect. Vascular: Stable atherosclerosis.  No hyperdense vessel. Skull: Normal. Negative for fracture or focal lesion. Sinuses/Orbits: No acute finding. Other: None.  IMPRESSION: 1. Stable chronic small vessel ischemic change. No acute intracranial process. Electronically Signed   By: Randa Ngo M.D.   On: 01/19/2021 17:27    EKG: Independently reviewed.  Atrial fibrillation at 91 bpm  Assessment/Plan Slurred speech and visual change: Acute.  Patient presents after having episode of blurry vision with slurred speech, lightheadedness, and cold sweat 2 days ago.  Initial CT scan of the head did not note any acute abnormality.  Urine drug screen was positive for barbiturates, but patient takes Fioricet for history of migraines.  Question if symptoms were secondary to TIA vs. complex migraine vs. orthostatic hypotension/vagal event. -Admit to telemetry bed -Stroke order set initiated -Neuro checks -Check orthostatic vital signs -Check MRI of the brain-did not note any signs of an acute infarct with moderate chronic small vessel disease not significantly changed from previous MRI -PT/OT/Speech to eval and treat -Check echocardiogram -Check Hemoglobin A1c and lipid panel -Appreciate neurology consultative services, will follow-up for further recommendations  Chronic kidney disease stage IIIa: Creatinine elevated at 1.41 with BUN 24.  Patient's baseline creatinine previously noted to be 1.2-1.3 -Normal saline IV fluids at 100 mL/h -Recheck BMPin am  Heart failure with reduced EF: Last EF noted to be 35 to 40% or global hypokinesis diffuse hypokinesis in 11/2018.  Patient does not appear grossly fluid overloaded at this time. -Daily weights  -Monitor intake and output -Follow-up echocardiogram   Chronic atrial fibrillation on chronic anticoagulation: Patient remains in atrial fibrillation but rate controlled. -Continue Xarelto  Essential hypertension: Blood pressures have remained stable. Home blood pressure regimen includes Metoprolol 50 mg twice daily and losartan 25 mg daily as needed for elevated blood pressures. -Continue metoprolol  CAD: Patient  with a prior history of CABG four-vessel in 2013 secondary to MI.  Last nuclear stress test from 11/2018 noted no ischemia.  On aspirin due to being on an coagulation. -Continue Imdur, Lopressor,  atorvastatin  Mixed hyperlipidemia: Home medication regimen includes Crestor 20 mg nightly -Follow-up lipid panel -Continue statin and adjust dose if warranted  BPH -Continue Flomax  Insomnia: Patient reports that Ambien is no longer working. -Xanax as needed for sleep   DVT prophylaxis: Xarelto Code Status: Full Family Communication: None Disposition Plan: Hopefully discharge home in a.m. Consults called: Neurology Admission status: Observation  Norval Morton MD [facility]   If 7PM-7AM, please contact night-coverage   01/20/2021, 11:59 AM

## 2021-01-20 NOTE — ED Notes (Signed)
Attempted to call report to 3W nurse, nurse busy, left call back number

## 2021-01-20 NOTE — ED Notes (Signed)
Pt reports that he does not have a pacemaker

## 2021-01-21 ENCOUNTER — Observation Stay (HOSPITAL_COMMUNITY): Payer: Medicare Other

## 2021-01-21 DIAGNOSIS — R4781 Slurred speech: Secondary | ICD-10-CM

## 2021-01-21 DIAGNOSIS — I10 Essential (primary) hypertension: Secondary | ICD-10-CM | POA: Diagnosis not present

## 2021-01-21 DIAGNOSIS — R479 Unspecified speech disturbances: Secondary | ICD-10-CM | POA: Diagnosis not present

## 2021-01-21 DIAGNOSIS — E782 Mixed hyperlipidemia: Secondary | ICD-10-CM | POA: Diagnosis not present

## 2021-01-21 DIAGNOSIS — H539 Unspecified visual disturbance: Secondary | ICD-10-CM | POA: Diagnosis not present

## 2021-01-21 DIAGNOSIS — N1831 Chronic kidney disease, stage 3a: Secondary | ICD-10-CM | POA: Diagnosis not present

## 2021-01-21 DIAGNOSIS — I2510 Atherosclerotic heart disease of native coronary artery without angina pectoris: Secondary | ICD-10-CM | POA: Diagnosis not present

## 2021-01-21 DIAGNOSIS — I4819 Other persistent atrial fibrillation: Secondary | ICD-10-CM | POA: Diagnosis not present

## 2021-01-21 LAB — LIPID PANEL
Cholesterol: 132 mg/dL (ref 0–200)
HDL: 28 mg/dL — ABNORMAL LOW (ref >40)
LDL Cholesterol: 73 mg/dL (ref 0–99)
Total CHOL/HDL Ratio: 4.7 RATIO
Triglycerides: 154 mg/dL — ABNORMAL HIGH (ref <150)
VLDL: 31 mg/dL (ref 0–40)

## 2021-01-21 LAB — BASIC METABOLIC PANEL
Anion gap: 5 (ref 5–15)
BUN: 16 mg/dL (ref 8–23)
CO2: 21 mmol/L — ABNORMAL LOW (ref 22–32)
Calcium: 8.3 mg/dL — ABNORMAL LOW (ref 8.9–10.3)
Chloride: 109 mmol/L (ref 98–111)
Creatinine, Ser: 1.1 mg/dL (ref 0.61–1.24)
GFR, Estimated: >60 mL/min (ref >60)
Glucose, Bld: 105 mg/dL — ABNORMAL HIGH (ref 70–99)
Potassium: 3.8 mmol/L (ref 3.5–5.1)
Sodium: 135 mmol/L (ref 135–145)

## 2021-01-21 MED ORDER — TAMSULOSIN HCL 0.4 MG PO CAPS: 0.4000 mg | ORAL_CAPSULE | Freq: Every evening | ORAL | Status: DC

## 2021-01-21 MED ORDER — METOPROLOL TARTRATE 25 MG PO TABS: 25.0000 mg | ORAL_TABLET | Freq: Two times a day (BID) | ORAL | 0 refills | Status: DC

## 2021-01-21 NOTE — Discharge Summary (Signed)
Physician Discharge Summary  Sean Willis XBD:[id number] DOB: 1945-02-10 DOA: 01/19/2021  PCP: [name], MD  Admit date: 01/19/2021 Discharge date: 01/21/2021  Admitted From: Home Disposition: Home   Recommendations for Outpatient Follow-up:  Follow up with PCP in 1-2 weeks for ongoing management of complex migraines.  Home Health: None Equipment/Devices: None Discharge Condition: Stable CODE STATUS: Full Diet recommendation: Heart healthy  Brief/Interim Summary:  Sean Willis is a 76 y.o. male with medical history significant of complex migraine, hypertension, hyperlipidemia, chronic atrial fibrillation on anticoagulation, CAD  s/p CABGx4v in 2013, and BPH presented on 1/19 after an episode of slurred speech and blurry vision that occurred 2 days ago. CT scan of the brain showed stable chronic small vessel ischemic changes with no acute abnormality.  Labs from 1/19 significant for BUN 24, creatinine 1.41, alcohol level was undetectable, and high-sensitivity troponins negative x2.  Urinalysis did not note any significant signs of infection.  UDS positive for barbiturates(likely from patient Fioricet). Neurology was consulted, suspecting complex migraines. EEG was performed and ruled out active partial seizure. Having resolved symptoms, neurology has cleared him for discharge.  Discharge Diagnoses:  Principal Problem:   Slurred speech Active Problems:   CAD (coronary artery disease)   Mixed hyperlipidemia   Benign essential HTN   Persistent atrial fibrillation (HCC)   Visual changes   CKD (chronic kidney disease), stage III (HCC)  Chronic medical conditions were stable during period of observation.  Discharge Instructions Discharge Instructions     Diet - low sodium heart healthy   Complete by: As directed    Discharge instructions   Complete by: As directed    Your EEG was normal, so the symptoms are most likely due to complex migraine. To help reduce the  frequency/severity of these episodes, neurology has recommended nortriptyline 25mg  nightly. However, this is a drug in the same class as doxepin which you are already taking. Your PCP may consider switching, though no change will be made at this time. You will need to take a lower dose of metoprolol at home (down from 50mg  twice daily to 25mg  twice daily) because your heart rate was occasionally slow while you were monitored in the hospital. Please follow up with your PCP in the next 1-2 weeks to see if adjustments need to be made, or seek medical attention right away if your symptoms worsen.   Increase activity slowly   Complete by: As directed       Allergies as of 01/21/2021       Reactions   Atorvastatin    Joint pain on 80mg  daily   Celebrex [celecoxib]    Upset stomach   Entresto [sacubitril-valsartan]    Low BP/difficulty talking/migraine        Medication List     TAKE these medications    allopurinol 100 MG tablet Commonly known as: ZYLOPRIM Take 100 mg by mouth daily.   Butalbital-APAP-Caffeine 50-325-40 MG capsule Take 1 capsule by mouth every 6 (six) hours as needed for migraine.   docusate sodium 100 MG capsule Commonly known as: Colace Take 1 capsule (100 mg total) by mouth daily as needed for up to 30 doses. What changed: reasons to take this   doxepin 25 MG capsule Commonly known as: SINEQUAN Take 25 mg by mouth at bedtime.   escitalopram 10 MG tablet Commonly known as: LEXAPRO Take 10 mg by mouth daily as needed (depression).   esomeprazole 20 MG packet Commonly known as: NEXIUM Take 20 mg  by mouth daily before breakfast.   isosorbide mononitrate 30 MG 24 hr tablet Commonly known as: IMDUR Take 0.5 tablets (15 mg total) by mouth daily.   losartan 25 MG tablet Commonly known as: COZAAR Take 1 tablet (25 mg total) by mouth daily as needed (high blood pressure). What changed: when to take this   metoprolol tartrate 25 MG tablet Commonly known as:  LOPRESSOR Take 1 tablet (25 mg total) by mouth 2 (two) times daily. What changed:  medication strength how much to take   mometasone 50 MCG/ACT nasal spray Commonly known as: NASONEX Place 2 sprays into the nose daily as needed (congestion).   nitroGLYCERIN 0.4 MG SL tablet Commonly known as: NITROSTAT Place 1 tablet (0.4 mg total) under the tongue every 5 (five) minutes as needed for chest pain.   rosuvastatin 20 MG tablet Commonly known as: CRESTOR Take 1 tablet (20 mg total) by mouth daily.   tamsulosin 0.4 MG Caps capsule Commonly known as: FLOMAX Take 0.4 mg by mouth daily.   Vascepa 1 g capsule Generic drug: icosapent Ethyl Take 2 capsules (2 g total) by mouth 2 (two) times daily. What changed:  how much to take when to take this   Xarelto 20 MG Tabs tablet Generic drug: rivaroxaban TAKE 1 TABLET BY MOUTH DAILY WITH SUPPER. What changed:  how much to take how to take this   zolpidem 5 MG tablet Commonly known as: AMBIEN Take 5 mg by mouth at bedtime.        Follow-up Information     [name], MD Follow up.   Specialty: Internal Medicine Contact information: [address], [address] [place] [address] [phone]         Sueanne Margarita, MD .   Specialty: Cardiology Contact information: [phone] [address] [place] [address] [phone]                Allergies  Allergen Reactions   Atorvastatin     Joint pain on 80mg  daily   Celebrex [Celecoxib]     Upset stomach   Entresto [Sacubitril-Valsartan]     Low BP/difficulty talking/migraine    Consultations: Neurology  Procedures/Studies: CT Head Wo Contrast  Result Date: 01/19/2021 CLINICAL DATA:  Blurred vision, dizziness, diaphoresis, slurred speech yesterday EXAM: CT HEAD WITHOUT CONTRAST TECHNIQUE: Contiguous axial images were obtained from the base of the skull through the vertex without intravenous contrast. RADIATION DOSE REDUCTION: This exam  was performed according to the departmental dose-optimization program which includes automated exposure control, adjustment of the mA and/or kV according to patient size and/or use of iterative reconstruction technique. COMPARISON:  12/03/2018 FINDINGS: Brain: Stable hypodensities within the periventricular white matter and bilateral basal ganglia, consistent with chronic small vessel ischemic changes. No acute infarct or hemorrhage. Lateral ventricles and midline structures are unremarkable. No acute extra-axial fluid collections. No mass effect. Vascular: Stable atherosclerosis.  No hyperdense vessel. Skull: Normal. Negative for fracture or focal lesion. Sinuses/Orbits: No acute finding. Other: None. IMPRESSION: 1. Stable chronic small vessel ischemic change. No acute intracranial process. Electronically Signed   By: Randa Ngo M.D.   On: 01/19/2021 17:27   MR BRAIN WO CONTRAST  Result Date: 01/20/2021 CLINICAL DATA:  Provided history: Stroke, follow-up. Additional history provided: Episode of sudden onset lightheadedness, diaphoresis, nausea, dysarthria versus aphasia, blurry vision. EXAM: MRI HEAD WITHOUT CONTRAST TECHNIQUE: Multiplanar, multiecho pulse sequences of the brain and surrounding structures were obtained without intravenous contrast. COMPARISON:  Prior  head CT examinations 01/19/2021. Brain MRI 12/03/2018. CT angiogram head/neck 12/04/2018. FINDINGS: Brain: Mild generalized cerebral atrophy. Moderate multifocal T2 FLAIR hyperintense signal abnormality within the cerebral white matter, nonspecific but compatible with chronic small vessel ischemic disease. As before, there are multiple small T2 hyperintense foci within the bilateral basal ganglia/internal capsules, likely reflecting a combination of prominent perivascular spaces and chronic lacunar infarcts. Redemonstrated chronic microhemorrhages within the left basal ganglia and thalamus, as well as right midbrain. There is no acute infarct.  No evidence of an intracranial mass. No extra-axial fluid collection. No midline shift. Vascular: Maintained flow voids within the proximal large arterial vessels. Skull and upper cervical spine: No focal suspicious marrow lesion. Sinuses/Orbits: Visualized orbits show no acute finding. Trace mucosal thickening within the bilateral ethmoid, right sphenoid and bilateral maxillary sinuses. IMPRESSION: No evidence of acute intracranial abnormality. Moderate chronic small vessel ischemic disease within the cerebral white matter, not significantly changed from the prior brain MRI of 12/03/2018. As before, there are multiple small T2 hyperintense foci within the bilateral basal ganglia/internal capsules, likely reflecting a combination of prominent perivascular spaces and chronic lacunar infarcts. Mild generalized cerebral atrophy. Electronically Signed   By: Kellie Simmering D.O.   On: 01/20/2021 14:59   EEG adult  Result Date: 01/21/2021 Derek Jack, MD     01/21/2021  3:16 PM Routine EEG Report KOHL POLINSKY is a 76 y.o. male with a history of spells who is undergoing an EEG to evaluate for seizures. Report: This EEG was acquired with electrodes placed according to the International 10-20 electrode system (including Fp1, Fp2, F3, F4, C3, C4, P3, P4, O1, O2, T3, T4, T5, T6, A1, A2, Fz, Cz, Pz). The following electrodes were missing or displaced: none. The occipital dominant rhythm was 9 Hz. This activity is reactive to stimulation. Drowsiness was manifested by background fragmentation; deeper stages of sleep were identified by K complexes and sleep spindles. There was no focal slowing. There were no interictal epileptiform discharges. There were no electrographic seizures identified. There was no abnormal response to photic stimulation or hyperventilation. Impression: This EEG was obtained while awake and asleep and is normal.   Clinical Correlation: Normal EEGs, however, do not rule out epilepsy. Su Monks, MD [facility] [phone] If 7pm- 7am, please page neurology on call as listed in [place].   ECHOCARDIOGRAM COMPLETE  Addendum Date: 01/20/2021   LV function improved compared to prior echo [facility] Electronically Amended 01/20/2021, 5:14 PM   Final Loreli Dollar)    Result Date: 01/20/2021    ECHOCARDIOGRAM REPORT   Patient Name:   NED KAKAR Date of Exam: 01/20/2021 Medical Rec #:  [id number]          Height:       69.0 in Accession #:    [id number]         Weight:       193.0 lb Date of Birth:  09/23/1945          BSA:          2.035 m Patient Age:    76 years           BP:           150/113 mmHg Patient Gender: M                  HR:           90 bpm. Exam Location:  Inpatient Procedure: 2D Echo Indications:    TIA  History:        Patient has prior history of Echocardiogram examinations, most                 recent 03/11/2020. Dilatation of aorta and CAD, Arrythmias:Atrial                 Fibrillation; Risk Factors:Hypertension, Dyslipidemia and Sleep                 Apnea.  Sonographer:    Johny Chess RDCS Referring Phys: [id number] RONDELL A SMITH IMPRESSIONS  1. Left ventricular ejection fraction, by estimation, is 40 to 45%. The left ventricle has mildly decreased function. The left ventricle demonstrates global hypokinesis. Left ventricular diastolic parameters are indeterminate.  2. Right ventricular systolic function is normal. The right ventricular size is normal. Tricuspid regurgitation signal is inadequate for assessing PA pressure.  3. Left atrial size was severely dilated.  4. Right atrial size was mildly dilated.  5. The mitral valve is grossly normal. No evidence of mitral valve regurgitation.  6. The aortic valve is grossly normal. Aortic valve regurgitation is not visualized.  7. Aortic ascending aortic aneurysm 4.2 cm.  8. The inferior vena cava is normal in size with greater than 50% respiratory variability, suggesting right atrial pressure of 3 mmHg. FINDINGS   Left Ventricle: Left ventricular ejection fraction, by estimation, is 40 to 45%. The left ventricle has mildly decreased function. The left ventricle demonstrates global hypokinesis. The left ventricular internal cavity size was normal in size. There is  no left ventricular hypertrophy. Left ventricular diastolic parameters are indeterminate. Right Ventricle: The right ventricular size is normal. No increase in right ventricular wall thickness. Right ventricular systolic function is normal. Tricuspid regurgitation signal is inadequate for assessing PA pressure. Left Atrium: Left atrial size was severely dilated. Right Atrium: Right atrial size was mildly dilated. Pericardium: There is no evidence of pericardial effusion. Mitral Valve: The mitral valve is grossly normal. No evidence of mitral valve regurgitation. Tricuspid Valve: The tricuspid valve is grossly normal. Tricuspid valve regurgitation is not demonstrated. Aortic Valve: The aortic valve is grossly normal. Aortic valve regurgitation is not visualized. Pulmonic Valve: The pulmonic valve was grossly normal. Pulmonic valve regurgitation is not visualized. Aorta: Ascending aortic aneurysm 4.2 cm. Venous: The inferior vena cava is normal in size with greater than 50% respiratory variability, suggesting right atrial pressure of 3 mmHg.  LEFT VENTRICLE PLAX 2D LVIDd:         5.10 cm LVIDs:         4.10 cm LV PW:         1.00 cm LV IVS:        1.20 cm LVOT diam:     2.10 cm LVOT Area:     3.46 cm  RIGHT VENTRICLE             IVC RV S prime:     11.60 cm/s  IVC diam: 1.70 cm TAPSE (M-mode): 1.0 cm LEFT ATRIUM              Index        RIGHT ATRIUM           Index LA diam:        5.20 cm  2.56 cm/m   RA Area:     16.20 cm LA Vol (A2C):   88.5 ml  43.49 ml/m  RA Volume:   41.00 ml  20.15 ml/m LA Vol (A4C):   104.0 ml 51.[place]  ml/m LA Biplane Vol: 98.5 ml  48.40 ml/m   AORTA Ao Root diam: 3.80 cm Ao Asc diam:  4.20 cm  SHUNTS Systemic Diam: 2.10 cm Phineas Inches  Electronically signed by Phineas Inches Signature Date/Time: 01/20/2021/5:13:27 PM    Final (Amended)     Subjective: No complaints, feels well, wants to go home .  Discharge Exam: Vitals:   01/21/21 0745 01/21/21 1342  BP: (!) 150/111 123/84  Pulse: 68 64  Resp: 18 18  Temp: 97.9 F (36.6 C) 97.7 F (36.5 C)  SpO2: 98% 97%   General: Pt is alert, awake, not in acute distress Cardiovascular: RRR, S1/S2 +, no rubs, no gallops Respiratory: CTA bilaterally, no wheezing, no rhonchi Abdominal: Soft, NT, ND, bowel sounds + Extremities: No edema, no cyanosis  Labs: BNP (last 3 results) No results for input(s): BNP in the last 8760 hours. Basic Metabolic Panel: Recent Labs  Lab 01/19/21 1602 01/21/21 0207  NA 137 135  K 4.0 3.8  CL 105 109  CO2 23 21*  GLUCOSE 107* 105*  BUN 24* 16  CREATININE 1.41* 1.10  CALCIUM 9.0 8.3*   Liver Function Tests: Recent Labs  Lab 01/19/21 1602  AST 20  ALT 23  ALKPHOS 69  BILITOT 0.6  PROT 7.3  ALBUMIN 4.2   No results for input(s): LIPASE, AMYLASE in the last 168 hours. No results for input(s): AMMONIA in the last 168 hours. CBC: Recent Labs  Lab 01/19/21 1602  WBC 8.0  NEUTROABS 5.2  HGB 16.2  HCT 47.2  MCV 94.2  PLT 155   Cardiac Enzymes: No results for input(s): CKTOTAL, CKMB, CKMBINDEX, TROPONINI in the last 168 hours. BNP: Invalid input(s): POCBNP CBG: Recent Labs  Lab 01/19/21 1752  GLUCAP 87   D-Dimer No results for input(s): DDIMER in the last 72 hours. Hgb A1c No results for input(s): HGBA1C in the last 72 hours. Lipid Profile Recent Labs    01/21/21 0207  CHOL 132  HDL 28*  LDLCALC 73  TRIG 154*  CHOLHDL 4.7   Thyroid function studies No results for input(s): TSH, T4TOTAL, T3FREE, THYROIDAB in the last 72 hours.  Invalid input(s): FREET3 Anemia work up No results for input(s): VITAMINB12, FOLATE, FERRITIN, TIBC, IRON, RETICCTPCT in the last 72 hours. Urinalysis    Component Value Date/Time    COLORURINE YELLOW 01/19/2021 [place] 01/19/2021 1755   LABSPEC 1.025 01/19/2021 1755   PHURINE 5.5 01/19/2021 1755   GLUCOSEU NEGATIVE 01/19/2021 1755   HGBUR SMALL (A) 01/19/2021 1755   BILIRUBINUR NEGATIVE 01/19/2021 [place] 01/19/2021 1755   PROTEINUR NEGATIVE 01/19/2021 1755   NITRITE NEGATIVE 01/19/2021 1755   LEUKOCYTESUR NEGATIVE 01/19/2021 1755    Microbiology Recent Results (from the past 240 hour(s))  Resp Panel by RT-PCR (Flu A&B, Covid) Nasopharyngeal Swab     Status: None   Collection Time: 01/19/21  5:55 PM   Specimen: Nasopharyngeal Swab; Nasopharyngeal(NP) swabs in vial transport medium  Result Value Ref Range Status   SARS Coronavirus 2 by RT PCR NEGATIVE NEGATIVE Final    Comment: (NOTE) SARS-CoV-2 target nucleic acids are NOT DETECTED.  The SARS-CoV-2 RNA is generally detectable in upper respiratory specimens during the acute phase of infection. The lowest concentration of SARS-CoV-2 viral copies this assay can detect is 138 copies/mL. A negative result does not preclude SARS-Cov-2 infection and should not be used as the sole basis for treatment or other patient management decisions. A negative result may  occur with  improper specimen collection/handling, submission of specimen other than nasopharyngeal swab, presence of viral mutation(s) within the areas targeted by this assay, and inadequate number of viral copies(<138 copies/mL). A negative result must be combined with clinical observations, patient history, and epidemiological information. The expected result is Negative.  Fact Sheet for Patients:  EntrepreneurPulse.com.au  Fact Sheet for Healthcare Providers:  IncredibleEmployment.be  This test is no t yet approved or cleared by the [place] FDA and  has been authorized for detection and/or diagnosis of SARS-CoV-2 by FDA under an Emergency Use Authorization (EUA). This  EUA will remain  in effect (meaning this test can be used) for the duration of the COVID-19 declaration under Section 564(b)(1) of the Act, 21 U.S.C.section 360bbb-3(b)(1), unless the authorization is terminated  or revoked sooner.       Influenza A by PCR NEGATIVE NEGATIVE Final   Influenza B by PCR NEGATIVE NEGATIVE Final    Comment: (NOTE) The Xpert Xpress SARS-CoV-2/FLU/RSV plus assay is intended as an aid in the diagnosis of influenza from Nasopharyngeal swab specimens and should not be used as a sole basis for treatment. Nasal washings and aspirates are unacceptable for Xpert Xpress SARS-CoV-2/FLU/RSV testing.  Fact Sheet for Patients: EntrepreneurPulse.com.au  Fact Sheet for Healthcare Providers: IncredibleEmployment.be  This test is not yet approved or cleared by the [place] FDA and has been authorized for detection and/or diagnosis of SARS-CoV-2 by FDA under an Emergency Use Authorization (EUA). This EUA will remain in effect (meaning this test can be used) for the duration of the COVID-19 declaration under Section 564(b)(1) of the Act, 21 U.S.C. section 360bbb-3(b)(1), unless the authorization is terminated or revoked.  Performed at [facility], [address] [place], [place] [address]     Time coordinating discharge: Approximately 40 minutes  Patrecia Pour, MD  [facility] 01/21/2021, 4:00 PM

## 2021-01-21 NOTE — Consult Note (Signed)
Neurology Consultation Reason for Consult: Visual change Referring Physician: [place]  CC: Visual change  History is obtained from: Patient  HPI: Sean Willis is a 76 y.o. male with a history of recurrent episodes of visual change, word finding difficulty and garbled speech.  He estimates he has had probably somewhere between 20 and 30 of these starting about 4 years ago.  They last anywhere from a few minutes to a few hours.  They are usually associated with a mild headache.  He does have a history of migraines and states that it "does not feel like a migraine" by which she means it is not severe and is not associated with severe photophobia.  It is bifrontal in nature.  He describes a visual changes blurred vision, with some visual obscuration.  He was seen for a similar episode in 2020 and diagnosed with complicated migraines and given Fioricet which he finds to be effective.  He is now back to normal.  He was on atenolol for migraine prevention in the past which was effective for him.  He currently is complaining about insomnia as well, and trying to change off of Ambien.  ROS: A 14 point ROS was performed and is negative except as noted in the HPI.   Past Medical History:  Diagnosis Date   Benign essential HTN 11/24/2013   BPH (benign prostatic hyperplasia)    patient unaware   CAD (coronary artery disease), native coronary artery 04/2011   S/P CABG X4 Sx done in [place]   Carotid artery stenosis    1-39% bilateral by dopplers 2018   Chest wall pain    Right side, muscular,ortho,MRI. U/S abd ok. On lyrica   Chronic atrial fibrillation ([place])    s/p DCCV 01/2016, subsequent reversion, patient denied AAD   Chronic insomnia    CKD (chronic kidney disease), stage III (HCC)    Colon polyp 10/11   DDD (degenerative disc disease), lumbar    Dilatation of aorta (HCC)    aortic root is mildly enlarged at 36mm and moderately enlarged ascending aorta at 43mm by echo 03/2020    ED (erectile dysfunction)    GERD (gastroesophageal reflux disease)    Gout    History of kidney stones    History of migraine    Ischemic cardiomyopathy    EF 35-40% with moderate RV dysfunction by echo 03/2020 and unchanged from echo 2018   Kidney stone    uric acid, Dr [name]  renal insufficiency   Mitral regurgitation 06/04/2016   Moderate by echo 2020   Mixed hyperlipidemia 01/23/2013   Myocardial infarction ([place])    OSA on CPAP 07/16/2016   Osteoarthritis    of the knee   TIA (transient ischemic attack)    questionable Mini stroke per patient     Family History  Problem Relation Age of Onset   Alzheimer's disease Mother    Cancer Mother    CVA Father    Arrhythmia Father    Arrhythmia Sister      Social History:  reports that he has never smoked. He has never used smokeless tobacco. He reports that he does not currently use alcohol. He reports that he does not use drugs.   Exam: Current vital signs: BP (!) 139/106 (BP Location: Right Arm)    Pulse 68    Temp 97.8 F (36.6 C)    Resp 16    Ht 5\' 9"  (1.753 m)    Wt 87.5 kg  SpO2 97%    BMI 28.50 kg/m  Vital signs in last 24 hours: Temp:  [97.6 F (36.4 C)-98 F (36.7 C)] 97.8 F (36.6 C) (01/21 0345) Pulse Rate:  [57-92] 68 (01/21 0345) Resp:  [12-20] 16 (01/21 0345) BP: (122-170)/(82-113) 139/106 (01/21 0345) SpO2:  [91 %-98 %] 97 % (01/21 0345)   Physical Exam  Constitutional: Appears well-developed and well-nourished.  Psych: Affect appropriate to situation Eyes: No scleral injection HENT: No OP obstruction MSK: no joint deformities.  Cardiovascular: Normal rate and regular rhythm.  Respiratory: Effort normal, non-labored breathing GI: Soft.  No distension. There is no tenderness.  Skin: WDI  Neuro: Mental Status: Patient is awake, alert, oriented to person, place, month, year, and situation. Patient is able to give a clear and coherent history. No signs of aphasia or neglect Cranial  Nerves: II: Visual Fields are full. Pupils are equal, round, and reactive to light.   III,IV, VI: EOMI without ptosis or diploplia.  V: Facial sensation is symmetric to temperature VII: Facial movement is symmetric.  VIII: hearing is intact to voice X: Uvula elevates symmetrically XI: Shoulder shrug is symmetric. XII: tongue is midline without atrophy or fasciculations.  Motor: Tone is normal. Bulk is normal. 5/5 strength was present in all four extremities.  Sensory: Sensation is symmetric to light touch and temperature in the arms and legs. Cerebellar: FNF and HKS are intact bilaterally   I have reviewed labs in epic and the results pertinent to this consultation are: Creatinine 1.1  I have reviewed the images obtained: MRI brain-negative  Impression: 76 year old male with recurrent stereotyped episodes of visual change and garbled speech most consistent with complicated migraine.  Given the number that he has had with negative imaging, I think that a cerebrovascular cause is extremely unlikely.  One other consideration given that he did not have them younger would be simple partial seizure involving the occipital lobe.  Though I feel this is less likely, I do think an EEG is reasonable.  If this is negative then I would treat this as a migraine.  Nortriptyline 25 mg nightly could be helpful both for migraine as well as helping with sleep.  Recommendations: 1) EEG 2) if negative would discharge on nortriptyline 25 mg nightly with outpatient neurology follow-up 3) please call if any further questions or concerns remain.   Roland Rack, MD [facility] [phone]  If 7pm- 7am, please page neurology on call as listed in [place].

## 2021-01-21 NOTE — Progress Notes (Signed)
EEG done at bedside. No skin breakdown noted. Results pending. 

## 2021-01-21 NOTE — Procedures (Signed)
Routine EEG Report  Sean Willis is a 76 y.o. male with a history of spells who is undergoing an EEG to evaluate for seizures.  Report: This EEG was acquired with electrodes placed according to the International 10-20 electrode system (including Fp1, Fp2, F3, F4, C3, C4, P3, P4, O1, O2, T3, T4, T5, T6, A1, A2, Fz, Cz, Pz). The following electrodes were missing or displaced: none.  The occipital dominant rhythm was 9 Hz. This activity is reactive to stimulation. Drowsiness was manifested by background fragmentation; deeper stages of sleep were identified by K complexes and sleep spindles. There was no focal slowing. There were no interictal epileptiform discharges. There were no electrographic seizures identified. There was no abnormal response to photic stimulation or hyperventilation.   Impression: This EEG was obtained while awake and asleep and is normal.    Clinical Correlation: Normal EEGs, however, do not rule out epilepsy.  Su Monks, MD [facility] [phone]  If 7pm- 7am, please page neurology on call as listed in [place].

## 2021-01-21 NOTE — Progress Notes (Signed)
Patient ready for discharge to home; discharge instructions given and reviewed; Rx sent electronically. Patient ambulated independently within  the room and ambulated out for discharge;  accompanied by his wife to home.

## 2021-01-21 NOTE — Progress Notes (Signed)
Brief Neuro Update:  Routine EEG normal. No further inpatient neurological workup at this time. We will signoff. Please see earlier full consult note from Dr. Leonel Ramsay for full recommendations.  [place] Pager Number [phone]

## 2021-01-23 ENCOUNTER — Other Ambulatory Visit: Payer: Self-pay | Admitting: Cardiology

## 2021-01-23 LAB — HEMOGLOBIN A1C
Hgb A1c MFr Bld: 6.1 % — ABNORMAL HIGH (ref 4.8–5.6)
Mean Plasma Glucose: 128 mg/dL

## 2021-01-24 DIAGNOSIS — N400 Enlarged prostate without lower urinary tract symptoms: Secondary | ICD-10-CM | POA: Diagnosis not present

## 2021-01-24 DIAGNOSIS — G43009 Migraine without aura, not intractable, without status migrainosus: Secondary | ICD-10-CM | POA: Diagnosis not present

## 2021-01-24 DIAGNOSIS — G4700 Insomnia, unspecified: Secondary | ICD-10-CM | POA: Diagnosis not present

## 2021-01-24 DIAGNOSIS — I10 Essential (primary) hypertension: Secondary | ICD-10-CM | POA: Diagnosis not present

## 2021-01-26 ENCOUNTER — Other Ambulatory Visit: Payer: Medicare Other

## 2021-02-23 ENCOUNTER — Other Ambulatory Visit: Payer: Self-pay | Admitting: Internal Medicine

## 2021-02-23 DIAGNOSIS — I10 Essential (primary) hypertension: Secondary | ICD-10-CM | POA: Diagnosis not present

## 2021-02-23 DIAGNOSIS — I712 Thoracic aortic aneurysm, without rupture, unspecified: Secondary | ICD-10-CM | POA: Diagnosis not present

## 2021-02-23 DIAGNOSIS — I25810 Atherosclerosis of coronary artery bypass graft(s) without angina pectoris: Secondary | ICD-10-CM | POA: Diagnosis not present

## 2021-02-23 DIAGNOSIS — N183 Chronic kidney disease, stage 3 unspecified: Secondary | ICD-10-CM | POA: Diagnosis not present

## 2021-02-23 DIAGNOSIS — I4891 Unspecified atrial fibrillation: Secondary | ICD-10-CM | POA: Diagnosis not present

## 2021-02-23 DIAGNOSIS — G4700 Insomnia, unspecified: Secondary | ICD-10-CM | POA: Diagnosis not present

## 2021-02-24 ENCOUNTER — Other Ambulatory Visit: Payer: Self-pay | Admitting: Internal Medicine

## 2021-02-24 DIAGNOSIS — I711 Thoracic aortic aneurysm, ruptured, unspecified: Secondary | ICD-10-CM

## 2021-02-24 DIAGNOSIS — I712 Thoracic aortic aneurysm, without rupture, unspecified: Secondary | ICD-10-CM

## 2021-03-13 DIAGNOSIS — K8020 Calculus of gallbladder without cholecystitis without obstruction: Secondary | ICD-10-CM | POA: Diagnosis not present

## 2021-03-13 DIAGNOSIS — M4316 Spondylolisthesis, lumbar region: Secondary | ICD-10-CM | POA: Diagnosis not present

## 2021-03-13 DIAGNOSIS — N200 Calculus of kidney: Secondary | ICD-10-CM | POA: Diagnosis not present

## 2021-03-13 DIAGNOSIS — N281 Cyst of kidney, acquired: Secondary | ICD-10-CM | POA: Diagnosis not present

## 2021-03-20 DIAGNOSIS — R351 Nocturia: Secondary | ICD-10-CM | POA: Diagnosis not present

## 2021-03-20 DIAGNOSIS — N281 Cyst of kidney, acquired: Secondary | ICD-10-CM | POA: Diagnosis not present

## 2021-03-20 DIAGNOSIS — N401 Enlarged prostate with lower urinary tract symptoms: Secondary | ICD-10-CM | POA: Diagnosis not present

## 2021-03-20 DIAGNOSIS — N200 Calculus of kidney: Secondary | ICD-10-CM | POA: Diagnosis not present

## 2021-03-28 ENCOUNTER — Other Ambulatory Visit: Payer: Self-pay

## 2021-03-28 ENCOUNTER — Ambulatory Visit
Admission: RE | Admit: 2021-03-28 | Discharge: 2021-03-28 | Disposition: A | Discharge status: Home / Self Care | Payer: Medicare Other | Source: Ambulatory Visit | Attending: Internal Medicine | Admitting: Internal Medicine

## 2021-03-28 DIAGNOSIS — I712 Thoracic aortic aneurysm, without rupture, unspecified: Secondary | ICD-10-CM | POA: Diagnosis not present

## 2021-03-28 IMAGING — CT CT ANGIO CHEST
1 of 3 series · 12 of 32 positions shown · IV contrast (agent unspecified)
Comparison: [DATE]; [DATE]; [DATE]; [DATE];
[DATE]

CLINICAL DATA: Follow-up thoracic aortic aneurysm.

EXAM:
CT ANGIOGRAPHY CHEST WITH CONTRAST
TECHNIQUE: Multidetector CT imaging of the chest was performed using the
standard protocol during bolus administration of intravenous
contrast. Multiplanar CT image reconstructions and MIPs were
obtained to evaluate the vascular anatomy.

[Series 5: thins · axial · 0.83mm/px · z∈[+898,+1190]mm · 12 of 434 slices shown]
[im 34/434  lung]
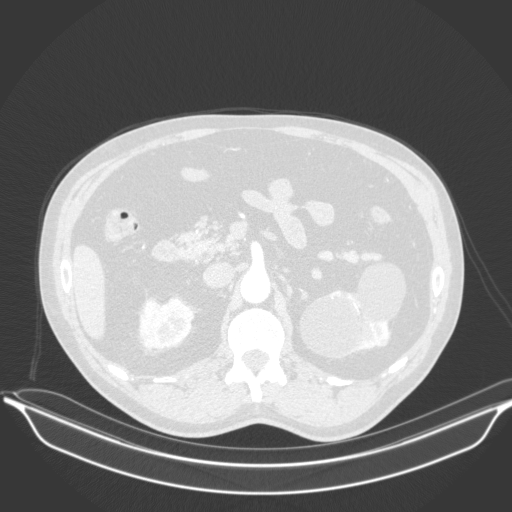
[im 67/434  soft-tissue]
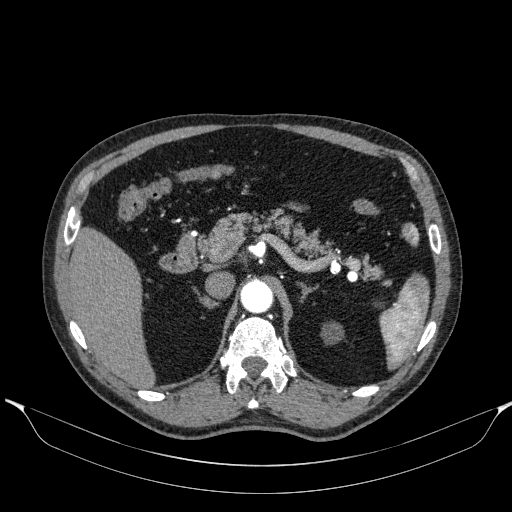
[im 100/434  lung]
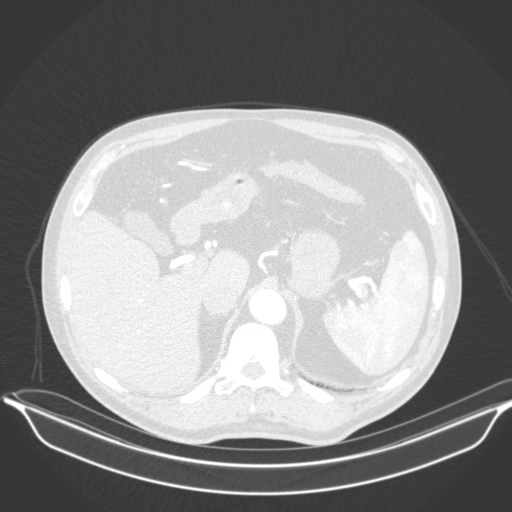
[im 134/434  soft-tissue]
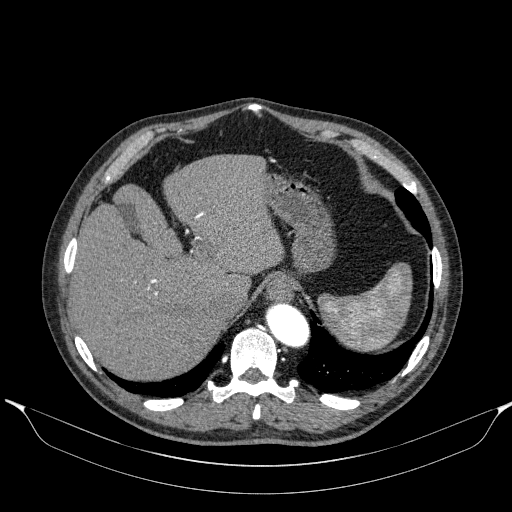
[im 167/434  lung]
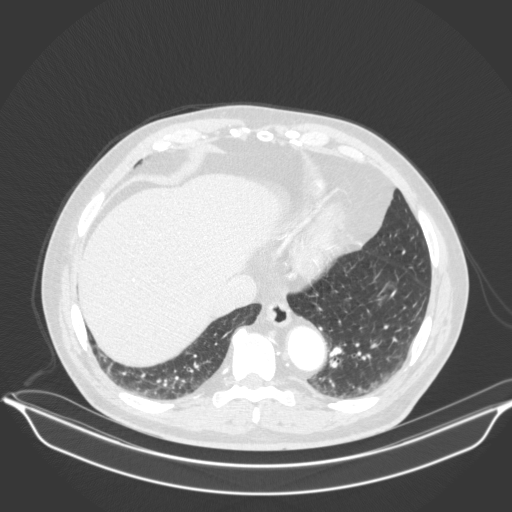
[im 200/434  soft-tissue]
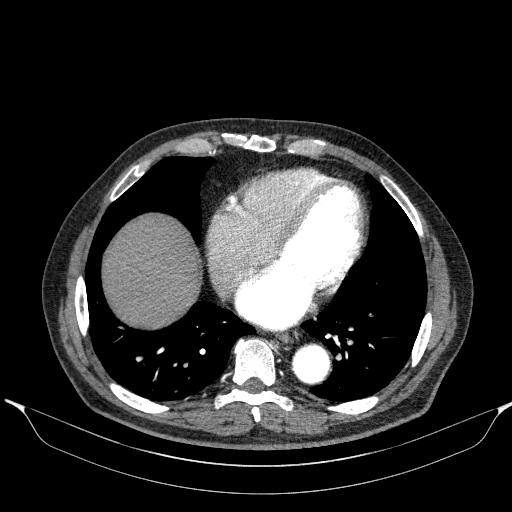
[im 234/434  lung]
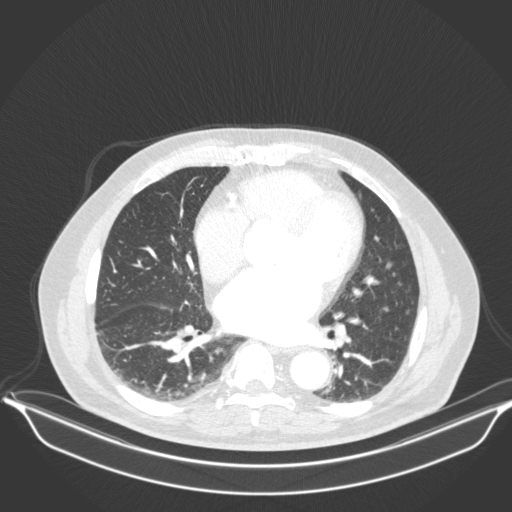
[im 267/434  soft-tissue]
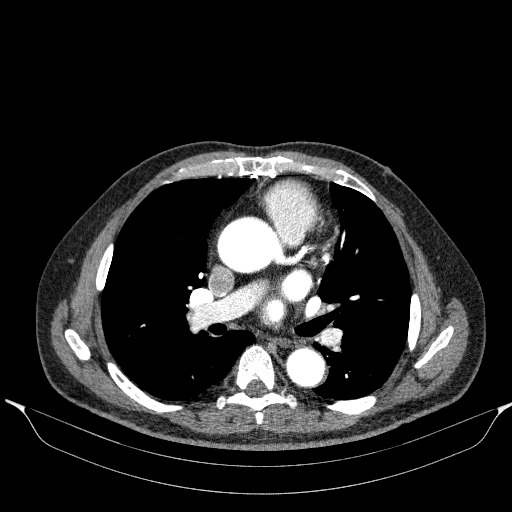
[im 300/434  lung]
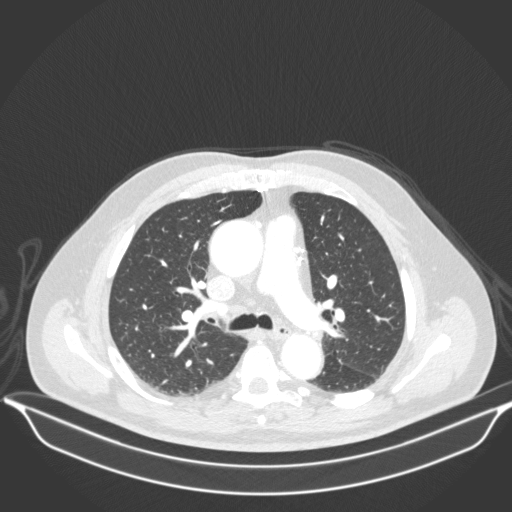
[im 334/434  soft-tissue]
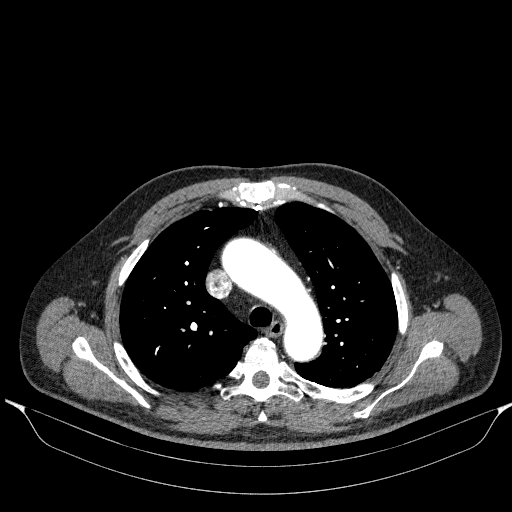
[im 367/434  lung]
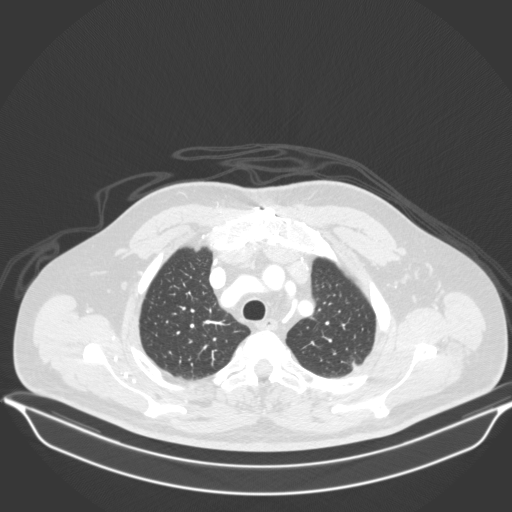
[im 400/434  soft-tissue]
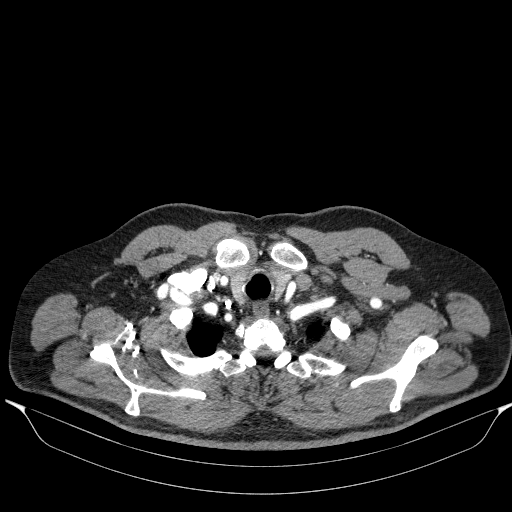

[12 of 32 positions shown; findings below may reference images not displayed]

RADIATION DOSE REDUCTION: This exam was performed according to the
departmental dose-optimization program which includes automated
exposure control, adjustment of the mA and/or kV according to
patient size and/or use of iterative reconstruction technique.

CONTRAST:  75mL [4K] IOPAMIDOL ([4K]) INJECTION 76%

Creatinine was obtained on site at [HOSPITAL] at [HOSPITAL].

Results: Creatinine 1.4 mg/dL.
FINDINGS: Vascular Findings:

Stable mild fusiform aneurysmal dilatation of the ascending thoracic
aorta with measurements as follows. The thoracic aorta tapers to a
normal caliber at the level of the aortic arch. No evidence of
thoracic aortic dissection or perivascular stranding on this
nongated examination. The descending thoracic aorta is of normal
caliber and widely patent without hemodynamically significant
narrowing.

The left vertebral artery is incidentally noted to arise directly
from the aortic arch. The branch vessels of the aortic arch appear
widely patent throughout their imaged courses.

Cardiomegaly. No pericardial effusion. Post median sternotomy and
CABG with calcifications within the native coronary arteries.

Although this examination was not tailored for the evaluation the
pulmonary arteries, there are no discrete filling defects within the
central pulmonary arterial tree to suggest central pulmonary
embolism. Normal caliber of the main pulmonary artery.

-------------------------------------------------------------

Thoracic aortic measurements:

SINOTUBULAR JUNCTION: 41 mm as measured in greatest oblique short
axis coronal dimension.

PROXIMAL ASCENDING THORACIC AORTA: 46 mm as measured in greatest
oblique short axis axial dimension (axial image 40, series 4) at the
level of the main pulmonary artery and approximately 44 mm as
measured in greatest oblique short axis coronal dimension (coronal
image 68, series 8), grossly unchanged compared to most remote chest
CT performed [DATE].

AORTIC ARCH: 31 mm as measured in greatest oblique short axis
sagittal dimension.

PROXIMAL DESCENDING THORACIC AORTA: 33 mm as measured in greatest
oblique short axis axial dimension at the level of the main
pulmonary artery.

DISTAL DESCENDING THORACIC AORTA: 27 mm as measured in greatest
oblique short axis axial dimension at the level of the diaphragmatic
hiatus.

Review of the MIP images confirms the above findings.

-------------------------------------------------------------

Non-Vascular Findings:

Mediastinum/Lymph Nodes: No bulky mediastinal, hilar or axillary
lymphadenopathy.

Lungs/Pleura: Minimal dependent subpleural ground-glass atelectasis.
No discrete focal airspace opacities. No pleural effusion or
pneumothorax. The central pulmonary airways appear widely patent. No
discrete pulmonary nodules.

Upper abdomen: Limited early arterial phase evaluation of the upper
abdomen demonstrates layering radiopaque gallstones within the neck
of an otherwise normal-appearing gallbladder (image 88, series 4).
Small hiatal hernia. Redemonstrated left-sided renal cysts with
dominant cyst measuring 5.1 cm (image 109, series 4).

Musculoskeletal: No acute or aggressive osseous abnormalities.
Stigmata of dish within the mid and caudal aspects of the thoracic
spine. DDD of T1-T2 with disc space height loss, endplate
irregularity and sclerosis. Mild bilateral gynecomastia.
IMPRESSION: 1. Stable uncomplicated fusiform aneurysmal dilatation of the
ascending thoracic aorta measuring 46 mm in diameter, grossly
unchanged compared to most remote examination performed in [4K].
Aortic aneurysm NOS ([4K]-[4K]).
2. Sequela of median sternotomy and CABG with calcifications within
the native coronary arteries.
3.  Aortic Atherosclerosis ([4K]-[4K]).

## 2021-03-28 MED ORDER — IOPAMIDOL (ISOVUE-370) INJECTION 76%
75.0000 mL | Freq: Once | INTRAVENOUS | Status: AC | PRN
  Administered 2021-03-28: 75 mL via INTRAVENOUS

## 2021-04-19 DIAGNOSIS — Z20822 Contact with and (suspected) exposure to covid-19: Secondary | ICD-10-CM | POA: Diagnosis not present

## 2021-04-19 DIAGNOSIS — H43813 Vitreous degeneration, bilateral: Secondary | ICD-10-CM | POA: Diagnosis not present

## 2021-04-26 DIAGNOSIS — G4700 Insomnia, unspecified: Secondary | ICD-10-CM | POA: Diagnosis not present

## 2021-04-26 DIAGNOSIS — I712 Thoracic aortic aneurysm, without rupture, unspecified: Secondary | ICD-10-CM | POA: Diagnosis not present

## 2021-04-26 DIAGNOSIS — R7303 Prediabetes: Secondary | ICD-10-CM | POA: Diagnosis not present

## 2021-04-26 DIAGNOSIS — D6869 Other thrombophilia: Secondary | ICD-10-CM | POA: Diagnosis not present

## 2021-04-26 DIAGNOSIS — N200 Calculus of kidney: Secondary | ICD-10-CM | POA: Diagnosis not present

## 2021-04-26 DIAGNOSIS — E782 Mixed hyperlipidemia: Secondary | ICD-10-CM | POA: Diagnosis not present

## 2021-04-26 DIAGNOSIS — I25810 Atherosclerosis of coronary artery bypass graft(s) without angina pectoris: Secondary | ICD-10-CM | POA: Diagnosis not present

## 2021-04-26 DIAGNOSIS — I4891 Unspecified atrial fibrillation: Secondary | ICD-10-CM | POA: Diagnosis not present

## 2021-04-26 DIAGNOSIS — Z125 Encounter for screening for malignant neoplasm of prostate: Secondary | ICD-10-CM | POA: Diagnosis not present

## 2021-04-26 DIAGNOSIS — Z0000 Encounter for general adult medical examination without abnormal findings: Secondary | ICD-10-CM | POA: Diagnosis not present

## 2021-04-26 DIAGNOSIS — I10 Essential (primary) hypertension: Secondary | ICD-10-CM | POA: Diagnosis not present

## 2021-04-26 DIAGNOSIS — M109 Gout, unspecified: Secondary | ICD-10-CM | POA: Diagnosis not present

## 2021-04-26 DIAGNOSIS — N1831 Chronic kidney disease, stage 3a: Secondary | ICD-10-CM | POA: Diagnosis not present

## 2021-04-28 ENCOUNTER — Other Ambulatory Visit: Payer: Self-pay | Admitting: Cardiology

## 2021-05-28 ENCOUNTER — Other Ambulatory Visit: Payer: Self-pay | Admitting: Cardiology

## 2021-05-29 ENCOUNTER — Other Ambulatory Visit: Payer: Self-pay | Admitting: Cardiology

## 2021-05-30 NOTE — Telephone Encounter (Signed)
Prescription refill request for Xarelto received.   Indication: afib  Last office visit: 01/19/2021, [place] Weight: 90.5 kg Age: 76 yo  Scr: 1.10, 11/21/2021 CrCl: 74 ml/min   Refill sent.

## 2021-06-05 ENCOUNTER — Other Ambulatory Visit: Payer: Self-pay | Admitting: Surgery

## 2021-06-05 DIAGNOSIS — I712 Thoracic aortic aneurysm, without rupture, unspecified: Secondary | ICD-10-CM

## 2021-07-08 DIAGNOSIS — M7021 Olecranon bursitis, right elbow: Secondary | ICD-10-CM | POA: Diagnosis not present

## 2021-08-09 ENCOUNTER — Encounter: Payer: Self-pay | Admitting: Surgery

## 2021-08-09 ENCOUNTER — Ambulatory Visit
Admission: RE | Admit: 2021-08-09 | Discharge: 2021-08-09 | Disposition: A | Discharge status: Home / Self Care | Payer: Medicare Other | Source: Ambulatory Visit | Attending: Surgery | Admitting: Surgery

## 2021-08-09 ENCOUNTER — Ambulatory Visit (INDEPENDENT_AMBULATORY_CARE_PROVIDER_SITE_OTHER): Payer: Medicare Other | Admitting: Surgery

## 2021-08-09 VITALS — BP 123/78 | HR 83 | Resp 20 | Ht 69.0 in | Wt 194.0 lb

## 2021-08-09 DIAGNOSIS — I255 Ischemic cardiomyopathy: Secondary | ICD-10-CM

## 2021-08-09 DIAGNOSIS — I712 Thoracic aortic aneurysm, without rupture, unspecified: Secondary | ICD-10-CM

## 2021-08-09 MED ORDER — IOPAMIDOL (ISOVUE-370) INJECTION 76%
75.0000 mL | Freq: Once | INTRAVENOUS | Status: AC | PRN
  Administered 2021-08-09: 75 mL via INTRAVENOUS

## 2021-08-09 NOTE — Progress Notes (Signed)
HPI:  Patient is a 76 year old gentleman who returns for follow-up of a stable 4.4 cm ascending aortic aneurysm that has been unchanged dating back to 2015. He suffered an acute MI in 04/2011 while on vacation in [place] and underwent PTCA of OM2 followed by CABG x 5. 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  There was also loss of an end to side anastomosis of the sequential graft to the obtuse marginal.  There were patent bypass grafts to the diagonal and intermediate and a widely patent but diffusely diseased RCA. The LAD was severely and diffusely diseased. With no anterior wall ischemia it was not felt that intervention on the LAD was necessary.  I last saw him in July 2021 and he has been doing well overall since.  Current Outpatient Medications  Medication Sig Dispense Refill   allopurinol (ZYLOPRIM) 100 MG tablet Take 100 mg by mouth daily.     Butalbital-APAP-Caffeine 50-325-40 MG capsule Take 1 capsule by mouth every 6 (six) hours as needed for migraine.     docusate sodium (COLACE) 100 MG capsule Take 1 capsule (100 mg total) by mouth daily as needed for up to 30 doses. (Patient taking differently: Take 100 mg by mouth daily as needed for mild constipation.) 30 capsule 0   escitalopram (LEXAPRO) 10 MG tablet Take 10 mg by mouth daily as needed (depression).     isosorbide mononitrate (IMDUR) 30 MG 24 hr tablet TAKE 1/2 OF A TABLET (15 MG TOTAL) BY MOUTH DAILY 45 tablet 3   losartan (COZAAR) 25 MG tablet Take 1 tablet (25 mg total) by mouth daily as needed (high blood pressure). (Patient taking differently: Take 25 mg by mouth daily.) 90 tablet 1   metoprolol tartrate (LOPRESSOR) 25 MG tablet Take 1 tablet (25 mg total) by mouth 2 (two) times daily. 60 tablet 0   mometasone (NASONEX) 50 MCG/ACT nasal spray Place 2 sprays into the nose daily as needed (congestion).     rosuvastatin  (CRESTOR) 20 MG tablet TAKE 1 TABLET BY MOUTH EVERY DAY 90 tablet 2   tamsulosin (FLOMAX) 0.4 MG CAPS capsule Take 0.4 mg by mouth daily.     VASCEPA 1 g capsule Take 2 capsules (2 g total) by mouth 2 (two) times daily. (Patient taking differently: Take 1 g by mouth daily.) 360 capsule 3   XARELTO 20 MG TABS tablet TAKE 1 TABLET BY MOUTH DAILY WITH SUPPER 90 tablet 1   esomeprazole (NEXIUM) 20 MG packet Take 20 mg by mouth daily before breakfast. (Patient not taking: Reported on 08/09/2021)     nitroGLYCERIN (NITROSTAT) 0.4 MG SL tablet Place 1 tablet (0.4 mg total) under the tongue every 5 (five) minutes as needed for chest pain. 25 tablet 3   zolpidem (AMBIEN) 5 MG tablet Take 5 mg by mouth at bedtime. (Patient not taking: Reported on 08/09/2021)  1   No current facility-administered medications for this visit.     Physical Exam: BP 123/78   Pulse 83   Resp 20   Ht '5\' 9"'$  (1.753 m)   Wt 194 lb (88 kg)   SpO2 93% Comment: RA  BMI 28.65 kg/m  He looks well. Cardiac exam shows a regular rate and rhythm with normal heart sounds.  There is no murmur. Lungs are clear.  Diagnostic Tests:  Narrative & Impression  CLINICAL DATA:  Thoracic aortic aneurysm.  EXAM: CT ANGIOGRAPHY CHEST WITH CONTRAST   TECHNIQUE: Multidetector CT imaging of the chest was performed using the standard protocol during bolus administration of intravenous contrast. Multiplanar CT image reconstructions and MIPs were obtained to evaluate the vascular anatomy.   RADIATION DOSE REDUCTION: This exam was performed according to the departmental dose-optimization program which includes automated exposure control, adjustment of the mA and/or kV according to patient size and/or use of iterative reconstruction technique.   CONTRAST:  31m ISOVUE-370 IOPAMIDOL (ISOVUE-370) INJECTION 76%   COMPARISON:  March 28, 2021.   FINDINGS: Cardiovascular: Grossly stable 4.6 cm ascending thoracic aortic aneurysm is noted. No  dissection is noted. Great vessels are widely patent. Normal cardiac size. No pericardial effusion. Status post coronary bypass graft.   Mediastinum/Nodes: No enlarged mediastinal, hilar, or axillary lymph nodes. Thyroid gland, trachea, and esophagus demonstrate no significant findings.   Lungs/Pleura: Lungs are clear. No pleural effusion or pneumothorax.   Upper Abdomen: No acute abnormality.   Musculoskeletal: No chest wall abnormality. No acute or significant osseous findings.   Review of the MIP images confirms the above findings.   IMPRESSION: Grossly stable 4.6 cm ascending thoracic aortic aneurysm. Recommend semi-annual imaging followup by CTA or MRA and referral to cardiothoracic surgery if not already obtained. This recommendation follows 2010 ACCF/AHA/AATS/ACR/ASA/SCA/SCAI/SIR/STS/SVM Guidelines for the Diagnosis and Management of Patients With Thoracic Aortic Disease. Circulation. 2010; 121:: M211-Z735 Aortic aneurysm NOS (ICD10-I71.9).   Status post coronary bypass graft.     Electronically Signed   By: JMarijo ConceptionM.D.   On: 08/09/2021 12:54      Impression:  This 76year old gentleman has a stable 4.6 cm fusiform ascending aortic aneurysm which is unchanged compared to CTA of the chest in March 2023.  There has been minimal change dating back to 2015.  This is still well below the surgical threshold of 5.5 cm.  Echocardiogram in January 2023 showed a normal trileaflet aortic valve without stenosis or insufficiency.  His ascending aorta was measured at 4.2 cm on that study.  Left ventricular ejection fraction was 40 to 45% with global hypokinesis.  I reviewed his CT scan images with him and answered all of his questions.  I stressed the importance of continued good blood pressure control in preventing further enlargement and acute aortic dissection.  I advised him against doing any heavy lifting that might require a Valsalva maneuver and could suddenly raise his  blood pressure to high levels.  Plan:  He will return to see me in 2 years with a CTA of the chest.  I spent 20 minutes performing this established patient evaluation and > 50% of this time was spent face to face counseling and coordinating the care of this patient's aortic aneurysm.    BGaye Pollack MD [facility] ([phone]

## 2021-09-05 ENCOUNTER — Other Ambulatory Visit: Payer: Self-pay | Admitting: Cardiology

## 2021-09-08 ENCOUNTER — Other Ambulatory Visit: Payer: Self-pay | Admitting: Cardiology

## 2021-09-08 MED ORDER — METOPROLOL TARTRATE 25 MG PO TABS: 25.0000 mg | ORAL_TABLET | Freq: Two times a day (BID) | ORAL | 4 refills | Status: DC

## 2021-09-10 ENCOUNTER — Other Ambulatory Visit: Payer: Self-pay | Admitting: Cardiology

## 2021-09-21 ENCOUNTER — Other Ambulatory Visit: Payer: Self-pay | Admitting: Cardiology

## 2021-09-27 DIAGNOSIS — I25810 Atherosclerosis of coronary artery bypass graft(s) without angina pectoris: Secondary | ICD-10-CM | POA: Diagnosis not present

## 2021-09-27 DIAGNOSIS — N1831 Chronic kidney disease, stage 3a: Secondary | ICD-10-CM | POA: Diagnosis not present

## 2021-09-27 DIAGNOSIS — K219 Gastro-esophageal reflux disease without esophagitis: Secondary | ICD-10-CM | POA: Diagnosis not present

## 2021-09-27 DIAGNOSIS — D6869 Other thrombophilia: Secondary | ICD-10-CM | POA: Diagnosis not present

## 2021-09-27 DIAGNOSIS — R7303 Prediabetes: Secondary | ICD-10-CM | POA: Diagnosis not present

## 2021-09-27 DIAGNOSIS — I4891 Unspecified atrial fibrillation: Secondary | ICD-10-CM | POA: Diagnosis not present

## 2021-09-27 DIAGNOSIS — E782 Mixed hyperlipidemia: Secondary | ICD-10-CM | POA: Diagnosis not present

## 2021-09-27 DIAGNOSIS — F411 Generalized anxiety disorder: Secondary | ICD-10-CM | POA: Diagnosis not present

## 2021-09-27 DIAGNOSIS — I712 Thoracic aortic aneurysm, without rupture, unspecified: Secondary | ICD-10-CM | POA: Diagnosis not present

## 2021-09-27 DIAGNOSIS — M109 Gout, unspecified: Secondary | ICD-10-CM | POA: Diagnosis not present

## 2021-09-27 DIAGNOSIS — Z23 Encounter for immunization: Secondary | ICD-10-CM | POA: Diagnosis not present

## 2021-09-27 DIAGNOSIS — I10 Essential (primary) hypertension: Secondary | ICD-10-CM | POA: Diagnosis not present

## 2021-10-20 ENCOUNTER — Other Ambulatory Visit: Payer: Self-pay | Admitting: Cardiology

## 2021-10-23 ENCOUNTER — Other Ambulatory Visit: Payer: Self-pay | Admitting: Cardiology

## 2022-01-24 ENCOUNTER — Other Ambulatory Visit: Payer: Self-pay | Admitting: Cardiology

## 2022-01-24 DIAGNOSIS — I482 Chronic atrial fibrillation, unspecified: Secondary | ICD-10-CM

## 2022-01-25 NOTE — Telephone Encounter (Signed)
Prescription refill request for Xarelto received.  Indication: Afib  Last office visit: 01/19/21 [place] [facility])  Weight: 88kg Age: 76 Scr: 1.10 (09/27/21 via [facility]) CrCl: 71.55m/min  Appropriate dose and refill sent to requested pharmacy.

## 2022-02-13 ENCOUNTER — Other Ambulatory Visit: Payer: Self-pay | Admitting: Cardiology

## 2022-03-12 ENCOUNTER — Other Ambulatory Visit: Payer: Self-pay | Admitting: Cardiology

## 2022-03-22 DIAGNOSIS — N200 Calculus of kidney: Secondary | ICD-10-CM | POA: Diagnosis not present

## 2022-03-22 DIAGNOSIS — N401 Enlarged prostate with lower urinary tract symptoms: Secondary | ICD-10-CM | POA: Diagnosis not present

## 2022-03-22 DIAGNOSIS — R351 Nocturia: Secondary | ICD-10-CM | POA: Diagnosis not present

## 2022-03-22 DIAGNOSIS — N281 Cyst of kidney, acquired: Secondary | ICD-10-CM | POA: Diagnosis not present

## 2022-04-09 DIAGNOSIS — T886XXA Anaphylactic reaction due to adverse effect of correct drug or medicament properly administered, initial encounter: Secondary | ICD-10-CM | POA: Diagnosis not present

## 2022-04-09 DIAGNOSIS — G919 Hydrocephalus, unspecified: Secondary | ICD-10-CM | POA: Diagnosis not present

## 2022-04-09 DIAGNOSIS — R4182 Altered mental status, unspecified: Secondary | ICD-10-CM | POA: Diagnosis not present

## 2022-04-09 DIAGNOSIS — R410 Disorientation, unspecified: Secondary | ICD-10-CM | POA: Diagnosis not present

## 2022-04-09 DIAGNOSIS — N183 Chronic kidney disease, stage 3 unspecified: Secondary | ICD-10-CM | POA: Diagnosis present

## 2022-04-09 DIAGNOSIS — G4733 Obstructive sleep apnea (adult) (pediatric): Secondary | ICD-10-CM | POA: Diagnosis not present

## 2022-04-09 DIAGNOSIS — D6832 Hemorrhagic disorder due to extrinsic circulating anticoagulants: Secondary | ICD-10-CM | POA: Diagnosis not present

## 2022-04-09 DIAGNOSIS — I252 Old myocardial infarction: Secondary | ICD-10-CM | POA: Diagnosis not present

## 2022-04-09 DIAGNOSIS — I10 Essential (primary) hypertension: Secondary | ICD-10-CM | POA: Diagnosis not present

## 2022-04-09 DIAGNOSIS — G936 Cerebral edema: Secondary | ICD-10-CM | POA: Diagnosis not present

## 2022-04-09 DIAGNOSIS — R112 Nausea with vomiting, unspecified: Secondary | ICD-10-CM | POA: Diagnosis not present

## 2022-04-09 DIAGNOSIS — I161 Hypertensive emergency: Secondary | ICD-10-CM | POA: Diagnosis not present

## 2022-04-09 DIAGNOSIS — Z982 Presence of cerebrospinal fluid drainage device: Secondary | ICD-10-CM | POA: Diagnosis not present

## 2022-04-09 DIAGNOSIS — E785 Hyperlipidemia, unspecified: Secondary | ICD-10-CM | POA: Diagnosis not present

## 2022-04-09 DIAGNOSIS — R519 Headache, unspecified: Secondary | ICD-10-CM | POA: Diagnosis not present

## 2022-04-09 DIAGNOSIS — F419 Anxiety disorder, unspecified: Secondary | ICD-10-CM | POA: Diagnosis present

## 2022-04-09 DIAGNOSIS — J9811 Atelectasis: Secondary | ICD-10-CM | POA: Diagnosis not present

## 2022-04-09 DIAGNOSIS — I480 Paroxysmal atrial fibrillation: Secondary | ICD-10-CM | POA: Diagnosis not present

## 2022-04-09 DIAGNOSIS — R52 Pain, unspecified: Secondary | ICD-10-CM | POA: Diagnosis not present

## 2022-04-09 DIAGNOSIS — G43909 Migraine, unspecified, not intractable, without status migrainosus: Secondary | ICD-10-CM | POA: Diagnosis not present

## 2022-04-09 DIAGNOSIS — G911 Obstructive hydrocephalus: Secondary | ICD-10-CM | POA: Diagnosis not present

## 2022-04-09 DIAGNOSIS — R9431 Abnormal electrocardiogram [ECG] [EKG]: Secondary | ICD-10-CM | POA: Diagnosis not present

## 2022-04-09 DIAGNOSIS — G4489 Other headache syndrome: Secondary | ICD-10-CM | POA: Diagnosis not present

## 2022-04-09 DIAGNOSIS — Z4682 Encounter for fitting and adjustment of non-vascular catheter: Secondary | ICD-10-CM | POA: Diagnosis not present

## 2022-04-09 DIAGNOSIS — I614 Nontraumatic intracerebral hemorrhage in cerebellum: Secondary | ICD-10-CM | POA: Diagnosis not present

## 2022-04-09 DIAGNOSIS — S06370A Contusion, laceration, and hemorrhage of cerebellum without loss of consciousness, initial encounter: Secondary | ICD-10-CM | POA: Diagnosis not present

## 2022-04-09 DIAGNOSIS — R0689 Other abnormalities of breathing: Secondary | ICD-10-CM | POA: Diagnosis not present

## 2022-04-09 DIAGNOSIS — M109 Gout, unspecified: Secondary | ICD-10-CM | POA: Diagnosis not present

## 2022-04-09 DIAGNOSIS — R29702 NIHSS score 2: Secondary | ICD-10-CM | POA: Diagnosis not present

## 2022-04-09 DIAGNOSIS — R42 Dizziness and giddiness: Secondary | ICD-10-CM | POA: Diagnosis not present

## 2022-04-09 DIAGNOSIS — R0902 Hypoxemia: Secondary | ICD-10-CM | POA: Diagnosis not present

## 2022-04-09 DIAGNOSIS — I6200 Nontraumatic subdural hemorrhage, unspecified: Secondary | ICD-10-CM | POA: Diagnosis not present

## 2022-04-09 DIAGNOSIS — F418 Other specified anxiety disorders: Secondary | ICD-10-CM | POA: Diagnosis not present

## 2022-04-09 DIAGNOSIS — J90 Pleural effusion, not elsewhere classified: Secondary | ICD-10-CM | POA: Diagnosis not present

## 2022-04-09 DIAGNOSIS — R918 Other nonspecific abnormal finding of lung field: Secondary | ICD-10-CM | POA: Diagnosis not present

## 2022-04-09 DIAGNOSIS — D696 Thrombocytopenia, unspecified: Secondary | ICD-10-CM | POA: Diagnosis not present

## 2022-04-09 DIAGNOSIS — I959 Hypotension, unspecified: Secondary | ICD-10-CM | POA: Diagnosis not present

## 2022-04-09 DIAGNOSIS — G9389 Other specified disorders of brain: Secondary | ICD-10-CM | POA: Diagnosis not present

## 2022-04-09 DIAGNOSIS — I611 Nontraumatic intracerebral hemorrhage in hemisphere, cortical: Secondary | ICD-10-CM | POA: Diagnosis not present

## 2022-04-09 DIAGNOSIS — R4781 Slurred speech: Secondary | ICD-10-CM | POA: Diagnosis present

## 2022-04-09 DIAGNOSIS — Z452 Encounter for adjustment and management of vascular access device: Secondary | ICD-10-CM | POA: Diagnosis not present

## 2022-04-09 DIAGNOSIS — G935 Compression of brain: Secondary | ICD-10-CM | POA: Diagnosis not present

## 2022-04-09 DIAGNOSIS — I615 Nontraumatic intracerebral hemorrhage, intraventricular: Secondary | ICD-10-CM | POA: Diagnosis not present

## 2022-04-09 DIAGNOSIS — I517 Cardiomegaly: Secondary | ICD-10-CM | POA: Diagnosis not present

## 2022-04-09 DIAGNOSIS — S066X0A Traumatic subarachnoid hemorrhage without loss of consciousness, initial encounter: Secondary | ICD-10-CM | POA: Diagnosis not present

## 2022-04-09 DIAGNOSIS — S0012XA Contusion of left eyelid and periocular area, initial encounter: Secondary | ICD-10-CM | POA: Diagnosis not present

## 2022-04-09 DIAGNOSIS — I2510 Atherosclerotic heart disease of native coronary artery without angina pectoris: Secondary | ICD-10-CM | POA: Diagnosis not present

## 2022-04-09 DIAGNOSIS — Z9911 Dependence on respirator [ventilator] status: Secondary | ICD-10-CM | POA: Diagnosis not present

## 2022-04-09 DIAGNOSIS — R140 Abdominal distension (gaseous): Secondary | ICD-10-CM | POA: Diagnosis not present

## 2022-04-09 DIAGNOSIS — K1370 Unspecified lesions of oral mucosa: Secondary | ICD-10-CM | POA: Diagnosis not present

## 2022-04-10 DIAGNOSIS — I480 Paroxysmal atrial fibrillation: Secondary | ICD-10-CM | POA: Diagnosis not present

## 2022-04-10 DIAGNOSIS — R0689 Other abnormalities of breathing: Secondary | ICD-10-CM | POA: Diagnosis not present

## 2022-04-10 DIAGNOSIS — I611 Nontraumatic intracerebral hemorrhage in hemisphere, cortical: Secondary | ICD-10-CM | POA: Diagnosis not present

## 2022-04-10 DIAGNOSIS — I161 Hypertensive emergency: Secondary | ICD-10-CM | POA: Diagnosis not present

## 2022-04-10 DIAGNOSIS — N400 Enlarged prostate without lower urinary tract symptoms: Secondary | ICD-10-CM | POA: Insufficient documentation

## 2022-04-10 DIAGNOSIS — R7303 Prediabetes: Secondary | ICD-10-CM | POA: Insufficient documentation

## 2022-04-10 DIAGNOSIS — I615 Nontraumatic intracerebral hemorrhage, intraventricular: Secondary | ICD-10-CM | POA: Diagnosis not present

## 2022-04-10 DIAGNOSIS — D6832 Hemorrhagic disorder due to extrinsic circulating anticoagulants: Secondary | ICD-10-CM | POA: Diagnosis not present

## 2022-04-10 DIAGNOSIS — J449 Chronic obstructive pulmonary disease, unspecified: Secondary | ICD-10-CM | POA: Insufficient documentation

## 2022-04-10 DIAGNOSIS — G43909 Migraine, unspecified, not intractable, without status migrainosus: Secondary | ICD-10-CM | POA: Insufficient documentation

## 2022-04-10 DIAGNOSIS — I712 Thoracic aortic aneurysm, without rupture, unspecified: Secondary | ICD-10-CM | POA: Insufficient documentation

## 2022-04-10 DIAGNOSIS — R918 Other nonspecific abnormal finding of lung field: Secondary | ICD-10-CM | POA: Diagnosis not present

## 2022-04-10 DIAGNOSIS — M109 Gout, unspecified: Secondary | ICD-10-CM | POA: Insufficient documentation

## 2022-04-10 DIAGNOSIS — S06370A Contusion, laceration, and hemorrhage of cerebellum without loss of consciousness, initial encounter: Secondary | ICD-10-CM | POA: Diagnosis not present

## 2022-04-10 DIAGNOSIS — I10 Essential (primary) hypertension: Secondary | ICD-10-CM | POA: Insufficient documentation

## 2022-04-10 DIAGNOSIS — D696 Thrombocytopenia, unspecified: Secondary | ICD-10-CM | POA: Diagnosis not present

## 2022-04-10 DIAGNOSIS — K219 Gastro-esophageal reflux disease without esophagitis: Secondary | ICD-10-CM | POA: Insufficient documentation

## 2022-04-10 DIAGNOSIS — G911 Obstructive hydrocephalus: Secondary | ICD-10-CM | POA: Diagnosis not present

## 2022-04-11 DIAGNOSIS — I615 Nontraumatic intracerebral hemorrhage, intraventricular: Secondary | ICD-10-CM | POA: Diagnosis not present

## 2022-04-11 DIAGNOSIS — G9389 Other specified disorders of brain: Secondary | ICD-10-CM | POA: Diagnosis not present

## 2022-04-11 DIAGNOSIS — Z452 Encounter for adjustment and management of vascular access device: Secondary | ICD-10-CM | POA: Diagnosis not present

## 2022-04-11 DIAGNOSIS — R9431 Abnormal electrocardiogram [ECG] [EKG]: Secondary | ICD-10-CM | POA: Diagnosis not present

## 2022-04-11 DIAGNOSIS — Z9911 Dependence on respirator [ventilator] status: Secondary | ICD-10-CM | POA: Diagnosis not present

## 2022-04-11 DIAGNOSIS — I6200 Nontraumatic subdural hemorrhage, unspecified: Secondary | ICD-10-CM | POA: Diagnosis not present

## 2022-04-11 DIAGNOSIS — R918 Other nonspecific abnormal finding of lung field: Secondary | ICD-10-CM | POA: Diagnosis not present

## 2022-04-11 DIAGNOSIS — Z4682 Encounter for fitting and adjustment of non-vascular catheter: Secondary | ICD-10-CM | POA: Diagnosis not present

## 2022-04-11 DIAGNOSIS — R140 Abdominal distension (gaseous): Secondary | ICD-10-CM | POA: Diagnosis not present

## 2022-04-11 DIAGNOSIS — Z982 Presence of cerebrospinal fluid drainage device: Secondary | ICD-10-CM | POA: Diagnosis not present

## 2022-04-12 DIAGNOSIS — I161 Hypertensive emergency: Secondary | ICD-10-CM | POA: Diagnosis not present

## 2022-04-12 DIAGNOSIS — I615 Nontraumatic intracerebral hemorrhage, intraventricular: Secondary | ICD-10-CM | POA: Diagnosis not present

## 2022-04-12 DIAGNOSIS — G9389 Other specified disorders of brain: Secondary | ICD-10-CM | POA: Diagnosis not present

## 2022-04-12 DIAGNOSIS — I517 Cardiomegaly: Secondary | ICD-10-CM | POA: Diagnosis not present

## 2022-04-12 DIAGNOSIS — G911 Obstructive hydrocephalus: Secondary | ICD-10-CM | POA: Diagnosis not present

## 2022-04-12 DIAGNOSIS — I480 Paroxysmal atrial fibrillation: Secondary | ICD-10-CM | POA: Diagnosis not present

## 2022-04-12 DIAGNOSIS — I614 Nontraumatic intracerebral hemorrhage in cerebellum: Secondary | ICD-10-CM | POA: Diagnosis not present

## 2022-04-13 DIAGNOSIS — J9811 Atelectasis: Secondary | ICD-10-CM | POA: Diagnosis not present

## 2022-04-13 DIAGNOSIS — I615 Nontraumatic intracerebral hemorrhage, intraventricular: Secondary | ICD-10-CM | POA: Diagnosis not present

## 2022-04-13 DIAGNOSIS — G911 Obstructive hydrocephalus: Secondary | ICD-10-CM | POA: Diagnosis not present

## 2022-04-13 DIAGNOSIS — J90 Pleural effusion, not elsewhere classified: Secondary | ICD-10-CM | POA: Diagnosis not present

## 2022-04-13 DIAGNOSIS — I161 Hypertensive emergency: Secondary | ICD-10-CM | POA: Diagnosis not present

## 2022-04-13 DIAGNOSIS — I614 Nontraumatic intracerebral hemorrhage in cerebellum: Secondary | ICD-10-CM | POA: Diagnosis not present

## 2022-04-13 DIAGNOSIS — R0902 Hypoxemia: Secondary | ICD-10-CM | POA: Diagnosis not present

## 2022-04-13 DIAGNOSIS — I480 Paroxysmal atrial fibrillation: Secondary | ICD-10-CM | POA: Diagnosis not present

## 2022-04-14 ENCOUNTER — Other Ambulatory Visit: Payer: Self-pay | Admitting: Cardiology

## 2022-04-14 DIAGNOSIS — R0902 Hypoxemia: Secondary | ICD-10-CM | POA: Diagnosis not present

## 2022-04-14 DIAGNOSIS — J9811 Atelectasis: Secondary | ICD-10-CM | POA: Diagnosis not present

## 2022-04-14 DIAGNOSIS — R918 Other nonspecific abnormal finding of lung field: Secondary | ICD-10-CM | POA: Diagnosis not present

## 2022-04-17 DIAGNOSIS — I6200 Nontraumatic subdural hemorrhage, unspecified: Secondary | ICD-10-CM | POA: Diagnosis not present

## 2022-04-17 DIAGNOSIS — I614 Nontraumatic intracerebral hemorrhage in cerebellum: Secondary | ICD-10-CM | POA: Diagnosis not present

## 2022-04-17 DIAGNOSIS — I615 Nontraumatic intracerebral hemorrhage, intraventricular: Secondary | ICD-10-CM | POA: Diagnosis not present

## 2022-04-20 DIAGNOSIS — I614 Nontraumatic intracerebral hemorrhage in cerebellum: Secondary | ICD-10-CM | POA: Diagnosis not present

## 2022-04-20 DIAGNOSIS — I615 Nontraumatic intracerebral hemorrhage, intraventricular: Secondary | ICD-10-CM | POA: Diagnosis not present

## 2022-04-23 DIAGNOSIS — Z7901 Long term (current) use of anticoagulants: Secondary | ICD-10-CM | POA: Diagnosis not present

## 2022-04-23 DIAGNOSIS — I2510 Atherosclerotic heart disease of native coronary artery without angina pectoris: Secondary | ICD-10-CM | POA: Diagnosis not present

## 2022-04-23 DIAGNOSIS — K219 Gastro-esophageal reflux disease without esophagitis: Secondary | ICD-10-CM | POA: Diagnosis not present

## 2022-04-23 DIAGNOSIS — R338 Other retention of urine: Secondary | ICD-10-CM | POA: Diagnosis not present

## 2022-04-23 DIAGNOSIS — R2689 Other abnormalities of gait and mobility: Secondary | ICD-10-CM | POA: Diagnosis not present

## 2022-04-23 DIAGNOSIS — R29702 NIHSS score 2: Secondary | ICD-10-CM | POA: Diagnosis not present

## 2022-04-23 DIAGNOSIS — N183 Chronic kidney disease, stage 3 unspecified: Secondary | ICD-10-CM | POA: Diagnosis not present

## 2022-04-23 DIAGNOSIS — I712 Thoracic aortic aneurysm, without rupture, unspecified: Secondary | ICD-10-CM | POA: Diagnosis not present

## 2022-04-23 DIAGNOSIS — G43B0 Ophthalmoplegic migraine, not intractable: Secondary | ICD-10-CM | POA: Diagnosis not present

## 2022-04-23 DIAGNOSIS — I951 Orthostatic hypotension: Secondary | ICD-10-CM | POA: Diagnosis not present

## 2022-04-23 DIAGNOSIS — M6281 Muscle weakness (generalized): Secondary | ICD-10-CM | POA: Diagnosis not present

## 2022-04-23 DIAGNOSIS — M109 Gout, unspecified: Secondary | ICD-10-CM | POA: Diagnosis not present

## 2022-04-23 DIAGNOSIS — G936 Cerebral edema: Secondary | ICD-10-CM | POA: Diagnosis not present

## 2022-04-23 DIAGNOSIS — I130 Hypertensive heart and chronic kidney disease with heart failure and stage 1 through stage 4 chronic kidney disease, or unspecified chronic kidney disease: Secondary | ICD-10-CM | POA: Diagnosis not present

## 2022-04-23 DIAGNOSIS — F32A Depression, unspecified: Secondary | ICD-10-CM | POA: Diagnosis not present

## 2022-04-23 DIAGNOSIS — N401 Enlarged prostate with lower urinary tract symptoms: Secondary | ICD-10-CM | POA: Diagnosis not present

## 2022-04-23 DIAGNOSIS — I615 Nontraumatic intracerebral hemorrhage, intraventricular: Secondary | ICD-10-CM | POA: Diagnosis not present

## 2022-04-23 DIAGNOSIS — I614 Nontraumatic intracerebral hemorrhage in cerebellum: Secondary | ICD-10-CM | POA: Diagnosis not present

## 2022-04-23 DIAGNOSIS — Z9181 History of falling: Secondary | ICD-10-CM | POA: Diagnosis not present

## 2022-04-23 DIAGNOSIS — F99 Mental disorder, not otherwise specified: Secondary | ICD-10-CM | POA: Diagnosis not present

## 2022-04-23 DIAGNOSIS — D72829 Elevated white blood cell count, unspecified: Secondary | ICD-10-CM | POA: Diagnosis not present

## 2022-04-23 DIAGNOSIS — R42 Dizziness and giddiness: Secondary | ICD-10-CM | POA: Diagnosis not present

## 2022-04-23 DIAGNOSIS — R279 Unspecified lack of coordination: Secondary | ICD-10-CM | POA: Diagnosis not present

## 2022-04-23 DIAGNOSIS — E785 Hyperlipidemia, unspecified: Secondary | ICD-10-CM | POA: Diagnosis not present

## 2022-04-23 DIAGNOSIS — B9620 Unspecified Escherichia coli [E. coli] as the cause of diseases classified elsewhere: Secondary | ICD-10-CM | POA: Diagnosis not present

## 2022-04-23 DIAGNOSIS — G911 Obstructive hydrocephalus: Secondary | ICD-10-CM | POA: Diagnosis not present

## 2022-04-23 DIAGNOSIS — Z466 Encounter for fitting and adjustment of urinary device: Secondary | ICD-10-CM | POA: Diagnosis not present

## 2022-04-23 DIAGNOSIS — I69198 Other sequelae of nontraumatic intracerebral hemorrhage: Secondary | ICD-10-CM | POA: Diagnosis not present

## 2022-04-23 DIAGNOSIS — I129 Hypertensive chronic kidney disease with stage 1 through stage 4 chronic kidney disease, or unspecified chronic kidney disease: Secondary | ICD-10-CM | POA: Diagnosis not present

## 2022-04-23 DIAGNOSIS — T886XXA Anaphylactic reaction due to adverse effect of correct drug or medicament properly administered, initial encounter: Secondary | ICD-10-CM | POA: Diagnosis not present

## 2022-04-23 DIAGNOSIS — R410 Disorientation, unspecified: Secondary | ICD-10-CM | POA: Diagnosis not present

## 2022-04-23 DIAGNOSIS — R339 Retention of urine, unspecified: Secondary | ICD-10-CM | POA: Diagnosis not present

## 2022-04-23 DIAGNOSIS — I480 Paroxysmal atrial fibrillation: Secondary | ICD-10-CM | POA: Diagnosis not present

## 2022-04-23 DIAGNOSIS — G935 Compression of brain: Secondary | ICD-10-CM | POA: Diagnosis not present

## 2022-04-23 DIAGNOSIS — N390 Urinary tract infection, site not specified: Secondary | ICD-10-CM | POA: Diagnosis not present

## 2022-04-23 DIAGNOSIS — Z951 Presence of aortocoronary bypass graft: Secondary | ICD-10-CM | POA: Diagnosis not present

## 2022-04-23 DIAGNOSIS — I629 Nontraumatic intracranial hemorrhage, unspecified: Secondary | ICD-10-CM | POA: Diagnosis not present

## 2022-04-23 DIAGNOSIS — F419 Anxiety disorder, unspecified: Secondary | ICD-10-CM | POA: Diagnosis not present

## 2022-04-23 DIAGNOSIS — N400 Enlarged prostate without lower urinary tract symptoms: Secondary | ICD-10-CM | POA: Diagnosis not present

## 2022-04-23 DIAGNOSIS — R519 Headache, unspecified: Secondary | ICD-10-CM | POA: Diagnosis not present

## 2022-04-23 DIAGNOSIS — R5381 Other malaise: Secondary | ICD-10-CM | POA: Diagnosis not present

## 2022-04-23 DIAGNOSIS — I5022 Chronic systolic (congestive) heart failure: Secondary | ICD-10-CM | POA: Diagnosis not present

## 2022-04-23 DIAGNOSIS — G918 Other hydrocephalus: Secondary | ICD-10-CM | POA: Diagnosis not present

## 2022-04-29 ENCOUNTER — Other Ambulatory Visit: Payer: Self-pay | Admitting: Cardiology

## 2022-05-04 DIAGNOSIS — I615 Nontraumatic intracerebral hemorrhage, intraventricular: Secondary | ICD-10-CM | POA: Diagnosis not present

## 2022-05-04 DIAGNOSIS — J449 Chronic obstructive pulmonary disease, unspecified: Secondary | ICD-10-CM | POA: Diagnosis not present

## 2022-05-04 DIAGNOSIS — G43909 Migraine, unspecified, not intractable, without status migrainosus: Secondary | ICD-10-CM | POA: Diagnosis not present

## 2022-05-04 DIAGNOSIS — I69198 Other sequelae of nontraumatic intracerebral hemorrhage: Secondary | ICD-10-CM | POA: Diagnosis not present

## 2022-05-04 DIAGNOSIS — R2689 Other abnormalities of gait and mobility: Secondary | ICD-10-CM | POA: Diagnosis not present

## 2022-05-04 DIAGNOSIS — N401 Enlarged prostate with lower urinary tract symptoms: Secondary | ICD-10-CM | POA: Diagnosis not present

## 2022-05-04 DIAGNOSIS — I10 Essential (primary) hypertension: Secondary | ICD-10-CM | POA: Diagnosis not present

## 2022-05-04 DIAGNOSIS — I4819 Other persistent atrial fibrillation: Secondary | ICD-10-CM | POA: Diagnosis not present

## 2022-05-04 DIAGNOSIS — K219 Gastro-esophageal reflux disease without esophagitis: Secondary | ICD-10-CM | POA: Diagnosis not present

## 2022-05-04 DIAGNOSIS — Z8679 Personal history of other diseases of the circulatory system: Secondary | ICD-10-CM | POA: Diagnosis not present

## 2022-05-04 DIAGNOSIS — F419 Anxiety disorder, unspecified: Secondary | ICD-10-CM | POA: Diagnosis not present

## 2022-05-04 DIAGNOSIS — I2510 Atherosclerotic heart disease of native coronary artery without angina pectoris: Secondary | ICD-10-CM | POA: Diagnosis not present

## 2022-05-04 DIAGNOSIS — N400 Enlarged prostate without lower urinary tract symptoms: Secondary | ICD-10-CM | POA: Diagnosis not present

## 2022-05-04 DIAGNOSIS — N200 Calculus of kidney: Secondary | ICD-10-CM | POA: Diagnosis not present

## 2022-05-04 DIAGNOSIS — G43B0 Ophthalmoplegic migraine, not intractable: Secondary | ICD-10-CM | POA: Diagnosis not present

## 2022-05-04 DIAGNOSIS — I614 Nontraumatic intracerebral hemorrhage in cerebellum: Secondary | ICD-10-CM | POA: Diagnosis not present

## 2022-05-04 DIAGNOSIS — G918 Other hydrocephalus: Secondary | ICD-10-CM | POA: Diagnosis not present

## 2022-05-04 DIAGNOSIS — I480 Paroxysmal atrial fibrillation: Secondary | ICD-10-CM | POA: Diagnosis not present

## 2022-05-04 DIAGNOSIS — R5381 Other malaise: Secondary | ICD-10-CM | POA: Diagnosis not present

## 2022-05-04 DIAGNOSIS — N1831 Chronic kidney disease, stage 3a: Secondary | ICD-10-CM | POA: Diagnosis not present

## 2022-05-04 DIAGNOSIS — M6281 Muscle weakness (generalized): Secondary | ICD-10-CM | POA: Diagnosis not present

## 2022-05-04 DIAGNOSIS — N183 Chronic kidney disease, stage 3 unspecified: Secondary | ICD-10-CM | POA: Diagnosis not present

## 2022-05-04 DIAGNOSIS — M109 Gout, unspecified: Secondary | ICD-10-CM | POA: Diagnosis not present

## 2022-05-04 DIAGNOSIS — Z466 Encounter for fitting and adjustment of urinary device: Secondary | ICD-10-CM | POA: Diagnosis not present

## 2022-05-04 DIAGNOSIS — R338 Other retention of urine: Secondary | ICD-10-CM | POA: Diagnosis not present

## 2022-05-04 DIAGNOSIS — N390 Urinary tract infection, site not specified: Secondary | ICD-10-CM | POA: Diagnosis not present

## 2022-05-04 DIAGNOSIS — I712 Thoracic aortic aneurysm, without rupture, unspecified: Secondary | ICD-10-CM | POA: Diagnosis not present

## 2022-05-04 DIAGNOSIS — I5022 Chronic systolic (congestive) heart failure: Secondary | ICD-10-CM | POA: Diagnosis not present

## 2022-05-04 DIAGNOSIS — N281 Cyst of kidney, acquired: Secondary | ICD-10-CM | POA: Diagnosis not present

## 2022-05-04 DIAGNOSIS — G4733 Obstructive sleep apnea (adult) (pediatric): Secondary | ICD-10-CM | POA: Diagnosis not present

## 2022-05-04 DIAGNOSIS — B9620 Unspecified Escherichia coli [E. coli] as the cause of diseases classified elsewhere: Secondary | ICD-10-CM | POA: Diagnosis not present

## 2022-05-04 DIAGNOSIS — Z9181 History of falling: Secondary | ICD-10-CM | POA: Diagnosis not present

## 2022-05-04 DIAGNOSIS — F32A Depression, unspecified: Secondary | ICD-10-CM | POA: Diagnosis not present

## 2022-05-04 DIAGNOSIS — E785 Hyperlipidemia, unspecified: Secondary | ICD-10-CM | POA: Diagnosis not present

## 2022-05-04 DIAGNOSIS — I130 Hypertensive heart and chronic kidney disease with heart failure and stage 1 through stage 4 chronic kidney disease, or unspecified chronic kidney disease: Secondary | ICD-10-CM | POA: Diagnosis not present

## 2022-05-04 DIAGNOSIS — I129 Hypertensive chronic kidney disease with stage 1 through stage 4 chronic kidney disease, or unspecified chronic kidney disease: Secondary | ICD-10-CM | POA: Diagnosis not present

## 2022-05-04 DIAGNOSIS — R279 Unspecified lack of coordination: Secondary | ICD-10-CM | POA: Diagnosis not present

## 2022-05-04 DIAGNOSIS — Z951 Presence of aortocoronary bypass graft: Secondary | ICD-10-CM | POA: Diagnosis not present

## 2022-05-04 DIAGNOSIS — I951 Orthostatic hypotension: Secondary | ICD-10-CM | POA: Diagnosis not present

## 2022-05-07 ENCOUNTER — Non-Acute Institutional Stay (SKILLED_NURSING_FACILITY): Payer: Medicare Other | Admitting: Student

## 2022-05-07 ENCOUNTER — Encounter: Payer: Self-pay | Admitting: Student

## 2022-05-07 DIAGNOSIS — I10 Essential (primary) hypertension: Secondary | ICD-10-CM | POA: Diagnosis not present

## 2022-05-07 DIAGNOSIS — I5022 Chronic systolic (congestive) heart failure: Secondary | ICD-10-CM | POA: Diagnosis not present

## 2022-05-07 DIAGNOSIS — I4819 Other persistent atrial fibrillation: Secondary | ICD-10-CM | POA: Diagnosis not present

## 2022-05-07 DIAGNOSIS — J449 Chronic obstructive pulmonary disease, unspecified: Secondary | ICD-10-CM

## 2022-05-07 DIAGNOSIS — G43909 Migraine, unspecified, not intractable, without status migrainosus: Secondary | ICD-10-CM | POA: Diagnosis not present

## 2022-05-07 DIAGNOSIS — K219 Gastro-esophageal reflux disease without esophagitis: Secondary | ICD-10-CM

## 2022-05-07 DIAGNOSIS — N1831 Chronic kidney disease, stage 3a: Secondary | ICD-10-CM

## 2022-05-07 DIAGNOSIS — I712 Thoracic aortic aneurysm, without rupture, unspecified: Secondary | ICD-10-CM

## 2022-05-07 DIAGNOSIS — Z8679 Personal history of other diseases of the circulatory system: Secondary | ICD-10-CM

## 2022-05-07 DIAGNOSIS — I77810 Thoracic aortic ectasia: Secondary | ICD-10-CM

## 2022-05-07 NOTE — Progress Notes (Addendum)
Provider:  Dr. Earnestine Mealing Location:  Other [place].  Nursing Home Room Number: [facility] 113A Place of Service:  SNF (31)  PCP: Georgann Housekeeper, MD Patient Care Team: Georgann Housekeeper, MD as PCP - General (Internal Medicine) Quintella Reichert, MD as PCP - Cardiology (Cardiology)  Extended Emergency Contact Information Primary Emergency Contact: Sciara,Jeff Mobile Phone: [phone] Relation: Son Secondary Emergency Contact: acker,diane Mobile Phone: [phone] Relation: Sister  Code Status: Full Code Goals of Care: Advanced Directive information    05/07/2022    9:30 AM  Advanced Directives  Does Patient Have a Medical Advance Directive? No  Would patient like information on creating a medical advance directive? No - Patient declined      Chief Complaint  Patient presents with   New Admit To SNF    Admission.     HPI: Patient is a 77 y.o. male seen today for admission to [facility].  Patient had a stroke and was in the ICU. He doesn't remember everything. Granddaughter called and he had garbled speech. His DiL called younger sister who then called older sister who lives near him and they went to the hopsitla. ICU 2 weeks, room 1 week then transferred to hBR> doesn't remmeber the first 3 weeks. [facility] is where.   His memory has been the biggisht challenge.   His sister is at bedside. His wife had "PPA" with dementia and she passed away 16-Oct-2018. He has lived at home alone since then. They moved from [place] to [facility]. And he went back to their old home in [place].  He is prepared to hire help.   Throughout the conversation his sister is shaking her head concerned of the inaccuracies of his history.   Past Medical History:  Diagnosis Date   Benign essential HTN 11/24/2013   BPH (benign prostatic hyperplasia)    patient unaware   CAD (coronary artery disease), native coronary artery 04/2011   S/P CABG X4 Sx done in [place]   Carotid artery  stenosis    1-39% bilateral by dopplers 2018   Chest wall pain    Right side, muscular,ortho,MRI. U/S abd ok. On lyrica   Chronic atrial fibrillation (HCC)    s/p DCCV 01/2016, subsequent reversion, patient denied AAD   Chronic insomnia    CKD (chronic kidney disease), stage III (HCC)    Colon polyp 10/11   DDD (degenerative disc disease), lumbar    Dilatation of aorta (HCC)    aortic root is mildly enlarged at 43mm and moderately enlarged ascending aorta at 45mm by echo 03/2020   ED (erectile dysfunction)    GERD (gastroesophageal reflux disease)    Gout    History of kidney stones    History of migraine    Ischemic cardiomyopathy    EF 35-40% with moderate RV dysfunction by echo 03/2020 and unchanged from echo 2018   Kidney stone    uric acid, Dr Isabel Caprice  renal insufficiency   Mitral regurgitation 06/04/2016   Moderate by echo 2020   Mixed hyperlipidemia 01/23/2013   Myocardial infarction (HCC)    OSA on CPAP 07/16/2016   Osteoarthritis    of the knee   TIA (transient ischemic attack)    questionable Mini stroke per patient   Past Surgical History:  Procedure Laterality Date   CARDIOVERSION [name] 01/20/2016   Procedure: CARDIOVERSION;  Surgeon: Vesta Mixer, MD;  Location: [facility] ENDOSCOPY;  Service: Cardiovascular;  Laterality: [name];   CARDIOVERSION [name] 03/23/2016  Procedure: CARDIOVERSION;  Surgeon: Quintella Reichert, MD;  Location: [facility] ENDOSCOPY;  Service: Cardiovascular;  Laterality: [name];   CARDIOVERSION     CATARACT EXTRACTION W/PHACO Right 10/26/2014   Procedure: CATARACT EXTRACTION PHACO AND INTRAOCULAR LENS PLACEMENT (IOC);  Surgeon: Galen Manila, MD;  Location: [facility] ORS;  Service: Ophthalmology;  Laterality: Right;  LOT PACK: [id number] H US:01:43.0 AP:27.8 CDE:28.68   CATARACT EXTRACTION W/PHACO Left 11/13/2016   Procedure: CATARACT EXTRACTION PHACO AND INTRAOCULAR LENS PLACEMENT (IOC);  Surgeon: Galen Manila, MD;  Location: [facility] ORS;  Service: Ophthalmology;   Laterality: Left;  [place] 01:00.2 AP% 15.7 CDE 9.47 Fluid Pack lot # [id number]   COLONOSCOPY WITH PROPOFOL [name] 09/27/2015   Procedure: COLONOSCOPY WITH PROPOFOL;  Surgeon: Charolett Bumpers, MD;  Location: [facility] ENDOSCOPY;  Service: Endoscopy;  Laterality: [name];   CORONARY ARTERY BYPASS GRAFT  04/2011   Severe multivessel ASCAD s/p Stemi w PTCA of OM2 then s/p CABG w LIMA to LAD,SVG to OM1 /RI and SVG to Distal RCA    CYSTOSCOPY/URETEROSCOPY/HOLMIUM LASER/STENT PLACEMENT Left 12/21/2019   Procedure: CYSTOSCOPY/RETROGREADE PYLEOGRAM/URETEROSCOPY/HOLMIUM LASER/STENT PLACEMENT;  Surgeon: Jannifer Hick, MD;  Location: [facility] ORS;  Service: Urology;  Laterality: Left;  ONLY NEEDS 60  MIN   FINGER SURGERY     left finger for ganglion cyst   KIDNEY STONE SURGERY     LEFT HEART CATH AND CORS/GRAFTS ANGIOGRAPHY [name] 12/02/2018   Procedure: LEFT HEART CATH AND CORS/GRAFTS ANGIOGRAPHY;  Surgeon: Lyn Records, MD;  Location: [facility] INVASIVE CV LAB;  Service: Cardiovascular;  Laterality: [name];   TONSILLECTOMY      reports that he has never smoked. He has never used smokeless tobacco. He reports that he does not currently use alcohol. He reports that he does not use drugs. Social History   Socioeconomic History   Marital status: Widowed    Spouse name: Not on file   Number of children: Not on file   Years of education: Not on file   Highest education level: Not on file  Occupational History   Not on file  Tobacco Use   Smoking status: Never   Smokeless tobacco: Never  Vaping Use   Vaping Use: Never used  Substance and Sexual Activity   Alcohol use: Not Currently    Comment: yes occassionally   Drug use: No   Sexual activity: Not on file  Other Topics Concern   Not on file  Social History Narrative   Not on file   Social Determinants of Health   Financial Resource Strain: Not on file  Food Insecurity: Not on file  Transportation Needs: Not on file  Physical Activity: Not on file  Stress: Not on file   Social Connections: Not on file  Intimate Partner Violence: Not on file    Functional Status Survey:    Family History  Problem Relation Age of Onset   Alzheimer's disease Mother    Cancer Mother    CVA Father    Arrhythmia Father    Arrhythmia Sister     Health Maintenance  Topic Date Due   Hepatitis C Screening  Never done   DTaP/Tdap/Td (1 - Tdap) Never done   Pneumonia Vaccine 53+ Years old (1 of 1 - PCV) Never done   Medicare Annual Wellness (AWV)  04/21/2021   COVID-19 Vaccine (5 - 2023-24 season) 09/01/2021   INFLUENZA VACCINE  08/02/2022   Zoster Vaccines- Shingrix  Completed   HPV VACCINES  Aged Out   COLONOSCOPY (Pts 45-9yrs Insurance coverage  will need to be confirmed)  Discontinued    Allergies  Allergen Reactions   Atorvastatin     Joint pain on 80mg  daily   Celebrex [Celecoxib]     Upset stomach   Entresto [Sacubitril-Valsartan]     Low BP/difficulty talking/migraine    Outpatient Encounter Medications as of 05/07/2022  Medication Sig   acetaminophen (TYLENOL) 325 MG tablet Take 650 mg by mouth 4 (four) times daily.   albuterol (PROVENTIL) (2.5 MG/3ML) 0.083% nebulizer solution Take 2.5 mg by nebulization every 6 (six) hours as needed for wheezing or shortness of breath.   allopurinol (ZYLOPRIM) 100 MG tablet Take 100 mg by mouth daily.   ALPRAZolam (XANAX) 1 MG tablet Take 1 mg by mouth at bedtime.   Butalbital-APAP-Caffeine 50-325-40 MG capsule Take 1 capsule by mouth every 12 (twelve) hours as needed for migraine.   escitalopram (LEXAPRO) 10 MG tablet Take 10 mg by mouth daily.   metoprolol tartrate (LOPRESSOR) 25 MG tablet Take 12.5 mg by mouth 2 (two) times daily.   mometasone (NASONEX) 50 MCG/ACT nasal spray Place 2 sprays into the nose daily as needed (congestion).   nitroGLYCERIN (NITROSTAT) 0.4 MG SL tablet Place 1 tablet (0.4 mg total) under the tongue every 5 (five) minutes as needed for chest pain.   ondansetron (ZOFRAN-ODT) 4 MG  disintegrating tablet Take 4 mg by mouth every 6 (six) hours as needed for nausea or vomiting.   oxyCODONE (OXY IR/ROXICODONE) 5 MG immediate release tablet Take 5 mg by mouth every 4 (four) hours as needed for severe pain.   pantoprazole (PROTONIX) 40 MG tablet Take 40 mg by mouth daily.   polyethylene glycol (MIRALAX / GLYCOLAX) 17 g packet Take 17 g by mouth daily as needed.   pregabalin (LYRICA) 50 MG capsule Take 50 mg by mouth 3 (three) times daily.   Rivaroxaban (XARELTO) 15 MG TABS tablet Take 15 mg by mouth daily with supper.   rosuvastatin (CRESTOR) 20 MG tablet Take 20 mg by mouth daily.   simethicone (MYLICON) 80 MG chewable tablet Chew 80 mg by mouth every 8 (eight) hours as needed for flatulence.   VASCEPA 1 g capsule Take 2 capsules (2 g total) by mouth 2 (two) times daily.   Zinc Oxide (TRIPLE PASTE) 12.8 % ointment Apply 1 Application topically. To buttocks every shift.   [DISCONTINUED] docusate sodium (COLACE) 100 MG capsule Take 1 capsule (100 mg total) by mouth daily as needed for up to 30 doses. (Patient taking differently: Take 100 mg by mouth daily as needed for mild constipation.)   [DISCONTINUED] esomeprazole (NEXIUM) 20 MG packet Take 20 mg by mouth daily before breakfast. (Patient not taking: Reported on 08/09/2021)   [DISCONTINUED] isosorbide mononitrate (IMDUR) 30 MG 24 hr tablet Take 0.5 tablets (15 mg total) by mouth daily. Please call [phone] to schedule an overdue appointment for future refills. Thank you. 2nd attempt.   [DISCONTINUED] losartan (COZAAR) 25 MG tablet Take 1 tablet (25 mg total) by mouth daily as needed (high blood pressure). (Patient taking differently: Take 25 mg by mouth daily.)   [DISCONTINUED] metoprolol tartrate (LOPRESSOR) 25 MG tablet Take 1 tablet (25 mg total) by mouth 2 (two) times daily. (Patient taking differently: Take 12.5 mg by mouth 2 (two) times daily.)   [DISCONTINUED] rosuvastatin (CRESTOR) 20 MG tablet Take 1 tablet (20 mg total)  by mouth daily. Please call [phone] to schedule an overdue appointment with Dr. Armanda Magic for future refills. Thank you. Final attempt. (Patient taking  differently: Take 20 mg by mouth daily.)   [DISCONTINUED] tamsulosin (FLOMAX) 0.4 MG CAPS capsule Take 0.4 mg by mouth daily.   [DISCONTINUED] XARELTO 20 MG TABS tablet TAKE 1 TABLET BY MOUTH DAILY WITH SUPPER   [DISCONTINUED] zolpidem (AMBIEN) 5 MG tablet Take 5 mg by mouth at bedtime. (Patient not taking: Reported on 08/09/2021)   No facility-administered encounter medications on file as of 05/07/2022.    Review of Systems  Vitals:   05/07/22 0909  BP: 120/75  Pulse: 98  Resp: 18  Temp: 98 F (36.7 C)  SpO2: 96%  Weight: 179 lb 6.4 oz (81.4 kg)  Height: 5\' 9"  (1.753 m)   Body mass index is 26.49 kg/m. Physical Exam Constitutional:      Appearance: Normal appearance.  Cardiovascular:     Rate and Rhythm: Normal rate and regular rhythm.     Pulses: Normal pulses.     Heart sounds: Normal heart sounds.  Pulmonary:     Effort: Pulmonary effort is normal.     Breath sounds: Normal breath sounds.  Abdominal:     General: Abdomen is flat.     Palpations: Abdomen is soft.  Skin:    General: Skin is warm and dry.  Neurological:     General: No focal deficit present.     Mental Status: He is alert and oriented to person, place, and time.     Labs reviewed: Basic Metabolic Panel: No results for input(s): "NA", "K", "CL", "CO2", "GLUCOSE", "BUN", "CREATININE", "CALCIUM", "MG", "PHOS" in the last 8760 hours. Liver Function Tests: No results for input(s): "AST", "ALT", "ALKPHOS", "BILITOT", "PROT", "ALBUMIN" in the last 8760 hours. No results for input(s): "LIPASE", "AMYLASE" in the last 8760 hours. No results for input(s): "AMMONIA" in the last 8760 hours. CBC: No results for input(s): "WBC", "NEUTROABS", "HGB", "HCT", "MCV", "PLT" in the last 8760 hours. Cardiac Enzymes: No results for input(s): "CKTOTAL", "CKMB",  "CKMBINDEX", "TROPONINI" in the last 8760 hours. BNP: Invalid input(s): "POCBNP" Lab Results  Component Value Date   HGBA1C 6.1 (H) 01/21/2021   No results found for: "TSH" No results found for: "VITAMINB12" No results found for: "FOLATE" No results found for: "IRON", "TIBC", "FERRITIN"  Imaging and Procedures obtained prior to SNF admission: CT ANGIO CHEST AORTA W/CM & OR WO/CM  Result Date: 08/09/2021 CLINICAL DATA:  Thoracic aortic aneurysm. EXAM: CT ANGIOGRAPHY CHEST WITH CONTRAST TECHNIQUE: Multidetector CT imaging of the chest was performed using the standard protocol during bolus administration of intravenous contrast. Multiplanar CT image reconstructions and MIPs were obtained to evaluate the vascular anatomy. RADIATION DOSE REDUCTION: This exam was performed according to the departmental dose-optimization program which includes automated exposure control, adjustment of the mA and/or kV according to patient size and/or use of iterative reconstruction technique. CONTRAST:  75mL ISOVUE-370 IOPAMIDOL (ISOVUE-370) INJECTION 76% COMPARISON:  March 28, 2021. FINDINGS: Cardiovascular: Grossly stable 4.6 cm ascending thoracic aortic aneurysm is noted. No dissection is noted. Great vessels are widely patent. Normal cardiac size. No pericardial effusion. Status post coronary bypass graft. Mediastinum/Nodes: No enlarged mediastinal, hilar, or axillary lymph nodes. Thyroid gland, trachea, and esophagus demonstrate no significant findings. Lungs/Pleura: Lungs are clear. No pleural effusion or pneumothorax. Upper Abdomen: No acute abnormality. Musculoskeletal: No chest wall abnormality. No acute or significant osseous findings. Review of the MIP images confirms the above findings. IMPRESSION: Grossly stable 4.6 cm ascending thoracic aortic aneurysm. Recommend semi-annual imaging followup by CTA or MRA and referral to cardiothoracic surgery if not already obtained.  This recommendation follows 2010  ACCF/AHA/AATS/ACR/ASA/SCA/SCAI/SIR/STS/SVM Guidelines for the Diagnosis and Management of Patients With Thoracic Aortic Disease. Circulation. 2010; 121: Z610-R604. Aortic aneurysm NOS (ICD10-I71.9). Status post coronary bypass graft. Electronically Signed   By: Lupita Raider M.D.   On: 08/09/2021 12:54    Assessment/Plan 1. Chronic systolic heart failure (HCC) Appears euvolemic on exam. Continue BB.   2. Thoracic aortic aneurysm without rupture, unspecified part (HCC) Concern for impact on low likelihood of recovery in the event patient has cardiac arrest.  Discussed GOC. Patient is currently full code per his preference and living will. Will need additional conversations as patient's stroke has left significant cognitive gaps regarding insight to current illness and memory retention of needs.   3. Chronic obstructive pulmonary disease, unspecified COPD type (HCC) Lungs clear. Symptoms well-controlled. Without medications at this time.   4. History of cerebellar hemorrhage Recent cerebellar hemorrhage with some memory deficits, however, intact strength and coordination. Patient will likely continue to need 24 hour care, will continue conversations with family and caregivers regarding plan. Continue crestor, xarelto, and adequte BP control.   5. Gastro-esophageal reflux disease without esophagitis Continue protonix.   6. Hypertension, unspecified type BP well-controlled. Continue lopressor 25 mg daily.   8. Persistent atrial fibrillation (HCC) Rate well-controlled on current regimen. Continue Xarelto  9. Stage 3a chronic kidney disease (HCC) Plan for BMP in 1 week.   10. Migraine New post CVA. Will continue Esgic for now. Will reassess prior to discharge, goal of discontinuing prior to discharge given polypharmacy and high risk for confusion. Patient was also started on lyrica inpatient, will continue for now.     Family/ staff Communication:   Labs/tests ordered:   I spent  greater than 45 minutes for the care of this patient in face to face time, chart review, clinical documentation, patient education.

## 2022-05-08 ENCOUNTER — Telehealth: Payer: Self-pay | Admitting: Cardiology

## 2022-05-08 DIAGNOSIS — I5022 Chronic systolic (congestive) heart failure: Secondary | ICD-10-CM | POA: Insufficient documentation

## 2022-05-08 DIAGNOSIS — Z8679 Personal history of other diseases of the circulatory system: Secondary | ICD-10-CM | POA: Insufficient documentation

## 2022-05-08 NOTE — Telephone Encounter (Signed)
Dr. Madelaine Bhat from [facility] called in asking for Dr. Mayford Knife to c/b to discuss placing pt in a [facility] Clinical trial.   Best c/b: [phone]

## 2022-05-09 ENCOUNTER — Telehealth: Payer: Self-pay | Admitting: Cardiology

## 2022-05-09 NOTE — Telephone Encounter (Signed)
Patient called and wants to know if he needs a cardiac clearance to travel to [place] for graduation party

## 2022-05-09 NOTE — Telephone Encounter (Signed)
Spoke with Heidi, the patient's daughter, at length about the Watchman implant, including workup, procedure, and follow-up.  She stated the patient is still in rehab and most likely will be for 2-3 weeks.   She states he is still very confused and she is unsure whether or not he will want to proceed with Watchman implant. She will talk with her family and him over the weekend and call back on Monday with a decision.  She was grateful for call and time spent.

## 2022-05-09 NOTE — Telephone Encounter (Signed)
Called patient's step-daughter Heidi [facility]) back to discuss patient's questions. Heidi states patient is in nursing facility s/p hemorrhagic CVA and was just given back his phone. He is very confused and has been calling us in error about cardiac clearance. Heidi verbalizes understanding that any clearance would need to come from PCP or maybe neurology as we have not seen patient in person in at least a year. Also explained patient has been referred for Watchman Device so he can potentially be off eliquis. Heidi verbalizes understanding and agrees to plan.

## 2022-05-09 NOTE — Telephone Encounter (Signed)
Pt step daughter calling back to f/u on this note, she states again from previous phone note pt is very confused and disoriented, she ask that you call her back not the pt.

## 2022-05-09 NOTE — Telephone Encounter (Signed)
Pt step daughter called in stating she would more info on clinical trial and what it entails and she would like for info to be discussed with her because pt is still a little "confused" on things.

## 2022-05-09 NOTE — Telephone Encounter (Signed)
Spoke with the patient, he is currently admitted in Rehab in [place]. Pt stated his expected discharge date is sometime next week. He will call the office once he notified the discharge date. Pt mentioned he hasn't had any symptoms of A-fib in a while. Will forward to MD and nurse.

## 2022-05-10 ENCOUNTER — Other Ambulatory Visit: Payer: Self-pay | Admitting: Nurse Practitioner

## 2022-05-10 MED ORDER — ALPRAZOLAM 1 MG PO TABS: 1.0000 mg | ORAL_TABLET | Freq: Every day | ORAL | 0 refills | Status: DC

## 2022-05-13 NOTE — Progress Notes (Deleted)
Cardiology Clinic Note   Patient Name: Sean Willis Date of Encounter: 05/13/2022  Primary Care Provider:  Georgann Housekeeper, MD Primary Cardiologist:  Sean Magic, MD  Patient Profile    Sean Willis 77 year old male presents the clinic today for follow-up evaluation of his coronary artery disease and chronic systolic CHF.  Past Medical History    Past Medical History:  Diagnosis Date   Benign essential HTN 11/24/2013   BPH (benign prostatic hyperplasia)    patient unaware   CAD (coronary artery disease), native coronary artery 04/2011   S/P CABG X4 Sx done in [place]   Carotid artery stenosis    1-39% bilateral by dopplers 2018   Chest wall pain    Right side, muscular,ortho,MRI. U/S abd ok. On lyrica   Chronic atrial fibrillation (HCC)    s/p DCCV 01/2016, subsequent reversion, patient denied AAD   Chronic insomnia    CKD (chronic kidney disease), stage III (HCC)    Colon polyp 10/11   DDD (degenerative disc disease), lumbar    Dilatation of aorta (HCC)    aortic root is mildly enlarged at 43mm and moderately enlarged ascending aorta at 45mm by echo 03/2020   ED (erectile dysfunction)    GERD (gastroesophageal reflux disease)    Gout    History of kidney stones    History of migraine    Ischemic cardiomyopathy    EF 35-40% with moderate RV dysfunction by echo 03/2020 and unchanged from echo 2018   Kidney stone    uric acid, Dr Sean Willis  renal insufficiency   Mitral regurgitation 06/04/2016   Moderate by echo 2020   Mixed hyperlipidemia 01/23/2013   Myocardial infarction (HCC)    OSA on CPAP 07/16/2016   Osteoarthritis    of the knee   TIA (transient ischemic attack)    questionable Mini stroke per patient   Past Surgical History:  Procedure Laterality Date   CARDIOVERSION [name] 01/20/2016   Procedure: CARDIOVERSION;  Surgeon: Sean Mixer, MD;  Location: [facility] ENDOSCOPY;  Service: Cardiovascular;  Laterality: [name];   CARDIOVERSION [name] 03/23/2016    Procedure: CARDIOVERSION;  Surgeon: Sean Reichert, MD;  Location: [facility] ENDOSCOPY;  Service: Cardiovascular;  Laterality: [name];   CARDIOVERSION     CATARACT EXTRACTION W/PHACO Right 10/26/2014   Procedure: CATARACT EXTRACTION PHACO AND INTRAOCULAR LENS PLACEMENT (IOC);  Surgeon: Sean Manila, MD;  Location: [facility] ORS;  Service: Ophthalmology;  Laterality: Right;  LOT PACK: [id number] H US:01:43.0 AP:27.8 CDE:28.68   CATARACT EXTRACTION W/PHACO Left 11/13/2016   Procedure: CATARACT EXTRACTION PHACO AND INTRAOCULAR LENS PLACEMENT (IOC);  Surgeon: Sean Manila, MD;  Location: [facility] ORS;  Service: Ophthalmology;  Laterality: Left;  [place] 01:00.2 AP% 15.7 CDE 9.47 Fluid Pack lot # [id number]   COLONOSCOPY WITH PROPOFOL [name] 09/27/2015   Procedure: COLONOSCOPY WITH PROPOFOL;  Surgeon: Sean Bumpers, MD;  Location: [facility] ENDOSCOPY;  Service: Endoscopy;  Laterality: [name];   CORONARY ARTERY BYPASS GRAFT  04/2011   Severe multivessel ASCAD s/p Stemi w PTCA of OM2 then s/p CABG w LIMA to LAD,SVG to OM1 /RI and SVG to Distal RCA    CYSTOSCOPY/URETEROSCOPY/HOLMIUM LASER/STENT PLACEMENT Left 12/21/2019   Procedure: CYSTOSCOPY/RETROGREADE PYLEOGRAM/URETEROSCOPY/HOLMIUM LASER/STENT PLACEMENT;  Surgeon: Sean Hick, MD;  Location: [facility] ORS;  Service: Urology;  Laterality: Left;  ONLY NEEDS 60  MIN   FINGER SURGERY     left finger for ganglion cyst   KIDNEY STONE SURGERY     LEFT HEART CATH AND CORS/GRAFTS  ANGIOGRAPHY [name] 12/02/2018   Procedure: LEFT HEART CATH AND CORS/GRAFTS ANGIOGRAPHY;  Surgeon: Sean Records, MD;  Location: [facility] INVASIVE CV LAB;  Service: Cardiovascular;  Laterality: [name];   TONSILLECTOMY      Allergies  Allergies  Allergen Reactions   Atorvastatin     Joint pain on 80mg  daily   Celebrex [Celecoxib]     Upset stomach   Entresto [Sacubitril-Valsartan]     Low BP/difficulty talking/migraine    History of Present Illness    [name] has a PMH of coronary artery disease,  aortic dilation, HTN, carotid artery disease, persistent atrial fibrillation, mitral valve regurgitation, aortic root dilation, chronic systolic CHF, OSA on CPAP, prediabetes, and GERD.  He is status post CABG times 04/2011.  He presented to the emergency department 04/23/2022 with 1 day of confusion.  He was noted to have a intracranial hemorrhage within the left cerebral lower hemisphere which was noted on head CT.  MRI brain showed stable left cerebral and IVH hypertensive microvascular hemorrhage in his basal ganglia and ophthalmic reasons.  His hemorrhage was felt to be due to hypertension in the setting of Xarelto.  His Xarelto was reversed.  (EVD placed 4/10 and removed on 4/19.  He was admitted to [facility] rehab 4/22.  He has continued to progress during his rehab stay.  He has been working with PT and speech therapy.  His repeat head CT 4/29 showed interval improvement of his intra cerebellar hemorrhage.  It was felt safe to resume Xarelto at that time.  He presents to the clinic today for follow-up evaluation and states***.  *** denies chest pain, shortness of breath, lower extremity edema, fatigue, palpitations, melena, hematuria, hemoptysis, diaphoresis, weakness, presyncope, syncope, orthopnea, and PND.  Paroxysmal atrial fibrillation-heart rate today***.  Compliant with Xarelto.  Recent intracranial hemorrhage within the left cerebellar hemisphere.  Went through rehab at [facility].  May be a good candidate for Watchman device. Continue metoprolol, Xarelto Follow-up with EP.  Intracranial hemorrhage-slowly improving neurologically.  Completed rehab.  Noted to have left cerebellar hemisphere intracranial hemorrhage.  Required pause of his Xarelto. Follows with neurology at [facility] No change in therapy  Coronary artery disease-no chest pain today.  Status post CABG. Continue current medical therapy Low-sodium diet Continue to increase physical activity  Hyperlipidemia-LDL***. High-fiber  diet Continue rosuvastatin, Vascepa  Essential hypertension-BP today***. Maintain blood pressure log Continue metoprolol   Disposition: Follow-up with Dr. Mayford Knife or APP in 4-6 months.   Home Medications    Prior to Admission medications   Medication Sig Start Date End Date Taking? Authorizing Provider  acetaminophen (TYLENOL) 325 MG tablet Take 650 mg by mouth 4 (four) times daily.    [provider]  albuterol (PROVENTIL) (2.5 MG/3ML) 0.083% nebulizer solution Take 2.5 mg by nebulization every 6 (six) hours as needed for wheezing or shortness of breath.    [provider]  allopurinol (ZYLOPRIM) 100 MG tablet Take 100 mg by mouth daily.    [provider]  ALPRAZolam Prudy Feeler) 1 MG tablet Take 1 tablet (1 mg total) by mouth at bedtime. 05/10/22   Sharon Seller, NP  Butalbital-APAP-Caffeine [phone] MG capsule Take 1 capsule by mouth every 12 (twelve) hours as needed for migraine. 10/30/19   [provider]  escitalopram (LEXAPRO) 10 MG tablet Take 10 mg by mouth daily. 11/17/19   [provider]  metoprolol tartrate (LOPRESSOR) 25 MG tablet Take 12.5 mg by mouth 2 (two) times daily.    [provider]  mometasone (NASONEX) 50 MCG/ACT nasal spray Place 2 sprays into the nose daily as needed (congestion).    [provider]  nitroGLYCERIN (NITROSTAT) 0.4 MG SL tablet Place 1 tablet (0.4 mg total) under the tongue every 5 (five) minutes as needed for chest pain. 12/23/18   Rosalio Macadamia, NP  ondansetron (ZOFRAN-ODT) 4 MG disintegrating tablet Take 4 mg by mouth every 6 (six) hours as needed for nausea or vomiting.    [provider]  pantoprazole (PROTONIX) 40 MG tablet Take 40 mg by mouth daily.    [provider]  polyethylene glycol (MIRALAX / GLYCOLAX) 17 g packet Take 17 g by mouth daily as needed.    [provider]  pregabalin (LYRICA) 50 MG capsule Take 50 mg by mouth 3 (three) times daily.     [provider]  Rivaroxaban (XARELTO) 15 MG TABS tablet Take 15 mg by mouth daily with supper.    [provider]  rosuvastatin (CRESTOR) 20 MG tablet Take 20 mg by mouth daily.    [provider]  simethicone (MYLICON) 80 MG chewable tablet Chew 80 mg by mouth every 8 (eight) hours as needed for flatulence.    [provider]  VASCEPA 1 g capsule Take 2 capsules (2 g total) by mouth 2 (two) times daily. 07/11/20   Sean Reichert, MD  Zinc Oxide (TRIPLE PASTE) 12.8 % ointment Apply 1 Application topically. To buttocks every shift.    [provider]    Family History    Family History  Problem Relation Age of Onset   Alzheimer's disease Mother    Cancer Mother    CVA Father    Arrhythmia Father    Arrhythmia Sister    He indicated that his mother is deceased. He indicated that his father is deceased. He indicated that both of his sisters are alive. He indicated that his maternal grandmother is deceased. He indicated that his maternal grandfather is deceased. He indicated that his paternal grandmother is deceased. He indicated that his paternal grandfather is deceased.  Social History    Social History   Socioeconomic History   Marital status: Widowed    Spouse name: Not on file   Number of children: Not on file   Years of education: Not on file   Highest education level: Not on file  Occupational History   Not on file  Tobacco Use   Smoking status: Never   Smokeless tobacco: Never  Vaping Use   Vaping Use: Never used  Substance and Sexual Activity   Alcohol use: Not Currently    Comment: yes occassionally   Drug use: No   Sexual activity: Not on file  Other Topics Concern   Not on file  Social History Narrative   Not on file   Social Determinants of Health   Financial Resource Strain: Not on file  Food Insecurity: Not on file  Transportation Needs: Not on file  Physical Activity: Not on file  Stress: Not on file   Social Connections: Not on file  Intimate Partner Violence: Not on file     Review of Systems    General:  No chills, fever, night sweats or weight changes.  Cardiovascular:  No chest pain, dyspnea on exertion, edema, orthopnea, palpitations, paroxysmal nocturnal dyspnea. Dermatological: No rash, lesions/masses Respiratory: No cough, dyspnea Urologic: No hematuria, dysuria Abdominal:   No nausea, vomiting, diarrhea, bright red blood per rectum, melena, or hematemesis Neurologic:  No  visual changes, wkns, changes in mental status. All other systems reviewed and are otherwise negative except as noted above.  Physical Exam    VS:  There were no vitals taken for this visit. , BMI There is no height or weight on file to calculate BMI. GEN: Well nourished, well developed, in no acute distress. HEENT: normal. Neck: Supple, no JVD, carotid bruits, or masses. Cardiac: RRR, no murmurs, rubs, or gallops. No clubbing, cyanosis, edema.  Radials/DP/PT 2+ and equal bilaterally.  Respiratory:  Respirations regular and unlabored, clear to auscultation bilaterally. GI: Soft, nontender, nondistended, BS + x 4. MS: no deformity or atrophy. Skin: warm and dry, no rash. Neuro:  Strength and sensation are intact. Psych: Normal affect.  Accessory Clinical Findings    Recent Labs: No results found for requested labs within last 365 days.   Recent Lipid Panel    Component Value Date/Time   CHOL 132 01/21/2021 0207   CHOL 133 09/09/2020 0832   CHOL 123 01/14/2013 0923   TRIG 154 (H) 01/21/2021 0207   TRIG 141 01/14/2013 0923   HDL 28 (L) 01/21/2021 0207   HDL 28 (L) 09/09/2020 0832   HDL 34 (L) 01/14/2013 0923   CHOLHDL 4.7 01/21/2021 0207   VLDL 31 01/21/2021 0207   LDLCALC 73 01/21/2021 0207   LDLCALC 72 09/09/2020 0832   LDLCALC 61 01/14/2013 0923    No BP recorded.  {Refresh Note OR Click here to enter BP  :1}***    ECG personally reviewed by me today- *** - No acute  changes  Echocardiogram 01/20/2021 IMPRESSIONS     1. Left ventricular ejection fraction, by estimation, is 40 to 45%. The  left ventricle has mildly decreased function. The left ventricle  demonstrates global hypokinesis. Left ventricular diastolic parameters are  indeterminate.   2. Right ventricular systolic function is normal. The right ventricular  size is normal. Tricuspid regurgitation signal is inadequate for assessing  PA pressure.   3. Left atrial size was severely dilated.   4. Right atrial size was mildly dilated.   5. The mitral valve is grossly normal. No evidence of mitral valve  regurgitation.   6. The aortic valve is grossly normal. Aortic valve regurgitation is not  visualized.   7. Aortic ascending aortic aneurysm 4.2 cm.   8. The inferior vena cava is normal in size with greater than 50%  respiratory variability, suggesting right atrial pressure of 3 mmHg.   FINDINGS   Left Ventricle: Left ventricular ejection fraction, by estimation, is 40  to 45%. The left ventricle has mildly decreased function. The left  ventricle demonstrates global hypokinesis. The left ventricular internal  cavity size was normal in size. There is   no left ventricular hypertrophy. Left ventricular diastolic parameters  are indeterminate.   Right Ventricle: The right ventricular size is normal. No increase in  right ventricular wall thickness. Right ventricular systolic function is  normal. Tricuspid regurgitation signal is inadequate for assessing PA  pressure.   Left Atrium: Left atrial size was severely dilated.   Right Atrium: Right atrial size was mildly dilated.   Pericardium: There is no evidence of pericardial effusion.   Mitral Valve: The mitral valve is grossly normal. No evidence of mitral  valve regurgitation.   Tricuspid Valve: The tricuspid valve is grossly normal. Tricuspid valve  regurgitation is not demonstrated.   Aortic Valve: The aortic valve is grossly  normal. Aortic valve  regurgitation is not visualized.   Pulmonic Valve: The pulmonic  valve was grossly normal. Pulmonic valve  regurgitation is not visualized.   Aorta: Ascending aortic aneurysm 4.2 cm.   Venous: The inferior vena cava is normal in size with greater than 50%  respiratory variability, suggesting right atrial pressure of 3 mmHg.    Assessment & Plan   1.  ***   Thomasene Ripple. Johntavius Shepard NP-C     05/13/2022, 6:51 PM [facility] [address] Office [phone] Fax [phone]    I spent***minutes examining this patient, reviewing medications, and using patient centered shared decision making involving her cardiac care.  Prior to her visit I spent greater than 20 minutes reviewing her past medical history,  medications, and prior cardiac tests.

## 2022-05-14 ENCOUNTER — Telehealth: Payer: Self-pay | Admitting: Cardiology

## 2022-05-14 NOTE — Telephone Encounter (Signed)
Daughter-in- law want call back to check if patient needs visit tomorrow (5/14) as has EP visit in June.

## 2022-05-14 NOTE — Telephone Encounter (Signed)
Patient and step daughter Sean Willis calling in to cancel 05/15/22 appt as it was made in error.

## 2022-05-14 NOTE — Telephone Encounter (Signed)
Sean Willis stated she spoke with her family and wishes to have a Watchman consult.  Scheduled [name] for Watchman consult with Dr. Excell Seltzer 06/19/2022. Sean Willis was grateful for assistance.

## 2022-05-15 ENCOUNTER — Ambulatory Visit: Payer: Medicare Other | Admitting: General Practice

## 2022-05-22 DIAGNOSIS — N200 Calculus of kidney: Secondary | ICD-10-CM | POA: Diagnosis not present

## 2022-05-22 DIAGNOSIS — N281 Cyst of kidney, acquired: Secondary | ICD-10-CM | POA: Diagnosis not present

## 2022-05-22 DIAGNOSIS — N401 Enlarged prostate with lower urinary tract symptoms: Secondary | ICD-10-CM | POA: Diagnosis not present

## 2022-05-22 DIAGNOSIS — R338 Other retention of urine: Secondary | ICD-10-CM | POA: Diagnosis not present

## 2022-05-29 ENCOUNTER — Other Ambulatory Visit: Payer: Self-pay | Admitting: Nurse Practitioner

## 2022-05-29 ENCOUNTER — Non-Acute Institutional Stay (SKILLED_NURSING_FACILITY): Payer: Medicare Other | Admitting: Nurse Practitioner

## 2022-05-29 ENCOUNTER — Encounter: Payer: Self-pay | Admitting: Nurse Practitioner

## 2022-05-29 DIAGNOSIS — I5022 Chronic systolic (congestive) heart failure: Secondary | ICD-10-CM

## 2022-05-29 DIAGNOSIS — E782 Mixed hyperlipidemia: Secondary | ICD-10-CM

## 2022-05-29 DIAGNOSIS — I2510 Atherosclerotic heart disease of native coronary artery without angina pectoris: Secondary | ICD-10-CM

## 2022-05-29 DIAGNOSIS — G4733 Obstructive sleep apnea (adult) (pediatric): Secondary | ICD-10-CM | POA: Diagnosis not present

## 2022-05-29 DIAGNOSIS — I10 Essential (primary) hypertension: Secondary | ICD-10-CM | POA: Diagnosis not present

## 2022-05-29 DIAGNOSIS — N200 Calculus of kidney: Secondary | ICD-10-CM

## 2022-05-29 DIAGNOSIS — F419 Anxiety disorder, unspecified: Secondary | ICD-10-CM

## 2022-05-29 DIAGNOSIS — I4819 Other persistent atrial fibrillation: Secondary | ICD-10-CM

## 2022-05-29 DIAGNOSIS — Z8679 Personal history of other diseases of the circulatory system: Secondary | ICD-10-CM

## 2022-05-29 DIAGNOSIS — K219 Gastro-esophageal reflux disease without esophagitis: Secondary | ICD-10-CM

## 2022-05-29 DIAGNOSIS — N1831 Chronic kidney disease, stage 3a: Secondary | ICD-10-CM | POA: Diagnosis not present

## 2022-05-29 DIAGNOSIS — J449 Chronic obstructive pulmonary disease, unspecified: Secondary | ICD-10-CM | POA: Diagnosis not present

## 2022-05-29 DIAGNOSIS — M109 Gout, unspecified: Secondary | ICD-10-CM

## 2022-05-29 MED ORDER — METOPROLOL TARTRATE 25 MG PO TABS: 12.5000 mg | ORAL_TABLET | Freq: Two times a day (BID) | ORAL | 0 refills | Status: DC

## 2022-05-29 MED ORDER — ALBUTEROL SULFATE (2.5 MG/3ML) 0.083% IN NEBU: 2.5000 mg | INHALATION_SOLUTION | Freq: Four times a day (QID) | RESPIRATORY_TRACT | 0 refills | Status: DC | PRN

## 2022-05-29 MED ORDER — MOMETASONE FUROATE 50 MCG/ACT NA SUSP: 2.0000 | Freq: Every day | NASAL | 0 refills | Status: DC | PRN

## 2022-05-29 MED ORDER — ALLOPURINOL 100 MG PO TABS
100.0000 mg | ORAL_TABLET | Freq: Every day | ORAL | 0 refills | Status: DC
Start: 2022-05-29 — End: 2023-01-24

## 2022-05-29 MED ORDER — PREGABALIN 50 MG PO CAPS: 50.0000 mg | ORAL_CAPSULE | Freq: Three times a day (TID) | ORAL | 0 refills | Status: DC

## 2022-05-29 MED ORDER — ONDANSETRON 4 MG PO TBDP: 4.0000 mg | ORAL_TABLET | Freq: Four times a day (QID) | ORAL | 0 refills | Status: DC | PRN

## 2022-05-29 MED ORDER — ROSUVASTATIN CALCIUM 20 MG PO TABS: 20.0000 mg | ORAL_TABLET | Freq: Every day | ORAL | 0 refills | Status: DC

## 2022-05-29 MED ORDER — VASCEPA 1 G PO CAPS
2.0000 g | ORAL_CAPSULE | Freq: Two times a day (BID) | ORAL | 0 refills | Status: DC
Start: 2022-05-29 — End: 2023-01-24

## 2022-05-29 MED ORDER — NITROGLYCERIN 0.4 MG SL SUBL
0.4000 mg | SUBLINGUAL_TABLET | SUBLINGUAL | 0 refills | Status: DC | PRN
Start: 2022-05-29 — End: 2022-07-13

## 2022-05-29 MED ORDER — RIVAROXABAN 15 MG PO TABS
15.0000 mg | ORAL_TABLET | Freq: Every day | ORAL | 0 refills | Status: DC
Start: 2022-05-29 — End: 2023-01-24

## 2022-05-29 MED ORDER — POLYETHYLENE GLYCOL 3350 17 G PO PACK: 17.0000 g | PACK | Freq: Every day | ORAL | 0 refills | Status: DC | PRN

## 2022-05-29 MED ORDER — ALPRAZOLAM 1 MG PO TABS
1.0000 mg | ORAL_TABLET | Freq: Every day | ORAL | 0 refills | Status: DC
Start: 2022-05-29 — End: 2023-01-24

## 2022-05-29 MED ORDER — PANTOPRAZOLE SODIUM 40 MG PO TBEC
40.0000 mg | DELAYED_RELEASE_TABLET | Freq: Every day | ORAL | 0 refills | Status: DC
Start: 2022-05-29 — End: 2022-06-19

## 2022-05-29 MED ORDER — ESCITALOPRAM OXALATE 10 MG PO TABS
10.0000 mg | ORAL_TABLET | Freq: Every day | ORAL | 0 refills | Status: DC
Start: 2022-05-29 — End: 2023-01-24

## 2022-05-29 NOTE — Progress Notes (Unsigned)
Location:  Other [place].  Nursing Home Room Number: [facility] 113A Place of Service:  SNF (31) Abbey Chatters, NP  PCP: Georgann Housekeeper, MD  Patient Care Team: Georgann Housekeeper, MD as PCP - General (Internal Medicine) Quintella Reichert, MD as PCP - Cardiology (Cardiology)  Extended Emergency Contact Information Primary Emergency Contact: Catterton,Jeff Mobile Phone: [phone] Relation: Son Secondary Emergency Contact: acker,diane Mobile Phone: [phone] Relation: Sister  Goals of care: Advanced Directive information    05/29/2022    2:29 PM  Advanced Directives  Does Patient Have a Medical Advance Directive? No  Would patient like information on creating a medical advance directive? No - Patient declined     Chief Complaint  Patient presents with   Discharge Note    Discharge    HPI:  Pt is a 77 y.o. male seen today for Discharge home. He was admitted to [facility] for acute ICH and now at [facility] for rehab.  on aspirin and Xarelto (4/7) w/ PMH afib, CABG in 2013, HTN, HLD, ocular migraines who presented to [facility] 04/09/2022 with one day of headache, dizziness, mildly slurred speech found to have left cerebellar with IVH. Has been at [facility] for 3 weeks. He has a lot of support at home.  He will have supervision 24/7 while at home.  His sister and granddaughter help with appts and drives him.   Plans to follow up with neurosurgery and cardiologist after ICH  Noted to have blood in urine this morning. No trouble voiding, no pain. He has had this in the past due to his kidney stones, will follow up with urology in 3 days  Needs Rx called into pharmacy.   Past Medical History:  Diagnosis Date   Benign essential HTN 11/24/2013   BPH (benign prostatic hyperplasia)    patient unaware   CAD (coronary artery disease), native coronary artery 04/2011   S/P CABG X4 Sx done in [place]   Carotid artery stenosis    1-39% bilateral by dopplers 2018   Chest wall  pain    Right side, muscular,ortho,MRI. U/S abd ok. On lyrica   Chronic atrial fibrillation (HCC)    s/p DCCV 01/2016, subsequent reversion, patient denied AAD   Chronic insomnia    CKD (chronic kidney disease), stage III (HCC)    Colon polyp 10/11   DDD (degenerative disc disease), lumbar    Dilatation of aorta (HCC)    aortic root is mildly enlarged at 43mm and moderately enlarged ascending aorta at 45mm by echo 03/2020   ED (erectile dysfunction)    GERD (gastroesophageal reflux disease)    Gout    History of kidney stones    History of migraine    Ischemic cardiomyopathy    EF 35-40% with moderate RV dysfunction by echo 03/2020 and unchanged from echo 2018   Kidney stone    uric acid, Dr Isabel Caprice  renal insufficiency   Mitral regurgitation 06/04/2016   Moderate by echo 2020   Mixed hyperlipidemia 01/23/2013   Myocardial infarction (HCC)    OSA on CPAP 07/16/2016   Osteoarthritis    of the knee   TIA (transient ischemic attack)    questionable Mini stroke per patient   Past Surgical History:  Procedure Laterality Date   CARDIOVERSION [name] 01/20/2016   Procedure: CARDIOVERSION;  Surgeon: Vesta Mixer, MD;  Location[facility] ENDOSCOPY;  Service: Cardiovascular;  Laterality: [name];   CARDIOVERSION [name] 03/23/2016   Procedure: CARDIOVERSION;  Surgeon: Quintella Reichert, MD;  Location: [facility] ENDOSCOPY;  Service: Cardiovascular;  Laterality: [name];   CARDIOVERSION     CATARACT EXTRACTION W/PHACO Right 10/26/2014   Procedure: CATARACT EXTRACTION PHACO AND INTRAOCULAR LENS PLACEMENT (IOC);  Surgeon: Galen Manila, MD;  Location: [facility] ORS;  Service: Ophthalmology;  Laterality: Right;  LOT PACK: [id number] H US:01:43.0 AP:27.8 CDE:28.68   CATARACT EXTRACTION W/PHACO Left 11/13/2016   Procedure: CATARACT EXTRACTION PHACO AND INTRAOCULAR LENS PLACEMENT (IOC);  Surgeon: Galen Manila, MD;  Location: [facility] ORS;  Service: Ophthalmology;  Laterality: Left;  [place] 01:00.2 AP% 15.7 CDE 9.47 Fluid Pack lot #  [id number]   COLONOSCOPY WITH PROPOFOL [name] 09/27/2015   Procedure: COLONOSCOPY WITH PROPOFOL;  Surgeon: Charolett Bumpers, MD;  Location: [facility] ENDOSCOPY;  Service: Endoscopy;  Laterality: [name];   CORONARY ARTERY BYPASS GRAFT  04/2011   Severe multivessel ASCAD s/p Stemi w PTCA of OM2 then s/p CABG w LIMA to LAD,SVG to OM1 /RI and SVG to Distal RCA    CYSTOSCOPY/URETEROSCOPY/HOLMIUM LASER/STENT PLACEMENT Left 12/21/2019   Procedure: CYSTOSCOPY/RETROGREADE PYLEOGRAM/URETEROSCOPY/HOLMIUM LASER/STENT PLACEMENT;  Surgeon: Jannifer Hick, MD;  Location: [facility] ORS;  Service: Urology;  Laterality: Left;  ONLY NEEDS 60  MIN   FINGER SURGERY     left finger for ganglion cyst   KIDNEY STONE SURGERY     LEFT HEART CATH AND CORS/GRAFTS ANGIOGRAPHY [name] 12/02/2018   Procedure: LEFT HEART CATH AND CORS/GRAFTS ANGIOGRAPHY;  Surgeon: Lyn Records, MD;  Location: [facility] INVASIVE CV LAB;  Service: Cardiovascular;  Laterality: [name];   TONSILLECTOMY      Allergies  Allergen Reactions   Atorvastatin     Joint pain on 80mg  daily   Celebrex [Celecoxib]     Upset stomach   Entresto [Sacubitril-Valsartan]     Low BP/difficulty talking/migraine    Outpatient Encounter Medications as of 05/29/2022  Medication Sig   acetaminophen (TYLENOL) 325 MG tablet Take 650 mg by mouth 4 (four) times daily.   albuterol (PROVENTIL) (2.5 MG/3ML) 0.083% nebulizer solution Take 2.5 mg by nebulization every 6 (six) hours as needed for wheezing or shortness of breath.   allopurinol (ZYLOPRIM) 100 MG tablet Take 100 mg by mouth daily.   escitalopram (LEXAPRO) 10 MG tablet Take 10 mg by mouth daily.   metoprolol tartrate (LOPRESSOR) 25 MG tablet Take 12.5 mg by mouth 2 (two) times daily.   mometasone (NASONEX) 50 MCG/ACT nasal spray Place 2 sprays into the nose daily as needed (congestion).   nitroGLYCERIN (NITROSTAT) 0.4 MG SL tablet Place 1 tablet (0.4 mg total) under the tongue every 5 (five) minutes as needed for chest pain.   pantoprazole  (PROTONIX) 40 MG tablet Take 40 mg by mouth daily.   polyethylene glycol (MIRALAX / GLYCOLAX) 17 g packet Take 17 g by mouth daily as needed.   pregabalin (LYRICA) 50 MG capsule Take 50 mg by mouth 3 (three) times daily.   Rivaroxaban (XARELTO) 15 MG TABS tablet Take 15 mg by mouth daily with supper.   rosuvastatin (CRESTOR) 20 MG tablet Take 20 mg by mouth daily.   simethicone (MYLICON) 80 MG chewable tablet Chew 80 mg by mouth every 8 (eight) hours as needed for flatulence.   VASCEPA 1 g capsule Take 2 capsules (2 g total) by mouth 2 (two) times daily.   Zinc Oxide (TRIPLE PASTE) 12.8 % ointment Apply 1 Application topically. To buttocks every shift.   ALPRAZolam (XANAX) 1 MG tablet Take 1 tablet (1 mg total) by mouth at bedtime.   Butalbital-APAP-Caffeine 50-325-40 MG capsule Take  1 capsule by mouth every 12 (twelve) hours as needed for migraine.   ondansetron (ZOFRAN-ODT) 4 MG disintegrating tablet Take 4 mg by mouth every 6 (six) hours as needed for nausea or vomiting.   No facility-administered encounter medications on file as of 05/29/2022.    Review of Systems ***  Immunization History  Administered Date(s) Administered   Influenza, High Dose Seasonal PF 08/23/2018   Influenza-Unspecified 08/23/2018   Moderna Covid-19 Vaccine Bivalent Booster 54yrs & up 09/19/2020   PFIZER(Purple Top)SARS-COV-2 Vaccination 01/21/2019, 02/11/2019, 09/08/2019   Zoster Recombinat (Shingrix) 09/30/2017, 01/08/2018   Pertinent  Health Maintenance Due  Topic Date Due   INFLUENZA VACCINE  08/02/2022   Colonoscopy  Discontinued      01/19/2021    3:46 PM 01/20/2021    4:13 AM 01/20/2021   12:15 PM 01/20/2021    8:29 PM 01/21/2021    8:00 AM  Fall Risk  (RETIRED) Patient Fall Risk Level Low fall risk Low fall risk Moderate fall risk Moderate fall risk Moderate fall risk   Functional Status Survey:    Vitals:   05/29/22 1421  BP: 130/80  Pulse: 88  Resp: 14  Temp: 98.2 F (36.8 C)  SpO2: 96%   Weight: 180 lb 3.2 oz (81.7 kg)  Height: 5\' 9"  (1.753 m)   Body mass index is 26.61 kg/m. Physical Exam***  Labs reviewed: No results for input(s): "NA", "K", "CL", "CO2", "GLUCOSE", "BUN", "CREATININE", "CALCIUM", "MG", "PHOS" in the last 8760 hours. No results for input(s): "AST", "ALT", "ALKPHOS", "BILITOT", "PROT", "ALBUMIN" in the last 8760 hours. No results for input(s): "WBC", "NEUTROABS", "HGB", "HCT", "MCV", "PLT" in the last 8760 hours. No results found for: "TSH" Lab Results  Component Value Date   HGBA1C 6.1 (H) 01/21/2021   Lab Results  Component Value Date   CHOL 132 01/21/2021   HDL 28 (L) 01/21/2021   LDLCALC 73 01/21/2021   TRIG 154 (H) 01/21/2021   CHOLHDL 4.7 01/21/2021    Significant Diagnostic Results in last 30 days:  No results found.  Assessment/Plan 1. Persistent atrial fibrillation (HCC) *** - Rivaroxaban (XARELTO) 15 MG TABS tablet; Take 1 tablet (15 mg total) by mouth daily with supper.  Dispense: 30 tablet; Refill: 0  2. History of cerebellar hemorrhage ***  3. Hypertension, unspecified type ***  4. Chronic obstructive pulmonary disease, unspecified COPD type (HCC) ***  5. Stage 3a chronic kidney disease (HCC) ***  6. Kidney stones ***  7. Coronary artery disease involving native coronary artery of native heart without angina pectoris *** - metoprolol tartrate (LOPRESSOR) 25 MG tablet; Take 0.5 tablets (12.5 mg total) by mouth 2 (two) times daily.  Dispense: 15 tablet; Refill: 0 - nitroGLYCERIN (NITROSTAT) 0.4 MG SL tablet; Place 1 tablet (0.4 mg total) under the tongue every 5 (five) minutes as needed for chest pain.  Dispense: 25 tablet; Refill: 0 - rosuvastatin (CRESTOR) 20 MG tablet; Take 1 tablet (20 mg total) by mouth daily.  Dispense: 30 tablet; Refill: 0  8. Chronic systolic heart failure (HCC) *** - metoprolol tartrate (LOPRESSOR) 25 MG tablet; Take 0.5 tablets (12.5 mg total) by mouth 2 (two) times daily.  Dispense: 15  tablet; Refill: 0  9. OSA on CPAP ***  10. Gastro-esophageal reflux disease without esophagitis *** - pantoprazole (PROTONIX) 40 MG tablet; Take 1 tablet (40 mg total) by mouth daily.  Dispense: 30 tablet; Refill: 0  11. Gout, unspecified cause, unspecified chronicity, unspecified site *** - allopurinol (ZYLOPRIM) 100 MG tablet;  Take 1 tablet (100 mg total) by mouth daily.  Dispense: 30 tablet; Refill: 0  12. Anxiety *** - ALPRAZolam (XANAX) 1 MG tablet; Take 1 tablet (1 mg total) by mouth at bedtime.  Dispense: 15 tablet; Refill: 0 - escitalopram (LEXAPRO) 10 MG tablet; Take 1 tablet (10 mg total) by mouth daily.  Dispense: 30 tablet; Refill: 0  13. Mixed hyperlipidemia *** - VASCEPA 1 g capsule; Take 2 capsules (2 g total) by mouth 2 (two) times daily.  Dispense: 120 capsule; Refill: 0   Rhyli Depaula K. Biagio Borg [facility] [phone]

## 2022-05-30 NOTE — Addendum Note (Signed)
Addended by: Earnestine Mealing on: 05/30/2022 01:40 PM   Modules accepted: Level of Service

## 2022-05-30 NOTE — Addendum Note (Signed)
Addended by: Earnestine Mealing on: 05/30/2022 01:44 PM   Modules accepted: Level of Service

## 2022-06-01 DIAGNOSIS — R310 Gross hematuria: Secondary | ICD-10-CM | POA: Diagnosis not present

## 2022-06-01 DIAGNOSIS — N3000 Acute cystitis without hematuria: Secondary | ICD-10-CM | POA: Diagnosis not present

## 2022-06-01 DIAGNOSIS — K8020 Calculus of gallbladder without cholecystitis without obstruction: Secondary | ICD-10-CM | POA: Diagnosis not present

## 2022-06-01 DIAGNOSIS — N200 Calculus of kidney: Secondary | ICD-10-CM | POA: Diagnosis not present

## 2022-06-01 DIAGNOSIS — R319 Hematuria, unspecified: Secondary | ICD-10-CM | POA: Diagnosis not present

## 2022-06-04 DIAGNOSIS — R531 Weakness: Secondary | ICD-10-CM | POA: Diagnosis not present

## 2022-06-04 DIAGNOSIS — I340 Nonrheumatic mitral (valve) insufficiency: Secondary | ICD-10-CM | POA: Diagnosis not present

## 2022-06-04 DIAGNOSIS — F419 Anxiety disorder, unspecified: Secondary | ICD-10-CM | POA: Diagnosis not present

## 2022-06-04 DIAGNOSIS — N401 Enlarged prostate with lower urinary tract symptoms: Secondary | ICD-10-CM | POA: Diagnosis not present

## 2022-06-04 DIAGNOSIS — I6529 Occlusion and stenosis of unspecified carotid artery: Secondary | ICD-10-CM | POA: Diagnosis not present

## 2022-06-04 DIAGNOSIS — Z7901 Long term (current) use of anticoagulants: Secondary | ICD-10-CM | POA: Diagnosis not present

## 2022-06-04 DIAGNOSIS — Z951 Presence of aortocoronary bypass graft: Secondary | ICD-10-CM | POA: Diagnosis not present

## 2022-06-04 DIAGNOSIS — I69398 Other sequelae of cerebral infarction: Secondary | ICD-10-CM | POA: Diagnosis not present

## 2022-06-04 DIAGNOSIS — G4733 Obstructive sleep apnea (adult) (pediatric): Secondary | ICD-10-CM | POA: Diagnosis not present

## 2022-06-04 DIAGNOSIS — M109 Gout, unspecified: Secondary | ICD-10-CM | POA: Diagnosis not present

## 2022-06-04 DIAGNOSIS — I5022 Chronic systolic (congestive) heart failure: Secondary | ICD-10-CM | POA: Diagnosis not present

## 2022-06-04 DIAGNOSIS — I69328 Other speech and language deficits following cerebral infarction: Secondary | ICD-10-CM | POA: Diagnosis not present

## 2022-06-04 DIAGNOSIS — Z8673 Personal history of transient ischemic attack (TIA), and cerebral infarction without residual deficits: Secondary | ICD-10-CM | POA: Diagnosis not present

## 2022-06-04 DIAGNOSIS — I4819 Other persistent atrial fibrillation: Secondary | ICD-10-CM | POA: Diagnosis not present

## 2022-06-04 DIAGNOSIS — I130 Hypertensive heart and chronic kidney disease with heart failure and stage 1 through stage 4 chronic kidney disease, or unspecified chronic kidney disease: Secondary | ICD-10-CM | POA: Diagnosis not present

## 2022-06-04 DIAGNOSIS — I482 Chronic atrial fibrillation, unspecified: Secondary | ICD-10-CM | POA: Diagnosis not present

## 2022-06-04 DIAGNOSIS — R338 Other retention of urine: Secondary | ICD-10-CM | POA: Diagnosis not present

## 2022-06-04 DIAGNOSIS — E782 Mixed hyperlipidemia: Secondary | ICD-10-CM | POA: Diagnosis not present

## 2022-06-04 DIAGNOSIS — M5136 Other intervertebral disc degeneration, lumbar region: Secondary | ICD-10-CM | POA: Diagnosis not present

## 2022-06-04 DIAGNOSIS — K219 Gastro-esophageal reflux disease without esophagitis: Secondary | ICD-10-CM | POA: Diagnosis not present

## 2022-06-04 DIAGNOSIS — I2510 Atherosclerotic heart disease of native coronary artery without angina pectoris: Secondary | ICD-10-CM | POA: Diagnosis not present

## 2022-06-04 DIAGNOSIS — J449 Chronic obstructive pulmonary disease, unspecified: Secondary | ICD-10-CM | POA: Diagnosis not present

## 2022-06-04 DIAGNOSIS — M1710 Unilateral primary osteoarthritis, unspecified knee: Secondary | ICD-10-CM | POA: Diagnosis not present

## 2022-06-04 DIAGNOSIS — N1831 Chronic kidney disease, stage 3a: Secondary | ICD-10-CM | POA: Diagnosis not present

## 2022-06-05 ENCOUNTER — Other Ambulatory Visit: Payer: Self-pay | Admitting: Nurse Practitioner

## 2022-06-05 DIAGNOSIS — I252 Old myocardial infarction: Secondary | ICD-10-CM | POA: Diagnosis not present

## 2022-06-05 DIAGNOSIS — N200 Calculus of kidney: Secondary | ICD-10-CM | POA: Diagnosis not present

## 2022-06-05 DIAGNOSIS — N1831 Chronic kidney disease, stage 3a: Secondary | ICD-10-CM | POA: Diagnosis not present

## 2022-06-05 DIAGNOSIS — D6869 Other thrombophilia: Secondary | ICD-10-CM | POA: Diagnosis not present

## 2022-06-05 DIAGNOSIS — N400 Enlarged prostate without lower urinary tract symptoms: Secondary | ICD-10-CM | POA: Diagnosis not present

## 2022-06-05 DIAGNOSIS — I712 Thoracic aortic aneurysm, without rupture, unspecified: Secondary | ICD-10-CM | POA: Diagnosis not present

## 2022-06-05 DIAGNOSIS — I5022 Chronic systolic (congestive) heart failure: Secondary | ICD-10-CM

## 2022-06-05 DIAGNOSIS — R269 Unspecified abnormalities of gait and mobility: Secondary | ICD-10-CM | POA: Diagnosis not present

## 2022-06-05 DIAGNOSIS — I2510 Atherosclerotic heart disease of native coronary artery without angina pectoris: Secondary | ICD-10-CM

## 2022-06-05 DIAGNOSIS — I4819 Other persistent atrial fibrillation: Secondary | ICD-10-CM | POA: Diagnosis not present

## 2022-06-05 DIAGNOSIS — R310 Gross hematuria: Secondary | ICD-10-CM | POA: Diagnosis not present

## 2022-06-05 DIAGNOSIS — I6930 Unspecified sequelae of cerebral infarction: Secondary | ICD-10-CM | POA: Diagnosis not present

## 2022-06-05 DIAGNOSIS — I10 Essential (primary) hypertension: Secondary | ICD-10-CM | POA: Diagnosis not present

## 2022-06-06 ENCOUNTER — Telehealth: Payer: Self-pay | Admitting: Cardiology

## 2022-06-06 DIAGNOSIS — I6200 Nontraumatic subdural hemorrhage, unspecified: Secondary | ICD-10-CM | POA: Diagnosis not present

## 2022-06-06 DIAGNOSIS — I614 Nontraumatic intracerebral hemorrhage in cerebellum: Secondary | ICD-10-CM | POA: Diagnosis not present

## 2022-06-06 NOTE — Telephone Encounter (Signed)
Rescheduled Watchman consult to 07/12/2022 with Dr. Lalla Brothers. The patient was grateful for call and agreed with plan.

## 2022-06-06 NOTE — Telephone Encounter (Signed)
Patient c/o Palpitations:  High priority if patient c/o lightheadedness, shortness of breath, or chest pain  How long have you had palpitations/irregular HR/ Afib? Are you having the symptoms now? Palpitations today and lasted a few mins   Are you currently experiencing lightheadedness, SOB or CP? No  Do you have a history of afib (atrial fibrillation) or irregular heart rhythm? Afib  Have you checked your BP or HR? (document readings if available): No  Are you experiencing any other symptoms? No

## 2022-06-07 ENCOUNTER — Encounter (HOSPITAL_COMMUNITY): Payer: Self-pay | Admitting: Internal Medicine

## 2022-06-07 ENCOUNTER — Other Ambulatory Visit: Payer: Self-pay | Admitting: Nurse Practitioner

## 2022-06-07 ENCOUNTER — Other Ambulatory Visit (HOSPITAL_COMMUNITY): Payer: Self-pay | Admitting: *Deleted

## 2022-06-07 ENCOUNTER — Ambulatory Visit (HOSPITAL_COMMUNITY)
Admission: RE | Admit: 2022-06-07 | Discharge: 2022-06-07 | Disposition: A | Discharge status: Home / Self Care | Payer: Medicare Other | Source: Ambulatory Visit | Attending: Internal Medicine | Admitting: Internal Medicine

## 2022-06-07 VITALS — BP 142/84 | HR 90 | Ht 69.0 in | Wt 198.0 lb

## 2022-06-07 DIAGNOSIS — I4819 Other persistent atrial fibrillation: Secondary | ICD-10-CM

## 2022-06-07 DIAGNOSIS — D6869 Other thrombophilia: Secondary | ICD-10-CM | POA: Insufficient documentation

## 2022-06-07 DIAGNOSIS — I712 Thoracic aortic aneurysm, without rupture, unspecified: Secondary | ICD-10-CM | POA: Diagnosis not present

## 2022-06-07 DIAGNOSIS — N1831 Chronic kidney disease, stage 3a: Secondary | ICD-10-CM | POA: Insufficient documentation

## 2022-06-07 DIAGNOSIS — I5022 Chronic systolic (congestive) heart failure: Secondary | ICD-10-CM | POA: Insufficient documentation

## 2022-06-07 DIAGNOSIS — Z8673 Personal history of transient ischemic attack (TIA), and cerebral infarction without residual deficits: Secondary | ICD-10-CM | POA: Insufficient documentation

## 2022-06-07 DIAGNOSIS — F419 Anxiety disorder, unspecified: Secondary | ICD-10-CM

## 2022-06-07 DIAGNOSIS — I4821 Permanent atrial fibrillation: Secondary | ICD-10-CM | POA: Insufficient documentation

## 2022-06-07 DIAGNOSIS — Z951 Presence of aortocoronary bypass graft: Secondary | ICD-10-CM | POA: Insufficient documentation

## 2022-06-07 DIAGNOSIS — Z79899 Other long term (current) drug therapy: Secondary | ICD-10-CM | POA: Insufficient documentation

## 2022-06-07 DIAGNOSIS — I129 Hypertensive chronic kidney disease with stage 1 through stage 4 chronic kidney disease, or unspecified chronic kidney disease: Secondary | ICD-10-CM | POA: Insufficient documentation

## 2022-06-07 DIAGNOSIS — Z7901 Long term (current) use of anticoagulants: Secondary | ICD-10-CM | POA: Insufficient documentation

## 2022-06-07 DIAGNOSIS — I2510 Atherosclerotic heart disease of native coronary artery without angina pectoris: Secondary | ICD-10-CM | POA: Insufficient documentation

## 2022-06-07 MED ORDER — METOPROLOL TARTRATE 25 MG PO TABS
12.5000 mg | ORAL_TABLET | Freq: Two times a day (BID) | ORAL | 3 refills | Status: DC
Start: 2022-06-07 — End: 2022-06-19

## 2022-06-07 NOTE — Telephone Encounter (Signed)
Patient's step daughter Trudie Buckler [facility]) calling back to discuss patient's recent episode of palpitations.Heidi states patient was recently in hospital w/ CVA, then was discharged from rehab. He has upcoming appt w/ Dr. Lalla Brothers for [facility] consult.  Heidi states patient was at rest yesterday and was complaining of palpitations and lightheadedness. She states it passed after 10 mins. She states he has been on reduced dose of lopressor  (12.5 mg BID ) since he was in the hospital due to lower BP's. She states at his office visit w/ PCP Dr. Donette Larry his BP was around 97/65 and HR was around 100. She states he was not experiencing any symptoms at the time of these readings. Heidi is requesting a refill of his lopressor dose but also wondering if his dose needs to be changed.  I did offer Heidi an appt w/ APP on 06/12/22, but she states patient is going out of town on a family trip and will not be back until 06/19/22. She states she has tried to dissuade him from travel but patient is insistent on going. Booked pt an appt w/ APP for 06/19/22, forwarded to Dr. Mayford Knife for advice about lopressor refill/dose.

## 2022-06-07 NOTE — Telephone Encounter (Signed)
Called regarding patient's concerns about palpitations, no answer, left msg per DPR. Gave direct # for callback.

## 2022-06-07 NOTE — Progress Notes (Signed)
Primary Care Physician: Georgann Housekeeper, MD Primary Cardiologist: Dr. Mayford Knife Primary Electrophysiologist: None Referring Physician: Dr. Estill Bamberg is a 77 y.o. male with a history of CVA, HTN, BPH, CAD s/p CABG in 2013, CKD stage 3a, thoracic aortic aneurysm, and rate controlled atrial fibrillation who presents for consultation in the [facility]. Review of records demonstrate patient has been in permanent atrial fibrillation since 2018. The patient recently presented to [facility] 04/09/22 with headache, dizziness, and mildly slurred speech found to have left cerebellar hemorrhage. He has upcoming appt. with Dr. Lalla Brothers to discuss Watchman procedure. Patient complained of palpitations and lightheadedness on 6/5 (yesterday) which resolved after 10 minutes. He has been on Lopressor 12.5 mg BID since hospital due to lower BP readings; Dr. Mayford Knife recommended holding until further evaluation. Patient is on Xarelto 15 mg daily for a CHADS2VASC score of 6.  On evaluation today, he is currently in rate controlled Afib. He noted episode yesterday of palpitations that lasted for a few minutes and admitted he was nervous/anxious at the time it happened. He did not check BP/HR. Patient was in [facility] rehab for 3 weeks after discharge from [facility]. He has been back home for the past week. He notes the several times his BP was checked at home it was 130/80s. Sister with patient today notes that while in [facility] hospital for cerebellar hemorrhage his BP at times was 90/60. She does not recall his BPs while in [facility] rehab. Patient notes overall he feels fine when in Afib and can do all his normal activities.  He is compliant with anticoagulation and has not missed any doses. He has no bleeding concerns.  Today, he denies symptoms of palpitations, chest pain, shortness of breath, orthopnea, PND, lower extremity edema, dizziness, presyncope, syncope, snoring, daytime  somnolence, bleeding, or neurologic sequela. The patient is tolerating medications without difficulties and is otherwise without complaint today.    he has a BMI of Body mass index is 29.24 kg/m.[name] Filed Weights   06/07/22 1536  Weight: 89.8 kg    Family History  Problem Relation Age of Onset   Alzheimer's disease Mother    Cancer Mother    CVA Father    Arrhythmia Father    Arrhythmia Sister     Atrial Fibrillation Management history:  Previous antiarrhythmic drugs: None Previous cardioversions: 01/20/16, 03/23/16 Previous ablations: None Anticoagulation history: Xarelto 15 mg   Past Medical History:  Diagnosis Date   Benign essential HTN 11/24/2013   BPH (benign prostatic hyperplasia)    patient unaware   CAD (coronary artery disease), native coronary artery 04/2011   S/P CAB[place]   Carotid artery stenosis    1-39% bilateral by dopplers 2018   Chest wall pain    Right side, muscular,ortho,MRI. U/S abd ok. On lyrica   Chronic atrial fibrillation (HCC)    s/p DCCV 01/2016, subsequent reversion, patient denied AAD   Chronic insomnia    CKD (chronic kidney disease), stage III (HCC)    Colon polyp 10/11   DDD (degenerative disc disease), lumbar    Dilatation of aorta (HCC)    aortic root is mildly enlarged at 43mm and moderately enlarged ascending aorta at 45mm by echo 03/2020   ED (erectile dysfunction)    GERD (gastroesophageal reflux disease)    Gout    History of kidney stones    History of migraine    Ischemic cardiomyopathy  EF 35-40% with moderate RV dysfunction by echo 03/2020 and unchanged from echo 2018   Kidney stone    uric acid, Dr Isabel Caprice  renal insufficiency   Mitral regurgitation 06/04/2016   Moderate by echo 2020   Mixed hyperlipidemia 01/23/2013   Myocardial infarction (HCC)    OSA on CPAP 07/16/2016   Osteoarthritis    of the knee   TIA (transient ischemic attack)    questionable Mini stroke per patient   Past Surgical  History:  Procedure Laterality Date   CARDIOVERSION [name] 01/20/2016   Procedure: CARDIOVERSION;  Surgeon: Vesta Mixer, MD;  Location: [facility] ENDOSCOPY;  Service: Cardiovascular;  Laterality: [name];   CARDIOVERSION [name] 03/23/2016   Procedure: CARDIOVERSION;  Surgeon: Quintella Reichert, MD;  Location: [facility] ENDOSCOPY;  Service: Cardiovascular;  Laterality: [name];   CARDIOVERSION     CATARACT EXTRACTION W/PHACO Right 10/26/2014   Procedure: CATARACT EXTRACTION PHACO AND INTRAOCULAR LENS PLACEMENT (IOC);  Surgeon: Galen Manila, MD;  Location: [facility] ORS;  Service: Ophthalmology;  Laterality: Right;  LOT PACK: [id number] H US:01:43.0 AP:27.8 CDE:28.68   CATARACT EXTRACTION W/PHACO Left 11/13/2016   Procedure: CATARACT EXTRACTION PHACO AND INTRAOCULAR LENS PLACEMENT (IOC);  Surgeon: Galen Manila, MD;  Location: [facility] ORS;  Service: Ophthalmology;  Laterality: Left;  [place] 01:00.2 AP% 15.7 CDE 9.47 Fluid Pack lot # [id number]   COLONOSCOPY WITH PROPOFOL [name] 09/27/2015   Procedure: COLONOSCOPY WITH PROPOFOL;  Surgeon: Charolett Bumpers, MD;  Location: [facility] ENDOSCOPY;  Service: Endoscopy;  Laterality: [name];   CORONARY ARTERY BYPASS GRAFT  04/2011   Severe multivessel ASCAD s/p Stemi w PTCA of OM2 then s/p CABG w LIMA to LAD,SVG to OM1 /RI and SVG to Distal RCA    CYSTOSCOPY/URETEROSCOPY/HOLMIUM LASER/STENT PLACEMENT Left 12/21/2019   Procedure: CYSTOSCOPY/RETROGREADE PYLEOGRAM/URETEROSCOPY/HOLMIUM LASER/STENT PLACEMENT;  Surgeon: Jannifer Hick, MD;  Location: [facility] ORS;  Service: Urology;  Laterality: Left;  ONLY NEEDS 60  MIN   FINGER SURGERY     left finger for ganglion cyst   KIDNEY STONE SURGERY     LEFT HEART CATH AND CORS/GRAFTS ANGIOGRAPHY [name] 12/02/2018   Procedure: LEFT HEART CATH AND CORS/GRAFTS ANGIOGRAPHY;  Surgeon: Lyn Records, MD;  Location: [facility] INVASIVE CV LAB;  Service: Cardiovascular;  Laterality: [name];   TONSILLECTOMY      Current Outpatient Medications  Medication Sig Dispense Refill    acetaminophen (TYLENOL) 325 MG tablet Take 650 mg by mouth as needed.     albuterol (PROVENTIL) (2.5 MG/3ML) 0.083% nebulizer solution Take 3 mLs (2.5 mg total) by nebulization every 6 (six) hours as needed for wheezing or shortness of breath. 75 mL 0   allopurinol (ZYLOPRIM) 100 MG tablet Take 1 tablet (100 mg total) by mouth daily. 30 tablet 0   ALPRAZolam (XANAX) 1 MG tablet Take 1 tablet (1 mg total) by mouth at bedtime. 15 tablet 0   escitalopram (LEXAPRO) 10 MG tablet Take 1 tablet (10 mg total) by mouth daily. 30 tablet 0   mometasone (NASONEX) 50 MCG/ACT nasal spray Place 2 sprays into the nose daily as needed (congestion). 1 each 0   nitroGLYCERIN (NITROSTAT) 0.4 MG SL tablet Place 1 tablet (0.4 mg total) under the tongue every 5 (five) minutes as needed for chest pain. 25 tablet 0   ondansetron (ZOFRAN-ODT) 4 MG disintegrating tablet Take 1 tablet (4 mg total) by mouth every 6 (six) hours as needed for nausea or vomiting. 20 tablet 0   pantoprazole (PROTONIX) 40 MG tablet Take 1 tablet (40 mg  total) by mouth daily. 30 tablet 0   polyethylene glycol (MIRALAX / GLYCOLAX) 17 g packet Take 17 g by mouth daily as needed. 30 each 0   Rivaroxaban (XARELTO) 15 MG TABS tablet Take 1 tablet (15 mg total) by mouth daily with supper. 30 tablet 0   rosuvastatin (CRESTOR) 20 MG tablet Take 1 tablet (20 mg total) by mouth daily. 30 tablet 0   simethicone (MYLICON) 80 MG chewable tablet Chew 80 mg by mouth every 8 (eight) hours as needed for flatulence.     VASCEPA 1 g capsule Take 2 capsules (2 g total) by mouth 2 (two) times daily. 120 capsule 0   Butalbital-APAP-Caffeine 50-325-40 MG capsule Take 1 capsule by mouth every 12 (twelve) hours as needed for migraine. (Patient not taking: Reported on 06/07/2022)     metoprolol tartrate (LOPRESSOR) 25 MG tablet Take 0.5 tablets (12.5 mg total) by mouth 2 (two) times daily. 30 tablet 3   pregabalin (LYRICA) 50 MG capsule Take 1 capsule (50 mg total) by mouth 3  (three) times daily. (Patient not taking: Reported on 06/07/2022) 90 capsule 0   Zinc Oxide (TRIPLE PASTE) 12.8 % ointment Apply 1 Application topically. To buttocks every shift. (Patient not taking: Reported on 06/07/2022)     No current facility-administered medications for this encounter.    Allergies  Allergen Reactions   Atorvastatin     Joint pain on 80mg  daily   Celebrex [Celecoxib]     Upset stomach   Entresto [Sacubitril-Valsartan]     Low BP/difficulty talking/migraine    Social History   Socioeconomic History   Marital status: Widowed    Spouse name: Not on file   Number of children: Not on file   Years of education: Not on file   Highest education level: Not on file  Occupational History   Not on file  Tobacco Use   Smoking status: Never   Smokeless tobacco: Never  Vaping Use   Vaping Use: Never used  Substance and Sexual Activity   Alcohol use: Not Currently    Comment: yes occassionally   Drug use: No   Sexual activity: Not on file  Other Topics Concern   Not on file  Social History Narrative   Not on file   Social Determinants of Health   Financial Resource Strain: Not on file  Food Insecurity: Not on file  Transportation Needs: Not on file  Physical Activity: Not on file  Stress: Not on file  Social Connections: Not on file  Intimate Partner Violence: Not on file    ROS- All systems are reviewed and negative except as per the HPI above.  Physical Exam: Vitals:   06/07/22 1536  BP: (!) 142/84  Pulse: 90  Weight: 89.8 kg  Height: 5\' 9"  (1.753 m)    GEN- The patient is a well appearing male, alert and oriented x 3 today.   Head- normocephalic, atraumatic Eyes-  Sclera clear, conjunctiva pink Ears- hearing intact Oropharynx- clear Neck- supple  Lungs- Clear to ausculation bilaterally, normal work of breathing Heart- Irregular rate and rhythm, no murmurs, rubs or gallops  GI- soft, NT, ND, + BS Extremities- no clubbing, cyanosis, or  edema MS- no significant deformity or atrophy Skin- no rash or lesion Psych- euthymic mood, full affect Neuro- strength and sensation are intact  Wt Readings from Last 3 Encounters:  06/07/22 89.8 kg  05/29/22 81.7 kg  05/07/22 81.4 kg    EKG today demonstrates  Vent. rate 90  BPM PR interval * ms QRS duration 90 ms QT/QTcB 360/440 ms P-R-T axes * 19 38 Atrial fibrillation Abnormal ECG When compared with ECG of 19-Jan-2021 15:53, PREVIOUS ECG IS PRESENT  Echo 01/20/21 demonstrated: 1. Left ventricular ejection fraction, by estimation, is 40 to 45%. The  left ventricle has mildly decreased function. The left ventricle  demonstrates global hypokinesis. Left ventricular diastolic parameters are  indeterminate.   2. Right ventricular systolic function is normal. The right ventricular  size is normal. Tricuspid regurgitation signal is inadequate for assessing  PA pressure.   3. Left atrial size was severely dilated.   4. Right atrial size was mildly dilated.   5. The mitral valve is grossly normal. No evidence of mitral valve  regurgitation.   6. The aortic valve is grossly normal. Aortic valve regurgitation is not  visualized.   7. Aortic ascending aortic aneurysm 4.2 cm.   8. The inferior vena cava is normal in size with greater than 50%  respiratory variability, suggesting right atrial pressure of 3 mmHg.   Epic records are reviewed at length today.  CHA2DS2-VASc Score = 6  The patient's score is based upon: CHF History: 0 HTN History: 1 Diabetes History: 0 Stroke History: 2 Vascular Disease History: 1 Age Score: 2 Gender Score: 0       ASSESSMENT AND PLAN: Permanent Atrial Fibrillation (ICD10:  I48.11) The patient's CHA2DS2-VASc score is 6, indicating a 9.7% annual risk of stroke.    He is in rate controlled Afib today. Patient was aware he has been in Afib for the past several years. Sister today was aware of this. She believes patient's step daughter is not  aware he has been in Afib for the last several years. They will check his BP daily and advised to continue with Lopressor 12.5 mg BID as long as HR > 100 and systolic BP > 100.   Refill sent to pharmacy.  Secondary Hypercoagulable State (ICD10:  D68.69) The patient is at significant risk for stroke/thromboembolism based upon his CHA2DS2-VASc Score of 6.  Continue Rivaroxaban (Xarelto).   Outside labs show Creatinine 1.63 with estimate CrCl 45 mL/min. Continue Xarelto 15 mg daily.    Follow up as scheduled with Dr. Lalla Brothers to discuss Watchman procedure.    Lake Bells, PA-C Afib Clinic [facility] [address] [place], [place] [address] [phone] 06/07/2022 4:12 PM

## 2022-06-08 ENCOUNTER — Telehealth: Payer: Self-pay | Admitting: Cardiology

## 2022-06-08 DIAGNOSIS — I69398 Other sequelae of cerebral infarction: Secondary | ICD-10-CM | POA: Diagnosis not present

## 2022-06-08 DIAGNOSIS — D6869 Other thrombophilia: Secondary | ICD-10-CM | POA: Insufficient documentation

## 2022-06-08 DIAGNOSIS — I4819 Other persistent atrial fibrillation: Secondary | ICD-10-CM | POA: Diagnosis not present

## 2022-06-08 DIAGNOSIS — N200 Calculus of kidney: Secondary | ICD-10-CM | POA: Diagnosis not present

## 2022-06-08 DIAGNOSIS — I130 Hypertensive heart and chronic kidney disease with heart failure and stage 1 through stage 4 chronic kidney disease, or unspecified chronic kidney disease: Secondary | ICD-10-CM | POA: Diagnosis not present

## 2022-06-08 DIAGNOSIS — R310 Gross hematuria: Secondary | ICD-10-CM | POA: Diagnosis not present

## 2022-06-08 DIAGNOSIS — I69328 Other speech and language deficits following cerebral infarction: Secondary | ICD-10-CM | POA: Diagnosis not present

## 2022-06-08 DIAGNOSIS — N281 Cyst of kidney, acquired: Secondary | ICD-10-CM | POA: Diagnosis not present

## 2022-06-08 DIAGNOSIS — R531 Weakness: Secondary | ICD-10-CM | POA: Diagnosis not present

## 2022-06-08 DIAGNOSIS — R8289 Other abnormal findings on cytological and histological examination of urine: Secondary | ICD-10-CM | POA: Diagnosis not present

## 2022-06-08 DIAGNOSIS — N401 Enlarged prostate with lower urinary tract symptoms: Secondary | ICD-10-CM | POA: Diagnosis not present

## 2022-06-08 DIAGNOSIS — I5022 Chronic systolic (congestive) heart failure: Secondary | ICD-10-CM | POA: Diagnosis not present

## 2022-06-08 NOTE — Telephone Encounter (Signed)
BP is elevated from when home therapist. Calling to see what can be done. Please advise

## 2022-06-08 NOTE — Telephone Encounter (Signed)
Called Heidi back and reviewed Dr. Norris Cross advice to please have patient check BP twice daily for a week and call with results. Heidi verbalizes understanding.

## 2022-06-08 NOTE — Telephone Encounter (Signed)
Patient's dtr Heidi (DPR) calling in, states that while yesterday's afib clinic visit seems to have been helpful, today patient had a BP of 134/100 while working with home health PT. Because of this, PT stated they could not work with patient. Heidi states she checked BP with home monitor after the visit and got a similar reading. Heidi states patient did not complain of any headache or blurry vision.  Patient was advised at afib clinic yesterday to take his metoprolol only when HR and SBP are greater than 100 due to recent episode of dizziness/lightheadedness. Heidi states PT was ordered by [facility] physician Dr. Hope Pigeon (Physical medicine and rehabilitation). She is asking what she can do to ensure patient can work with PT so he can continue to address his deficits post stroke.   Called and left a message at Dr. Staci Righter office asking them to contact us regarding this issue.

## 2022-06-11 NOTE — Telephone Encounter (Signed)
Spoke with Dr. Ether Griffins, he has not seen the patient since January of 2023 however, pt does have f/u visit on 06/29/22. Explained pt symptoms and Dr. Malachy Mood recommendation, Dr Ether Griffins was appreciative for the update.

## 2022-06-11 NOTE — Telephone Encounter (Signed)
Dr. Deeann Saint returned call. Informed him Alcario Drought, RN is gone until next week. He states the message told him to c/b asap. Please advise.

## 2022-06-17 ENCOUNTER — Telehealth: Payer: Self-pay | Admitting: Internal Medicine

## 2022-06-17 ENCOUNTER — Telehealth: Payer: Self-pay | Admitting: Cardiology

## 2022-06-17 NOTE — Telephone Encounter (Signed)
Patient's daughter contacted the after hours line to discuss Sean Willis most recent blood pressure.  Patient's blood pressure was 144/103 mmHg.  He was informed to increase his metoprolol earlier for which he did.  He has follow up with cardiology in 2 days.  Informed family if blood pressure remains consistently >170/100 mmHg, he should report to the emergency department.

## 2022-06-17 NOTE — Telephone Encounter (Signed)
Patient's daughter called in reporting pt BP has been elevated the past 2 days. He had an admission to [facility] back 04/2022 with hemorraghic stroke. During that admission BPs were soft and meds stopped. He was cleared to resume Xarelto. Was most recently seen in afib clinic 6/6 and BP elevated. Resumed on metoprolol 12.5mg  BID. Reports yesterday and today BP up into the 170s systolic, HRs in the 90s. Has had a mild headache, but otherwise neurologically intact per family. Initial presenting symptoms with stroke, were confusion/disorientation. I advised we should increase metoprolol to 37.5mg  BID, but would have a low threshold to been seen in the ED if BP does not improve or symptoms do not resolve or worsen. Daughter voiced understanding and thanked me for callback.

## 2022-06-18 ENCOUNTER — Ambulatory Visit: Payer: Self-pay | Admitting: *Deleted

## 2022-06-18 NOTE — Telephone Encounter (Signed)
  Chief Complaint: daughter in law on DPR reports elevated BP not with patient now . Symptoms: slight headache , sleeping a lot. BP running 140's/100's. Per daughter in law , MD recommended to increase dose of metoprolol and BP remains elevated.  Frequency: na  Pertinent Negatives: Patient denies weakness on either side of body no slurred speech no blurred vision no N/T either side of body.  Disposition: [] ED /[] Urgent Care (no appt availability in office) / [] Appointment(In office/virtual)/ []  [facility] [facility]/ [] Home Care/ [] Refused Recommended Disposition /[] [facility]/ [x]  Follow-up with PCP Additional Notes:   Recommended to call patient's PCP and or neurologist regarding elevated BP and medication adjustments. Recommended if sx worsen or additional sx noted go to ED and if worsen call 911.     Reason for Disposition  Systolic BP  >= 180 OR Diastolic >= 110  Answer Assessment - Initial Assessment Questions 1. BLOOD PRESSURE: "What is the blood pressure?" "Did you take at least two measurements 5 minutes apart?"     Caller on DPR was not with patient 2. ONSET: "When did you take your blood pressure?"     Today  3. HOW: "How did you take your blood pressure?" (e.g., automatic home BP monitor, visiting nurse)     Home monitor  4. HISTORY: "Do you have a history of high blood pressure?"     Hx stroke 10 weeks ago  5. MEDICINES: "Are you taking any medicines for blood pressure?" "Have you missed any doses recently?"     Metoprolol  6. OTHER SYMPTOMS: "Do you have any symptoms?" (e.g., blurred vision, chest pain, difficulty breathing, headache, weakness)    "Slight' headache per daughter in law. Sleeping a lot. BP running 140's/ 100's even after increase of dose of metoprolol 7. PREGNANCY: "Is there any chance you are pregnant?" "When was your last menstrual period?"     na  Protocols used: Blood Pressure - High-A-AH

## 2022-06-19 ENCOUNTER — Encounter: Payer: Self-pay | Admitting: Physician Assistant

## 2022-06-19 ENCOUNTER — Ambulatory Visit: Payer: Medicare Other | Admitting: Cardiovascular Disease

## 2022-06-19 ENCOUNTER — Ambulatory Visit: Payer: Medicare Other | Attending: Physician Assistant | Admitting: Physician Assistant

## 2022-06-19 VITALS — BP 128/104 | HR 106 | Ht 69.5 in | Wt 198.8 lb

## 2022-06-19 DIAGNOSIS — E782 Mixed hyperlipidemia: Secondary | ICD-10-CM | POA: Insufficient documentation

## 2022-06-19 DIAGNOSIS — I4821 Permanent atrial fibrillation: Secondary | ICD-10-CM | POA: Diagnosis not present

## 2022-06-19 DIAGNOSIS — I482 Chronic atrial fibrillation, unspecified: Secondary | ICD-10-CM | POA: Diagnosis not present

## 2022-06-19 DIAGNOSIS — I77810 Thoracic aortic ectasia: Secondary | ICD-10-CM | POA: Diagnosis not present

## 2022-06-19 DIAGNOSIS — D6869 Other thrombophilia: Secondary | ICD-10-CM | POA: Insufficient documentation

## 2022-06-19 MED ORDER — METOPROLOL TARTRATE 50 MG PO TABS: 50.0000 mg | ORAL_TABLET | Freq: Two times a day (BID) | ORAL | 3 refills | Status: DC

## 2022-06-19 NOTE — Patient Instructions (Addendum)
Medication Instructions:  1.Increase metoprolol tartrate (Lopressor) to 50 mg twice daily, may take an extra 12.5 mg as needed. *If you need a refill on your cardiac medications before your next appointment, please call your pharmacy*  Lab Work: FASTING LIPIDS, LFT'S, CBC, BMET, MAG IN 1-2 WEEKS If you have labs (blood work) drawn today and your tests are completely normal, you will receive your results only by: MyChart Message (if you have MyChart) OR A paper copy in the mail If you have any lab test that is abnormal or we need to change your treatment, we will call you to review the results.  Follow-Up: At [facility], you and your health needs are our priority.  As part of our continuing mission to provide you with exceptional heart care, we have created designated Provider Care Teams.  These Care Teams include your primary Cardiologist (physician) and Advanced Practice Providers (APPs -  Physician Assistants and Nurse Practitioners) who all work together to provide you with the care you need, when you need it.  Your next appointment:   3-4 month(s)  Provider:   Armanda Magic, MD    Other Instructions Check your blood pressure daily, 1 hr after morning medications for 2 weeks, keep a log and send [place] the readings through mychart at the end of the 2 weeks.   Low-Sodium Eating Plan Salt (sodium) helps you keep a healthy balance of fluids in your body. Too much sodium can raise your blood pressure. It can also cause fluid and waste to be held in your body. Your health care provider or dietitian may recommend a low-sodium eating plan if you have high blood pressure (hypertension), kidney disease, liver disease, or heart failure. Eating less sodium can help lower your blood pressure and reduce swelling. It can also protect your heart, liver, and kidneys. What are tips for following this plan? Reading food labels  Check food labels for the amount of sodium per serving. If you eat  more than one serving, you must multiply the listed amount by the number of servings. Choose foods with less than 140 milligrams (mg) of sodium per serving. Avoid foods with 300 mg of sodium or more per serving. Always check how much sodium is in a product, even if the label says "unsalted" or "no salt added." Shopping  Buy products labeled as "low-sodium" or "no salt added." Buy fresh foods. Avoid canned foods and pre-made or frozen meals. Avoid canned, cured, or processed meats. Buy breads that have less than 80 mg of sodium per slice. Cooking  Eat more home-cooked food. Try to eat less restaurant, buffet, and fast food. Try not to add salt when you cook. Use salt-free seasonings or herbs instead of table salt or sea salt. Check with your provider or pharmacist before using salt substitutes. Cook with plant-based oils, such as canola, sunflower, or olive oil. Meal planning When eating at a restaurant, ask if your food can be made with less salt or no salt. Avoid dishes labeled as brined, pickled, cured, or smoked. Avoid dishes made with soy sauce, miso, or teriyaki sauce. Avoid foods that have monosodium glutamate (MSG) in them. MSG may be added to some restaurant food, sauces, soups, bouillon, and canned foods. Make meals that can be grilled, baked, poached, roasted, or steamed. These are often made with less sodium. General information Try to limit your sodium intake to 1,500-2,300 mg each day, or the amount told by your provider. What foods should I eat? Fruits Fresh,  frozen, or canned fruit. Fruit juice. Vegetables Fresh or frozen vegetables. "No salt added" canned vegetables. "No salt added" tomato sauce and paste. Low-sodium or reduced-sodium tomato and vegetable juice. Grains Low-sodium cereals, such as oats, puffed wheat and rice, and shredded wheat. Low-sodium crackers. Unsalted rice. Unsalted pasta. Low-sodium bread. Whole grain breads and whole grain pasta. Meats and other  proteins Fresh or frozen meat, poultry, seafood, and fish. These should have no added salt. Low-sodium canned tuna and salmon. Unsalted nuts. Dried peas, beans, and lentils without added salt. Unsalted canned beans. Eggs. Unsalted nut butters. Dairy Milk. Soy milk. Cheese that is naturally low in sodium, such as ricotta cheese, fresh mozzarella, or Swiss cheese. Low-sodium or reduced-sodium cheese. Cream cheese. Yogurt. Seasonings and condiments Fresh and dried herbs and spices. Salt-free seasonings. Low-sodium mustard and ketchup. Sodium-free salad dressing. Sodium-free light mayonnaise. Fresh or refrigerated horseradish. Lemon juice. Vinegar. Other foods Homemade, reduced-sodium, or low-sodium soups. Unsalted popcorn and pretzels. Low-salt or salt-free chips. The items listed above may not be all the foods and drinks you can have. Talk to a dietitian to learn more. What foods should I avoid? Vegetables Sauerkraut, pickled vegetables, and relishes. Olives. [place] fries. Onion rings. Regular canned vegetables, except low-sodium or reduced-sodium items. Regular canned tomato sauce and paste. Regular tomato and vegetable juice. Frozen vegetables in sauces. Grains Instant hot cereals. Bread stuffing, pancake, and biscuit mixes. Croutons. Seasoned rice or pasta mixes. Noodle soup cups. Boxed or frozen macaroni and cheese. Regular salted crackers. Self-rising flour. Meats and other proteins Meat or fish that is salted, canned, smoked, spiced, or pickled. Precooked or cured meat, such as sausages or meat loaves. Tomasa Blase. Ham. Pepperoni. Hot dogs. Corned beef. Chipped beef. Salt pork. Jerky. Pickled herring, anchovies, and sardines. Regular canned tuna. Salted nuts. Dairy Processed cheese and cheese spreads. Hard cheeses. Cheese curds. Blue cheese. Feta cheese. String cheese. Regular cottage cheese. Buttermilk. Canned milk. Fats and oils Salted butter. Regular margarine. Ghee. Bacon fat. Seasonings and  condiments Onion salt, garlic salt, seasoned salt, table salt, and sea salt. Canned and packaged gravies. Worcestershire sauce. Tartar sauce. Barbecue sauce. Teriyaki sauce. Soy sauce, including reduced-sodium soy sauce. Steak sauce. Fish sauce. Oyster sauce. Cocktail sauce. Horseradish that you find on the shelf. Regular ketchup and mustard. Meat flavorings and tenderizers. Bouillon cubes. Hot sauce. Pre-made or packaged marinades. Pre-made or packaged taco seasonings. Relishes. Regular salad dressings. Salsa. Other foods Salted popcorn and pretzels. Corn chips and puffs. Potato and tortilla chips. Canned or dried soups. Pizza. Frozen entrees and pot pies. The items listed above may not be all the foods and drinks you should avoid. Talk to a dietitian to learn more. This information is not intended to replace advice given to you by your health care provider. Make sure you discuss any questions you have with your health care provider. Document Revised: 01/04/2022 Document Reviewed: 01/04/2022 Elsevier Patient Education  2024 Elsevier Inc.   Heart-Healthy Eating Plan Many factors influence your heart health, including eating and exercise habits. Heart health is also called coronary health. Coronary risk increases with abnormal blood fat (lipid) levels. A heart-healthy eating plan includes limiting unhealthy fats, increasing healthy fats, limiting salt (sodium) intake, and making other diet and lifestyle changes. What is my plan? Your health care provider may recommend that: You limit your fat intake to _________% or less of your total calories each day. You limit your saturated fat intake to _________% or less of your total calories each day. You limit the  amount of cholesterol in your diet to less than _________ mg per day. You limit the amount of sodium in your diet to less than _________ mg per day. What are tips for following this plan? Cooking Cook foods using methods other than frying.  Baking, boiling, grilling, and broiling are all good options. Other ways to reduce fat include: Removing the skin from poultry. Removing all visible fats from meats. Steaming vegetables in water or broth. Meal planning  At meals, imagine dividing your plate into fourths: Fill one-half of your plate with vegetables and green salads. Fill one-fourth of your plate with whole grains. Fill one-fourth of your plate with lean protein foods. Eat 2-4 cups of vegetables per day. One cup of vegetables equals 1 cup (91 g) broccoli or cauliflower florets, 2 medium carrots, 1 large bell pepper, 1 large sweet potato, 1 large tomato, 1 medium white potato, 2 cups (150 g) raw leafy greens. Eat 1-2 cups of fruit per day. One cup of fruit equals 1 small apple, 1 large banana, 1 cup (237 g) mixed fruit, 1 large orange,  cup (82 g) dried fruit, 1 cup (240 mL) 100% fruit juice. Eat more foods that contain soluble fiber. Examples include apples, broccoli, carrots, beans, peas, and barley. Aim to get 25-30 g of fiber per day. Increase your consumption of legumes, nuts, and seeds to 4-5 servings per week. One serving of dried beans or legumes equals  cup (90 g) cooked, 1 serving of nuts is  oz (12 almonds, 24 pistachios, or 7 walnut halves), and 1 serving of seeds equals  oz (8 g). Fats Choose healthy fats more often. Choose monounsaturated and polyunsaturated fats, such as olive and canola oils, avocado oil, flaxseeds, walnuts, almonds, and seeds. Eat more omega-3 fats. Choose salmon, mackerel, sardines, tuna, flaxseed oil, and ground flaxseeds. Aim to eat fish at least 2 times each week. Check food labels carefully to identify foods with trans fats or high amounts of saturated fat. Limit saturated fats. These are found in animal products, such as meats, butter, and cream. Plant sources of saturated fats include palm oil, palm kernel oil, and coconut oil. Avoid foods with partially hydrogenated oils in them. These  contain trans fats. Examples are stick margarine, some tub margarines, cookies, crackers, and other baked goods. Avoid fried foods. General information Eat more home-cooked food and less restaurant, buffet, and fast food. Limit or avoid alcohol. Limit foods that are high in added sugar and simple starches such as foods made using white refined flour (white breads, pastries, sweets). Lose weight if you are overweight. Losing just 5-10% of your body weight can help your overall health and prevent diseases such as diabetes and heart disease. Monitor your sodium intake, especially if you have high blood pressure. Talk with your health care provider about your sodium intake. Try to incorporate more vegetarian meals weekly. What foods should I eat? Fruits All fresh, canned (in natural juice), or frozen fruits. Vegetables Fresh or frozen vegetables (raw, steamed, roasted, or grilled). Green salads. Grains Most grains. Choose whole wheat and whole grains most of the time. Rice and pasta, including brown rice and pastas made with whole wheat. Meats and other proteins Lean, well-trimmed beef, veal, pork, and lamb. Chicken and [place] without skin. All fish and shellfish. Wild duck, rabbit, pheasant, and venison. Egg whites or low-cholesterol egg substitutes. Dried beans, peas, lentils, and tofu. Seeds and most nuts. Dairy Low-fat or nonfat cheeses, including ricotta and mozzarella. Skim or 1% milk (  liquid, powdered, or evaporated). Buttermilk made with low-fat milk. Nonfat or low-fat yogurt. Fats and oils Non-hydrogenated (trans-free) margarines. Vegetable oils, including soybean, sesame, sunflower, olive, avocado, peanut, safflower, corn, canola, and cottonseed. Salad dressings or mayonnaise made with a vegetable oil. Beverages Water (mineral or sparkling). Coffee and tea. Unsweetened ice tea. Diet beverages. Sweets and desserts Sherbet, gelatin, and fruit ice. Small amounts of dark chocolate. Limit  all sweets and desserts. Seasonings and condiments All seasonings and condiments. The items listed above may not be a complete list of foods and beverages you can eat. Contact a dietitian for more options. What foods should I avoid? Fruits Canned fruit in heavy syrup. Fruit in cream or butter sauce. Fried fruit. Limit coconut. Vegetables Vegetables cooked in cheese, cream, or butter sauce. Fried vegetables. Grains Breads made with saturated or trans fats, oils, or whole milk. Croissants. Sweet rolls. Donuts. High-fat crackers, such as cheese crackers and chips. Meats and other proteins Fatty meats, such as hot dogs, ribs, sausage, bacon, rib-eye roast or steak. High-fat deli meats, such as salami and bologna. Caviar. Domestic duck and goose. Organ meats, such as liver. Dairy Cream, sour cream, cream cheese, and creamed cottage cheese. Whole-milk cheeses. Whole or 2% milk (liquid, evaporated, or condensed). Whole buttermilk. Cream sauce or high-fat cheese sauce. Whole-milk yogurt. Fats and oils Meat fat, or shortening. Cocoa butter, hydrogenated oils, palm oil, coconut oil, palm kernel oil. Solid fats and shortenings, including bacon fat, salt pork, lard, and butter. Nondairy cream substitutes. Salad dressings with cheese or sour cream. Beverages Regular sodas and any drinks with added sugar. Sweets and desserts Frosting. Pudding. Cookies. Cakes. Pies. Milk chocolate or white chocolate. Buttered syrups. Full-fat ice cream or ice cream drinks. The items listed above may not be a complete list of foods and beverages to avoid. Contact a dietitian for more information. Summary Heart-healthy meal planning includes limiting unhealthy fats, increasing healthy fats, limiting salt (sodium) intake and making other diet and lifestyle changes. Lose weight if you are overweight. Losing just 5-10% of your body weight can help your overall health and prevent diseases such as diabetes and heart  disease. Focus on eating a balance of foods, including fruits and vegetables, low-fat or nonfat dairy, lean protein, nuts and legumes, whole grains, and heart-healthy oils and fats. This information is not intended to replace advice given to you by your health care provider. Make sure you discuss any questions you have with your health care provider. Document Revised: 01/23/2021 Document Reviewed: 01/23/2021 Elsevier Patient Education  2024 [employer].

## 2022-06-19 NOTE — Progress Notes (Signed)
Cardiology Office Note:  .   Date:  06/19/2022  ID:  Sean Willis, DOB [birth year], MRN [id number] PCP: Georgann Housekeeper, MD  [facility] Providers Cardiologist:  Armanda Magic, MD { History of Present Illness: Sean Willis Kitchen   Sean Willis is a 77 y.o. male with a past medical history of CVA, HTN, BPH, CAD status post CABG 2013, CKD stage IIIa, thoracic aortic aneurysm and rate controlled atrial fibrillation who presents today for follow-up appointment.  He was seen in the A-fib clinic 06/07/2022.  Patient has been in permanent atrial fibrillation since 2018.  Patient recently presented to [facility] 04/09/2022 with headache, dizziness, and mildly slurred speech found to have left cerebral hemorrhage.  Did have upcoming appointment with Dr. Lalla Brothers to discuss Watchman procedure.  Patient complained of palpitations and lightheadedness on 6/5 which resolved after about 10 minutes.  Had been on Lopressor 12.5 mg twice daily since hospitalization due to lower BP readings.  Dr. Mayford Knife had recommended holding until further evaluation.  Patient was on Xarelto 15 mg daily for CHA2DS2-VASc score of 6.  When seen by the A-fib clinic he was in rate controlled atrial relation.  Noted palpitations the day prior that lasted a few minutes.  Patient was at [facility] rehab for 3 weeks after discharge from [facility].  Had been back home for the past week and noted his blood pressure was 130 over 80s.  Patient was feeling fine when in A-fib and was able to do normal activities.  Today, he tells me that he had another brain bleed.  Blood pressure has been high as of late 160s over 114 for example.  His Lopressor was recently increased to a pill and a half twice a day.  We have increased it further today.  Heart rate is 100.  We have encouraged proper hydration.  He wants Dr. Mayford Knife and Dr. Patty Sermons to weigh in on watchman and let him know that it is a good idea for him.  Off anticoagulation and currently in atrial  fibrillation.  We discussed watchman and he seems interested in a consultation.  He does have occasional chest tightness but this is associated with his palpitations.  Reports no shortness of breath nor dyspnea on exertion.  No edema, orthopnea, PND.   ROS: Refer to HPI for pertinent ROS  Studies Reviewed: .        Echo 01/20/21 demonstrated: 1. Left ventricular ejection fraction, by estimation, is 40 to 45%. The  left ventricle has mildly decreased function. The left ventricle  demonstrates global hypokinesis. Left ventricular diastolic parameters are  indeterminate.   2. Right ventricular systolic function is normal. The right ventricular  size is normal. Tricuspid regurgitation signal is inadequate for assessing  PA pressure.   3. Left atrial size was severely dilated.   4. Right atrial size was mildly dilated.   5. The mitral valve is grossly normal. No evidence of mitral valve  regurgitation.   6. The aortic valve is grossly normal. Aortic valve regurgitation is not  visualized.   7. Aortic ascending aortic aneurysm 4.2 cm.   8. The inferior vena cava is normal in size with greater than 50%  respiratory variability, suggesting right atrial pressure of 3 mmHg.    Epic records are reviewed at length today.   Risk Assessment/Calculations:    CHA2DS2-VASc Score = 6  This indicates a 9.7% annual risk of stroke. The patient's score is based upon: CHF History: 0 HTN History: 1 Diabetes  History: 0 Stroke History: 2 Vascular Disease History: 1 Age Score: 2 Gender Score: 0       Physical Exam:   VS:  BP (!) 128/104   Pulse (!) 106   Ht 5' 9.5" (1.765 m)   Wt 198 lb 12.8 oz (90.2 kg)   SpO2 99%   BMI 28.94 kg/m    Wt Readings from Last 3 Encounters:  06/19/22 198 lb 12.8 oz (90.2 kg)  06/07/22 198 lb (89.8 kg)  05/29/22 180 lb 3.2 oz (81.7 kg)    GEN: Well nourished, well developed in no acute distress NECK: No JVD; No carotid bruits CARDIAC: Irregularly irregular,  no murmurs, rubs, gallops RESPIRATORY:  Clear to auscultation without rales, wheezing or rhonchi  ABDOMEN: Soft, non-tender, non-distended EXTREMITIES:  No edema; No deformity   ASSESSMENT AND PLAN: .   1.  Permanent atrial fibrillation since 2018 -Continues with atrial fibrillation, rate right at 100 -Cannot be on anticoagulation due to recent brain bleed, Watchman discussed -Would continue current medications, metoprolol increased to 50 twice daily -He can take an extra 12.5 mg of metoprolol as needed for palpitations  2.  Secondary hypercoagulable state -Watchman discussed but he would like Dr. Mayford Knife and Dr. Patty Sermons to sign off on recommendations  3.  CAD status post CABG 2013 -Only chest pain has occurred in the setting of palpitations, otherwise asymptomatic -Continue current medications which include Vascepa 2 g twice daily, Lopressor 50 mg twice a day, nitro as needed, Xarelto 15 mg daily, Crestor 20 mg daily -We have ordered some labs including fasting lipids, LFTs, CBC, Bement, and magnesium  4. Thoracic aortic aneurysm -He tells me that Dr. Patty Sermons has been monitoring this for years.   Dispo: Plan to follow-up in 3 to 4 months with Dr. Mayford Knife  Signed, Sharlene Dory, PA-C

## 2022-06-20 ENCOUNTER — Other Ambulatory Visit: Payer: Self-pay | Admitting: Nurse Practitioner

## 2022-06-20 DIAGNOSIS — I4819 Other persistent atrial fibrillation: Secondary | ICD-10-CM

## 2022-06-20 DIAGNOSIS — K219 Gastro-esophageal reflux disease without esophagitis: Secondary | ICD-10-CM

## 2022-06-20 DIAGNOSIS — E782 Mixed hyperlipidemia: Secondary | ICD-10-CM

## 2022-06-21 DIAGNOSIS — R531 Weakness: Secondary | ICD-10-CM | POA: Diagnosis not present

## 2022-06-21 DIAGNOSIS — I4819 Other persistent atrial fibrillation: Secondary | ICD-10-CM | POA: Diagnosis not present

## 2022-06-21 DIAGNOSIS — I69328 Other speech and language deficits following cerebral infarction: Secondary | ICD-10-CM | POA: Diagnosis not present

## 2022-06-21 DIAGNOSIS — I130 Hypertensive heart and chronic kidney disease with heart failure and stage 1 through stage 4 chronic kidney disease, or unspecified chronic kidney disease: Secondary | ICD-10-CM | POA: Diagnosis not present

## 2022-06-21 DIAGNOSIS — I69398 Other sequelae of cerebral infarction: Secondary | ICD-10-CM | POA: Diagnosis not present

## 2022-06-21 DIAGNOSIS — I5022 Chronic systolic (congestive) heart failure: Secondary | ICD-10-CM | POA: Diagnosis not present

## 2022-06-27 DIAGNOSIS — I69398 Other sequelae of cerebral infarction: Secondary | ICD-10-CM | POA: Diagnosis not present

## 2022-06-27 DIAGNOSIS — I4819 Other persistent atrial fibrillation: Secondary | ICD-10-CM | POA: Diagnosis not present

## 2022-06-27 DIAGNOSIS — I69328 Other speech and language deficits following cerebral infarction: Secondary | ICD-10-CM | POA: Diagnosis not present

## 2022-06-27 DIAGNOSIS — I5022 Chronic systolic (congestive) heart failure: Secondary | ICD-10-CM | POA: Diagnosis not present

## 2022-06-27 DIAGNOSIS — R531 Weakness: Secondary | ICD-10-CM | POA: Diagnosis not present

## 2022-06-27 DIAGNOSIS — I130 Hypertensive heart and chronic kidney disease with heart failure and stage 1 through stage 4 chronic kidney disease, or unspecified chronic kidney disease: Secondary | ICD-10-CM | POA: Diagnosis not present

## 2022-06-28 ENCOUNTER — Ambulatory Visit: Payer: Medicare Other | Attending: Physician Assistant

## 2022-06-28 DIAGNOSIS — D6869 Other thrombophilia: Secondary | ICD-10-CM | POA: Diagnosis not present

## 2022-06-28 DIAGNOSIS — E782 Mixed hyperlipidemia: Secondary | ICD-10-CM

## 2022-06-28 DIAGNOSIS — I77810 Thoracic aortic ectasia: Secondary | ICD-10-CM

## 2022-06-28 DIAGNOSIS — I4821 Permanent atrial fibrillation: Secondary | ICD-10-CM | POA: Diagnosis not present

## 2022-06-28 DIAGNOSIS — I482 Chronic atrial fibrillation, unspecified: Secondary | ICD-10-CM | POA: Diagnosis not present

## 2022-06-29 DIAGNOSIS — I69319 Unspecified symptoms and signs involving cognitive functions following cerebral infarction: Secondary | ICD-10-CM | POA: Diagnosis not present

## 2022-06-29 DIAGNOSIS — I69320 Aphasia following cerebral infarction: Secondary | ICD-10-CM | POA: Diagnosis not present

## 2022-06-29 DIAGNOSIS — R531 Weakness: Secondary | ICD-10-CM | POA: Diagnosis not present

## 2022-06-29 LAB — LIPID PANEL
Chol/HDL Ratio: 3.8 ratio (ref 0.0–5.0)
Cholesterol, Total: 120 mg/dL (ref 100–199)
HDL: 32 mg/dL — ABNORMAL LOW (ref >39)
LDL Chol Calc (NIH): 55 mg/dL (ref 0–99)
Triglycerides: 198 mg/dL — ABNORMAL HIGH (ref 0–149)
VLDL Cholesterol Cal: 33 mg/dL (ref 5–40)

## 2022-06-29 LAB — BASIC METABOLIC PANEL
BUN/Creatinine Ratio: 12 (ref 10–24)
BUN: 13 mg/dL (ref 8–27)
CO2: 24 mmol/L (ref 20–29)
Calcium: 9.7 mg/dL (ref 8.6–10.2)
Chloride: 102 mmol/L (ref 96–106)
Creatinine, Ser: 1.13 mg/dL (ref 0.76–1.27)
Glucose: 162 mg/dL — ABNORMAL HIGH (ref 70–99)
Potassium: 5 mmol/L (ref 3.5–5.2)
Sodium: 140 mmol/L (ref 134–144)
eGFR: 67 mL/min/{1.73_m2} (ref >59)

## 2022-06-29 LAB — HEPATIC FUNCTION PANEL
ALT: 23 IU/L (ref 0–44)
AST: 18 IU/L (ref 0–40)
Albumin: 4.3 g/dL (ref 3.8–4.8)
Alkaline Phosphatase: 98 IU/L (ref 44–121)
Bilirubin Total: 0.7 mg/dL (ref 0.0–1.2)
Bilirubin, Direct: 0.2 mg/dL (ref 0.00–0.40)
Total Protein: 7.1 g/dL (ref 6.0–8.5)

## 2022-06-29 LAB — CBC
Hematocrit: 48.7 % (ref 37.5–51.0)
Hemoglobin: 16.1 g/dL (ref 13.0–17.7)
MCH: 32.3 pg (ref 26.6–33.0)
MCHC: 33.1 g/dL (ref 31.5–35.7)
MCV: 98 fL — ABNORMAL HIGH (ref 79–97)
Platelets: 161 10*3/uL (ref 150–450)
RBC: 4.99 x10E6/uL (ref 4.14–5.80)
RDW: 13.5 % (ref 11.6–15.4)
WBC: 8.3 10*3/uL (ref 3.4–10.8)

## 2022-06-29 LAB — MAGNESIUM: Magnesium: 1.9 mg/dL (ref 1.6–2.3)

## 2022-07-02 DIAGNOSIS — I69328 Other speech and language deficits following cerebral infarction: Secondary | ICD-10-CM | POA: Diagnosis not present

## 2022-07-02 DIAGNOSIS — I5022 Chronic systolic (congestive) heart failure: Secondary | ICD-10-CM | POA: Diagnosis not present

## 2022-07-02 DIAGNOSIS — I130 Hypertensive heart and chronic kidney disease with heart failure and stage 1 through stage 4 chronic kidney disease, or unspecified chronic kidney disease: Secondary | ICD-10-CM | POA: Diagnosis not present

## 2022-07-02 DIAGNOSIS — R531 Weakness: Secondary | ICD-10-CM | POA: Diagnosis not present

## 2022-07-02 DIAGNOSIS — I4819 Other persistent atrial fibrillation: Secondary | ICD-10-CM | POA: Diagnosis not present

## 2022-07-02 DIAGNOSIS — I69398 Other sequelae of cerebral infarction: Secondary | ICD-10-CM | POA: Diagnosis not present

## 2022-07-04 DIAGNOSIS — R338 Other retention of urine: Secondary | ICD-10-CM | POA: Diagnosis not present

## 2022-07-04 DIAGNOSIS — G4733 Obstructive sleep apnea (adult) (pediatric): Secondary | ICD-10-CM | POA: Diagnosis not present

## 2022-07-04 DIAGNOSIS — I482 Chronic atrial fibrillation, unspecified: Secondary | ICD-10-CM | POA: Diagnosis not present

## 2022-07-04 DIAGNOSIS — I2510 Atherosclerotic heart disease of native coronary artery without angina pectoris: Secondary | ICD-10-CM | POA: Diagnosis not present

## 2022-07-04 DIAGNOSIS — R531 Weakness: Secondary | ICD-10-CM | POA: Diagnosis not present

## 2022-07-04 DIAGNOSIS — M1710 Unilateral primary osteoarthritis, unspecified knee: Secondary | ICD-10-CM | POA: Diagnosis not present

## 2022-07-04 DIAGNOSIS — J449 Chronic obstructive pulmonary disease, unspecified: Secondary | ICD-10-CM | POA: Diagnosis not present

## 2022-07-04 DIAGNOSIS — Z951 Presence of aortocoronary bypass graft: Secondary | ICD-10-CM | POA: Diagnosis not present

## 2022-07-04 DIAGNOSIS — Z8673 Personal history of transient ischemic attack (TIA), and cerebral infarction without residual deficits: Secondary | ICD-10-CM | POA: Diagnosis not present

## 2022-07-04 DIAGNOSIS — N1831 Chronic kidney disease, stage 3a: Secondary | ICD-10-CM | POA: Diagnosis not present

## 2022-07-04 DIAGNOSIS — I5022 Chronic systolic (congestive) heart failure: Secondary | ICD-10-CM | POA: Diagnosis not present

## 2022-07-04 DIAGNOSIS — F419 Anxiety disorder, unspecified: Secondary | ICD-10-CM | POA: Diagnosis not present

## 2022-07-04 DIAGNOSIS — M5136 Other intervertebral disc degeneration, lumbar region: Secondary | ICD-10-CM | POA: Diagnosis not present

## 2022-07-04 DIAGNOSIS — I340 Nonrheumatic mitral (valve) insufficiency: Secondary | ICD-10-CM | POA: Diagnosis not present

## 2022-07-04 DIAGNOSIS — K219 Gastro-esophageal reflux disease without esophagitis: Secondary | ICD-10-CM | POA: Diagnosis not present

## 2022-07-04 DIAGNOSIS — I6529 Occlusion and stenosis of unspecified carotid artery: Secondary | ICD-10-CM | POA: Diagnosis not present

## 2022-07-04 DIAGNOSIS — E782 Mixed hyperlipidemia: Secondary | ICD-10-CM | POA: Diagnosis not present

## 2022-07-04 DIAGNOSIS — M109 Gout, unspecified: Secondary | ICD-10-CM | POA: Diagnosis not present

## 2022-07-04 DIAGNOSIS — I130 Hypertensive heart and chronic kidney disease with heart failure and stage 1 through stage 4 chronic kidney disease, or unspecified chronic kidney disease: Secondary | ICD-10-CM | POA: Diagnosis not present

## 2022-07-04 DIAGNOSIS — Z7901 Long term (current) use of anticoagulants: Secondary | ICD-10-CM | POA: Diagnosis not present

## 2022-07-04 DIAGNOSIS — N401 Enlarged prostate with lower urinary tract symptoms: Secondary | ICD-10-CM | POA: Diagnosis not present

## 2022-07-04 DIAGNOSIS — I69398 Other sequelae of cerebral infarction: Secondary | ICD-10-CM | POA: Diagnosis not present

## 2022-07-04 DIAGNOSIS — I69328 Other speech and language deficits following cerebral infarction: Secondary | ICD-10-CM | POA: Diagnosis not present

## 2022-07-04 DIAGNOSIS — I4819 Other persistent atrial fibrillation: Secondary | ICD-10-CM | POA: Diagnosis not present

## 2022-07-10 DIAGNOSIS — R531 Weakness: Secondary | ICD-10-CM | POA: Diagnosis not present

## 2022-07-10 DIAGNOSIS — I130 Hypertensive heart and chronic kidney disease with heart failure and stage 1 through stage 4 chronic kidney disease, or unspecified chronic kidney disease: Secondary | ICD-10-CM | POA: Diagnosis not present

## 2022-07-10 DIAGNOSIS — I69398 Other sequelae of cerebral infarction: Secondary | ICD-10-CM | POA: Diagnosis not present

## 2022-07-10 DIAGNOSIS — I69328 Other speech and language deficits following cerebral infarction: Secondary | ICD-10-CM | POA: Diagnosis not present

## 2022-07-10 DIAGNOSIS — I5022 Chronic systolic (congestive) heart failure: Secondary | ICD-10-CM | POA: Diagnosis not present

## 2022-07-10 DIAGNOSIS — I4819 Other persistent atrial fibrillation: Secondary | ICD-10-CM | POA: Diagnosis not present

## 2022-07-11 ENCOUNTER — Telehealth: Payer: Self-pay | Admitting: Cardiology

## 2022-07-11 ENCOUNTER — Telehealth: Payer: Self-pay | Admitting: *Deleted

## 2022-07-11 MED ORDER — LOSARTAN POTASSIUM 25 MG PO TABS: 25.0000 mg | ORAL_TABLET | Freq: Every day | ORAL | 1 refills | Status: DC

## 2022-07-11 NOTE — Telephone Encounter (Signed)
Pt c/o BP issue: STAT if pt c/o blurred vision, one-sided weakness or slurred speech  1. What are your last 5 BP readings?   162/116 156/108  2. Are you having any other symptoms (ex. Dizziness, headache, blurred vision, passed out)?   No  3. What is your BP issue?   Step-daughter stated patient had a stroke 3 months ago and she is concerned about patient's high BP readings.

## 2022-07-11 NOTE — Telephone Encounter (Signed)
Patient's daughter in law Heidi notified of results.  She reports due to patient's recent stroke it is difficult to get patient to make any diet changes.  They would be willing to start fenofibrate. Heidi reports patient's stomach is swollen.  Weight was 180 lbs a month ago, 190 lbs on 6/26 and most recently was 196 lbs.  He is always short of breath and it does not seem to have worsened recently.  Heidi reports voice sounds a little hoarse and he has wheezing.  No fever. No nasal congestion.  No swelling. They spoke with PCP and were advised to follow up with cardiology.  Heidi is requesting appointment.  Appointment made for patient to see K. Lyman Bishop, NP on July 12 at 8:50.

## 2022-07-11 NOTE — Telephone Encounter (Signed)
Left message for Heidi to callback.  Chart review shows patient had been taking Losartan 25mg  daily as needed to high blood pressure. This was discontinued with no reason indicated on 05/07/22.  Need to confirm what medications patient is currently taking for BP.

## 2022-07-11 NOTE — Telephone Encounter (Signed)
Returned call to [facility] and discussed patient's blood pressures.  Most recent BP readings: 7/10---162/116 7/9---156/108 7/8---161/107 6/18---128/104 6/17---179/127 6/16---144/103  Heidi states patient had a stroke in April 2024, and he has been having issues with confusion/depression/anxiety since then.  Patient is currently taking Lopressor 50mg  BID. In May 2024 Losartan 25mg  daily had been discontinued, Heidi reports this was due to patient having low BP.  Discussed with Dr. Anne Fu (DOD): restart Losartan 25mg  every day, monitor BP daily over the next week and call with a list of readings.   Shared the recommendation from Dr. Anne Fu with Trudie Buckler, and she is agreeable. Losartan 25mg  every day sent to [facility] in [place], [place] as requested. Instructed her to monitor patient's BP at least 2 hours after taking his medication.   Heidi verbalized understanding and expressed appreciation for call. She will call next week with update on patient's BP.

## 2022-07-11 NOTE — Telephone Encounter (Signed)
-----   Message from Sharlene Dory, [place] sent at 07/02/2022  5:09 PM EDT ----- Mr. Arndt, Your triglycerides are headed in the wrong direction.  Are you willing to try a medication called fenofibrate on top of your Vascepa for better control?  Is there any room in your diet to adjust to help with your numbers?  Thanks! Sharlene Dory, PA-C

## 2022-07-11 NOTE — Telephone Encounter (Signed)
Sean Willis was returning nurse call and is requesting a callback. Please advise

## 2022-07-12 ENCOUNTER — Ambulatory Visit: Payer: Medicare Other | Admitting: Cardiology

## 2022-07-12 NOTE — Progress Notes (Signed)
Cardiology Clinic Note   Patient Name: Sean Willis Date of Encounter: 07/13/2022  Primary Care Provider:  Georgann Housekeeper, MD Primary Cardiologist:  Sean Magic, MD  Patient Profile     77 year old male with hx of CVA, HTN, BPH, CAD status post CABG 2013, CKD stage IIIa, thoracic aortic aneurysm and rate controlled atrial fibrillation. Patient has been in permanent atrial fibrillation since 2018. Patient was on Xarelto 15 mg daily for CHA2DS2-VASc score of 6. Recent admission to [facility] for cerebral hemorrhage, Watchman has been discussed with Sean Willis.  Anticoagulation was discontinued. Last seen in the office by Sean Willis on 06/19/2022 in afib clinic.  Past Medical History    Past Medical History:  Diagnosis Date   Benign essential HTN 11/24/2013   BPH (benign prostatic hyperplasia)    patient unaware   CAD (coronary artery disease), native coronary artery 04/2011   S/P CABG X4 Sx done in [place]   Carotid artery stenosis    1-39% bilateral by dopplers 2018   Chest wall pain    Right side, muscular,ortho,MRI. U/S abd ok. On lyrica   Chronic atrial fibrillation (HCC)    s/p DCCV 01/2016, subsequent reversion, patient denied AAD   Chronic insomnia    CKD (chronic kidney disease), stage III (HCC)    Colon polyp 10/11   DDD (degenerative disc disease), lumbar    Dilatation of aorta (HCC)    aortic root is mildly enlarged at 43mm and moderately enlarged ascending aorta at 45mm by echo 03/2020   ED (erectile dysfunction)    GERD (gastroesophageal reflux disease)    Gout    History of kidney stones    History of migraine    Ischemic cardiomyopathy    EF 35-40% with moderate RV dysfunction by echo 03/2020 and unchanged from echo 2018   Kidney stone    uric acid, Dr Sean Willis  renal insufficiency   Mitral regurgitation 06/04/2016   Moderate by echo 2020   Mixed hyperlipidemia 01/23/2013   Myocardial infarction (HCC)    OSA on CPAP 07/16/2016   Osteoarthritis    of  the knee   TIA (transient ischemic attack)    questionable Mini stroke per patient   Past Surgical History:  Procedure Laterality Date   CARDIOVERSION [name] 01/20/2016   Procedure: CARDIOVERSION;  Surgeon: Sean Mixer, MD;  Location: [facility] ENDOSCOPY;  Service: Cardiovascular;  Laterality: [name];   CARDIOVERSION [name] 03/23/2016   Procedure: CARDIOVERSION;  Surgeon: Sean Reichert, MD;  Location: [facility] ENDOSCOPY;  Service: Cardiovascular;  Laterality: [name];   CARDIOVERSION     CATARACT EXTRACTION W/PHACO Right 10/26/2014   Procedure: CATARACT EXTRACTION PHACO AND INTRAOCULAR LENS PLACEMENT (IOC);  Surgeon: Sean Manila, MD;  Location: [facility] ORS;  Service: Ophthalmology;  Laterality: Right;  LOT PACK: [id number] H US:01:43.0 AP:27.8 CDE:28.68   CATARACT EXTRACTION W/PHACO Left 11/13/2016   Procedure: CATARACT EXTRACTION PHACO AND INTRAOCULAR LENS PLACEMENT (IOC);  Surgeon: Sean Manila, MD;  Location: [facility] ORS;  Service: Ophthalmology;  Laterality: Left;  [place] 01:00.2 AP% 15.7 CDE 9.47 Fluid Pack lot # [id number]   COLONOSCOPY WITH PROPOFOL [name] 09/27/2015   Procedure: COLONOSCOPY WITH PROPOFOL;  Surgeon: Sean Bumpers, MD;  Location: [facility] ENDOSCOPY;  Service: Endoscopy;  Laterality: [name];   CORONARY ARTERY BYPASS GRAFT  04/2011   Severe multivessel ASCAD s/p Stemi w PTCA of OM2 then s/p CABG w LIMA to LAD,SVG to OM1 /RI and SVG to Distal RCA    CYSTOSCOPY/URETEROSCOPY/HOLMIUM LASER/STENT PLACEMENT  Left 12/21/2019   Procedure: CYSTOSCOPY/RETROGREADE PYLEOGRAM/URETEROSCOPY/HOLMIUM LASER/STENT PLACEMENT;  Surgeon: Sean Hick, MD;  Location: [facility] ORS;  Service: Urology;  Laterality: Left;  ONLY NEEDS 60  MIN   FINGER SURGERY     left finger for ganglion cyst   KIDNEY STONE SURGERY     LEFT HEART CATH AND CORS/GRAFTS ANGIOGRAPHY [name] 12/02/2018   Procedure: LEFT HEART CATH AND CORS/GRAFTS ANGIOGRAPHY;  Surgeon: Sean Records, MD;  Location: [facility] INVASIVE CV LAB;  Service: Cardiovascular;  Laterality: [name];    TONSILLECTOMY      Allergies  Allergies  Allergen Reactions   Atorvastatin     Joint pain on 80mg  daily   Celebrex [Celecoxib]     Upset stomach   Entresto [Sacubitril-Valsartan]     Low BP/difficulty talking/migraine    History of Present Illness    Sean Willis comes today with his stepdaughter with complaints of heartburn feeling, and occasional palpitations.  He does have some issues with short-term memory.  There is someone staying with him 24 hours a day.  He is interested in the Watchman device and would like to discuss this further with EP on follow-up visit.  Blood pressure was elevated at home and they contacted primary care provider Sean Willis who restarted him on losartan 25 mg daily.  Blood pressure normalized.  They were reporting blood pressures of 170/110 at home.  The patient admits to eating a lot of salty and sweet foods.  Stepdaughter also reported that [facility] neurology has restarted him on rivaroxaban 15 mg daily.  They are following him regularly and he has had a recent CT scan on review of Care Everywhere on 06/06/2022 which did not show any new bleeding.  Home Medications    Current Outpatient Medications  Medication Sig Dispense Refill   acetaminophen (TYLENOL) 325 MG tablet Take 650 mg by mouth as needed.     allopurinol (ZYLOPRIM) 100 MG tablet Take 1 tablet (100 mg total) by mouth daily. 30 tablet 0   ALPRAZolam (XANAX) 1 MG tablet Take 1 tablet (1 mg total) by mouth at bedtime. 15 tablet 0   busPIRone (BUSPAR) 5 MG tablet Take 5 mg by mouth daily.     escitalopram (LEXAPRO) 10 MG tablet Take 1 tablet (10 mg total) by mouth daily. 30 tablet 0   losartan (COZAAR) 25 MG tablet Take 1 tablet (25 mg total) by mouth daily. 90 tablet 1   metoprolol tartrate (LOPRESSOR) 50 MG tablet Take 1 tablet (50 mg total) by mouth 2 (two) times daily. MAY TAKE AN EXTRA 12.5 MG BY MOUTH AS NEEDED 225 tablet 3   pantoprazole (PROTONIX) 20 MG tablet Take 1 tablet (20 mg total) by  mouth daily. 30 tablet 2   pregabalin (LYRICA) 50 MG capsule Take 1 capsule (50 mg total) by mouth 3 (three) times daily. 90 capsule 0   Rivaroxaban (XARELTO) 15 MG TABS tablet Take 1 tablet (15 mg total) by mouth daily with supper. 30 tablet 0   rosuvastatin (CRESTOR) 20 MG tablet Take 1 tablet (20 mg total) by mouth daily. 30 tablet 0   VASCEPA 1 g capsule Take 2 capsules (2 g total) by mouth 2 (two) times daily. 120 capsule 0   nitroGLYCERIN (NITROSTAT) 0.4 MG SL tablet Place 1 tablet (0.4 mg total) under the tongue every 5 (five) minutes as needed for chest pain. 25 tablet 1   No current facility-administered medications for this visit.     Family History  Family History  Problem Relation Age of Onset   Alzheimer's disease Mother    Cancer Mother    CVA Father    Arrhythmia Father    Arrhythmia Sister    He indicated that his mother is deceased. He indicated that his father is deceased. He indicated that both of his sisters are alive. He indicated that his maternal grandmother is deceased. He indicated that his maternal grandfather is deceased. He indicated that his paternal grandmother is deceased. He indicated that his paternal grandfather is deceased.  Social History    Social History   Socioeconomic History   Marital status: Widowed    Spouse name: Not on file   Number of children: Not on file   Years of education: Not on file   Highest education level: Not on file  Occupational History   Not on file  Tobacco Use   Smoking status: Never   Smokeless tobacco: Never  Vaping Use   Vaping status: Never Used  Substance and Sexual Activity   Alcohol use: Not Currently    Comment: yes occassionally   Drug use: No   Sexual activity: Not on file  Other Topics Concern   Not on file  Social History Narrative   Not on file   Social Determinants of Health   Financial Resource Strain: Low Risk  (04/10/2022)   Received from [facility], [facility]   Overall  Financial Resource Strain (CARDIA)    Difficulty of Paying Living Expenses: Not very hard  Food Insecurity: No Food Insecurity (04/10/2022)   Received from [facility], [facility]   Hunger Vital Sign    Worried About Running Out of Food in the Last Year: Never true    Ran Out of Food in the Last Year: Never true  Transportation Needs: No Transportation Needs (05/04/2022)   Received from [facility], [facility]   [facility] - Transportation    Lack of Transportation (Medical): No    Lack of Transportation (Non-Medical): No  Physical Activity: Not on file  Stress: Not on file  Social Connections: Not on file  Intimate Partner Violence: Not on file     Review of Systems    General:  No chills, fever, night sweats or weight changes.  Cardiovascular: Positive for heartburn-like chest pain, no dyspnea on exertion, edema, orthopnea, palpitations, paroxysmal nocturnal dyspnea. Dermatological: No rash, lesions/masses Respiratory: No cough, dyspnea Urologic: No hematuria, dysuria Abdominal:   No nausea, vomiting, diarrhea, bright red blood per rectum, melena, or hematemesis Neurologic:  No visual changes, wkns, changes in mental status.  Memory loss, anxiety.  All other systems reviewed and are otherwise negative except as noted above.       Physical Exam    VS:  BP 138/70 (BP Location: Left Arm, Patient Position: Sitting, Cuff Size: Normal)   Pulse 70   Ht 5' 9.5" (1.765 m)   Wt 196 lb (88.9 kg)   SpO2 93%   BMI 28.53 kg/m  , BMI Body mass index is 28.53 kg/m.     GEN: Well nourished, well developed, in no acute distress. HEENT: normal. Neck: Supple, no JVD, carotid bruits, or masses. Cardiac: IRRR, no murmurs, rubs, or gallops. No clubbing, cyanosis, edema.  Radials/DP/PT 2+ and equal bilaterally.  Respiratory:  Respirations regular and unlabored, clear to auscultation bilaterally.  Would not take deep breaths GI: Soft, nontender, nondistended, BS + x 4. MS: no  deformity or atrophy. Skin: warm and dry,  no rash. Neuro:  Strength and sensation are intact. Psych: Normal affect.  Repeats information several times.      Lab Results  Component Value Date   WBC 8.3 06/28/2022   HGB 16.1 06/28/2022   HCT 48.7 06/28/2022   MCV 98 (H) 06/28/2022   PLT 161 06/28/2022   Lab Results  Component Value Date   CREATININE 1.13 06/28/2022   BUN 13 06/28/2022   NA 140 06/28/2022   K 5.0 06/28/2022   CL 102 06/28/2022   CO2 24 06/28/2022   Lab Results  Component Value Date   ALT 23 06/28/2022   AST 18 06/28/2022   ALKPHOS 98 06/28/2022   BILITOT 0.7 06/28/2022   Lab Results  Component Value Date   CHOL 120 06/28/2022   HDL 32 (L) 06/28/2022   LDLCALC 55 06/28/2022   TRIG 198 (H) 06/28/2022   CHOLHDL 3.8 06/28/2022    Lab Results  Component Value Date   HGBA1C 6.1 (H) 01/21/2021     Review of Prior Studies  CT Scan of the Head 06/06/2022 FINDINGS:  There is no midline shift. No mass lesion. There is no evidence of acute infarct. Focal area of hypoattenuation in the left cerebellum likely representing resolved hemorrhage. No acute intracranial hemorrhage.  There are scattered hypodense foci within the periventricular and deep white matter.  These are nonspecific but commonly associated with small vessel ischemic changes.    No fractures are evident. Mucosal thickening of the right sphenoid sinus.      Echo 01/20/21 demonstrated: 1. Left ventricular ejection fraction, by estimation, is 40 to 45%. The  left ventricle has mildly decreased function. The left ventricle  demonstrates global hypokinesis. Left ventricular diastolic parameters are  indeterminate.   2. Right ventricular systolic function is normal. The right ventricular  size is normal. Tricuspid regurgitation signal is inadequate for assessing  Willis pressure.   3. Left atrial size was severely dilated.   4. Right atrial size was mildly dilated.   5. The mitral valve is grossly  normal. No evidence of mitral valve  regurgitation.   6. The aortic valve is grossly normal. Aortic valve regurgitation is not  visualized.   7. Aortic ascending aortic aneurysm 4.2 cm.   8. The inferior vena cava is normal in size with greater than 50%  respiratory variability, suggesting right atrial pressure of 3 mmHg.    Epic Willis are reviewed at length today.   Risk Assessment/Calculations:     CHA2DS2-VASc Score = 6  This indicates a 9.7% annual risk of stroke. The patient's score is based upon: CHF History: 0 HTN History: 1 Diabetes History: 0 Stroke History: 2 Vascular Disease History: 1 Age Score: 2 Gender Score: 0    Assessment & Plan   1.  Atrial Fib: Heart rate is well-controlled.  He still complains of feeling palpitations.  He wears a chain around his neck but can hold his medications and he has Tablets of metoprolol and it to take when he feels it.  He will stay on Xarelto and this will be managed by neurology.  He reported just before leaving that he was starting to have chest pain and palpitations.  I did reexamine him and his heart rate was irregular and had a normal rate.  We did repeat his EKG he was A-fib heart rate of 62.  He pointed to a place on his upper right chest at the fourth intercostal space stating that he was hurting there.  The  pain was reproducible.  Once I examined him he also stated that his right ear was hurting him.  He was not short of breath, he was not clammy, there were no changes in his EKG during the time of his chest discomfort.  I suggested that they remove the metoprolol from this medication bottle which she was neck and have family member checked his pulse or call provider before giving him extra dose of metoprolol.    2.  Hypertension: Blood pressure is well-controlled today in the office.  He will remain on metoprolol and losartan as directed.This should also keep up with his blood pressure at the same time every day and record him  seated.  They are to report any blood pressures greater than 150/90, or less than 100/50.  We need to do water on this place      3.  Hypercholesterolemia: Patient continues on 20 mg daily.  Most recent labs on 06/28/2022 total cholesterol 120, HDL 22, triglycerides 198, LDL 56.  4.  History of TIAs: Being followed by neurology most recent CT scan in June 2024 did not reveal any intracranial bleeds or CVA.   Signed, Bettey Mare. Liborio Nixon, ANP, AACC   07/13/2022 10:29 AM      Office [phone] Fax [phone]  Notice: This dictation was prepared with Dragon dictation along with smaller phrase technology. Any transcriptional errors that result from this process are unintentional and may not be corrected upon review.

## 2022-07-13 ENCOUNTER — Ambulatory Visit: Payer: Medicare Other | Attending: Adult Health | Admitting: Adult Health

## 2022-07-13 ENCOUNTER — Encounter: Payer: Self-pay | Admitting: Adult Health

## 2022-07-13 VITALS — BP 138/70 | HR 70 | Ht 69.5 in | Wt 196.0 lb

## 2022-07-13 DIAGNOSIS — I614 Nontraumatic intracerebral hemorrhage in cerebellum: Secondary | ICD-10-CM

## 2022-07-13 DIAGNOSIS — I5022 Chronic systolic (congestive) heart failure: Secondary | ICD-10-CM | POA: Diagnosis not present

## 2022-07-13 DIAGNOSIS — E7800 Pure hypercholesterolemia, unspecified: Secondary | ICD-10-CM

## 2022-07-13 DIAGNOSIS — I2510 Atherosclerotic heart disease of native coronary artery without angina pectoris: Secondary | ICD-10-CM | POA: Diagnosis not present

## 2022-07-13 DIAGNOSIS — I4811 Longstanding persistent atrial fibrillation: Secondary | ICD-10-CM | POA: Diagnosis not present

## 2022-07-13 MED ORDER — PANTOPRAZOLE SODIUM 20 MG PO TBEC: 20.0000 mg | DELAYED_RELEASE_TABLET | Freq: Every day | ORAL | 2 refills | Status: DC

## 2022-07-13 MED ORDER — NITROGLYCERIN 0.4 MG SL SUBL
0.4000 mg | SUBLINGUAL_TABLET | SUBLINGUAL | 1 refills | Status: AC | PRN
Start: 2022-07-13 — End: 2022-10-11

## 2022-07-13 NOTE — Patient Instructions (Signed)
Medication Instructions:  No Changes *If you need a refill on your cardiac medications before your next appointment, please call your pharmacy*   Lab Work: No Labs If you have labs (blood work) drawn today and your tests are completely normal, you will receive your results only by: MyChart Message (if you have MyChart) OR A paper copy in the mail If you have any lab test that is abnormal or we need to change your treatment, we will call you to review the results.   Testing/Procedures: No Testing   Follow-Up: At [facility], you and your health needs are our priority.  As part of our continuing mission to provide you with exceptional heart care, we have created designated Provider Care Teams.  These Care Teams include your primary Cardiologist (physician) and Advanced Practice Providers (APPs -  Physician Assistants and Nurse Practitioners) who all work together to provide you with the care you need, when you need it.  We recommend signing up for the patient portal called "MyChart".  Sign up information is provided on this After Visit Summary.  MyChart is used to connect with patients for Virtual Visits (Telemedicine).  Patients are able to view lab/test results, encounter notes, upcoming appointments, etc.  Non-urgent messages can be sent to your provider as well.   To learn more about what you can do with MyChart, go to [email or web address].    Your next appointment:   Keep Scheduled Appointment  Provider:   Armanda Magic, MD    Other Instructions Log Blood Pressure Daily. If Blood Pressure Great than 150/90 or Less than 110/50, Please Call our office.

## 2022-07-17 DIAGNOSIS — I4819 Other persistent atrial fibrillation: Secondary | ICD-10-CM | POA: Diagnosis not present

## 2022-07-17 DIAGNOSIS — I69328 Other speech and language deficits following cerebral infarction: Secondary | ICD-10-CM | POA: Diagnosis not present

## 2022-07-17 DIAGNOSIS — R531 Weakness: Secondary | ICD-10-CM | POA: Diagnosis not present

## 2022-07-17 DIAGNOSIS — I69398 Other sequelae of cerebral infarction: Secondary | ICD-10-CM | POA: Diagnosis not present

## 2022-07-17 DIAGNOSIS — I130 Hypertensive heart and chronic kidney disease with heart failure and stage 1 through stage 4 chronic kidney disease, or unspecified chronic kidney disease: Secondary | ICD-10-CM | POA: Diagnosis not present

## 2022-07-17 DIAGNOSIS — I5022 Chronic systolic (congestive) heart failure: Secondary | ICD-10-CM | POA: Diagnosis not present

## 2022-07-18 DIAGNOSIS — R531 Weakness: Secondary | ICD-10-CM | POA: Diagnosis not present

## 2022-07-18 DIAGNOSIS — I69398 Other sequelae of cerebral infarction: Secondary | ICD-10-CM | POA: Diagnosis not present

## 2022-07-18 DIAGNOSIS — I4819 Other persistent atrial fibrillation: Secondary | ICD-10-CM | POA: Diagnosis not present

## 2022-07-18 DIAGNOSIS — I130 Hypertensive heart and chronic kidney disease with heart failure and stage 1 through stage 4 chronic kidney disease, or unspecified chronic kidney disease: Secondary | ICD-10-CM | POA: Diagnosis not present

## 2022-07-18 DIAGNOSIS — I69328 Other speech and language deficits following cerebral infarction: Secondary | ICD-10-CM | POA: Diagnosis not present

## 2022-07-18 DIAGNOSIS — I614 Nontraumatic intracerebral hemorrhage in cerebellum: Secondary | ICD-10-CM | POA: Diagnosis not present

## 2022-07-18 DIAGNOSIS — I5022 Chronic systolic (congestive) heart failure: Secondary | ICD-10-CM | POA: Diagnosis not present

## 2022-07-23 DIAGNOSIS — R4189 Other symptoms and signs involving cognitive functions and awareness: Secondary | ICD-10-CM | POA: Diagnosis not present

## 2022-07-23 DIAGNOSIS — I6930 Unspecified sequelae of cerebral infarction: Secondary | ICD-10-CM | POA: Diagnosis not present

## 2022-07-23 DIAGNOSIS — F39 Unspecified mood [affective] disorder: Secondary | ICD-10-CM | POA: Diagnosis not present

## 2022-07-23 DIAGNOSIS — I10 Essential (primary) hypertension: Secondary | ICD-10-CM | POA: Diagnosis not present

## 2022-07-25 ENCOUNTER — Telehealth: Payer: Self-pay | Admitting: Cardiology

## 2022-07-25 DIAGNOSIS — I69320 Aphasia following cerebral infarction: Secondary | ICD-10-CM | POA: Diagnosis not present

## 2022-07-25 DIAGNOSIS — I69319 Unspecified symptoms and signs involving cognitive functions following cerebral infarction: Secondary | ICD-10-CM | POA: Diagnosis not present

## 2022-07-25 DIAGNOSIS — I69398 Other sequelae of cerebral infarction: Secondary | ICD-10-CM | POA: Diagnosis not present

## 2022-07-25 DIAGNOSIS — R531 Weakness: Secondary | ICD-10-CM | POA: Diagnosis not present

## 2022-07-25 NOTE — Telephone Encounter (Signed)
  Pt had to cancel appt with Dr. Lalla Brothers, pt had a stroke. Need to reschedule watchman consult

## 2022-07-25 NOTE — Telephone Encounter (Signed)
Left message to call back  

## 2022-07-26 DIAGNOSIS — H43813 Vitreous degeneration, bilateral: Secondary | ICD-10-CM | POA: Diagnosis not present

## 2022-07-26 DIAGNOSIS — Z961 Presence of intraocular lens: Secondary | ICD-10-CM | POA: Diagnosis not present

## 2022-07-31 DIAGNOSIS — I69398 Other sequelae of cerebral infarction: Secondary | ICD-10-CM | POA: Diagnosis not present

## 2022-07-31 DIAGNOSIS — I4819 Other persistent atrial fibrillation: Secondary | ICD-10-CM | POA: Diagnosis not present

## 2022-07-31 DIAGNOSIS — I130 Hypertensive heart and chronic kidney disease with heart failure and stage 1 through stage 4 chronic kidney disease, or unspecified chronic kidney disease: Secondary | ICD-10-CM | POA: Diagnosis not present

## 2022-07-31 DIAGNOSIS — I5022 Chronic systolic (congestive) heart failure: Secondary | ICD-10-CM | POA: Diagnosis not present

## 2022-07-31 DIAGNOSIS — R531 Weakness: Secondary | ICD-10-CM | POA: Diagnosis not present

## 2022-07-31 DIAGNOSIS — I69328 Other speech and language deficits following cerebral infarction: Secondary | ICD-10-CM | POA: Diagnosis not present

## 2022-08-02 NOTE — Telephone Encounter (Signed)
Second attempt to reach patient for LAAO consultation with no answer. LVM. On chart review, it appears the family has been overwhelmed after his stroke with unclear long term outcome. Will plan one additional attempt to reach him and if no call returned, plan to remove him from referral queue.   Georgie Chard NP-C Structural Heart Team  Pager: [phone] Phone: [phone]'

## 2022-08-03 NOTE — Telephone Encounter (Signed)
Left message to call back if he wishes to be scheduled for LAAO consult.

## 2022-08-17 NOTE — Telephone Encounter (Signed)
Called patient and cg Heidi Faucette to follow up on blood pressure readings since medications were changed. No answer, left detailed message per DPR asking patient to family member to call back with recent blood pressure readings.

## 2022-08-20 NOTE — Telephone Encounter (Signed)
Called to follow up on patient's blood pressures, no answer, left detailed message per DPR asking patient to call our office to let us know how his blood pressures are.

## 2022-08-21 DIAGNOSIS — F411 Generalized anxiety disorder: Secondary | ICD-10-CM | POA: Diagnosis not present

## 2022-08-21 DIAGNOSIS — R338 Other retention of urine: Secondary | ICD-10-CM | POA: Diagnosis not present

## 2022-08-21 DIAGNOSIS — I6930 Unspecified sequelae of cerebral infarction: Secondary | ICD-10-CM | POA: Diagnosis not present

## 2022-08-28 DIAGNOSIS — N200 Calculus of kidney: Secondary | ICD-10-CM | POA: Diagnosis not present

## 2022-08-28 DIAGNOSIS — N401 Enlarged prostate with lower urinary tract symptoms: Secondary | ICD-10-CM | POA: Diagnosis not present

## 2022-08-28 DIAGNOSIS — R351 Nocturia: Secondary | ICD-10-CM | POA: Diagnosis not present

## 2022-08-29 ENCOUNTER — Institutional Professional Consult (permissible substitution): Payer: Medicare Other | Admitting: Physician Assistant

## 2022-09-07 NOTE — Telephone Encounter (Signed)
Called to follow up on patient's blood pressures, no answer, left detailed message per DPR asking patient to call our office to let us know how his blood pressures are. Letter sent.

## 2022-09-18 DIAGNOSIS — I4819 Other persistent atrial fibrillation: Secondary | ICD-10-CM | POA: Diagnosis not present

## 2022-09-18 DIAGNOSIS — I10 Essential (primary) hypertension: Secondary | ICD-10-CM | POA: Diagnosis not present

## 2022-09-18 DIAGNOSIS — M109 Gout, unspecified: Secondary | ICD-10-CM | POA: Diagnosis not present

## 2022-09-18 DIAGNOSIS — I6930 Unspecified sequelae of cerebral infarction: Secondary | ICD-10-CM | POA: Diagnosis not present

## 2022-09-18 DIAGNOSIS — R7303 Prediabetes: Secondary | ICD-10-CM | POA: Diagnosis not present

## 2022-09-18 DIAGNOSIS — I712 Thoracic aortic aneurysm, without rupture, unspecified: Secondary | ICD-10-CM | POA: Diagnosis not present

## 2022-09-18 DIAGNOSIS — E782 Mixed hyperlipidemia: Secondary | ICD-10-CM | POA: Diagnosis not present

## 2022-09-18 DIAGNOSIS — Z23 Encounter for immunization: Secondary | ICD-10-CM | POA: Diagnosis not present

## 2022-09-18 DIAGNOSIS — N1831 Chronic kidney disease, stage 3a: Secondary | ICD-10-CM | POA: Diagnosis not present

## 2022-09-18 DIAGNOSIS — F411 Generalized anxiety disorder: Secondary | ICD-10-CM | POA: Diagnosis not present

## 2022-09-18 DIAGNOSIS — I25810 Atherosclerosis of coronary artery bypass graft(s) without angina pectoris: Secondary | ICD-10-CM | POA: Diagnosis not present

## 2022-09-18 DIAGNOSIS — Z0000 Encounter for general adult medical examination without abnormal findings: Secondary | ICD-10-CM | POA: Diagnosis not present

## 2022-09-18 DIAGNOSIS — N400 Enlarged prostate without lower urinary tract symptoms: Secondary | ICD-10-CM | POA: Diagnosis not present

## 2022-09-19 ENCOUNTER — Other Ambulatory Visit: Payer: Self-pay | Admitting: Nurse Practitioner

## 2022-09-19 DIAGNOSIS — I4819 Other persistent atrial fibrillation: Secondary | ICD-10-CM

## 2022-09-19 DIAGNOSIS — K219 Gastro-esophageal reflux disease without esophagitis: Secondary | ICD-10-CM

## 2022-09-21 DIAGNOSIS — Z8673 Personal history of transient ischemic attack (TIA), and cerebral infarction without residual deficits: Secondary | ICD-10-CM | POA: Diagnosis not present

## 2022-09-21 DIAGNOSIS — I611 Nontraumatic intracerebral hemorrhage in hemisphere, cortical: Secondary | ICD-10-CM | POA: Diagnosis not present

## 2022-09-21 DIAGNOSIS — I6930 Unspecified sequelae of cerebral infarction: Secondary | ICD-10-CM | POA: Diagnosis not present

## 2022-09-24 ENCOUNTER — Other Ambulatory Visit: Payer: Self-pay | Admitting: Nurse Practitioner

## 2022-09-24 DIAGNOSIS — I69319 Unspecified symptoms and signs involving cognitive functions following cerebral infarction: Secondary | ICD-10-CM | POA: Diagnosis not present

## 2022-10-22 ENCOUNTER — Other Ambulatory Visit: Payer: Self-pay | Admitting: Nurse Practitioner

## 2022-10-22 ENCOUNTER — Ambulatory Visit: Payer: Medicare Other | Admitting: Physician Assistant

## 2022-10-22 ENCOUNTER — Other Ambulatory Visit: Payer: Self-pay | Admitting: Adult Health

## 2022-10-22 DIAGNOSIS — I4819 Other persistent atrial fibrillation: Secondary | ICD-10-CM

## 2022-10-22 DIAGNOSIS — K219 Gastro-esophageal reflux disease without esophagitis: Secondary | ICD-10-CM

## 2022-10-24 NOTE — Progress Notes (Deleted)
Cardiology Office Note:  .   Date:  10/24/2022  ID:  Sean Willis, DOB Jan 23, 1945, MRN [id number] PCP: Georgann Housekeeper, MD  [facility] Providers Cardiologist:  Armanda Magic, MD { History of Present Illness: [name]   Sean Willis is a 77 y.o. male with a past medical history of CVA, HTN, BPH, CAD status post CABG 2013, CKD stage IIIa, thoracic aortic aneurysm and rate controlled atrial fibrillation who presents today for follow-up appointment.  He was seen in the A-fib clinic 06/07/2022.  Patient has been in permanent atrial fibrillation since 2018.  Patient recently presented to [facility] 04/09/2022 with headache, dizziness, and mildly slurred speech found to have left cerebral hemorrhage.  Did have upcoming appointment with Dr. Lalla Brothers to discuss Watchman procedure.  Patient complained of palpitations and lightheadedness on 6/5 which resolved after about 10 minutes.  Patient was on Xarelto 15 mg daily for CHA2DS2-VASc score of 6 but this was stopped.  Apparently he had another brain bleed.  Blood pressure had been high as of late 160s over 114.  His Lopressor was recently increased.  Off anticoagulation and currently in atrial fibrillation. Several attempts have been made to get patient into device clinic to discuss watchman device and messages have been left on answering machine.  He is here today for followup and is doing well.  He denies any chest pain or pressure, SOB, DOE, PND, orthopnea, LE edema, dizziness, palpitations or syncope. He is compliant with his meds and is tolerating meds with no SE.     ROS: Refer to HPI for pertinent ROS  Studies Reviewed: .        Echo 01/20/21 demonstrated: 1. Left ventricular ejection fraction, by estimation, is 40 to 45%. The  left ventricle has mildly decreased function. The left ventricle  demonstrates global hypokinesis. Left ventricular diastolic parameters are  indeterminate.   2. Right ventricular systolic function is normal. The  right ventricular  size is normal. Tricuspid regurgitation signal is inadequate for assessing  PA pressure.   3. Left atrial size was severely dilated.   4. Right atrial size was mildly dilated.   5. The mitral valve is grossly normal. No evidence of mitral valve  regurgitation.   6. The aortic valve is grossly normal. Aortic valve regurgitation is not  visualized.   7. Aortic ascending aortic aneurysm 4.2 cm.   8. The inferior vena cava is normal in size with greater than 50%  respiratory variability, suggesting right atrial pressure of 3 mmHg.    Epic records are reviewed at length today.   Risk Assessment/Calculations:    CHA2DS2-VASc Score = 6  This indicates a 9.7% annual risk of stroke. The patient's score is based upon: CHF History: 0 HTN History: 1 Diabetes History: 0 Stroke History: 2 Vascular Disease History: 1 Age Score: 2 Gender Score: 0       Physical Exam:   VS:  There were no vitals taken for this visit.   Wt Readings from Last 3 Encounters:  07/13/22 196 lb (88.9 kg)  06/19/22 198 lb 12.8 oz (90.2 kg)  06/07/22 198 lb (89.8 kg)    GEN: Well nourished, well developed in no acute distress HEENT: Normal NECK: No JVD; No carotid bruits LYMPHATICS: No lymphadenopathy CARDIAC:RRR, no murmurs, rubs, gallops RESPIRATORY:  Clear to auscultation without rales, wheezing or rhonchi  ABDOMEN: Soft, non-tender, non-distended MUSCULOSKELETAL:  No edema; No deformity  SKIN: Warm and dry NEUROLOGIC:  Alert and oriented x 3 PSYCHIATRIC:  Normal affect  ASSESSMENT AND PLAN: .   Permanent atrial fibrillation since 2018 -She denies any palpitations and heart rate is well-controlled -Was on Xarelto 15 mg daily for a CHADS2 Vascor of 6 but -Would continue current medications, metoprolol increased to 50 twice daily and Xarelto 15 mg daily -Continue prescription drug management with Lopressor 50 mg twice daily with as needed refills  CAD status post CABG 2013 -status  post CABG 4 2013 in setting of MI in [place], [place] with initial PTCA of the OM 2 and subsequent CABG with LIMA to the LAD, SVG to diagonal 1, SVG to OM1/R1 and SVG to distal RCA. -repeat cath 12/2018 showed bypass graft failure of the SVG to the RCA along with atresia of the LIMA to the LAD. There is also loss of the the end to side anastomosis of the sequential SVG to the OM.  Severe native LAD disease  -felt to best manage medically in light of no ischemia on Myoview.  -He denies any anginal symptoms since I saw him last -Continue prescription drug management with Lopressor 50 mg twice daily and Crestor 20 mg daily with as needed refills  Ascending aortic dilatation -2D echo 01/20/2021 showed ascending aorta measuring 4.2 cm -Repeat 2D echo for follow-up  Dilated cardiomyopathy/chronic systolic CHF --2D echo 3/22  showed moderate LV and RV dysfunction with EF 35-40% with severe LAE, mild MR  -repeat cath 12/2018 showed bypass graft failure of the SVG to the RCA along with atresia of the LIMA to the LAD. There is also loss of the the end to side anastomosis of the sequential SVG to the OM.  Severe native LAD disease>> medical management recommended -2D echo 01/20/2021 showed EF 40 to 45% with global hypokinesis -He does not appear volume overloaded on exam today -He did not tolerate Entresto>>continue Losartan 25mg  daily with PRN refills -Consolidate Lopressor to Toprol-XL 100 mg daily  -Follow-up with Pharm.D. and if BP stable add Spiro 12.5 mg in 1 week  HLD -LDL goal < 55 -continue prescription drug management with Crestor 20mg  daily and Vascepa 2gm BID -I have personally reviewed and interpreted outside labs performed by patient's PCP which showed ***  HTN -BP controlled on exam today -continue Losartan 25mg  daily and BB    Signed, Armanda Magic, MD

## 2022-10-25 ENCOUNTER — Telehealth: Payer: Self-pay | Admitting: Cardiology

## 2022-10-25 ENCOUNTER — Ambulatory Visit: Payer: Medicare Other | Admitting: Cardiology

## 2022-10-25 DIAGNOSIS — I10 Essential (primary) hypertension: Secondary | ICD-10-CM

## 2022-10-25 DIAGNOSIS — I4821 Permanent atrial fibrillation: Secondary | ICD-10-CM

## 2022-10-25 DIAGNOSIS — I5022 Chronic systolic (congestive) heart failure: Secondary | ICD-10-CM

## 2022-10-25 DIAGNOSIS — I77810 Thoracic aortic ectasia: Secondary | ICD-10-CM

## 2022-10-25 DIAGNOSIS — E782 Mixed hyperlipidemia: Secondary | ICD-10-CM

## 2022-10-25 DIAGNOSIS — I2510 Atherosclerotic heart disease of native coronary artery without angina pectoris: Secondary | ICD-10-CM

## 2022-10-25 NOTE — Telephone Encounter (Signed)
Pt stopped by for appt, was late, sched for feb. But is asking for a referral for neurology and that is his main concern. Please contact pt to advise.

## 2022-10-25 NOTE — Telephone Encounter (Signed)
Call to patient, no answer, left detailed per DPR offering to reschedule today's appt, also asked for more information about neurology referral needs. Asked patient to give [place] a call back at office.

## 2022-11-16 NOTE — Telephone Encounter (Signed)
Call to patient, no answer, left detailed per DPR advising that his appt in late October was rescheduled to February 2025 due to late arrival. Also asked for more information about neurology referral needs. Asked patient to give [place] a call back at office.

## 2022-11-26 DIAGNOSIS — I10 Essential (primary) hypertension: Secondary | ICD-10-CM | POA: Diagnosis not present

## 2022-11-26 DIAGNOSIS — N1831 Chronic kidney disease, stage 3a: Secondary | ICD-10-CM | POA: Diagnosis not present

## 2022-11-26 DIAGNOSIS — E1122 Type 2 diabetes mellitus with diabetic chronic kidney disease: Secondary | ICD-10-CM | POA: Diagnosis not present

## 2022-11-26 DIAGNOSIS — I6930 Unspecified sequelae of cerebral infarction: Secondary | ICD-10-CM | POA: Diagnosis not present

## 2022-12-03 ENCOUNTER — Telehealth: Payer: Self-pay | Admitting: Cardiology

## 2022-12-03 NOTE — Telephone Encounter (Signed)
Left message to call back  

## 2022-12-03 NOTE — Telephone Encounter (Signed)
Pt said he is ready to schedule the Watchman Procedure. Please advise

## 2022-12-04 NOTE — Telephone Encounter (Signed)
Patient returned call to schedule Watchman consultation. He was initially placed on Dr. Lavone Neri schedule for 1/10 however this was an oversight on my part. The patient was called back to move this appointment to Monday 01/14/2023 at 10AM with Dr. Lalla Brothers. Left voice message regarding this change and MyChart message sent as well with the update.

## 2022-12-04 NOTE — Telephone Encounter (Signed)
Patient returned call and would like to keep the appointment at the [facility] office with Dr. Jimmey Ralph. He is scheduled for Watchman consultation 01/08/23 at 11:40AM with an arrival time of 11:25AM.

## 2022-12-11 ENCOUNTER — Other Ambulatory Visit: Payer: Self-pay | Admitting: Nurse Practitioner

## 2022-12-11 DIAGNOSIS — I4819 Other persistent atrial fibrillation: Secondary | ICD-10-CM

## 2022-12-11 DIAGNOSIS — K219 Gastro-esophageal reflux disease without esophagitis: Secondary | ICD-10-CM

## 2022-12-20 ENCOUNTER — Other Ambulatory Visit: Payer: Self-pay | Admitting: Nurse Practitioner

## 2022-12-20 DIAGNOSIS — I4819 Other persistent atrial fibrillation: Secondary | ICD-10-CM

## 2023-01-04 NOTE — Telephone Encounter (Signed)
 Tried to call patient again about request for neurology referral, there appear to be neurology visits or notes in epic from [facility] health system. No answer, left detailed VM per DPR explaining that if patient has any further needs for neuro referral he can certainly call us , but as he appears to have follow up/treatment set up we will defer we will wait to hear from him about any further referral needs/questions.

## 2023-01-08 ENCOUNTER — Ambulatory Visit: Payer: Medicare Other | Attending: Cardiology | Admitting: Cardiology

## 2023-01-08 ENCOUNTER — Encounter: Payer: Self-pay | Admitting: Cardiology

## 2023-01-08 ENCOUNTER — Institutional Professional Consult (permissible substitution): Payer: Medicare Other | Admitting: Cardiology

## 2023-01-08 VITALS — BP 126/98 | HR 90 | Ht 69.0 in | Wt 195.2 lb

## 2023-01-08 DIAGNOSIS — D6869 Other thrombophilia: Secondary | ICD-10-CM | POA: Diagnosis not present

## 2023-01-08 DIAGNOSIS — I4821 Permanent atrial fibrillation: Secondary | ICD-10-CM | POA: Diagnosis not present

## 2023-01-08 DIAGNOSIS — I629 Nontraumatic intracranial hemorrhage, unspecified: Secondary | ICD-10-CM | POA: Diagnosis not present

## 2023-01-08 DIAGNOSIS — I4811 Longstanding persistent atrial fibrillation: Secondary | ICD-10-CM

## 2023-01-08 NOTE — Progress Notes (Signed)
 Electrophysiology Office Note:   Date:  01/09/2023  ID:  DIA JEFFERYS, DOB 1945/04/10, MRN [id number]  Primary Cardiologist: Wilbert Bihari, MD Primary Heart Failure: None Electrophysiologist: Fonda Kitty, MD      History of Present Illness:   Sean Willis is a 78 y.o. male with h/o CVA, HTN, BPH, CAD status post CABG 2013, CKD stage IIIa, thoracic aortic aneurysm, permanent atrial fibrillation and intracranial hemorrhage who is being seen today for evaluation for Watchman device implant.  Patient was accompanied by his full-time caretaker.  Patient presented to [facility] on 04/09/2022 with headache, dizziness and mildly slurred speech.  He was found to have left cerebellar hemorrhage at that time. He was intubated for airway protection and emergent EVD was placed. He was extubated the next day and EVD weaned and removed on 4/19. Imaging at that time revealed resolution of his bleed and decreased ventricle size.  His Xarelto  and aspirin  were held in the setting of a bleed.  They have subsequently been restarted after approval by neurology.  He states that he has recovered relatively well from this.  He remains in permanent atrial fibrillation.  He denies any symptoms that he knows of related to his atrial fibrillation.  Today, he has no new or acute complaints.  Review of systems complete and found to be negative unless listed in HPI.   EP Information / Studies Reviewed:    EKG is ordered today. Personal review as below.  EKG Interpretation Date/Time:  Tuesday January 08 2023 11:46:45 EST Ventricular Rate:  90 PR Interval:    QRS Duration:  90 QT Interval:  378 QTC Calculation: 462 R Axis:   8  Text Interpretation: Atrial fibrillation When compared with ECG of 13-Jul-2022 10:02, No significant change was found Confirmed by Kitty Fonda [phone]) on 01/09/2023 4:36:38 PM   Echo 01/20/2021:  1. Left ventricular ejection fraction, by estimation, is 40 to 45%. The  left ventricle has  mildly decreased function. The left ventricle  demonstrates global hypokinesis. Left ventricular diastolic parameters are  indeterminate.   2. Right ventricular systolic function is normal. The right ventricular  size is normal. Tricuspid regurgitation signal is inadequate for assessing  PA pressure.   3. Left atrial size was severely dilated.   4. Right atrial size was mildly dilated.   5. The mitral valve is grossly normal. No evidence of mitral valve  regurgitation.   6. The aortic valve is grossly normal. Aortic valve regurgitation is not  visualized.   7. Aortic ascending aortic aneurysm 4.2 cm.   8. The inferior vena cava is normal in size with greater than 50%  respiratory variability, suggesting right atrial pressure of 3 mmHg.   CTA Chest and Aorta 08/09/21: IMPRESSION: Grossly stable 4.6 cm ascending thoracic aortic aneurysm. Recommend semi-annual imaging followup by CTA or MRA and referral to cardiothoracic surgery if not already obtained. This recommendation follows 2010 ACCF/AHA/AATS/ACR/ASA/SCA/SCAI/SIR/STS/SVM Guidelines for the Diagnosis and Management of Patients With Thoracic Aortic Disease. Circulation. 2010; 121: Z733-z630. Aortic aneurysm NOS (ICD10-I71.9).   Status post coronary bypass graft.  Risk Assessment/Calculations:    CHA2DS2-VASc Score = 6   This indicates a 9.7% annual risk of stroke. The patient's score is based upon: CHF History: 0 HTN History: 1 Diabetes History: 0 Stroke History: 2 Vascular Disease History: 1 Age Score: 2 Gender Score: 0            Physical Exam:   VS:  BP (!) 126/98   Pulse 90  Ht 5' 9 (1.753 m)   Wt 195 lb 3.2 oz (88.5 kg)   SpO2 95%   BMI 28.83 kg/m    Wt Readings from Last 3 Encounters:  01/08/23 195 lb 3.2 oz (88.5 kg)  07/13/22 196 lb (88.9 kg)  06/19/22 198 lb 12.8 oz (90.2 kg)     GEN: Well nourished, well developed in no acute distress NECK: No JVD CARDIAC: Normal rate, regular  rhythm RESPIRATORY:  Clear to auscultation without rales, wheezing or rhonchi  ABDOMEN: Soft, non-tender, non-distended EXTREMITIES:  No edema; No deformity   ASSESSMENT AND PLAN:    Sean Willis is a 78 y.o. male with h/o CVA, HTN, BPH, CAD status post CABG 2013, CKD stage IIIa, thoracic aortic aneurysm, permanent atrial fibrillation and intracranial hemorrhage who is being seen today for evaluation for Watchman device implant.  I have seen BELA Willis in the office today who is being considered for a Watchman left atrial appendage closure device. I believe they will benefit from this procedure given their history of atrial fibrillation, CHA2DS2-VASc score of 6 and unadjusted ischemic stroke rate of 9.7% per year. Unfortunately, the patient is not felt to be a long term anticoagulation candidate secondary to history of intracranial bleed. The patient's chart has been reviewed and I feel that they would be a candidate for short term oral anticoagulation after Watchman implant.   It is my belief that after undergoing a LAA closure procedure, RAMELLO CORDIAL will not need long term anticoagulation which eliminates anticoagulation side effects and major bleeding risk.   Procedural risks for the Watchman implant have been reviewed with the patient including a 0.5% risk of stroke, <1% risk of perforation and <1% risk of device embolization. Other risks include bleeding, vascular damage, tamponade, worsening renal function, and death. The patient understands these risk and wishes to proceed.     The published clinical data on the safety and effectiveness of WATCHMAN include but are not limited to the following: - Holmes DR, Jess BEARD, Sick P et al. for the PROTECT AF Investigators. Percutaneous closure of the left atrial appendage versus warfarin therapy for prevention of stroke in patients with atrial fibrillation: a randomised non-inferiority trial. Lancet 2009; 374: 534-42. GLENWOOD Jess BEARD,  Doshi SK, Jonita VEAR Satchel D et al. on behalf of the PROTECT AF Investigators. Percutaneous Left Atrial Appendage Closure for Stroke Prophylaxis in Patients With Atrial Fibrillation 2.3-Year Follow-up of the PROTECT AF (Watchman Left Atrial Appendage System for Embolic Protection in Patients With Atrial Fibrillation) Trial. Circulation 2013; 127:720-729. - Alli O, Doshi S,  Kar S, Reddy VY, Sievert H et al. Quality of Life Assessment in the Randomized PROTECT AF (Percutaneous Closure of the Left Atrial Appendage Versus Warfarin Therapy for Prevention of Stroke in Patients With Atrial Fibrillation) Trial of Patients at Risk for Stroke With Nonvalvular Atrial Fibrillation. J Am Coll Cardiol 2013; 61:1790-8. GLENWOOD Satchel DR, Archer RAMAN, Price M, Whisenant B, Sievert H, Doshi S, Huber K, Reddy V. Prospective randomized evaluation of the Watchman left atrial appendage Device in patients with atrial fibrillation versus long-term warfarin therapy; the PREVAIL trial. Journal of the [employer] of Cardiology, Vol. 4, No. 1, 2014, 1-11. - Kar S, Doshi SK, Sadhu A, Horton R, Osorio J et al. Primary outcome evaluation of a next-generation left atrial appendage closure device: results from the PINNACLE FLX trial. Circulation 2021;143(18)1754-1762.   After today's visit with the patient which was dedicated solely for shared decision making visit regarding  LAA closure device, the patient decided to proceed with the LAA appendage closure procedure scheduled to be done in the near future at [facility].  Was able to get review of CT from 2023 by one of my cardiac imaging colleagues, interpretation as follows: He has a windsock appendage.  Would need a mid inferior stick for access.  At best guess a 31 mm device, given this was back in 2023 and its a non-gated study.   Given that he has been in permanent atrial fibrillation since, and he is due for repeat surveillance of his aorta.  I think it would be reasonable to  repeat imaging at this time.  HAS-BLED score: 4 Hypertension Yes  Abnormal renal and liver function (Dialysis, transplant, Cr >2.26 mg/dL /Cirrhosis or Bilirubin >2x Normal or AST/ALT/AP >3x Normal) No  Stroke Yes  Bleeding Yes  Labile INR (Unstable/high INR) No  Elderly (>65) Yes  Drugs or alcohol (>= 8 drinks/week, anti-plt or NSAID) No   CHA2DS2-VASc Score = 6  The patient's score is based upon: CHF History: 0 HTN History: 1 Diabetes History: 0 Stroke History: 2 Vascular Disease History: 1 Age Score: 2 Gender Score: 0       ASSESSMENT AND PLAN: Permanent Atrial Fibrillation  The patient's CHA2DS2-VASc score is 6, indicating a 9.7% annual risk of stroke.   Patient is rate controlled and asymptomatic. Continue metoprolol .  History of intracranial hemorrhage Secondary Hypercoagulable State  The patient is at significant risk for stroke/thromboembolism based upon his CHA2DS2-VASc Score of 6.  Continue Rivaroxaban  (Xarelto ).    Signed,  Fonda Kitty, MD    01/09/2023 4:36 PM

## 2023-01-08 NOTE — Patient Instructions (Signed)
 Medication Instructions:  Your physician recommends that you continue on your current medications as directed. Please refer to the Current Medication list given to you today.  *If you need a refill on your cardiac medications before your next appointment, please call your pharmacy*  Testing/Procedures: Watchman  Your physician has requested that you have Left atrial appendage (LAA) closure device implantation is a procedure to put a small device in the LAA of the heart. The LAA is a small sac in the wall of the heart's left upper chamber. Blood clots can form in this area. The device, Watchman closes the LAA to help prevent a blood clot and stroke.   Follow-Up: At [facility], you and your health needs are our priority.  As part of our continuing mission to provide you with exceptional heart care, we have created designated Provider Care Teams.  These Care Teams include your primary Cardiologist (physician) and Advanced Practice Providers (APPs -  Physician Assistants and Nurse Practitioners) who all work together to provide you with the care you need, when you need it.  Your next appointment:   You will be contacted by Nurse Navigator, Rockie Redman to schedule your pre-procedure visit and procedure date. If you have any questions she can be reached at [phone].

## 2023-01-10 ENCOUNTER — Other Ambulatory Visit: Payer: Self-pay | Admitting: Cardiology

## 2023-01-10 ENCOUNTER — Encounter: Payer: Self-pay | Admitting: Cardiology

## 2023-01-10 DIAGNOSIS — I77810 Thoracic aortic ectasia: Secondary | ICD-10-CM

## 2023-01-10 DIAGNOSIS — I480 Paroxysmal atrial fibrillation: Secondary | ICD-10-CM

## 2023-01-11 ENCOUNTER — Other Ambulatory Visit: Payer: Self-pay | Admitting: Cardiology

## 2023-01-11 ENCOUNTER — Institutional Professional Consult (permissible substitution): Payer: Medicare Other | Admitting: Cardiology

## 2023-01-11 ENCOUNTER — Telehealth: Payer: Self-pay | Admitting: Cardiology

## 2023-01-11 DIAGNOSIS — E870 Hyperosmolality and hypernatremia: Secondary | ICD-10-CM | POA: Diagnosis not present

## 2023-01-11 DIAGNOSIS — Z01812 Encounter for preprocedural laboratory examination: Secondary | ICD-10-CM

## 2023-01-11 DIAGNOSIS — R739 Hyperglycemia, unspecified: Secondary | ICD-10-CM | POA: Diagnosis not present

## 2023-01-11 DIAGNOSIS — R918 Other nonspecific abnormal finding of lung field: Secondary | ICD-10-CM | POA: Diagnosis not present

## 2023-01-11 DIAGNOSIS — I619 Nontraumatic intracerebral hemorrhage, unspecified: Secondary | ICD-10-CM | POA: Diagnosis not present

## 2023-01-11 DIAGNOSIS — R29810 Facial weakness: Secondary | ICD-10-CM | POA: Diagnosis not present

## 2023-01-11 DIAGNOSIS — D6832 Hemorrhagic disorder due to extrinsic circulating anticoagulants: Secondary | ICD-10-CM | POA: Diagnosis not present

## 2023-01-11 DIAGNOSIS — J9691 Respiratory failure, unspecified with hypoxia: Secondary | ICD-10-CM | POA: Diagnosis not present

## 2023-01-11 DIAGNOSIS — G936 Cerebral edema: Secondary | ICD-10-CM | POA: Diagnosis not present

## 2023-01-11 DIAGNOSIS — I517 Cardiomegaly: Secondary | ICD-10-CM | POA: Diagnosis not present

## 2023-01-11 DIAGNOSIS — R0600 Dyspnea, unspecified: Secondary | ICD-10-CM | POA: Diagnosis not present

## 2023-01-11 DIAGNOSIS — Z951 Presence of aortocoronary bypass graft: Secondary | ICD-10-CM | POA: Diagnosis not present

## 2023-01-11 DIAGNOSIS — I610 Nontraumatic intracerebral hemorrhage in hemisphere, subcortical: Secondary | ICD-10-CM | POA: Diagnosis not present

## 2023-01-11 DIAGNOSIS — E119 Type 2 diabetes mellitus without complications: Secondary | ICD-10-CM | POA: Diagnosis not present

## 2023-01-11 DIAGNOSIS — Z4682 Encounter for fitting and adjustment of non-vascular catheter: Secondary | ICD-10-CM | POA: Diagnosis not present

## 2023-01-11 DIAGNOSIS — F411 Generalized anxiety disorder: Secondary | ICD-10-CM | POA: Diagnosis not present

## 2023-01-11 DIAGNOSIS — F419 Anxiety disorder, unspecified: Secondary | ICD-10-CM | POA: Diagnosis not present

## 2023-01-11 DIAGNOSIS — I161 Hypertensive emergency: Secondary | ICD-10-CM | POA: Diagnosis not present

## 2023-01-11 DIAGNOSIS — Z452 Encounter for adjustment and management of vascular access device: Secondary | ICD-10-CM | POA: Diagnosis not present

## 2023-01-11 DIAGNOSIS — J984 Other disorders of lung: Secondary | ICD-10-CM | POA: Diagnosis not present

## 2023-01-11 DIAGNOSIS — I719 Aortic aneurysm of unspecified site, without rupture: Secondary | ICD-10-CM | POA: Diagnosis present

## 2023-01-11 DIAGNOSIS — J9811 Atelectasis: Secondary | ICD-10-CM | POA: Diagnosis not present

## 2023-01-11 DIAGNOSIS — J9690 Respiratory failure, unspecified, unspecified whether with hypoxia or hypercapnia: Secondary | ICD-10-CM | POA: Diagnosis not present

## 2023-01-11 DIAGNOSIS — K219 Gastro-esophageal reflux disease without esophagitis: Secondary | ICD-10-CM | POA: Diagnosis present

## 2023-01-11 DIAGNOSIS — J690 Pneumonitis due to inhalation of food and vomit: Secondary | ICD-10-CM | POA: Diagnosis not present

## 2023-01-11 DIAGNOSIS — J9600 Acute respiratory failure, unspecified whether with hypoxia or hypercapnia: Secondary | ICD-10-CM | POA: Diagnosis not present

## 2023-01-11 DIAGNOSIS — R000 Tachycardia, unspecified: Secondary | ICD-10-CM | POA: Diagnosis not present

## 2023-01-11 DIAGNOSIS — R0902 Hypoxemia: Secondary | ICD-10-CM | POA: Diagnosis not present

## 2023-01-11 DIAGNOSIS — I615 Nontraumatic intracerebral hemorrhage, intraventricular: Secondary | ICD-10-CM | POA: Diagnosis not present

## 2023-01-11 DIAGNOSIS — I130 Hypertensive heart and chronic kidney disease with heart failure and stage 1 through stage 4 chronic kidney disease, or unspecified chronic kidney disease: Secondary | ICD-10-CM | POA: Diagnosis present

## 2023-01-11 DIAGNOSIS — Z982 Presence of cerebrospinal fluid drainage device: Secondary | ICD-10-CM | POA: Diagnosis not present

## 2023-01-11 DIAGNOSIS — I5032 Chronic diastolic (congestive) heart failure: Secondary | ICD-10-CM | POA: Diagnosis present

## 2023-01-11 DIAGNOSIS — Z515 Encounter for palliative care: Secondary | ICD-10-CM | POA: Diagnosis not present

## 2023-01-11 DIAGNOSIS — I2510 Atherosclerotic heart disease of native coronary artery without angina pectoris: Secondary | ICD-10-CM | POA: Diagnosis present

## 2023-01-11 DIAGNOSIS — E785 Hyperlipidemia, unspecified: Secondary | ICD-10-CM | POA: Diagnosis present

## 2023-01-11 DIAGNOSIS — M109 Gout, unspecified: Secondary | ICD-10-CM | POA: Diagnosis not present

## 2023-01-11 DIAGNOSIS — N400 Enlarged prostate without lower urinary tract symptoms: Secondary | ICD-10-CM | POA: Diagnosis not present

## 2023-01-11 DIAGNOSIS — N183 Chronic kidney disease, stage 3 unspecified: Secondary | ICD-10-CM | POA: Diagnosis not present

## 2023-01-11 DIAGNOSIS — Z79899 Other long term (current) drug therapy: Secondary | ICD-10-CM | POA: Diagnosis not present

## 2023-01-11 DIAGNOSIS — R0989 Other specified symptoms and signs involving the circulatory and respiratory systems: Secondary | ICD-10-CM | POA: Diagnosis not present

## 2023-01-11 DIAGNOSIS — R29713 NIHSS score 13: Secondary | ICD-10-CM | POA: Diagnosis present

## 2023-01-11 DIAGNOSIS — Z7901 Long term (current) use of anticoagulants: Secondary | ICD-10-CM | POA: Diagnosis not present

## 2023-01-11 DIAGNOSIS — I77810 Thoracic aortic ectasia: Secondary | ICD-10-CM | POA: Diagnosis not present

## 2023-01-11 DIAGNOSIS — G4489 Other headache syndrome: Secondary | ICD-10-CM | POA: Diagnosis not present

## 2023-01-11 DIAGNOSIS — R29898 Other symptoms and signs involving the musculoskeletal system: Secondary | ICD-10-CM | POA: Diagnosis not present

## 2023-01-11 DIAGNOSIS — I129 Hypertensive chronic kidney disease with stage 1 through stage 4 chronic kidney disease, or unspecified chronic kidney disease: Secondary | ICD-10-CM | POA: Diagnosis not present

## 2023-01-11 DIAGNOSIS — Z1152 Encounter for screening for COVID-19: Secondary | ICD-10-CM | POA: Diagnosis not present

## 2023-01-11 DIAGNOSIS — G935 Compression of brain: Secondary | ICD-10-CM | POA: Diagnosis not present

## 2023-01-11 DIAGNOSIS — I629 Nontraumatic intracranial hemorrhage, unspecified: Secondary | ICD-10-CM | POA: Diagnosis not present

## 2023-01-11 DIAGNOSIS — I4891 Unspecified atrial fibrillation: Secondary | ICD-10-CM | POA: Diagnosis not present

## 2023-01-11 DIAGNOSIS — I10 Essential (primary) hypertension: Secondary | ICD-10-CM | POA: Diagnosis not present

## 2023-01-11 DIAGNOSIS — R52 Pain, unspecified: Secondary | ICD-10-CM | POA: Diagnosis not present

## 2023-01-11 NOTE — Telephone Encounter (Signed)
 Patient seen by Dr. Kennyth for LAAO implant 01/08/23. He was scheduled for pre Watchman CT scan 01/22/23 however unable to reach the patient on call yesterday. MyChart message today sent with CT instructions and information to return call or message. Patients stepdaughter returned message stating the patient was hospitalized this morning at [facility] with recurrent hemorrhagic stroke. CT canceled with plans to have family/patient reach to us  if/when LAAO procedure is appropriate.   Kate Minus NP-C Structural Heart Team  Phone: [phone]

## 2023-01-12 DIAGNOSIS — J9811 Atelectasis: Secondary | ICD-10-CM | POA: Diagnosis not present

## 2023-01-12 DIAGNOSIS — G936 Cerebral edema: Secondary | ICD-10-CM | POA: Diagnosis not present

## 2023-01-12 DIAGNOSIS — I610 Nontraumatic intracerebral hemorrhage in hemisphere, subcortical: Secondary | ICD-10-CM | POA: Diagnosis not present

## 2023-01-12 DIAGNOSIS — R918 Other nonspecific abnormal finding of lung field: Secondary | ICD-10-CM | POA: Diagnosis not present

## 2023-01-12 DIAGNOSIS — I77810 Thoracic aortic ectasia: Secondary | ICD-10-CM | POA: Diagnosis not present

## 2023-01-15 ENCOUNTER — Institutional Professional Consult (permissible substitution): Payer: Medicare Other | Admitting: Cardiology

## 2023-01-15 DIAGNOSIS — Z515 Encounter for palliative care: Secondary | ICD-10-CM | POA: Diagnosis not present

## 2023-01-15 DIAGNOSIS — I619 Nontraumatic intracerebral hemorrhage, unspecified: Secondary | ICD-10-CM | POA: Diagnosis not present

## 2023-01-22 ENCOUNTER — Ambulatory Visit (HOSPITAL_COMMUNITY): Payer: Medicare Other

## 2023-02-02 DEATH — deceased

## 2023-02-08 ENCOUNTER — Ambulatory Visit: Payer: Medicare Other | Admitting: Cardiology
